# Patient Record
Sex: Female | Born: 1990
Health system: Southern US, Community
[De-identification: ages and names within clinical notes are randomized; demographics above are authoritative.]

## PROBLEM LIST (undated history)

## (undated) ENCOUNTER — Inpatient Hospital Stay (HOSPITAL_COMMUNITY): Payer: Self-pay

## (undated) ENCOUNTER — Emergency Department (HOSPITAL_COMMUNITY): Payer: Medicaid Other | Source: Home / Self Care

## (undated) DIAGNOSIS — K912 Postsurgical malabsorption, not elsewhere classified: Secondary | ICD-10-CM

## (undated) DIAGNOSIS — G8929 Other chronic pain: Secondary | ICD-10-CM

## (undated) DIAGNOSIS — Z9119 Patient's noncompliance with other medical treatment and regimen: Secondary | ICD-10-CM

## (undated) DIAGNOSIS — Z91199 Patient's noncompliance with other medical treatment and regimen due to unspecified reason: Secondary | ICD-10-CM

## (undated) DIAGNOSIS — R351 Nocturia: Secondary | ICD-10-CM

## (undated) DIAGNOSIS — G44319 Acute post-traumatic headache, not intractable: Secondary | ICD-10-CM

## (undated) DIAGNOSIS — I509 Heart failure, unspecified: Secondary | ICD-10-CM

## (undated) DIAGNOSIS — Z903 Acquired absence of stomach [part of]: Secondary | ICD-10-CM

## (undated) DIAGNOSIS — M549 Dorsalgia, unspecified: Secondary | ICD-10-CM

## (undated) HISTORY — PX: TONSILLECTOMY: SUR1361

## (undated) HISTORY — PX: CHOLECYSTECTOMY: SHX55

## (undated) HISTORY — PX: WISDOM TOOTH EXTRACTION: SHX21

## (undated) HISTORY — DX: Acute post-traumatic headache, not intractable: G44.319

## (undated) HISTORY — PX: BREAST ENHANCEMENT SURGERY: SHX7

## (undated) HISTORY — PX: TEAR DUCT PROBING: SHX793

---
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
## 2009-03-07 ENCOUNTER — Emergency Department (HOSPITAL_COMMUNITY): Admission: EM | Admit: 2009-03-07 | Discharge: 2009-03-07 | Payer: Self-pay | Admitting: Emergency Medicine

## 2009-06-28 ENCOUNTER — Ambulatory Visit: Payer: Self-pay | Admitting: Family Medicine

## 2009-07-04 ENCOUNTER — Ambulatory Visit (HOSPITAL_COMMUNITY): Admission: RE | Admit: 2009-07-04 | Discharge: 2009-07-04 | Payer: Self-pay | Admitting: Surgery

## 2009-07-04 ENCOUNTER — Encounter (INDEPENDENT_AMBULATORY_CARE_PROVIDER_SITE_OTHER): Payer: Self-pay | Admitting: Surgery

## 2009-08-01 ENCOUNTER — Encounter (INDEPENDENT_AMBULATORY_CARE_PROVIDER_SITE_OTHER): Payer: Self-pay | Admitting: Internal Medicine

## 2009-08-01 ENCOUNTER — Ambulatory Visit: Payer: Self-pay | Admitting: Family Medicine

## 2009-08-01 LAB — CONVERTED CEMR LAB: Chlamydia, DNA Probe: NEGATIVE

## 2009-11-16 ENCOUNTER — Emergency Department (HOSPITAL_COMMUNITY): Admission: EM | Admit: 2009-11-16 | Discharge: 2009-11-17 | Payer: Self-pay | Admitting: Emergency Medicine

## 2009-12-14 DIAGNOSIS — I509 Heart failure, unspecified: Secondary | ICD-10-CM | POA: Insufficient documentation

## 2009-12-14 HISTORY — DX: Heart failure, unspecified: I50.9

## 2010-01-01 ENCOUNTER — Emergency Department (HOSPITAL_COMMUNITY): Admission: EM | Admit: 2010-01-01 | Discharge: 2010-01-02 | Payer: Self-pay | Admitting: Emergency Medicine

## 2010-07-07 ENCOUNTER — Emergency Department (HOSPITAL_COMMUNITY): Admission: EM | Admit: 2010-07-07 | Discharge: 2010-07-07 | Payer: Self-pay | Admitting: Emergency Medicine

## 2010-08-12 ENCOUNTER — Emergency Department (HOSPITAL_COMMUNITY): Admission: EM | Admit: 2010-08-12 | Discharge: 2010-08-13 | Payer: Self-pay | Admitting: Emergency Medicine

## 2010-11-12 ENCOUNTER — Emergency Department (HOSPITAL_BASED_OUTPATIENT_CLINIC_OR_DEPARTMENT_OTHER): Admission: EM | Admit: 2010-11-12 | Discharge: 2010-11-12 | Payer: Self-pay | Admitting: Emergency Medicine

## 2010-11-12 ENCOUNTER — Ambulatory Visit: Payer: Self-pay | Admitting: Interventional Radiology

## 2011-01-28 ENCOUNTER — Emergency Department (HOSPITAL_COMMUNITY): Payer: Self-pay

## 2011-01-28 ENCOUNTER — Emergency Department (HOSPITAL_COMMUNITY)
Admission: EM | Admit: 2011-01-28 | Discharge: 2011-01-28 | Discharge status: Against Medical Advice | Payer: Self-pay | Attending: Emergency Medicine | Admitting: Emergency Medicine

## 2011-01-28 ENCOUNTER — Encounter (HOSPITAL_COMMUNITY): Payer: Self-pay | Admitting: Radiology

## 2011-01-28 DIAGNOSIS — M7989 Other specified soft tissue disorders: Secondary | ICD-10-CM | POA: Insufficient documentation

## 2011-01-28 DIAGNOSIS — R0602 Shortness of breath: Secondary | ICD-10-CM | POA: Insufficient documentation

## 2011-01-28 DIAGNOSIS — R0789 Other chest pain: Secondary | ICD-10-CM | POA: Insufficient documentation

## 2011-01-28 DIAGNOSIS — J45909 Unspecified asthma, uncomplicated: Secondary | ICD-10-CM | POA: Insufficient documentation

## 2011-01-28 DIAGNOSIS — I509 Heart failure, unspecified: Secondary | ICD-10-CM | POA: Insufficient documentation

## 2011-01-28 LAB — POCT I-STAT, CHEM 8
Creatinine, Ser: 0.7 mg/dL (ref 0.4–1.2)
Glucose, Bld: 87 mg/dL (ref 70–99)
HCT: 40 % (ref 36.0–46.0)
Hemoglobin: 13.6 g/dL (ref 12.0–15.0)
Potassium: 3.4 mEq/L — ABNORMAL LOW (ref 3.5–5.1)
Sodium: 142 mEq/L (ref 135–145)
TCO2: 24 mmol/L (ref 0–100)

## 2011-01-28 LAB — BRAIN NATRIURETIC PEPTIDE: Pro B Natriuretic peptide (BNP): <30 pg/mL (ref 0.0–100.0)

## 2011-01-29 ENCOUNTER — Emergency Department (HOSPITAL_BASED_OUTPATIENT_CLINIC_OR_DEPARTMENT_OTHER)
Admission: EM | Admit: 2011-01-29 | Discharge: 2011-01-29 | Disposition: A | Discharge status: Home / Self Care | Payer: Self-pay | Attending: Emergency Medicine | Admitting: Emergency Medicine

## 2011-01-29 ENCOUNTER — Emergency Department (INDEPENDENT_AMBULATORY_CARE_PROVIDER_SITE_OTHER): Payer: Self-pay

## 2011-01-29 DIAGNOSIS — R0602 Shortness of breath: Secondary | ICD-10-CM | POA: Insufficient documentation

## 2011-01-29 DIAGNOSIS — J45909 Unspecified asthma, uncomplicated: Secondary | ICD-10-CM | POA: Insufficient documentation

## 2011-01-29 DIAGNOSIS — R071 Chest pain on breathing: Secondary | ICD-10-CM

## 2011-01-29 DIAGNOSIS — R079 Chest pain, unspecified: Secondary | ICD-10-CM | POA: Insufficient documentation

## 2011-01-29 DIAGNOSIS — R51 Headache: Secondary | ICD-10-CM | POA: Insufficient documentation

## 2011-01-29 DIAGNOSIS — I509 Heart failure, unspecified: Secondary | ICD-10-CM | POA: Insufficient documentation

## 2011-01-29 LAB — PREGNANCY, URINE: Preg Test, Ur: NEGATIVE

## 2011-01-29 MED ORDER — IOHEXOL 350 MG/ML SOLN
80.0000 mL | Freq: Once | INTRAVENOUS | Status: AC | PRN
  Administered 2011-01-29: 80 mL via INTRAVENOUS

## 2011-02-06 ENCOUNTER — Emergency Department (HOSPITAL_BASED_OUTPATIENT_CLINIC_OR_DEPARTMENT_OTHER)
Admission: EM | Admit: 2011-02-06 | Discharge: 2011-02-06 | Disposition: A | Discharge status: Home / Self Care | Payer: Self-pay | Attending: Emergency Medicine | Admitting: Emergency Medicine

## 2011-02-06 DIAGNOSIS — I509 Heart failure, unspecified: Secondary | ICD-10-CM | POA: Insufficient documentation

## 2011-02-06 DIAGNOSIS — N390 Urinary tract infection, site not specified: Secondary | ICD-10-CM | POA: Insufficient documentation

## 2011-02-06 DIAGNOSIS — M545 Low back pain, unspecified: Secondary | ICD-10-CM | POA: Insufficient documentation

## 2011-02-06 DIAGNOSIS — Z79899 Other long term (current) drug therapy: Secondary | ICD-10-CM | POA: Insufficient documentation

## 2011-02-06 DIAGNOSIS — J45909 Unspecified asthma, uncomplicated: Secondary | ICD-10-CM | POA: Insufficient documentation

## 2011-02-06 LAB — URINE MICROSCOPIC-ADD ON

## 2011-02-06 LAB — URINALYSIS, ROUTINE W REFLEX MICROSCOPIC
Bilirubin Urine: NEGATIVE
Specific Gravity, Urine: 1.013 (ref 1.005–1.030)
Urine Glucose, Fasting: NEGATIVE mg/dL
Urobilinogen, UA: 1 mg/dL (ref 0.0–1.0)

## 2011-02-25 LAB — BASIC METABOLIC PANEL
Calcium: 8.5 mg/dL (ref 8.4–10.5)
Creatinine, Ser: 0.5 mg/dL (ref 0.4–1.2)
GFR calc Af Amer: >60 mL/min (ref >60)
GFR calc non Af Amer: >60 mL/min (ref >60)
Sodium: 143 mEq/L (ref 135–145)

## 2011-02-25 LAB — DIFFERENTIAL
Eosinophils Absolute: 0.1 10*3/uL (ref 0.0–0.7)
Lymphocytes Relative: 28 % (ref 12–46)
Lymphs Abs: 2.4 10*3/uL (ref 0.7–4.0)
Monocytes Relative: 7 % (ref 3–12)
Neutro Abs: 5.5 10*3/uL (ref 1.7–7.7)
Neutrophils Relative %: 64 % (ref 43–77)

## 2011-02-25 LAB — CBC
Hemoglobin: 12.5 g/dL (ref 12.0–15.0)
Platelets: 272 10*3/uL (ref 150–400)
RBC: 4.22 MIL/uL (ref 3.87–5.11)
WBC: 8.6 10*3/uL (ref 4.0–10.5)

## 2011-02-25 LAB — D-DIMER, QUANTITATIVE: D-Dimer, Quant: 0.26 ug/mL-FEU (ref 0.00–0.48)

## 2011-03-01 LAB — URINALYSIS, ROUTINE W REFLEX MICROSCOPIC
Ketones, ur: 15 mg/dL — AB
Nitrite: NEGATIVE
pH: 6 (ref 5.0–8.0)

## 2011-03-01 LAB — POCT I-STAT, CHEM 8
Calcium, Ion: 1.08 mmol/L — ABNORMAL LOW (ref 1.12–1.32)
Chloride: 108 mEq/L (ref 96–112)
HCT: 34 % — ABNORMAL LOW (ref 36.0–46.0)
Sodium: 137 mEq/L (ref 135–145)
TCO2: 20 mmol/L (ref 0–100)

## 2011-03-17 LAB — COMPREHENSIVE METABOLIC PANEL WITH GFR
ALT: 16 U/L (ref 0–35)
AST: 17 U/L (ref 0–37)
Albumin: 3.1 g/dL — ABNORMAL LOW (ref 3.5–5.2)
Alkaline Phosphatase: 58 U/L (ref 39–117)
BUN: 4 mg/dL — ABNORMAL LOW (ref 6–23)
CO2: 21 meq/L (ref 19–32)
Calcium: 8.7 mg/dL (ref 8.4–10.5)
Chloride: 109 meq/L (ref 96–112)
Creatinine, Ser: 0.42 mg/dL (ref 0.4–1.2)
GFR calc non Af Amer: >60 mL/min
Glucose, Bld: 96 mg/dL (ref 70–99)
Potassium: 3.8 meq/L (ref 3.5–5.1)
Sodium: 137 meq/L (ref 135–145)
Total Bilirubin: 0.6 mg/dL (ref 0.3–1.2)
Total Protein: 6 g/dL (ref 6.0–8.3)

## 2011-03-17 LAB — SAMPLE TO BLOOD BANK

## 2011-03-17 LAB — URINALYSIS, ROUTINE W REFLEX MICROSCOPIC
Ketones, ur: NEGATIVE mg/dL
Leukocytes, UA: NEGATIVE
Nitrite: NEGATIVE
Protein, ur: NEGATIVE mg/dL
pH: 7.5 (ref 5.0–8.0)

## 2011-03-17 LAB — DIFFERENTIAL
Basophils Absolute: 0 10*3/uL (ref 0.0–0.1)
Basophils Relative: 0 % (ref 0–1)
Eosinophils Absolute: 0.1 10*3/uL (ref 0.0–0.7)
Eosinophils Relative: 1 % (ref 0–5)
Monocytes Absolute: 0.8 10*3/uL (ref 0.1–1.0)
Monocytes Relative: 8 % (ref 3–12)
Neutro Abs: 6.6 10*3/uL (ref 1.7–7.7)

## 2011-03-17 LAB — CBC
MCHC: 34.5 g/dL (ref 30.0–36.0)
MCV: 91.8 fL (ref 78.0–100.0)
Platelets: 247 10*3/uL (ref 150–400)

## 2011-03-17 LAB — ABO/RH: ABO/RH(D): A POS

## 2011-03-22 LAB — PREGNANCY, URINE: Preg Test, Ur: NEGATIVE

## 2011-03-22 LAB — HEMOGLOBIN AND HEMATOCRIT, BLOOD: HCT: 41.3 % (ref 36.0–46.0)

## 2011-03-26 LAB — URINALYSIS, ROUTINE W REFLEX MICROSCOPIC
Bilirubin Urine: NEGATIVE
Nitrite: NEGATIVE
Specific Gravity, Urine: 1.029 (ref 1.005–1.030)
Urobilinogen, UA: 1 mg/dL (ref 0.0–1.0)
pH: 7 (ref 5.0–8.0)

## 2011-03-26 LAB — CBC
HCT: 39.9 % (ref 36.0–46.0)
Hemoglobin: 13.8 g/dL (ref 12.0–15.0)
MCV: 89.8 fL (ref 78.0–100.0)
WBC: 12 10*3/uL — ABNORMAL HIGH (ref 4.0–10.5)

## 2011-03-26 LAB — HEPATIC FUNCTION PANEL
ALT: 16 U/L (ref 0–35)
AST: 16 U/L (ref 0–37)
Alkaline Phosphatase: 76 U/L (ref 39–117)
Bilirubin, Direct: 0.2 mg/dL (ref 0.0–0.3)
Total Bilirubin: 0.5 mg/dL (ref 0.3–1.2)

## 2011-03-26 LAB — DIFFERENTIAL
Eosinophils Relative: 1 % (ref 0–5)
Lymphocytes Relative: 20 % (ref 12–46)
Lymphs Abs: 2.4 10*3/uL (ref 0.7–4.0)
Monocytes Absolute: 0.9 10*3/uL (ref 0.1–1.0)

## 2011-03-26 LAB — RAPID URINE DRUG SCREEN, HOSP PERFORMED
Amphetamines: NOT DETECTED
Barbiturates: NOT DETECTED
Tetrahydrocannabinol: NOT DETECTED

## 2011-03-26 LAB — BASIC METABOLIC PANEL
Chloride: 107 mEq/L (ref 96–112)
GFR calc non Af Amer: >60 mL/min (ref >60)
Glucose, Bld: 108 mg/dL — ABNORMAL HIGH (ref 70–99)
Potassium: 3.7 mEq/L (ref 3.5–5.1)
Sodium: 138 mEq/L (ref 135–145)

## 2011-04-28 NOTE — Op Note (Signed)
[name], Brenda Spencer                ACCOUNT NO.:  [id number]   MEDICAL RECORD NO.:  [id number]          PATIENT TYPE:  AMB   LOCATION:  DSC                          FACILITY:  [facility]   PHYSICIAN:  David L. Annalee Genta, M.D.DATE OF BIRTH:  01-12-1991   DATE OF PROCEDURE:  12/31/2008  DATE OF DISCHARGE:                               OPERATIVE REPORT   PREOPERATIVE DIAGNOSES:  1. Recurrent tonsillitis.  2. Adenotonsillar hypertrophy.   POSTOPERATIVE DIAGNOSES:  1. Recurrent tonsillitis.  2. Adenotonsillar hypertrophy.   INDICATIONS FOR SURGERY:  1. Recurrent tonsillitis.  2. Adenotonsillar hypertrophy.   SURGICAL PROCEDURES:  Tonsillectomy and adenoidectomy.   SURGEON:  Kinnie Scales. Annalee Genta, MD   ANESTHESIA:  General endotracheal.   COMPLICATIONS:  None.   ESTIMATED BLOOD LOSS:  Minimal.   DISPOSITION:  The patient was transferred from the operating room to the  recovery room in stable condition.   BRIEF HISTORY:  The patient is an almost 20 year old white female who is  referred for evaluation of recurrent tonsillitis and adenotonsillar  hypertrophy.  The patient has had multiple episodes of infection  requiring antibiotic therapy and has tonsil discharge and sore throat.  Examination in the office revealed 3+ cryptic tonsils, and given her  history and examination, I recommended the above surgical procedures.  The risks, benefits, and possible complications of the procedures were  discussed in detail with the patient and her mother.  They understood  and concurred to our plan for surgery, which is scheduled as an  outpatient under general anesthesia on December 31, 2008.   PROCEDURE:  The patient was brought to the operating room at [facility], placed in a supine position on the  operating table.  General endotracheal anesthesia was established  without difficulty.  The patient was positioned on the operating table,  prepped and draped in a  sterile fashion.  A Crowe-Davis mouthgag was  inserted out difficulty, and the oral cavity and oropharynx were  examined.  There were no loose or broken teeth, and the hard and soft  palate were intact.  Procedure was begun with adenoid ablation using  Bovie suction cautery set at 45 watts.  Adenoid tissue was completely  ablated creating a widely patent nasopharynx.  Attention was then turned  to the tonsils.  Dissecting in a subcapsular fashion, the left tonsil  was resected using Bovie electrocautery and dissecting the subcapsular  fascia from superior pole to tongue base.  Tonsil tissue was sent to  pathology.  Right tonsil was removed in a similar fashion.  There was no  bleeding.  The tonsillar fossae were gently debrided with a dry tonsil  sponge and several small areas of point hemorrhage were cauterized with  suction cautery.  Crowe-Davis mouthgag was released and re-applied.  There was no active bleeding.  An orogastric tube was passed.  The  stomach contents were aspirated.  Nasal cavity, nasopharynx, oral  cavity, and oropharynx were irrigated and suctioned.  The patient's  Crowe-Davis mouthgag was released and removed.  No loose or broken  teeth.  No bleeding.  She was extubated and transferred from the  operating room to the recovery room in stable condition.  There were no  complications.  The blood loss was minimal.           ______________________________  Kinnie Scales. Annalee Genta, M.D.     DLS/MEDQ  D:  16/09/9603  T:  12/31/2008  Job:  [id number]

## 2011-04-28 NOTE — Op Note (Signed)
Brenda Spencer, Brenda Spencer                ACCOUNT NO.:  [id number]   MEDICAL RECORD NO.:  [id number]          PATIENT TYPE:  AMB   LOCATION:  DAY                          FACILITY:  [facility]   PHYSICIAN:  Wilmon Arms. Corliss Skains, M.D. DATE OF BIRTH:  25-Aug-1991   DATE OF PROCEDURE:  07/04/2009  DATE OF DISCHARGE:                               OPERATIVE REPORT   PREOPERATIVE DIAGNOSIS:  Chronic calculus cholecystitis.   POSTOPERATIVE DIAGNOSIS:  Chronic calculus cholecystitis.   PROCEDURE PERFORMED:  Laparoscopic cholecystectomy with intraoperative  cholangiogram.   SURGEON:  Wilmon Arms. Tsuei, M.D.   ANESTHESIA:  General   INDICATIONS:  This is a 20 year old female who presents with a 45-month  history of intermittent right upper quadrant pain radiating through to  her back associated with nausea and vomiting.  A workup included liver  function tests which were normal.  Ultrasound showed gallstones, but no  sign of cholecystitis or common bile duct dilatation.  She presents now  for elective cholecystectomy.   DESCRIPTION OF PROCEDURE:  The patient was brought to the operating room  and placed supine position on the operating room table.  After an  adequate level of general anesthesia was obtained the patient's abdomen  was prepped with Betadine and draped in sterile fashion.  A time-out was  taken to ensure the proper patient and the proper procedure.  The area  below her umbilicus was infiltrated with 0.25% Marcaine with  epinephrine.  A transverse incision was made.  Dissection was carried  down through a considerable amount of adipose tissue to the fascia.  The  fascia was opened vertically.  We entered the peritoneal cavity bluntly.  A stay suture of zero Vicryl was placed around the fascial opening.  Pneumoperitoneum was obtained by insufflating CO2 and maintaining  maximal pressure of 15 mmHg.  The patient was positioned in reverse  Trendelenburg and tilted to her left.  An 11 mm port was  placed in the  subxiphoid position.  Two 5 mm ports were placed in the right upper  quadrant.  The liver appeared grossly normal.  The gallbladder was  mildly thickened and was very long and thin.  The fundus of the  gallbladder was grasped with a clamp and elevated.  We opened the  peritoneum around the hilum of the gallbladder.  We bluntly dissected  around the cystic duct.  The cystic duct appeared rather dilated.  We  were very meticulous in our dissection and were able to visualize the  cystic duct and cystic artery leaving the gallbladder.  Once we were  confident that this was indeed the cystic duct we ligated it with a clip  distally.  A small opening was created on the cystic duct.  A Cook  cholangiogram catheter was inserted through a stab incision threaded  into the cystic duct.  A cholangiogram was then obtained which showed  good flow proximally and distally to the biliary tree with no sign of  filling defect.  Contrast flowed easily into the duodenum.  The catheter  was removed.  The cystic duct was ligated with  two clips and a zero  Vicryl Endoloop.  The duct was divided and the cystic artery was also  ligated with clips and divided.  Cautery was then used to remove the  gallbladder from the liver.  A small opening was inadvertently made in  the very top of the fundus of the gallbladder, but no stones were  spilled.  The gallbladder was placed in an EndoCatch sac.  We irrigated  the gallbladder fossa.  Hemostasis was obtained with cautery.  We  suctioned out the irrigation.  The gallbladder was then removed through  the umbilical port site.  The fascia was closed with the  pursestring suture.  Pneumoperitoneum was then released as we removed  the trocars.  The 4-0 Monocryl was used to close the skin incisions.  Steri-Strips and clean dressings were applied.  The patient was then  extubated and brought to recovery in stable condition.  All sponge,  instrument and needle  counts were correct.      Wilmon Arms. Tsuei, M.D.  Electronically Signed     MKT/MEDQ  D:  07/04/2009  T:  07/04/2009  Job:  [id number]

## 2011-09-30 ENCOUNTER — Emergency Department (HOSPITAL_COMMUNITY): Payer: Self-pay

## 2011-09-30 ENCOUNTER — Emergency Department (HOSPITAL_COMMUNITY)
Admission: EM | Admit: 2011-09-30 | Discharge: 2011-09-30 | Disposition: A | Discharge status: Home / Self Care | Payer: Self-pay | Attending: Emergency Medicine | Admitting: Emergency Medicine

## 2011-09-30 DIAGNOSIS — I509 Heart failure, unspecified: Secondary | ICD-10-CM | POA: Insufficient documentation

## 2011-09-30 DIAGNOSIS — J45909 Unspecified asthma, uncomplicated: Secondary | ICD-10-CM | POA: Insufficient documentation

## 2011-09-30 DIAGNOSIS — R112 Nausea with vomiting, unspecified: Secondary | ICD-10-CM | POA: Insufficient documentation

## 2011-09-30 DIAGNOSIS — G43909 Migraine, unspecified, not intractable, without status migrainosus: Secondary | ICD-10-CM | POA: Insufficient documentation

## 2011-09-30 DIAGNOSIS — H53149 Visual discomfort, unspecified: Secondary | ICD-10-CM | POA: Insufficient documentation

## 2011-11-17 ENCOUNTER — Encounter (HOSPITAL_BASED_OUTPATIENT_CLINIC_OR_DEPARTMENT_OTHER): Payer: Self-pay | Admitting: *Deleted

## 2011-11-17 ENCOUNTER — Emergency Department (HOSPITAL_BASED_OUTPATIENT_CLINIC_OR_DEPARTMENT_OTHER)
Admission: EM | Admit: 2011-11-17 | Discharge: 2011-11-17 | Disposition: A | Discharge status: Home / Self Care | Payer: Self-pay | Attending: Emergency Medicine | Admitting: Emergency Medicine

## 2011-11-17 DIAGNOSIS — I509 Heart failure, unspecified: Secondary | ICD-10-CM | POA: Insufficient documentation

## 2011-11-17 DIAGNOSIS — J069 Acute upper respiratory infection, unspecified: Secondary | ICD-10-CM | POA: Insufficient documentation

## 2011-11-17 DIAGNOSIS — J45909 Unspecified asthma, uncomplicated: Secondary | ICD-10-CM | POA: Insufficient documentation

## 2011-11-17 DIAGNOSIS — J40 Bronchitis, not specified as acute or chronic: Secondary | ICD-10-CM | POA: Insufficient documentation

## 2011-11-17 HISTORY — DX: Heart failure, unspecified: I50.9

## 2011-11-17 MED ORDER — AZITHROMYCIN 250 MG PO TABS: 250.0000 mg | ORAL_TABLET | Freq: Every day | ORAL | Status: AC

## 2011-11-17 MED ORDER — ALBUTEROL SULFATE HFA 108 (90 BASE) MCG/ACT IN AERS
INHALATION_SPRAY | RESPIRATORY_TRACT | Status: AC
  Filled 2011-11-17: qty 6.7

## 2011-11-17 MED ORDER — OXYCODONE-ACETAMINOPHEN 5-325 MG PO TABS
1.0000 | ORAL_TABLET | Freq: Once | ORAL | Status: AC
  Administered 2011-11-17: 1 via ORAL
  Filled 2011-11-17: qty 1

## 2011-11-17 MED ORDER — ALBUTEROL SULFATE HFA 108 (90 BASE) MCG/ACT IN AERS
2.0000 | INHALATION_SPRAY | RESPIRATORY_TRACT | Status: DC
  Administered 2011-11-17: 10:00:00 via RESPIRATORY_TRACT

## 2011-11-17 NOTE — ED Provider Notes (Signed)
History     CSN: [id number] Arrival date & time: 11/17/2011  9:02 AM   First MD Initiated Contact with Patient 11/17/11 [phone]      Chief Complaint  Patient presents with  . Cough    (Consider location/radiation/quality/duration/timing/severity/associated sxs/prior treatment) Patient is a 20 y.o. female presenting with cough. The history is provided by the patient.  Cough   the patient reports 24 hours of productive cough without shortness of breath.  She reports headache and nasal congestion.  She's had recent sick contacts.  Today she had an episode of posttussive emesis.  She has no nausea at this time.  She does report 3 weeks of loose stools.  She has not had a recent travel outside the [place] or recent antibiotics.  She does not smoke cigarettes.  She's had no orthopnea or dyspnea on exertion.  Does have a history of asthma.  She reports this feels slightly different than her asthma.  She has an albuterol inhaler which has not been using it.  Nothing worsens her symptoms.  Nothing improves her symptoms the  Past Medical History  Diagnosis Date  . CHF (congestive heart failure)   . Congestive heart failure 2011    related to preeclampsia  . Asthma     Past Surgical History  Procedure Date  . Tonsillectomy   . Wisdom tooth extraction   . Tear duct probing   . Cholecystectomy     No family history on file.  History  Substance Use Topics  . Smoking status: Never Smoker   . Smokeless tobacco: Not on file  . Alcohol Use: No    OB History    Grav Para Term Preterm Abortions TAB SAB Ect Mult Living                  Review of Systems  Respiratory: Positive for cough.   All other systems reviewed and are negative.    Allergies  Review of patient's allergies indicates no known allergies.  Home Medications   Current Outpatient Rx  Name Route Sig Dispense Refill  . ALBUTEROL SULFATE HFA 108 (90 BASE) MCG/ACT IN AERS Inhalation Inhale 2 puffs into the lungs  every 6 (six) hours as needed.        BP 127/84  Pulse 124  Temp(Src) 98.6 F (37 C) (Oral)  Resp 20  Ht 5\' 1"  (1.549 m)  Wt 180 lb (81.647 kg)  BMI 34.01 kg/m2  SpO2 100%  LMP 11/11/2011  Physical Exam  Nursing note and vitals reviewed. Constitutional: She is oriented to person, place, and time. She appears well-developed and well-nourished. No distress.  HENT:  Head: Normocephalic and atraumatic.  Eyes: EOM are normal.  Neck: Normal range of motion.  Cardiovascular: Normal rate, regular rhythm and normal heart sounds.   Pulmonary/Chest: Effort normal and breath sounds normal.  Abdominal: Soft. She exhibits no distension. There is no tenderness.  Musculoskeletal: Normal range of motion.  Neurological: She is alert and oriented to person, place, and time.  Skin: Skin is warm and dry.  Psychiatric: She has a normal mood and affect. Judgment normal.    ED Course  Procedures (including critical care time)  Labs Reviewed - No data to display No results found.   1. Upper respiratory tract infection   2. Bronchitis       MDM  Symptoms sound consistent with an upper respiratory tract infection/bronchitis.  Will treat with a Z-Pak as well as with MDI albuterol for the  cough.  She has no abdominal pain at this time.  Her episode of vomiting appears to be posttussive emesis.  DC home with close followup with her primary care doctor.  She has no vaginal complaints or lower abdominal pain.  She's requested a serum pregnancy test today. there is not any indication for this in the emergency department today and she will be referred to that her store to obtain one herself.   9:24 AM On my examination her pulse rate is 104.  Her abdomen is benign.  The lungs are clear.  My suspicion for pulmonary embolism is very low.  I suspect this is mild volume depletion      Lyanne Co, MD 11/17/11 [phone]

## 2011-11-17 NOTE — ED Notes (Signed)
Patient states she developed a cough yesterday and is now coughing so hard she is vomiting , requesting a pregnancy test and is having clear diarrhea for the last 3 weeks.  Appetite is normal.

## 2012-02-29 ENCOUNTER — Encounter (HOSPITAL_BASED_OUTPATIENT_CLINIC_OR_DEPARTMENT_OTHER): Payer: Self-pay | Admitting: *Deleted

## 2012-02-29 ENCOUNTER — Encounter (HOSPITAL_COMMUNITY): Payer: Self-pay

## 2012-02-29 ENCOUNTER — Emergency Department (HOSPITAL_BASED_OUTPATIENT_CLINIC_OR_DEPARTMENT_OTHER)
Admission: EM | Admit: 2012-02-29 | Discharge: 2012-02-29 | Disposition: A | Discharge status: Home / Self Care | Payer: Medicaid Other | Attending: Emergency Medicine | Admitting: Emergency Medicine

## 2012-02-29 ENCOUNTER — Inpatient Hospital Stay (HOSPITAL_COMMUNITY): Payer: Medicaid Other

## 2012-02-29 ENCOUNTER — Inpatient Hospital Stay (HOSPITAL_COMMUNITY)
Admission: AD | Admit: 2012-02-29 | Discharge: 2012-02-29 | Disposition: A | Discharge status: Home / Self Care | Payer: Medicaid Other | Source: Ambulatory Visit | Attending: Obstetrics & Gynecology | Admitting: Obstetrics & Gynecology

## 2012-02-29 DIAGNOSIS — R1110 Vomiting, unspecified: Secondary | ICD-10-CM

## 2012-02-29 DIAGNOSIS — O219 Vomiting of pregnancy, unspecified: Secondary | ICD-10-CM

## 2012-02-29 DIAGNOSIS — R10819 Abdominal tenderness, unspecified site: Secondary | ICD-10-CM | POA: Insufficient documentation

## 2012-02-29 DIAGNOSIS — O210 Mild hyperemesis gravidarum: Secondary | ICD-10-CM | POA: Insufficient documentation

## 2012-02-29 DIAGNOSIS — O2390 Unspecified genitourinary tract infection in pregnancy, unspecified trimester: Secondary | ICD-10-CM | POA: Insufficient documentation

## 2012-02-29 DIAGNOSIS — N390 Urinary tract infection, site not specified: Secondary | ICD-10-CM | POA: Insufficient documentation

## 2012-02-29 DIAGNOSIS — Z331 Pregnant state, incidental: Secondary | ICD-10-CM

## 2012-02-29 LAB — CBC
MCH: 27.9 pg (ref 26.0–34.0)
MCV: 86.4 fL (ref 78.0–100.0)
Platelets: 281 10*3/uL (ref 150–400)
RDW: 14.2 % (ref 11.5–15.5)

## 2012-02-29 LAB — URINALYSIS, ROUTINE W REFLEX MICROSCOPIC
Bilirubin Urine: NEGATIVE
Glucose, UA: NEGATIVE mg/dL
Hgb urine dipstick: NEGATIVE
Ketones, ur: NEGATIVE mg/dL
pH: 6 (ref 5.0–8.0)

## 2012-02-29 LAB — URINE MICROSCOPIC-ADD ON

## 2012-02-29 MED ORDER — ONDANSETRON HCL 4 MG PO TABS: 4.0000 mg | ORAL_TABLET | Freq: Four times a day (QID) | ORAL | Status: AC

## 2012-02-29 MED ORDER — NITROFURANTOIN MONOHYD MACRO 100 MG PO CAPS: 100.0000 mg | ORAL_CAPSULE | Freq: Two times a day (BID) | ORAL | Status: AC

## 2012-02-29 MED ORDER — PRENATAL COMPLETE 14-0.4 MG PO TABS: 1.0000 | ORAL_TABLET | Freq: Every day | ORAL | Status: DC

## 2012-02-29 MED ORDER — SODIUM CHLORIDE 0.9 % IV BOLUS (SEPSIS)
1000.0000 mL | Freq: Once | INTRAVENOUS | Status: AC
  Administered 2012-02-29: 1000 mL via INTRAVENOUS

## 2012-02-29 MED ORDER — ONDANSETRON HCL 4 MG/2ML IJ SOLN
4.0000 mg | Freq: Once | INTRAMUSCULAR | Status: AC
  Administered 2012-02-29: 4 mg via INTRAVENOUS
  Filled 2012-02-29: qty 2

## 2012-02-29 MED ORDER — PANTOPRAZOLE SODIUM 40 MG IV SOLR
40.0000 mg | Freq: Once | INTRAVENOUS | Status: AC
  Administered 2012-02-29: 40 mg via INTRAVENOUS
  Filled 2012-02-29: qty 40

## 2012-02-29 NOTE — ED Notes (Signed)
Pt reports her daughter has had GI bug with vomiting and diarrhea and pt woke up this am with one episode of nausea and vomiting at 0300 this am no more emesis

## 2012-02-29 NOTE — Discharge Instructions (Signed)
Nausea and Vomiting Nausea is a sick feeling that often comes before throwing up (vomiting). Vomiting is a reflex where stomach contents come out of your mouth. Vomiting can cause severe loss of body fluids (dehydration). Children and elderly adults can become dehydrated quickly, especially if they also have diarrhea. Nausea and vomiting are symptoms of a condition or disease. It is important to find the cause of your symptoms. CAUSES   Direct irritation of the stomach lining. This irritation can result from increased acid production (gastroesophageal reflux disease), infection, food poisoning, taking certain medicines (such as nonsteroidal anti-inflammatory drugs), alcohol use, or tobacco use.   Signals from the brain.These signals could be caused by a headache, heat exposure, an inner ear disturbance, increased pressure in the brain from injury, infection, a tumor, or a concussion, pain, emotional stimulus, or metabolic problems.   An obstruction in the gastrointestinal tract (bowel obstruction).   Illnesses such as diabetes, hepatitis, gallbladder problems, appendicitis, kidney problems, cancer, sepsis, atypical symptoms of a heart attack, or eating disorders.   Medical treatments such as chemotherapy and radiation.   Receiving medicine that makes you sleep (general anesthetic) during surgery.  DIAGNOSIS Your caregiver may ask for tests to be done if the problems do not improve after a few days. Tests may also be done if symptoms are severe or if the reason for the nausea and vomiting is not clear. Tests may include:  Urine tests.   Blood tests.   Stool tests.   Cultures (to look for evidence of infection).   X-rays or other imaging studies.  Test results can help your caregiver make decisions about treatment or the need for additional tests. TREATMENT You need to stay well hydrated. Drink frequently but in small amounts.You may wish to drink water, sports drinks, clear broth, or  eat frozen ice pops or gelatin dessert to help stay hydrated.When you eat, eating slowly may help prevent nausea.There are also some antinausea medicines that may help prevent nausea. HOME CARE INSTRUCTIONS   Take all medicine as directed by your caregiver.   If you do not have an appetite, do not force yourself to eat. However, you must continue to drink fluids.   If you have an appetite, eat a normal diet unless your caregiver tells you differently.   Eat a variety of complex carbohydrates (rice, wheat, potatoes, bread), lean meats, yogurt, fruits, and vegetables.   Avoid high-fat foods because they are more difficult to digest.   Drink enough water and fluids to keep your urine clear or pale yellow.   If you are dehydrated, ask your caregiver for specific rehydration instructions. Signs of dehydration may include:   Severe thirst.   Dry lips and mouth.   Dizziness.   Dark urine.   Decreasing urine frequency and amount.   Confusion.   Rapid breathing or pulse.  SEEK IMMEDIATE MEDICAL CARE IF:   You have blood or brown flecks (like coffee grounds) in your vomit.   You have black or bloody stools.   You have a severe headache or stiff neck.   You are confused.   You have severe abdominal pain.   You have chest pain or trouble breathing.   You do not urinate at least once every 8 hours.   You develop cold or clammy skin.   You continue to vomit for longer than 24 to 48 hours.   You have a fever.  MAKE SURE YOU:   Understand these instructions.   Will watch your  condition.   Will get help right away if you are not doing well or get worse.  Document Released: 11/30/2005 Document Revised: 11/19/2011 Document Reviewed: 04/29/2011 [facility] Patient Information 2012 [place], [place].Pregnancy, First Trimester The first trimester is the first 3 months your baby is growing inside you. It is important to follow your doctor's instructions. HOME CARE   Do not smoke.    Do not drink alcohol.   Only take medicine as told by your doctor.   Exercise.   Eat healthy foods. Eat regular, well-balanced meals.   You can have sex (intercourse) if there are no other problems with the pregnancy.   Things that help with morning sickness:   Eat soda crackers before getting up in the morning.   Eat 4 to 5 small meals rather than 3 large meals.   Drink liquids between meals, not during meals.   Go to all appointments as told.   Take all vitamins or supplements as told by your doctor.  GET HELP RIGHT AWAY IF:   You develop a fever.   You have a bad smelling fluid that is leaking from your vagina.   There is bleeding from the vagina.   You develop severe belly (abdominal) or back pain.   You throw up (vomit) blood. It may look like coffee grounds.   You lose more than 2 pounds in a week.   You gain 5 pounds or more in a week.   You gain more than 2 pounds in a week and you see puffiness (swelling) in your feet, ankles, or legs.   You have severe dizziness or pass out (faint).   You are around people who have [place] measles, chickenpox, or fifth disease.   You have a headache, watery poop (diarrhea), pain with peeing (urinating), or cannot breath right.  Document Released: 05/18/2008 Document Revised: 11/19/2011 Document Reviewed: 05/18/2008 [facility] Patient Information 2012 [place], [place].

## 2012-02-29 NOTE — Discharge Instructions (Signed)
Morning Sickness Morning sickness is when you feel sick to your stomach (nauseous) during pregnancy. You may feel sick to your stomach and throw up (vomit). You may feel sick in the morning, but you can feel this way any time of day. Some women feel very sick to their stomach and cannot stop throwing up (hyperemesis gravidarum). HOME CARE  Take multivitamins as told by your doctor. Taking multivitamins before getting pregnant can stop or lessen the harshness of morning sickness.   Eat dry toast or unsalted crackers before getting out of bed.   Eat 5 to 6 small meals a day.   Eat dry and bland foods like rice and baked potatoes.   Do not drink liquids with meals. Drink between meals.   Do not eat greasy, fatty, or spicy foods.   Have someone cook for you if the smell of food causes you to feel sick or throw up.   Do not take vitamins with iron, or as told by your doctor.   Eat protein when you need a snack (nuts, yogurt, cheese).   Eat unsweetened gelatins for dessert.   Wear a bracelet used for sea sickness (acupressure wristband).   Go to a doctor that puts thin needles into certain body points (acupuncture) to improve how you feel.   Do not smoke.   Use a humidifier to keep the air in your house free of odors.  GET HELP RIGHT AWAY IF:   You feel very sick to your stomach and cannot stop throwing up.   You pass out (faint).   You have a fever.   You need medicine to feel better.   You feel dizzy or lightheaded.   You are losing weight.   You need help knowing what to eat and what not to eat.  MAKE SURE YOU:   Understand these instructions.   Will watch your condition.   Will get help right away if you are not doing well or get worse.  Document Released: 01/07/2005 Document Revised: 11/19/2011 Document Reviewed: 02/27/2010 [employer] Patient Information 2012 [employer]. 

## 2012-02-29 NOTE — MAU Note (Signed)
Threw up during the night. When got up to get ready for  Didn't feel good, daughter has been sick so went to the hosp and found out she was preg.  Hx of CHF with first preg.   Pointed out signs through out the hosp regarding visitors with GI symptoms- this applies to child TOO.  Do not bring in visitors or visit when you are sick.

## 2012-02-29 NOTE — ED Provider Notes (Addendum)
History     CSN: [id number]  Arrival date & time 02/29/12  0941   First MD Initiated Contact with Patient 02/29/12 1026      Chief Complaint  Patient presents with  . Emesis    (Consider location/radiation/quality/duration/timing/severity/associated sxs/prior treatment) HPI Comments: Patient complains of nausea and vomiting. This started during the night. Denies any diarrhea. She sore across her upper abdomen. Denies any other abdominal pain. Denies a fevers, chest congestion, or other recent illnesses. She states that her daughter has same symptoms with vomiting and diarrhea. She hasn't tried anything at home, but says the vomiting is continuing.  Patient is a 21 y.o. female presenting with vomiting. The history is provided by the patient.  Emesis  This is a new problem. The current episode started 6 to 12 hours ago. Associated symptoms include abdominal pain. Pertinent negatives include no arthralgias, no chills, no cough, no diarrhea, no fever and no headaches.    Past Medical History  Diagnosis Date  . CHF (congestive heart failure)   . Congestive heart failure 2011    related to preeclampsia  . Asthma     Past Surgical History  Procedure Date  . Tonsillectomy   . Wisdom tooth extraction   . Tear duct probing   . Cholecystectomy     History reviewed. No pertinent family history.  History  Substance Use Topics  . Smoking status: Never Smoker   . Smokeless tobacco: Not on file  . Alcohol Use: No    OB History    Grav Para Term Preterm Abortions TAB SAB Ect Mult Living                  Review of Systems  Constitutional: Negative for fever, chills, diaphoresis and fatigue.  HENT: Negative for congestion, rhinorrhea and sneezing.   Eyes: Negative.   Respiratory: Negative for cough, chest tightness and shortness of breath.   Cardiovascular: Negative for chest pain and leg swelling.  Gastrointestinal: Positive for nausea, vomiting and abdominal pain. Negative  for diarrhea and blood in stool.  Genitourinary: Negative for frequency, hematuria, flank pain and difficulty urinating.  Musculoskeletal: Negative for back pain and arthralgias.  Skin: Negative for rash.  Neurological: Negative for dizziness, speech difficulty, weakness, numbness and headaches.    Allergies  Review of patient's allergies indicates no known allergies.  Home Medications   Current Outpatient Rx  Name Route Sig Dispense Refill  . ALBUTEROL SULFATE HFA 108 (90 BASE) MCG/ACT IN AERS Inhalation Inhale 2 puffs into the lungs every 6 (six) hours as needed.      Marland Kitchen NITROFURANTOIN MONOHYD MACRO 100 MG PO CAPS Oral Take 1 capsule (100 mg total) by mouth 2 (two) times daily. 10 capsule 0  . ONDANSETRON HCL 4 MG PO TABS Oral Take 1 tablet (4 mg total) by mouth every 6 (six) hours. 12 tablet 0  . PRENATAL COMPLETE 14-0.4 MG PO TABS Oral Take 1 tablet by mouth daily. 60 each 0    BP 125/83  Pulse 109  Temp(Src) 98.2 F (36.8 C) (Oral)  Resp 18  Ht 5' (1.524 m)  Wt 160 lb (72.576 kg)  BMI 31.25 kg/m2  SpO2 100%  LMP 02/02/2012  Physical Exam  Constitutional: She is oriented to person, place, and time. She appears well-developed and well-nourished.  HENT:  Head: Normocephalic and atraumatic.  Eyes: Pupils are equal, round, and reactive to light.  Neck: Normal range of motion. Neck supple.  Cardiovascular: Normal rate, regular rhythm and  normal heart sounds.   Pulmonary/Chest: Effort normal and breath sounds normal. No respiratory distress. She has no wheezes. She has no rales. She exhibits no tenderness.  Abdominal: Soft. Bowel sounds are normal. There is tenderness. There is no rebound and no guarding.       Mild tenderness across the upper abdomen  Musculoskeletal: Normal range of motion. She exhibits no edema.  Lymphadenopathy:    She has no cervical adenopathy.  Neurological: She is alert and oriented to person, place, and time.  Skin: Skin is warm and dry. No rash  noted.  Psychiatric: She has a normal mood and affect.    ED Course  Procedures (including critical care time)  Results for orders placed during the hospital encounter of 02/29/12  URINALYSIS, ROUTINE W REFLEX MICROSCOPIC      Component Value Range   Color, Urine YELLOW  YELLOW    APPearance CLOUDY (*) CLEAR    Specific Gravity, Urine 1.026  1.005 - 1.030    pH 6.0  5.0 - 8.0    Glucose, UA NEGATIVE  NEGATIVE (mg/dL)   Hgb urine dipstick NEGATIVE  NEGATIVE    Bilirubin Urine NEGATIVE  NEGATIVE    Ketones, ur NEGATIVE  NEGATIVE (mg/dL)   Protein, ur NEGATIVE  NEGATIVE (mg/dL)   Urobilinogen, UA 1.0  0.0 - 1.0 (mg/dL)   Nitrite NEGATIVE  NEGATIVE    Leukocytes, UA MODERATE (*) NEGATIVE   PREGNANCY, URINE      Component Value Range   Preg Test, Ur POSITIVE (*) NEGATIVE   URINE MICROSCOPIC-ADD ON      Component Value Range   Squamous Epithelial / LPF FEW (*) RARE    WBC, UA 11-20  <3 (WBC/hpf)   RBC / HPF 0-2  <3 (RBC/hpf)   Bacteria, UA MANY (*) RARE    No results found.   No results found.   1. Vomiting   2. Pregnancy as incidental finding   3. UTI (lower urinary tract infection)       MDM  Patient with positive pregnancy test which could be the cause of her vomiting . She currently denies any abdominal pain, vaginal bleeding or discharge. Will go ahead and start her on Zofran, as well as a prenatal vitamin and encouraged her to start prenatal care. She normally sees Dr. Shawnie Pons in [facility], MD 02/29/12 1143  Rolan Bucco, MD 02/29/12 [phone]

## 2012-02-29 NOTE — MAU Provider Note (Signed)
History     CSN: [id number]  Arrival date and time: 02/29/12 1531   None     Chief Complaint  Patient presents with  . Emesis   HPI Brenda Spencer is 21 y.o. G2P0101 [redacted]w[redacted]d weeks presenting after being seen today at [facility] with dx of UTI and + pregnancy test.  She vomited X 1 during the night.  She was not aware she was pregnancy. LMP 02/02/12  Was given Rx for macrobid for UTI.Marland Kitchen  Recently had screening for STI's negative.  She states she is fine and that all she needs is a letter to tell how far along she is so she can apply for Medicaid.  She declines physical examination.  She denies vaginal bleeding and abdominal pain.  She states she is ready to go home.   Past Medical History  Diagnosis Date  . CHF (congestive heart failure)   . Congestive heart failure 2011    related to preeclampsia  . Asthma     Past Surgical History  Procedure Date  . Tonsillectomy   . Wisdom tooth extraction   . Tear duct probing   . Cholecystectomy     No family history on file.  History  Substance Use Topics  . Smoking status: Never Smoker   . Smokeless tobacco: Not on file  . Alcohol Use: No    Allergies: No Known Allergies  Prescriptions prior to admission  Medication Sig Dispense Refill  . albuterol (PROVENTIL HFA;VENTOLIN HFA) 108 (90 BASE) MCG/ACT inhaler Inhale 2 puffs into the lungs every 6 (six) hours as needed.        . nitrofurantoin, macrocrystal-monohydrate, (MACROBID) 100 MG capsule Take 1 capsule (100 mg total) by mouth 2 (two) times daily.  10 capsule  0  . ondansetron (ZOFRAN) 4 MG tablet Take 1 tablet (4 mg total) by mouth every 6 (six) hours.  12 tablet  0  . Prenatal Vit-Fe Fumarate-FA (PRENATAL COMPLETE) 14-0.4 MG TABS Take 1 tablet by mouth daily.  60 each  0    Review of Systems  Constitutional: Negative.   Gastrointestinal: Positive for nausea and vomiting.   Physical Exam   Blood pressure 121/84, pulse 108, temperature 98.8 F (37.1 C),  temperature source Oral, resp. rate 20, height 5\' 1"  (1.549 m), weight 83.371 kg (183 lb 12.8 oz), last menstrual period 02/02/2012, SpO2 100.00%.  Physical Exam  Constitutional: She is oriented to person, place, and time. She appears well-developed and well-nourished. No distress.  HENT:  Head: Normocephalic.  Neck: Normal range of motion.  Respiratory: Effort normal.  GI:       Patient declines exam  Genitourinary:       Patient declines exam  Neurological: She is alert and oriented to person, place, and time.  Skin: Skin is warm and dry.  Psychiatric: She has a normal mood and affect. Her behavior is normal. Judgment and thought content normal.      Results for orders placed during the hospital encounter of 02/29/12 (from the past 24 hour(s))  CBC     Status: Normal   Collection Time   02/29/12  5:15 PM      Component Value Range   WBC 9.2  4.0 - 10.5 (K/uL)   RBC 4.77  3.87 - 5.11 (MIL/uL)   Hemoglobin 13.3  12.0 - 15.0 (g/dL)   HCT 32.4  40.1 - 02.7 (%)   MCV 86.4  78.0 - 100.0 (fL)   MCH 27.9  26.0 -  34.0 (pg)   MCHC 32.3  30.0 - 36.0 (g/dL)   RDW 25.3  66.4 - 40.3 (%)   Platelets 281  150 - 400 (K/uL)  HCG, QUANTITATIVE, PREGNANCY     Status: Abnormal   Collection Time   02/29/12  5:15 PM      Component Value Range   hCG, Beta Chain, Quant, S 248 (*) <5 (mIU/mL)   Clinical Data: Urine pregnancy test was positive. Vomiting.  Gestational age by last menstrual period 3 weeks, 6 days.  OBSTETRIC <14 WK [place] AND TRANSVAGINAL OB US  Technique: Both transabdominal and transvaginal ultrasound  examinations were performed for complete evaluation of the  gestation as well as the maternal uterus, adnexal regions, and  pelvic cul-de-sac.  Findings: Uterus is retroverted. Endometrial stripe measures up to  1.1 cm, without definite double decidual reaction noted, and  without a visualized intrauterine gestational sac.  The right ovary measures 3.6 x 1.6 x 1.5 cm and appears  normal.  Left ovary measures 4.0 x 1.8 x 2.5 cm and appears normal.  No free pelvic fluid observed.  IMPRESSION:  1. No intrauterine pregnancy or definite double decidual reaction  is identified on today's exam. Endometrial stripe thickness is 1.1  cm. Differential diagnostic considerations include early normal  pregnancy, occult ectopic pregnancy, or miscarriage. Careful  correlation with quantitative beta HCG trend is recommended along  with a low threshold for reimaging.  Original Report Authenticated By: Dellia Cloud, M.D.         MAU Course  Procedures  MDM--patient was diagnosed with UTI/Pregnancy earlier today.  She needs verification of pregnancy to apply for medicaid.  She declines examination-neg for vaginal bleeding or abdominal/pelvic pain   Assessment and Plan  A;  Vomiting in early pregnancy  P:  Rx for Phenergan 12.5mg  1 tab po q6h prn vomiting.      Begin prenatal care with doctor of your choice.      Instructed to return to MAU if you have vaginal bleeding or abdominal pain.  Stevin Bielinski,EVE M 02/29/2012, 6:07 PM

## 2012-02-29 NOTE — MAU Note (Signed)
Patient states she went to [facility] today for vomiting x 1. Had a positive pregnancy test but they did not tell her how far pregnant she is. Patient states she had CHF with her last delivery at [facility] and she need to know how far she is. Daughter has had N/V/D.

## 2012-03-27 ENCOUNTER — Emergency Department (HOSPITAL_BASED_OUTPATIENT_CLINIC_OR_DEPARTMENT_OTHER)
Admission: EM | Admit: 2012-03-27 | Discharge: 2012-03-27 | Disposition: A | Discharge status: Home / Self Care | Payer: Self-pay | Attending: Emergency Medicine | Admitting: Emergency Medicine

## 2012-03-27 ENCOUNTER — Encounter (HOSPITAL_BASED_OUTPATIENT_CLINIC_OR_DEPARTMENT_OTHER): Payer: Self-pay | Admitting: *Deleted

## 2012-03-27 DIAGNOSIS — R220 Localized swelling, mass and lump, head: Secondary | ICD-10-CM | POA: Insufficient documentation

## 2012-03-27 DIAGNOSIS — R221 Localized swelling, mass and lump, neck: Secondary | ICD-10-CM | POA: Insufficient documentation

## 2012-03-27 DIAGNOSIS — K140 Glossitis: Secondary | ICD-10-CM | POA: Insufficient documentation

## 2012-03-27 DIAGNOSIS — O2390 Unspecified genitourinary tract infection in pregnancy, unspecified trimester: Secondary | ICD-10-CM | POA: Insufficient documentation

## 2012-03-27 DIAGNOSIS — M542 Cervicalgia: Secondary | ICD-10-CM | POA: Insufficient documentation

## 2012-03-27 DIAGNOSIS — R599 Enlarged lymph nodes, unspecified: Secondary | ICD-10-CM | POA: Insufficient documentation

## 2012-03-27 DIAGNOSIS — N898 Other specified noninflammatory disorders of vagina: Secondary | ICD-10-CM | POA: Insufficient documentation

## 2012-03-27 DIAGNOSIS — R10816 Epigastric abdominal tenderness: Secondary | ICD-10-CM | POA: Insufficient documentation

## 2012-03-27 DIAGNOSIS — N390 Urinary tract infection, site not specified: Secondary | ICD-10-CM | POA: Insufficient documentation

## 2012-03-27 DIAGNOSIS — K146 Glossodynia: Secondary | ICD-10-CM | POA: Insufficient documentation

## 2012-03-27 DIAGNOSIS — Z79899 Other long term (current) drug therapy: Secondary | ICD-10-CM | POA: Insufficient documentation

## 2012-03-27 DIAGNOSIS — J45909 Unspecified asthma, uncomplicated: Secondary | ICD-10-CM | POA: Insufficient documentation

## 2012-03-27 DIAGNOSIS — R51 Headache: Secondary | ICD-10-CM | POA: Insufficient documentation

## 2012-03-27 DIAGNOSIS — I509 Heart failure, unspecified: Secondary | ICD-10-CM | POA: Insufficient documentation

## 2012-03-27 LAB — URINE MICROSCOPIC-ADD ON

## 2012-03-27 LAB — URINALYSIS, ROUTINE W REFLEX MICROSCOPIC
Bilirubin Urine: NEGATIVE
Hgb urine dipstick: NEGATIVE
Protein, ur: NEGATIVE mg/dL
Urobilinogen, UA: 2 mg/dL — ABNORMAL HIGH (ref 0.0–1.0)

## 2012-03-27 MED ORDER — LIDOCAINE HCL 2 % IJ SOLN: 20.0000 mL | Freq: Once | INTRAMUSCULAR | Status: DC

## 2012-03-27 MED ORDER — LIDOCAINE HCL (PF) 1 % IJ SOLN: 5.0000 mL | Freq: Once | INTRAMUSCULAR | Status: DC

## 2012-03-27 MED ORDER — CEPHALEXIN 500 MG PO CAPS: 500.0000 mg | ORAL_CAPSULE | Freq: Three times a day (TID) | ORAL | Status: DC

## 2012-03-27 MED ORDER — ACETAMINOPHEN 325 MG PO TABS
650.0000 mg | ORAL_TABLET | Freq: Once | ORAL | Status: AC
  Administered 2012-03-27: 650 mg via ORAL
  Filled 2012-03-27: qty 2

## 2012-03-27 MED ORDER — CEPHALEXIN 500 MG PO CAPS: 500.0000 mg | ORAL_CAPSULE | Freq: Three times a day (TID) | ORAL | Status: AC

## 2012-03-27 MED ORDER — LIDOCAINE HCL 2 % IJ SOLN
INTRAMUSCULAR | Status: AC
  Filled 2012-03-27: qty 1

## 2012-03-27 NOTE — Discharge Instructions (Signed)
Abscess An abscess (boil or furuncle) is an infected area that contains a collection of pus.  SYMPTOMS Signs and symptoms of an abscess include pain, tenderness, redness, or hardness. You may feel a moveable soft area under your skin. An abscess can occur anywhere in the body.  TREATMENT  A surgical cut (incision) may be made over your abscess to drain the pus. Gauze may be packed into the space or a drain may be looped through the abscess cavity (pocket). This provides a drain that will allow the cavity to heal from the inside outwards. The abscess may be painful for a few days, but should feel much better if it was drained.  Your abscess, if seen early, may not have localized and may not have been drained. If not, another appointment may be required if it does not get better on its own or with medications. HOME CARE INSTRUCTIONS   Only take over-the-counter or prescription medicines for pain, discomfort, or fever as directed by your caregiver.   Take your antibiotics as directed if they were prescribed. Finish them even if you start to feel better.   Keep the skin and clothes clean around your abscess.   If the abscess was drained, you will need to use gauze dressing to collect any draining pus. Dressings will typically need to be changed 3 or more times a day.   The infection may spread by skin contact with others. Avoid skin contact as much as possible.   Practice good hygiene. This includes regular hand washing, cover any draining skin lesions, and do not share personal care items.   If you participate in sports, do not share athletic equipment, towels, whirlpools, or personal care items. Shower after every practice or tournament.   If a draining area cannot be adequately covered:   Do not participate in sports.   Children should not participate in day care until the wound has healed or drainage stops.   If your caregiver has given you a follow-up appointment, it is very important  to keep that appointment. Not keeping the appointment could result in a much worse infection, chronic or permanent injury, pain, and disability. If there is any problem keeping the appointment, you must call back to this facility for assistance.  SEEK MEDICAL CARE IF:   You develop increased pain, swelling, redness, drainage, or bleeding in the wound site.   You develop signs of generalized infection including muscle aches, chills, fever, or a general ill feeling.   You have an oral temperature above 102 F (38.9 C).  MAKE SURE YOU:   Understand these instructions.   Will watch your condition.   Will get help right away if you are not doing well or get worse.  Document Released: 09/09/2005 Document Revised: 11/19/2011 Document Reviewed: 07/03/2008 [facility] Patient Information 2012 [place], [place].  Urinary Tract Infection Infections of the urinary tract can start in several places. A bladder infection (cystitis), a kidney infection (pyelonephritis), and a prostate infection (prostatitis) are different types of urinary tract infections (UTIs). They usually get better if treated with medicines (antibiotics) that kill germs. Take all the medicine until it is gone. You or your child may feel better in a few days, but TAKE ALL MEDICINE or the infection may not respond and may become more difficult to treat. HOME CARE INSTRUCTIONS   Drink enough water and fluids to keep the urine clear or pale yellow. Cranberry juice is especially recommended, in addition to large amounts of water.  Avoid caffeine, tea, and carbonated beverages. They tend to irritate the bladder.   Alcohol may irritate the prostate.   Only take over-the-counter or prescription medicines for pain, discomfort, or fever as directed by your caregiver.  To prevent further infections:  Empty the bladder often. Avoid holding urine for long periods of time.   After a bowel movement, women should cleanse from front to back. Use  each tissue only once.   Empty the bladder before and after sexual intercourse.  FINDING OUT THE RESULTS OF YOUR TEST Not all test results are available during your visit. If your or your child's test results are not back during the visit, make an appointment with your caregiver to find out the results. Do not assume everything is normal if you have not heard from your caregiver or the medical facility. It is important for you to follow up on all test results. SEEK MEDICAL CARE IF:   There is back pain.   Your baby is older than 3 months with a rectal temperature of 100.5 F (38.1 C) or higher for more than 1 day.   Your or your child's problems (symptoms) are no better in 3 days. Return sooner if you or your child is getting worse.  SEEK IMMEDIATE MEDICAL CARE IF:   There is severe back pain or lower abdominal pain.   You or your child develops chills.   You have a fever.   Your baby is older than 3 months with a rectal temperature of 102 F (38.9 C) or higher.   Your baby is 74 months old or younger with a rectal temperature of 100.4 F (38 C) or higher.   There is nausea or vomiting.   There is continued burning or discomfort with urination.  MAKE SURE YOU:   Understand these instructions.   Will watch your condition.   Will get help right away if you are not doing well or get worse.  Document Released: 09/09/2005 Document Revised: 11/19/2011 Document Reviewed: 04/14/2007 [facility] Patient Information 2012 [place], [place].  RESOURCE GUIDE  Dental Problems  Patients with Medicaid: [facility] [phone] [address]                                           [phone] [address] Phone:  [phone]                                                   Phone:  [phone]  If unable to pay or uninsured, contact:  [facility] or [facility]. to become qualified for the adult dental clinic.  Chronic Pain  Problems Contact [facility]  [phone] Patients need to be referred by their primary care doctor.  Insufficient Money for Medicine Contact [employer] Way:  call "211" or [facility] [phone].  No Primary Care Doctor Call [facility]  [phone] Other agencies that provide inexpensive medical care    [facility]  [phone]    [facility]  [phone]    [facility]  [phone]  [facility]  [phone]    [facility]  [phone]    [facility]  [phone]  Psychological Services [facility]  [phone] [facility]  [phone] [facility]   [phone] (emergency services [phone])  Abuse/Neglect [facility] Child Abuse Hotline 509-124-6233 [facility] Child Abuse Hotline 661-518-7567 (After Hours)  Emergency Shelter [facility] [phone]  Maternity Homes [facility] 343-554-9077 [facility] [phone]  MRSA Hotline #:   603 787 5371    [facility] Resources  [facility] [place] [facility]  United Way                           [facility] Dept. [address] [place]                     [address]         [address]  Blondell Reveal Phone:  [phone]                                  Phone:  [phone]                   Phone:  [phone]  [facility] Mental Health Phone:  [phone]  [facility] Child Abuse Hotline 7165780154 6162278793 (After Hours)

## 2012-03-27 NOTE — ED Notes (Addendum)
Pt states she removed her tongue ring 2 weeks ago and has noticed a swollen area (where it was originally) x 2 days. Also c/o pain on the outside of the right side of her neck. Pt states when she was here last time, she had a UTI and lost that RX. Requesting another. Pt is 7-[redacted] weeks pregnant. No prenatal care. Found out here.

## 2012-03-27 NOTE — ED Provider Notes (Signed)
History   Scribed for Forbes Cellar, MD, the patient was seen in room MH03/MH03 . This chart was scribed by Lewanda Rife.   CSN: [id number]  Arrival date & time 03/27/12  1527   First MD Initiated Contact with Patient 03/27/12 1540      Chief Complaint  Patient presents with  . Oral Swelling    (Consider location/radiation/quality/duration/timing/severity/associated sxs/prior treatment) HPI Brenda Spencer is a 21 y.o. female G2 P1 A0 who presents to the Emergency Department complaining of mild oral swelling for the past 2 days. Pt states she had her tongue piercing removed 2 weeks ago. Pt describes the pain as mild to moderate 5/10. Pt has associated right-sided neck pain for the past 2 days. Pt also states she has a frontal headache, and clear vaginal discharge. Pt denies fever, dysuria, urgency with urination, hematuria, and abdominal pain. Pt states she lost her Rx for her UTI. Pt states she has been tested for STDs recently for vaginal discharge, and the results were negative (per pt discharge is the same). Pt states she is 7-[redacted] weeks pregnant at this time. Pt states she has not taken any medications at home to treat headache.     Past Medical History  Diagnosis Date  . CHF (congestive heart failure)   . Congestive heart failure 2011    related to preeclampsia  . Asthma   . Headache   . Pregnancy induced hypertension   . Eczema     Past Surgical History  Procedure Date  . Tonsillectomy   . Wisdom tooth extraction   . Tear duct probing   . Cholecystectomy     Family History  Problem Relation Age of Onset  . Anesthesia problems Neg Hx     History  Substance Use Topics  . Smoking status: Never Smoker   . Smokeless tobacco: Never Used  . Alcohol Use: No    OB History    Grav Para Term Preterm Abortions TAB SAB Ect Mult Living   2 1  1      1       Review of Systems  Constitutional: Negative for fever.  HENT: Positive for neck pain. Negative for neck  stiffness.   Genitourinary: Positive for vaginal discharge. Negative for dysuria, urgency, frequency, hematuria and difficulty urinating.  All other systems reviewed and are negative.    Allergies  Review of patient's allergies indicates no known allergies.  Home Medications   Current Outpatient Rx  Name Route Sig Dispense Refill  . PRENATAL COMPLETE 14-0.4 MG PO TABS Oral Take 1 tablet by mouth daily. 60 each 0  . ALBUTEROL SULFATE HFA 108 (90 BASE) MCG/ACT IN AERS Inhalation Inhale 2 puffs into the lungs every 6 (six) hours as needed.      . CEPHALEXIN 500 MG PO CAPS Oral Take 1 capsule (500 mg total) by mouth 3 (three) times daily. 21 capsule 0    BP 118/72  Pulse 102  Temp(Src) 98.4 F (36.9 C) (Oral)  Resp 18  Ht 5\' 1"  (1.549 m)  Wt 180 lb (81.647 kg)  BMI 34.01 kg/m2  SpO2 98%  LMP 02/02/2012  Physical Exam  Nursing note and vitals reviewed. Constitutional: She is oriented to person, place, and time. She appears well-developed and well-nourished.  HENT:  Head: Normocephalic and atraumatic.  Mouth/Throat: Oropharynx is clear and moist. No oropharyngeal exudate.       Flesh colored papule tender to palpation on midline of tongue  Eyes: Conjunctivae and EOM are  normal. Pupils are equal, round, and reactive to light.  Neck: Normal range of motion.       Right sided submandibular lymphadenopathy   Cardiovascular: Normal rate and regular rhythm.   Abdominal: Soft. She exhibits no distension. There is tenderness (mild epigatric tenderness on palpation ). There is no rebound and no guarding.  Musculoskeletal: Normal range of motion.  Neurological: She is alert and oriented to person, place, and time.  Skin: Skin is warm and dry.  Psychiatric: She has a normal mood and affect.    ED Course  INCISION AND DRAINAGE Date/Time: 03/27/2012 4:47 PM Performed by: Forbes Cellar Authorized by: Forbes Cellar Consent: Verbal consent obtained. Consent given by:  patient Patient understanding: patient states understanding of the procedure being performed Patient consent: the patient's understanding of the procedure matches consent given Procedure consent: procedure consent matches procedure scheduled Patient identity confirmed: arm band Time out: Immediately prior to procedure a "time out" was called to verify the correct patient, procedure, equipment, support staff and site/side marked as required. Type: abscess Body area: head/neck Anesthesia: local infiltration Local anesthetic: lidocaine 2% without epinephrine Anesthetic total: 0.5 ml Patient sedated: no Scalpel size: 11 Incision type: single straight Complexity: simple Drainage: purulent Drainage amount: scant Wound treatment: wound left open Patient tolerance: Patient tolerated the procedure well with no immediate complications.   (including critical care time)  Labs Reviewed  URINALYSIS, ROUTINE W REFLEX MICROSCOPIC - Abnormal; Notable for the following:    APPearance CLOUDY (*)    Ketones, ur 15 (*)    Urobilinogen, UA 2.0 (*)    Leukocytes, UA SMALL (*)    All other components within normal limits  URINE MICROSCOPIC-ADD ON - Abnormal; Notable for the following:    Squamous Epithelial / LPF MANY (*)    Bacteria, UA MANY (*)    All other components within normal limits  URINE CULTURE   No results found.   1. Abscess, tongue   2. UTI (lower urinary tract infection)     MDM  21yoF pregnant pw very small tongue abscess/pustule at site of tongue piercing. Denies f/c. S/P I&D and probably does not require abx. She has a contaminated urine sample but will treat with keflex given pregnancy. Precautions for return.  I personally performed the services described in this documentation, which was scribed in my presence. The recorded information has been reviewed and considered.     Forbes Cellar, MD 03/27/12 [phone]

## 2012-03-28 ENCOUNTER — Inpatient Hospital Stay (HOSPITAL_COMMUNITY)
Admission: AD | Admit: 2012-03-28 | Discharge: 2012-03-28 | Disposition: A | Discharge status: Home / Self Care | Payer: Medicaid Other | Source: Ambulatory Visit | Attending: Obstetrics & Gynecology | Admitting: Obstetrics & Gynecology

## 2012-03-28 ENCOUNTER — Ambulatory Visit (HOSPITAL_COMMUNITY): Admission: RE | Admit: 2012-03-28 | Payer: Medicaid Other | Source: Ambulatory Visit

## 2012-03-28 ENCOUNTER — Inpatient Hospital Stay (HOSPITAL_COMMUNITY): Payer: Medicaid Other

## 2012-03-28 ENCOUNTER — Encounter (HOSPITAL_COMMUNITY): Payer: Self-pay | Admitting: *Deleted

## 2012-03-28 DIAGNOSIS — B9689 Other specified bacterial agents as the cause of diseases classified elsewhere: Secondary | ICD-10-CM | POA: Insufficient documentation

## 2012-03-28 DIAGNOSIS — O2390 Unspecified genitourinary tract infection in pregnancy, unspecified trimester: Secondary | ICD-10-CM | POA: Insufficient documentation

## 2012-03-28 DIAGNOSIS — A499 Bacterial infection, unspecified: Secondary | ICD-10-CM | POA: Insufficient documentation

## 2012-03-28 DIAGNOSIS — O209 Hemorrhage in early pregnancy, unspecified: Secondary | ICD-10-CM

## 2012-03-28 DIAGNOSIS — N760 Acute vaginitis: Secondary | ICD-10-CM | POA: Insufficient documentation

## 2012-03-28 DIAGNOSIS — O26859 Spotting complicating pregnancy, unspecified trimester: Secondary | ICD-10-CM | POA: Insufficient documentation

## 2012-03-28 LAB — WET PREP, GENITAL: Yeast Wet Prep HPF POC: NONE SEEN

## 2012-03-28 LAB — CBC
HCT: 36.1 % (ref 36.0–46.0)
Platelets: 285 10*3/uL (ref 150–400)
RDW: 14 % (ref 11.5–15.5)
WBC: 11.1 10*3/uL — ABNORMAL HIGH (ref 4.0–10.5)

## 2012-03-28 MED ORDER — METRONIDAZOLE 500 MG PO TABS: 500.0000 mg | ORAL_TABLET | Freq: Two times a day (BID) | ORAL | Status: AC

## 2012-03-28 NOTE — MAU Note (Signed)
Patient states she was seen at [facility] yesterday for an infected tongue ring. Was diagnosed with a UTI and given an Rx but has not gotten filled yet. Started having bleeding at 1600. Not wearing a pad at this time.  Dark pants have a slight wet stain, unable to determine if blood. Pad given to assess.

## 2012-03-28 NOTE — MAU Provider Note (Signed)
History     CSN: [id number]  Arrival date and time: 03/28/12 1821   First Provider Initiated Contact with Patient 03/28/12 2021      Chief Complaint  Patient presents with  . Vaginal Bleeding  . Abdominal Pain   HPI 21 y.o. G2P0101 at [redacted]w[redacted]d with spotting and low abd pain today. Seen 1 month ago in MAU with N/V, quant 200 at that time with no IUP or adnexal mass on u/s.    Past Medical History  Diagnosis Date  . CHF (congestive heart failure)   . Congestive heart failure 2011    related to preeclampsia  . Asthma   . Headache   . Pregnancy induced hypertension   . Eczema     Past Surgical History  Procedure Date  . Tonsillectomy   . Wisdom tooth extraction   . Tear duct probing   . Cholecystectomy     Family History  Problem Relation Age of Onset  . Anesthesia problems Neg Hx     History  Substance Use Topics  . Smoking status: Never Smoker   . Smokeless tobacco: Never Used  . Alcohol Use: No    Allergies: No Known Allergies  Prescriptions prior to admission  Medication Sig Dispense Refill  . albuterol (PROVENTIL HFA;VENTOLIN HFA) 108 (90 BASE) MCG/ACT inhaler Inhale 2 puffs into the lungs every 6 (six) hours as needed.        . cephALEXin (KEFLEX) 500 MG capsule Take 1 capsule (500 mg total) by mouth 3 (three) times daily.  21 capsule  0  . Prenatal Vit-Fe Fumarate-FA (PRENATAL COMPLETE) 14-0.4 MG TABS Take 1 tablet by mouth daily.  60 each  0    Review of Systems  Constitutional: Negative.   Respiratory: Negative.   Cardiovascular: Negative.   Gastrointestinal: Positive for abdominal pain. Negative for nausea, vomiting, diarrhea and constipation.  Genitourinary: Negative for dysuria, urgency, frequency, hematuria and flank pain.       Positive for vaginal bleeding  Musculoskeletal: Negative.   Neurological: Negative.   Psychiatric/Behavioral: Negative.    Physical Exam   Blood pressure 117/81, pulse 105, temperature 97.2 F (36.2 C), temperature  source Oral, resp. rate 20, height 5' (1.524 m), weight 178 lb 3.2 oz (80.831 kg), last menstrual period 02/02/2012, SpO2 100.00%.  Physical Exam  Constitutional: She is oriented to person, place, and time. She appears well-developed and well-nourished. No distress.  HENT:  Head: Normocephalic and atraumatic.  Cardiovascular: Normal rate, regular rhythm and normal heart sounds.   Respiratory: Effort normal and breath sounds normal. No respiratory distress.  GI: Soft. Bowel sounds are normal. She exhibits no distension and no mass. There is no tenderness. There is no rebound and no guarding.  Genitourinary: There is no rash or lesion on the right labia. There is no rash or lesion on the left labia. Uterus is tender. Uterus is not deviated, not enlarged and not fixed. Cervix exhibits no motion tenderness, no discharge and no friability. Right adnexum displays tenderness. Right adnexum displays no mass and no fullness. Left adnexum displays tenderness. Left adnexum displays no mass and no fullness. No erythema, tenderness or bleeding around the vagina. Vaginal discharge (brown) found.  Neurological: She is alert and oriented to person, place, and time.  Skin: Skin is warm and dry.  Psychiatric: She has a normal mood and affect.    MAU Course  Procedures  Results for orders placed during the hospital encounter of 03/28/12 (from the past 24 hour(s))  WET  PREP, GENITAL     Status: Abnormal   Collection Time   03/28/12  8:35 PM      Component Value Range   Yeast Wet Prep HPF POC NONE SEEN  NONE SEEN    Trich, Wet Prep NONE SEEN  NONE SEEN    Clue Cells Wet Prep HPF POC FEW (*) NONE SEEN    WBC, Wet Prep HPF POC FEW (*) NONE SEEN   CBC     Status: Abnormal   Collection Time   03/28/12  8:50 PM      Component Value Range   WBC 11.1 (*) 4.0 - 10.5 (K/uL)   RBC 4.25  3.87 - 5.11 (MIL/uL)   Hemoglobin 12.0  12.0 - 15.0 (g/dL)   HCT 16.1  09.6 - 04.5 (%)   MCV 84.9  78.0 - 100.0 (fL)   MCH 28.2   26.0 - 34.0 (pg)   MCHC 33.2  30.0 - 36.0 (g/dL)   RDW 40.9  81.1 - 91.4 (%)   Platelets 285  150 - 400 (K/uL)   US Ob Comp Less 14 Wks  02/29/2012  *RADIOLOGY REPORT*  Clinical Data: Urine pregnancy test was positive.  Vomiting. Gestational age by last menstrual period 3 weeks, 6 days.  OBSTETRIC <14 WK [place] AND TRANSVAGINAL OB US  Technique: Both transabdominal and transvaginal ultrasound examinations were performed for complete evaluation of the gestation as well as the maternal uterus, adnexal regions, and pelvic cul-de-sac.  Findings: Uterus is retroverted.  Endometrial stripe measures up to 1.1 cm, without definite double decidual reaction noted, and without a visualized intrauterine gestational sac.  The right ovary measures 3.6 x 1.6 x 1.5 cm and appears normal.  Left ovary measures 4.0 x 1.8 x 2.5 cm and appears normal.  No free pelvic fluid observed.  IMPRESSION:  1.  No intrauterine pregnancy or definite double decidual reaction is identified on today's exam.  Endometrial stripe thickness is 1.1 cm.  Differential diagnostic considerations include early normal pregnancy, occult ectopic pregnancy, or miscarriage.  Careful correlation with quantitative beta HCG trend is recommended along with a low threshold for reimaging.  Original Report Authenticated By: Dellia Cloud, M.D.   US Ob Transvaginal  03/28/2012  OBSTETRICAL ULTRASOUND: This exam was performed within a [facility]. The OB US report was generated in the [facility] system, and faxed to the ordering physician.   This report is also available in [employer] and in the [employer]. See [facility] Obstetric US report.   US Ob Transvaginal  02/29/2012  *RADIOLOGY REPORT*  Clinical Data: Urine pregnancy test was positive.  Vomiting. Gestational age by last menstrual period 3 weeks, 6 days.  OBSTETRIC <14 WK [place] AND TRANSVAGINAL OB US  Technique: Both transabdominal and transvaginal ultrasound examinations  were performed for complete evaluation of the gestation as well as the maternal uterus, adnexal regions, and pelvic cul-de-sac.  Findings: Uterus is retroverted.  Endometrial stripe measures up to 1.1 cm, without definite double decidual reaction noted, and without a visualized intrauterine gestational sac.  The right ovary measures 3.6 x 1.6 x 1.5 cm and appears normal.  Left ovary measures 4.0 x 1.8 x 2.5 cm and appears normal.  No free pelvic fluid observed.  IMPRESSION:  1.  No intrauterine pregnancy or definite double decidual reaction is identified on today's exam.  Endometrial stripe thickness is 1.1 cm.  Differential diagnostic considerations include early normal pregnancy, occult ectopic pregnancy, or miscarriage.  Careful correlation with  quantitative beta HCG trend is recommended along with a low threshold for reimaging.  Original Report Authenticated By: Dellia Cloud, M.D.    Assessment and Plan  21 y.o. G2P0101 at [redacted]w[redacted]d Spotting - small subchorionic hemorrhage on u/s, precuations rev'd BV - rx flagyl F/U for prenatal care as soon as possible  Brenda Spencer 03/28/2012, 9:46 PM

## 2012-03-28 NOTE — MAU Note (Signed)
Pt in c/o vaginal spotting since this morning, with heavier bleeding from 1600-1630, has progressively gotten lighter since then.  Reports lower abdominal cramping since noon.  Has a known bladder infection, hasn't started prescription.

## 2012-03-29 LAB — URINE CULTURE
Colony Count: NO GROWTH
Culture: NO GROWTH

## 2012-04-18 ENCOUNTER — Inpatient Hospital Stay (HOSPITAL_COMMUNITY)
Admission: AD | Admit: 2012-04-18 | Discharge: 2012-04-18 | Disposition: A | Discharge status: Home / Self Care | Payer: Medicaid Other | Source: Ambulatory Visit | Attending: Obstetrics & Gynecology | Admitting: Obstetrics & Gynecology

## 2012-04-18 ENCOUNTER — Inpatient Hospital Stay (HOSPITAL_COMMUNITY): Payer: Medicaid Other

## 2012-04-18 ENCOUNTER — Encounter (HOSPITAL_COMMUNITY): Payer: Self-pay | Admitting: *Deleted

## 2012-04-18 DIAGNOSIS — O039 Complete or unspecified spontaneous abortion without complication: Secondary | ICD-10-CM

## 2012-04-18 LAB — CBC
MCV: 85.4 fL (ref 78.0–100.0)
Platelets: 287 10*3/uL (ref 150–400)
RBC: 3.91 MIL/uL (ref 3.87–5.11)
RDW: 14.7 % (ref 11.5–15.5)
WBC: 9 10*3/uL (ref 4.0–10.5)

## 2012-04-18 NOTE — MAU Note (Signed)
Patient states she has been bleeding and having abdominal pain on 5-2. Went to the beach on 5-3 and started getting heavier and she went to the ER at [facility]. Has continued to have bleeding and abdominal pain since that time. States the bleeding is not as bad as before.

## 2012-04-18 NOTE — Discharge Instructions (Signed)
Follow up with your doctor on Wed. As scheduled. Return here as needed.  Miscarriage An early pregnancy loss or spontaneous abortion (miscarriage) is a common problem. This usually happens when the pregnancy is not developing normally. It is very unlikely that you or your partner did anything to cause this, although cigarette smoking, a sexually transmitted disease, excessive alcohol use, or drug abuse can increase the risk. Other causes are:  Abnormalities of the uterus.   Hormone or medical problems.   Trauma or genetic (chromosome) problems.  Having a miscarriage does not change your chances of having a normal pregnancy in the future. Your caregiver will advise you when it is safe to try to get pregnant again. AFTER A MISCARRIAGE  A miscarriage is inevitable when there is continual, heavy vaginal bleeding; cramping; dilation of the cervix; or passing of any pregnancy tissue. Bleeding and cramping will usually continue until all the tissue has been removed from the womb (uterus).   Often the uterus does not clean itself out completely. A medication or a D&C procedure is needed to loosen or remove the pregnancy tissue from the uterus. A D&C scrapes or suctions the tissue out.   If you are RH negative, you may need to have Rh immune globulin to avoid Rh problems.   You may be given medication to fight an infection if the miscarriage was due to an infection.  HOME CARE INSTRUCTIONS   You should rest in bed for the next 2 to 3 days.   Do not take tub baths or put anything in your vagina, including tampons or a douche.   Do not have sex until your caregiver approves.   Avoid exercise or heavy activities until directed by your caregiver.   Save any vaginal discharge that looks like tissue. Ask your caregiver if he or she wants to inspect the discharge.   If you and your partner are having problems with guilt or grieving, talk to your caregiver or get counseling to help you understand and  cope with your pregnancy loss.   Allow enough time to grieve before trying to get pregnant again.  SEEK IMMEDIATE MEDICAL CARE IF:   You have persistent heavy bleeding or a bad smelling vaginal discharge.   You have continued abdominal or pelvic pain.   You have an oral temperature above 102 F (38.9 C), not controlled by medicine.   You have severe weakness, fainting, or keep throwing up (vomiting).   You develop chills.   You are experiencing domestic violence.  MAKE SURE YOU:   Understand these instructions.   Will watch your condition.   Will get help right away if you are not doing well or get worse.  Document Released: 01/07/2005 Document Revised: 11/19/2011 Document Reviewed: 11/22/2008 [facility] Patient Information 2012 [place], [place].

## 2012-04-18 NOTE — MAU Provider Note (Signed)
History     CSN: [id number]  Arrival date & time 04/18/12  1703   None     Chief Complaint  Patient presents with  . Abdominal Pain  . Vaginal Bleeding   HPI Brenda Spencer is a 21 y.o.  Female @ [redacted]w[redacted]d gestation who presents to MAU for vaginal bleeding in pregnancy. She was evaluated here @ [redacted] weeks gestation and on ultrasound a large [facility] was noted. She was at the beach this week and on Sat. Night began bleeding heavy and went to an ER in [facility]. They did a Bhcg but no ultrasound. Told patient ? SAB. Patient here today for follow up. The history was provided by the patient. Past Medical History  Diagnosis Date  . CHF (congestive heart failure)   . Congestive heart failure 2011    related to preeclampsia  . Asthma   . Headache   . Pregnancy induced hypertension   . Eczema     Past Surgical History  Procedure Date  . Tonsillectomy   . Wisdom tooth extraction   . Tear duct probing   . Cholecystectomy     Family History  Problem Relation Age of Onset  . Anesthesia problems Neg Hx     History  Substance Use Topics  . Smoking status: Never Smoker   . Smokeless tobacco: Never Used  . Alcohol Use: No    OB History    Grav Para Term Preterm Abortions TAB SAB Ect Mult Living   2 1  1      1       Review of Systems  Constitutional: Negative for fever, chills, diaphoresis and fatigue.  HENT: Negative for ear pain, congestion, sore throat, facial swelling, neck pain, neck stiffness, dental problem and sinus pressure.   Eyes: Negative for photophobia, pain and discharge.  Respiratory: Negative for cough, chest tightness and wheezing.   Cardiovascular: Negative for chest pain and palpitations.  Gastrointestinal: Positive for abdominal pain and constipation. Negative for nausea, vomiting, diarrhea and abdominal distention.  Genitourinary: Positive for vaginal bleeding and pelvic pain. Negative for dysuria, frequency, flank pain and difficulty urinating.    Musculoskeletal: Positive for back pain. Negative for myalgias and gait problem.  Skin: Negative for color change and rash.  Neurological: Positive for light-headedness. Negative for dizziness, speech difficulty, weakness, numbness and headaches.  Psychiatric/Behavioral: Negative for confusion and agitation. The patient is not nervous/anxious.     Allergies  Review of patient's allergies indicates no known allergies.  Home Medications  No current outpatient prescriptions on file.  BP 121/86  Pulse 95  Temp(Src) 97.7 F (36.5 C) (Oral)  Resp 16  Ht 5' (1.524 m)  Wt 174 lb 6.4 oz (79.107 kg)  BMI 34.06 kg/m2  SpO2 100%  LMP 02/02/2012  Physical Exam  Nursing note and vitals reviewed. Constitutional: She is oriented to person, place, and time. She appears well-developed and well-nourished.  HENT:  Head: Normocephalic.  Eyes: EOM are normal.  Neck: Neck supple.  Cardiovascular: Normal rate.   Pulmonary/Chest: Effort normal.  Abdominal: Soft. There is no tenderness.  Musculoskeletal: Normal range of motion.  Neurological: She is alert and oriented to person, place, and time. No cranial nerve deficit.  Skin: Skin is warm and dry.  Psychiatric: She has a normal mood and affect. Her behavior is normal. Judgment and thought content normal.   Results for orders placed during the hospital encounter of 04/18/12 (from the past 24 hour(s))  CBC  Status: Abnormal   Collection Time   04/18/12  6:01 PM      Component Value Range   WBC 9.0  4.0 - 10.5 (K/uL)   RBC 3.91  3.87 - 5.11 (MIL/uL)   Hemoglobin 11.0 (*) 12.0 - 15.0 (g/dL)   HCT 65.7 (*) 84.6 - 46.0 (%)   MCV 85.4  78.0 - 100.0 (fL)   MCH 28.1  26.0 - 34.0 (pg)   MCHC 32.9  30.0 - 36.0 (g/dL)   RDW 96.2  95.2 - 84.1 (%)   Platelets 287  150 - 400 (K/uL)    US Ob Transvaginal  04/18/2012  *RADIOLOGY REPORT*  Clinical Data: Abnormal vaginal bleeding.  Spontaneous abortion.  TRANSVAGINAL OB ULTRASOUND  Technique:   Transvaginal ultrasound was performed for evaluation of the gestation as well as the maternal uterus and adnexal regions.  Comparison: 03/28/2012.  Findings: There is no longer a viable intrauterine pregnancy identified.  Heterogeneous material is present in the uterine canal, which may represent hemorrhage.  Retained products of conception are not excluded.  There is no internal vascular flow identified.  The thickness of this heterogeneous material in the canal measures up to 18 mm. Adnexa appear within normal limits.  IMPRESSION: Intrauterine gestational sac no longer identified compatible with spontaneous abortion or missed abortion.  Heterogeneous material in the endometrial canal measuring up to 18 mm in thickness without internal vascular flow.  Retained products of conception not excluded.  Original Report Authenticated By: Andreas Newport, M.D.   Blood type A positive  Assessment:  Complete SAB  Plan:   Follow up with your OB as scheduled 04/20/12   Return here as needed. ED Course: discussed with Dr. Debroah Loop, does not feel patient needs Cytotec at this time.  Procedures   MDM

## 2012-04-28 NOTE — MAU Provider Note (Signed)
Agree with note. 

## 2012-05-26 ENCOUNTER — Encounter (HOSPITAL_COMMUNITY): Payer: Medicaid Other

## 2012-05-30 ENCOUNTER — Encounter (HOSPITAL_COMMUNITY): Payer: Self-pay | Admitting: Emergency Medicine

## 2012-05-30 ENCOUNTER — Emergency Department (HOSPITAL_BASED_OUTPATIENT_CLINIC_OR_DEPARTMENT_OTHER)
Admission: EM | Admit: 2012-05-30 | Discharge: 2012-05-30 | Disposition: A | Discharge status: Home / Self Care | Payer: Medicaid Other | Source: Home / Self Care | Attending: Emergency Medicine | Admitting: Emergency Medicine

## 2012-05-30 ENCOUNTER — Emergency Department (HOSPITAL_COMMUNITY)
Admission: EM | Admit: 2012-05-30 | Discharge: 2012-05-31 | Disposition: A | Discharge status: Home / Self Care | Payer: Medicaid Other | Source: Home / Self Care

## 2012-05-30 ENCOUNTER — Encounter (HOSPITAL_BASED_OUTPATIENT_CLINIC_OR_DEPARTMENT_OTHER): Payer: Self-pay

## 2012-05-30 DIAGNOSIS — I509 Heart failure, unspecified: Secondary | ICD-10-CM | POA: Insufficient documentation

## 2012-05-30 DIAGNOSIS — J45909 Unspecified asthma, uncomplicated: Secondary | ICD-10-CM | POA: Insufficient documentation

## 2012-05-30 DIAGNOSIS — H60399 Other infective otitis externa, unspecified ear: Secondary | ICD-10-CM | POA: Insufficient documentation

## 2012-05-30 DIAGNOSIS — Z79899 Other long term (current) drug therapy: Secondary | ICD-10-CM | POA: Insufficient documentation

## 2012-05-30 DIAGNOSIS — H6090 Unspecified otitis externa, unspecified ear: Secondary | ICD-10-CM

## 2012-05-30 MED ORDER — NEOMYCIN-POLYMYXIN-HC 3.5-10000-1 OT SUSP: 4.0000 [drp] | Freq: Three times a day (TID) | OTIC | Status: DC

## 2012-05-30 MED ORDER — ANTIPYRINE-BENZOCAINE 5.4-1.4 % OT SOLN: 3.0000 [drp] | OTIC | Status: DC | PRN

## 2012-05-30 MED ORDER — AMOXICILLIN 500 MG PO CAPS: 500.0000 mg | ORAL_CAPSULE | Freq: Three times a day (TID) | ORAL | Status: DC

## 2012-05-30 NOTE — ED Notes (Signed)
Right ear pain that started at 0100.

## 2012-05-30 NOTE — ED Notes (Signed)
PT. REPORTS LEFT EAR ACHE ONSET THIS MORNING , NO DRAINAGE OR INJURY.

## 2012-05-30 NOTE — Discharge Instructions (Signed)
Otitis Externa  Swimmer's ear (otitis externa) is an infection in the outer ear canal. It can be caused by a germ or a fungus. It may be caused by:   Swimming in dirty water.   Water that stays in the ear after swimming.  HOME CARE   Put drops in the ear canal as told by your doctor.   Only take medicine as told by your doctor.  GET HELP RIGHT AWAY IF:     You have a temperature by mouth above 102 F (38.9 C), not controlled by medicine.   There is ear pain after 3 days.  MAKE SURE YOU:     Understand these instructions.   Will watch this condition.   Will get help right away if you are not doing well or get worse.  Document Released: 05/18/2008 Document Revised: 11/19/2011 Document Reviewed: 05/18/2008  [employer] Patient Information 2012 [employer].

## 2012-05-30 NOTE — ED Notes (Signed)
Unable to locate patient x 1; called in triage, waiting room, and lobby

## 2012-05-30 NOTE — ED Notes (Signed)
Unable to located pt in triage, waiting room, and lobby x 2

## 2012-05-30 NOTE — ED Provider Notes (Signed)
History   This chart was scribed for Rolan Bucco, MD by Sofie Rower. The patient was seen in room MH04/MH04 and the patient's care was started at 4:15 PM     CSN: [id number]  Arrival date & time 05/30/12  1533   First MD Initiated Contact with Patient 05/30/12 1604      Chief Complaint  Patient presents with  . Otalgia    (Consider location/radiation/quality/duration/timing/severity/associated sxs/prior treatment) HPI  Brenda Spencer is a 21 y.o. female who presents to the Emergency Department complaining of moderate, episodic otalgia onset today with associated symptoms of hearing loss. The pt states her "ear began to hurt this morning." Modifying factors include taking ibuprofen which provides moderate relief. Pt has a hx of ear infection.   Pt denies fever, sore throat, rash, chest congestion, nausea.   LNMP 05/16/12     Past Medical History  Diagnosis Date  . CHF (congestive heart failure)   . Congestive heart failure 2011    related to preeclampsia  . Asthma   . Headache   . Pregnancy induced hypertension   . Eczema     Past Surgical History  Procedure Date  . Tonsillectomy   . Wisdom tooth extraction   . Tear duct probing   . Cholecystectomy     Family History  Problem Relation Age of Onset  . Anesthesia problems Neg Hx     History  Substance Use Topics  . Smoking status: Never Smoker   . Smokeless tobacco: Never Used  . Alcohol Use: No    OB History    Grav Para Term Preterm Abortions TAB SAB Ect Mult Living   2 1  1      1       Review of Systems  Constitutional: Negative for fever, chills, diaphoresis and fatigue.  HENT: Negative for congestion, rhinorrhea and sneezing.   Eyes: Negative.   Respiratory: Negative for cough, chest tightness and shortness of breath.   Cardiovascular: Negative for chest pain and leg swelling.  Gastrointestinal: Negative for nausea, vomiting, abdominal pain, diarrhea and blood in stool.  Genitourinary:  Negative for frequency, hematuria, flank pain and difficulty urinating.  Musculoskeletal: Negative for back pain and arthralgias.  Skin: Negative for rash.  Neurological: Negative for dizziness, speech difficulty, weakness, numbness and headaches.    Allergies  Review of patient's allergies indicates no known allergies.  Home Medications   Current Outpatient Rx  Name Route Sig Dispense Refill  . AMOXICILLIN 500 MG PO CAPS Oral Take 1 capsule (500 mg total) by mouth 3 (three) times daily. 21 capsule 0  . ANTIPYRINE-BENZOCAINE 5.4-1.4 % OT SOLN Left Ear Place 3 drops into the left ear every 2 (two) hours as needed for pain. 10 mL 0  . NEOMYCIN-POLYMYXIN-HC 3.5-10000-1 OT SUSP Otic Place 4 drops in ear(s) 3 (three) times daily. 10 mL 0  . PRENATAL COMPLETE 14-0.4 MG PO TABS Oral Take 1 tablet by mouth daily. 60 each 0    BP 114/76  Pulse 93  Temp 98.1 F (36.7 C) (Oral)  Resp 16  Ht 5' (1.524 m)  Wt 170 lb (77.111 kg)  BMI 33.20 kg/m2  LMP 05/16/2012  Physical Exam  Constitutional: She is oriented to person, place, and time. She appears well-developed and well-nourished.  HENT:  Head: Normocephalic and atraumatic.       Moderate erythema in the left ear canal with exudate, mild swelling noted, no pain over the mastoid. Tenderness to the tragus.  Eyes: Pupils are equal, round, and reactive to light.  Neck: Normal range of motion. Neck supple.  Cardiovascular: Normal rate, regular rhythm and normal heart sounds.   Pulmonary/Chest: Effort normal and breath sounds normal. No respiratory distress. She has no wheezes. She has no rales. She exhibits no tenderness.  Musculoskeletal: Normal range of motion. She exhibits no edema.  Lymphadenopathy:    She has no cervical adenopathy.  Neurological: She is alert and oriented to person, place, and time.  Skin: Skin is warm and dry. No rash noted.  Psychiatric: She has a normal mood and affect.    ED Course  Procedures (including  critical care time)  DIAGNOSTIC STUDIES:   COORDINATION OF CARE:   4:17PM- EDP at bedside discusses treatment plan concerning ear infection.   Labs Reviewed - No data to display No results found.   1. Otitis externa       MDM  Pt with otitis externa, no pain over mastiod, well appearing      I personally performed the services described in this documentation, which was scribed in my presence.  The recorded information has been reviewed and considered.    Rolan Bucco, MD 05/30/12 [phone]

## 2012-05-31 ENCOUNTER — Emergency Department (HOSPITAL_COMMUNITY): Payer: Medicaid Other

## 2012-05-31 ENCOUNTER — Encounter (HOSPITAL_COMMUNITY): Payer: Self-pay | Admitting: Emergency Medicine

## 2012-05-31 ENCOUNTER — Emergency Department (HOSPITAL_COMMUNITY)
Admission: EM | Admit: 2012-05-31 | Discharge: 2012-05-31 | Disposition: A | Discharge status: Home / Self Care | Payer: Medicaid Other | Attending: Emergency Medicine | Admitting: Emergency Medicine

## 2012-05-31 DIAGNOSIS — H60509 Unspecified acute noninfective otitis externa, unspecified ear: Secondary | ICD-10-CM

## 2012-05-31 LAB — CBC
HCT: 39.2 % (ref 36.0–46.0)
Hemoglobin: 12.8 g/dL (ref 12.0–15.0)
MCHC: 32.7 g/dL (ref 30.0–36.0)
RDW: 14.1 % (ref 11.5–15.5)
WBC: 15.6 10*3/uL — ABNORMAL HIGH (ref 4.0–10.5)

## 2012-05-31 LAB — BASIC METABOLIC PANEL
CO2: 25 mEq/L (ref 19–32)
Chloride: 100 mEq/L (ref 96–112)
GFR calc Af Amer: >90 mL/min (ref >90)
Potassium: 3.6 mEq/L (ref 3.5–5.1)

## 2012-05-31 LAB — DIFFERENTIAL
Basophils Absolute: 0 10*3/uL (ref 0.0–0.1)
Basophils Relative: 0 % (ref 0–1)
Lymphocytes Relative: 6 % — ABNORMAL LOW (ref 12–46)
Neutro Abs: 13.3 10*3/uL — ABNORMAL HIGH (ref 1.7–7.7)
Neutrophils Relative %: 86 % — ABNORMAL HIGH (ref 43–77)

## 2012-05-31 MED ORDER — OXYCODONE-ACETAMINOPHEN 5-325 MG PO TABS: 1.0000 | ORAL_TABLET | Freq: Four times a day (QID) | ORAL | Status: AC | PRN

## 2012-05-31 MED ORDER — AMOXICILLIN-POT CLAVULANATE 875-125 MG PO TABS: 1.0000 | ORAL_TABLET | Freq: Two times a day (BID) | ORAL | Status: AC

## 2012-05-31 MED ORDER — SODIUM CHLORIDE 0.9 % IV BOLUS (SEPSIS)
1000.0000 mL | Freq: Once | INTRAVENOUS | Status: AC
  Administered 2012-05-31: 1000 mL via INTRAVENOUS

## 2012-05-31 MED ORDER — HYDROMORPHONE HCL PF 1 MG/ML IJ SOLN
1.0000 mg | Freq: Once | INTRAMUSCULAR | Status: AC
  Administered 2012-05-31: 1 mg via INTRAVENOUS
  Filled 2012-05-31: qty 1

## 2012-05-31 MED ORDER — PIPERACILLIN-TAZOBACTAM 3.375 G IVPB
3.3750 g | Freq: Once | INTRAVENOUS | Status: AC
  Administered 2012-05-31: 3.375 g via INTRAVENOUS
  Filled 2012-05-31: qty 50

## 2012-05-31 MED ORDER — DIPHENHYDRAMINE HCL 50 MG/ML IJ SOLN: 25.0000 mg | Freq: Once | INTRAMUSCULAR | Status: DC

## 2012-05-31 MED ORDER — VANCOMYCIN HCL IN DEXTROSE 1-5 GM/200ML-% IV SOLN
1000.0000 mg | Freq: Once | INTRAVENOUS | Status: AC
  Administered 2012-05-31: 1000 mg via INTRAVENOUS
  Filled 2012-05-31 (×2): qty 200

## 2012-05-31 MED ORDER — OXYCODONE-ACETAMINOPHEN 5-325 MG PO TABS
2.0000 | ORAL_TABLET | Freq: Once | ORAL | Status: DC
  Filled 2012-05-31: qty 2

## 2012-05-31 MED ORDER — DIPHENHYDRAMINE HCL 50 MG/ML IJ SOLN
50.0000 mg | Freq: Once | INTRAMUSCULAR | Status: AC
  Administered 2012-05-31: 50 mg via INTRAVENOUS
  Filled 2012-05-31: qty 1

## 2012-05-31 MED ORDER — ONDANSETRON HCL 4 MG/2ML IJ SOLN
4.0000 mg | Freq: Once | INTRAMUSCULAR | Status: AC
  Administered 2012-05-31: 4 mg via INTRAVENOUS
  Filled 2012-05-31: qty 2

## 2012-05-31 NOTE — Discharge Instructions (Signed)
Otitis Externa  Otitis externa ("swimmer's ear") is a germ (bacterial) or fungal infection of the outer ear canal (from the eardrum to the outside of the ear). Swimming in dirty water may cause swimmer's ear. It also may be caused by moisture in the ear from water remaining after swimming or bathing. Often the first signs of infection may be itching in the ear canal. This may progress to ear canal swelling, redness, and pus drainage, which may be signs of infection.  HOME CARE INSTRUCTIONS    Apply the antibiotic drops to the ear canal as prescribed by your doctor.   This can be a very painful medical condition. A strong pain reliever may be prescribed.   Only take over-the-counter or prescription medicines for pain, discomfort, or fever as directed by your caregiver.   If your caregiver has given you a follow-up appointment, it is very important to keep that appointment. Not keeping the appointment could result in a chronic or permanent injury, pain, hearing loss and disability. If there is any problem keeping the appointment, you must call back to this facility for assistance.  PREVENTION    It is important to keep your ear dry. Use the corner of a towel to wick water out of the ear canal after swimming or bathing.   Avoid scratching in your ear. This can damage the ear canal or remove the protective wax lining the canal and make it easier for germs (bacteria) or a fungus to grow.   You may use ear drops made of rubbing alcohol and vinegar after swimming to prevent future "swimmer's ear" infections. Make up a small bottle of equal parts white vinegar and alcohol. Put 3 or 4 drops into each ear after swimming.   Avoid swimming in lakes, polluted water, or poorly chlorinated pools.  SEEK MEDICAL CARE IF:    An oral temperature above 102 F (38.9 C) develops.   Your ear is still painful after 3 days and shows signs of getting worse (redness, swelling, pain, or pus).  MAKE SURE YOU:    Understand these  instructions.   Will watch your condition.   Will get help right away if you are not doing well or get worse.  Document Released: 11/30/2005 Document Revised: 11/19/2011 Document Reviewed: 07/06/2008  [employer] Patient Information 2012 [employer].

## 2012-05-31 NOTE — ED Notes (Signed)
Pt. Stated, I've had a horrible earache since yesterday

## 2012-05-31 NOTE — ED Provider Notes (Signed)
History     CSN: [id number]  Arrival date & time 05/31/12  0846   First MD Initiated Contact with Patient 05/31/12 1010      Chief Complaint  Patient presents with  . Otalgia    (Consider location/radiation/quality/duration/timing/severity/associated sxs/prior treatment) HPI Comments: Patient presents with a 2 day history of ear pain and swelling and drainage. She was seen at [facility] yesterday and diagnosed with otitis externa and given Corticosporin, auralgan and Amoxil. Her pain and swelling is worse today she clearly or drainage from the ear. She is pain and tenderness behind her left ear. She feels nauseated but has not had any vomiting or fever.  The history is provided by the patient.    Past Medical History  Diagnosis Date  . CHF (congestive heart failure)   . Congestive heart failure 2011    related to preeclampsia  . Asthma   . Headache   . Pregnancy induced hypertension   . Eczema     Past Surgical History  Procedure Date  . Tonsillectomy   . Wisdom tooth extraction   . Tear duct probing   . Cholecystectomy     Family History  Problem Relation Age of Onset  . Anesthesia problems Neg Hx     History  Substance Use Topics  . Smoking status: Never Smoker   . Smokeless tobacco: Never Used  . Alcohol Use: No    OB History    Grav Para Term Preterm Abortions TAB SAB Ect Mult Living   2 1  1      1       Review of Systems  Constitutional: Positive for activity change and appetite change. Negative for fever.  HENT: Positive for hearing loss, ear pain and ear discharge. Negative for neck pain.   Eyes: Negative for visual disturbance.  Respiratory: Negative for chest tightness.   Cardiovascular: Negative for chest pain.  Gastrointestinal: Negative for nausea, vomiting and abdominal pain.  Genitourinary: Negative for dysuria, vaginal bleeding and vaginal discharge.  Musculoskeletal: Negative for back pain.  Skin: Negative for rash.    Neurological: Negative for dizziness, weakness and headaches.    Allergies  Review of patient's allergies indicates no known allergies.  Home Medications   Current Outpatient Rx  Name Route Sig Dispense Refill  . AMOXICILLIN 500 MG PO CAPS Oral Take 500 mg by mouth 3 (three) times daily. For 10 days...Marland Kitchenstarted 05/30/12    . ANTIPYRINE-BENZOCAINE 5.4-1.4 % OT SOLN Left Ear Place into the left ear every 2 (two) hours as needed. For pain    . NEOMYCIN-POLYMYXIN-HC 3.5-10000-1 OT SUSP Left Ear Place 4 drops into the left ear 4 (four) times daily.    Marland Kitchen PRENATAL COMPLETE 14-0.4 MG PO TABS Oral Take 1 tablet by mouth daily.      BP 130/89  Pulse 130  Temp 98.6 F (37 C) (Oral)  Resp 22  SpO2 100%  LMP 05/16/2012  Physical Exam  Constitutional: She is oriented to person, place, and time. She appears well-developed and well-nourished. She appears distressed.  HENT:  Head: Normocephalic and atraumatic.  Right Ear: External ear normal.  Mouth/Throat: Oropharynx is clear and moist. No oropharyngeal exudate.       Tenderness and swelling to left pinna with tragus pain. Erythema and tenderness over left mastoid.   Eyes: Conjunctivae and EOM are normal. Pupils are equal, round, and reactive to light.  Neck: Normal range of motion.  Cardiovascular: Normal rate, regular rhythm and normal heart  sounds.   Pulmonary/Chest: Effort normal and breath sounds normal. No respiratory distress.  Abdominal: Soft. There is no tenderness. There is no rebound and no guarding.  Musculoskeletal: Normal range of motion. She exhibits no edema and no tenderness.  Neurological: She is alert and oriented to person, place, and time. No cranial nerve deficit.  Skin: Skin is warm.    ED Course  Procedures (including critical care time)   Labs Reviewed  CBC  DIFFERENTIAL  BASIC METABOLIC PANEL   No results found.   No diagnosis found.    MDM  Clinical concern for mastoiditis. Patient presented  draining left ear, tachycardia, tender mastoid.  Labs, IV fluids, IV antibiotics, CT head.  Case discussed with Dr. Jenne Pane of ENT. He agrees with IV antibiotics and wishes to be called back when imaging results are available. He anticipates outpatient treatment. Patient moved to CDU. Discussed with PAC Anne Shutter.        Glynn Octave, MD 05/31/12 [phone]

## 2012-05-31 NOTE — ED Provider Notes (Signed)
Assumed care of patient in the CDU from Dr. Manus Gunning.  Patient came in today with a chief complaint of ear pain and was found on exam to have some redness and pain over the mastoid.  CT scan has been ordered to rule out Mastoiditis.  Patient awaiting CT.  Dr. Manus Gunning has ordered IV Vancomycin and IV Zosyn.     12:44 PM Dr. Manus Gunning has discussed patient's CT scan with Dr. Jenne Pane with ENT.  He reports that he will be able to see patient in the office this afternoon, but patient will need to call and make an appointment.  Dr. Manus Gunning recommended switching her antibiotic to Augmentin.  Discussed plan with patient.  She is in agreement with the plan.  Will discharge patient home.  Pascal Lux [place], PA-C 05/31/12 2241

## 2012-05-31 NOTE — ED Notes (Signed)
PA notified that new pt was in unit and that she was in pain and itching.

## 2012-05-31 NOTE — ED Notes (Signed)
In to give pt ordered percocet, she is sleeping. She awakens and talks but does not open her eyes fully. She states she hurts. Pt then back to sleep. Called pa to bedside to to reeval giving percocet and verbalize concern for sending pt home oversedated. Pa has cancelled the percocet and told family this. Pt female friend at bedside wants pt to have meds even after explaining the concern for oversedation. Pt mother verbalizes understanding

## 2012-06-02 ENCOUNTER — Encounter (HOSPITAL_BASED_OUTPATIENT_CLINIC_OR_DEPARTMENT_OTHER): Payer: Self-pay | Admitting: Family Medicine

## 2012-06-02 ENCOUNTER — Emergency Department (HOSPITAL_BASED_OUTPATIENT_CLINIC_OR_DEPARTMENT_OTHER)
Admission: EM | Admit: 2012-06-02 | Discharge: 2012-06-02 | Disposition: A | Discharge status: Home / Self Care | Payer: Medicaid Other | Attending: Emergency Medicine | Admitting: Emergency Medicine

## 2012-06-02 DIAGNOSIS — R51 Headache: Secondary | ICD-10-CM | POA: Insufficient documentation

## 2012-06-02 DIAGNOSIS — J45909 Unspecified asthma, uncomplicated: Secondary | ICD-10-CM | POA: Insufficient documentation

## 2012-06-02 DIAGNOSIS — H6090 Unspecified otitis externa, unspecified ear: Secondary | ICD-10-CM

## 2012-06-02 DIAGNOSIS — H60399 Other infective otitis externa, unspecified ear: Secondary | ICD-10-CM | POA: Insufficient documentation

## 2012-06-02 MED ORDER — OXYCODONE-ACETAMINOPHEN 5-325 MG PO TABS: 1.0000 | ORAL_TABLET | Freq: Four times a day (QID) | ORAL | Status: AC | PRN

## 2012-06-02 NOTE — ED Notes (Signed)
Patient states she did not know she needed to continue her ear drops after leaving the hospital and picking up her ear drops at the drug store.  Patient insists that she only needs more pain medications to help her sleep.  Reviewed the discharge instructions for her antibiotic ear drops , ie : 4 drops in left ear 4 times per day.   Patient's speech is slurred and has poor eye contact thru out interview.

## 2012-06-02 NOTE — Discharge Instructions (Signed)
Cortisporin drops:  4 drops in the left ear four times daily for the next week.    Continue augmentin as prescribed.  Otitis Externa Otitis externa ("swimmer's ear") is a germ (bacterial) or fungal infection of the outer ear canal (from the eardrum to the outside of the ear). Swimming in dirty water may cause swimmer's ear. It also may be caused by moisture in the ear from water remaining after swimming or bathing. Often the first signs of infection may be itching in the ear canal. This may progress to ear canal swelling, redness, and pus drainage, which may be signs of infection. HOME CARE INSTRUCTIONS   Apply the antibiotic drops to the ear canal as prescribed by your doctor.   This can be a very painful medical condition. A strong pain reliever may be prescribed.   Only take over-the-counter or prescription medicines for pain, discomfort, or fever as directed by your caregiver.   If your caregiver has given you a follow-up appointment, it is very important to keep that appointment. Not keeping the appointment could result in a chronic or permanent injury, pain, hearing loss and disability. If there is any problem keeping the appointment, you must call back to this facility for assistance.  PREVENTION   It is important to keep your ear dry. Use the corner of a towel to wick water out of the ear canal after swimming or bathing.   Avoid scratching in your ear. This can damage the ear canal or remove the protective wax lining the canal and make it easier for germs (bacteria) or a fungus to grow.   You may use ear drops made of rubbing alcohol and vinegar after swimming to prevent future "swimmer's ear" infections. Make up a small bottle of equal parts white vinegar and alcohol. Put 3 or 4 drops into each ear after swimming.   Avoid swimming in lakes, polluted water, or poorly chlorinated pools.  SEEK MEDICAL CARE IF:   An oral temperature above 102 F (38.9 C) develops.   Your ear is  still painful after 3 days and shows signs of getting worse (redness, swelling, pain, or pus).  MAKE SURE YOU:   Understand these instructions.   Will watch your condition.   Will get help right away if you are not doing well or get worse.  Document Released: 11/30/2005 Document Revised: 11/19/2011 Document Reviewed: 07/06/2008 [facility] Patient Information 2012 [place], [place].

## 2012-06-02 NOTE — ED Notes (Addendum)
Pt sts she was seen for left ear pain here on Monday and at [facility] on Tuesday and is back today for same. Pt sts pain medication is not helping. Pt sts "I think I need a stronger dose". Pt sts she was referred to ENT but cannot afford to go. Pt is not using ear drops prescribed.

## 2012-06-02 NOTE — ED Provider Notes (Signed)
History     CSN: [id number]  Arrival date & time 06/02/12  1001   First MD Initiated Contact with Patient 06/02/12 1035      Chief Complaint  Patient presents with  . Otalgia    (Consider location/radiation/quality/duration/timing/severity/associated sxs/prior treatment) HPI Comments: Was seen here for same two days ago and given augmentin and cortisporin.  She has been taking the augmentin, but for some reason not the cortisporin.  She is not improving.   Patient is a 21 y.o. female presenting with ear pain. The history is provided by the patient.  Otalgia This is a new problem. Episode onset: 4 days ago. There is pain in the left ear. The problem has not changed since onset.There has been no fever. The pain is severe. Associated symptoms include ear discharge and headaches. Her past medical history is significant for hearing loss.    Past Medical History  Diagnosis Date  . CHF (congestive heart failure)   . Congestive heart failure 2011    related to preeclampsia  . Asthma   . Headache   . Pregnancy induced hypertension   . Eczema     Past Surgical History  Procedure Date  . Tonsillectomy   . Wisdom tooth extraction   . Tear duct probing   . Cholecystectomy     Family History  Problem Relation Age of Onset  . Anesthesia problems Neg Hx     History  Substance Use Topics  . Smoking status: Never Smoker   . Smokeless tobacco: Never Used  . Alcohol Use: No    OB History    Grav Para Term Preterm Abortions TAB SAB Ect Mult Living   2 1  1      1       Review of Systems  HENT: Positive for ear pain and ear discharge.   Neurological: Positive for headaches.  All other systems reviewed and are negative.    Allergies  Review of patient's allergies indicates no known allergies.  Home Medications   Current Outpatient Rx  Name Route Sig Dispense Refill  . AMOXICILLIN 500 MG PO CAPS Oral Take 500 mg by mouth 3 (three) times daily. For 10 days...Marland Kitchenstarted  05/30/12    . AMOXICILLIN-POT CLAVULANATE 875-125 MG PO TABS Oral Take 1 tablet by mouth 2 (two) times daily. 20 tablet 0  . ANTIPYRINE-BENZOCAINE 5.4-1.4 % OT SOLN Left Ear Place into the left ear every 2 (two) hours as needed. For pain    . NEOMYCIN-POLYMYXIN-HC 3.5-10000-1 OT SUSP Left Ear Place 4 drops into the left ear 4 (four) times daily.    . OXYCODONE-ACETAMINOPHEN 5-325 MG PO TABS Oral Take 1-2 tablets by mouth every 6 (six) hours as needed for pain. 20 tablet 0  . PRENATAL COMPLETE 14-0.4 MG PO TABS Oral Take 1 tablet by mouth daily.      BP 119/84  Pulse 97  Temp 97.9 F (36.6 C) (Oral)  Resp 16  SpO2 100%  LMP 05/19/2012  Physical Exam  Nursing note and vitals reviewed. Constitutional: She is oriented to person, place, and time. She appears well-developed and well-nourished. No distress.  HENT:  Head: Normocephalic and atraumatic.  Right Ear: External ear normal.       The left ear canal is noted to have purulent discharge and is painful with speculum insertion and manipulation of the pinna.  Neck: Normal range of motion. Neck supple.  Musculoskeletal: Normal range of motion.  Lymphadenopathy:    She has no cervical adenopathy.  Neurological: She is alert and oriented to person, place, and time.  Skin: Skin is warm and dry. She is not diaphoretic.    ED Course  Procedures (including critical care time)  Labs Reviewed - No data to display Ct Head Wo Contrast  05/31/2012  *RADIOLOGY REPORT*  Clinical Data: Hearing loss.  Otalgia.  CT HEAD WITHOUT CONTRAST  Technique:  Contiguous axial images were obtained from the base of the skull through the vertex without contrast.  Comparison: 09/30/2011  Findings: No mass effect, midline shift, or acute intracranial hemorrhage.  Mastoid air cells and visualized paranasal sinuses are clear.  Cranium is intact.  There is soft tissues stranding and thickening involving the left year and external auditory canal.  IMPRESSION: Soft tissue  abnormality involving the left year.  Inflammatory process is not excluded.  No acute intracranial pathology.  Original Report Authenticated By: Donavan Burnet, M.D.     No diagnosis found.    MDM  I have instructed her to start using the cortisporin.  Will give another 12 percocet.        Geoffery Lyons, MD 06/02/12 1051

## 2012-06-02 NOTE — ED Provider Notes (Signed)
Medical screening examination/treatment/procedure(s) were conducted as a shared visit with non-physician practitioner(s) and myself.  I personally evaluated the patient during the encounter    Glynn Octave, MD 06/02/12 1339

## 2012-08-31 ENCOUNTER — Encounter (HOSPITAL_COMMUNITY): Payer: Self-pay | Admitting: *Deleted

## 2012-08-31 ENCOUNTER — Inpatient Hospital Stay (HOSPITAL_COMMUNITY)
Admission: AD | Admit: 2012-08-31 | Discharge: 2012-08-31 | Disposition: A | Discharge status: Home / Self Care | Payer: Medicaid Other | Source: Ambulatory Visit | Attending: Obstetrics & Gynecology | Admitting: Obstetrics & Gynecology

## 2012-08-31 DIAGNOSIS — Z3201 Encounter for pregnancy test, result positive: Secondary | ICD-10-CM | POA: Insufficient documentation

## 2012-08-31 LAB — POCT PREGNANCY, URINE: Preg Test, Ur: POSITIVE — AB

## 2012-08-31 NOTE — MAU Note (Signed)
Pt states she just wants to find out if she is pregnant-denies pain or discomfort

## 2012-10-20 LAB — OB RESULTS CONSOLE GC/CHLAMYDIA: Gonorrhea: NEGATIVE

## 2012-11-07 ENCOUNTER — Emergency Department (HOSPITAL_BASED_OUTPATIENT_CLINIC_OR_DEPARTMENT_OTHER)
Admission: EM | Admit: 2012-11-07 | Discharge: 2012-11-07 | Disposition: A | Discharge status: Home / Self Care | Payer: Medicaid Other | Attending: Emergency Medicine | Admitting: Emergency Medicine

## 2012-11-07 ENCOUNTER — Encounter (HOSPITAL_BASED_OUTPATIENT_CLINIC_OR_DEPARTMENT_OTHER): Payer: Self-pay | Admitting: Family Medicine

## 2012-11-07 ENCOUNTER — Emergency Department (HOSPITAL_BASED_OUTPATIENT_CLINIC_OR_DEPARTMENT_OTHER): Payer: Medicaid Other

## 2012-11-07 DIAGNOSIS — O139 Gestational [pregnancy-induced] hypertension without significant proteinuria, unspecified trimester: Secondary | ICD-10-CM | POA: Insufficient documentation

## 2012-11-07 DIAGNOSIS — O2390 Unspecified genitourinary tract infection in pregnancy, unspecified trimester: Secondary | ICD-10-CM | POA: Insufficient documentation

## 2012-11-07 DIAGNOSIS — R109 Unspecified abdominal pain: Secondary | ICD-10-CM | POA: Insufficient documentation

## 2012-11-07 DIAGNOSIS — R112 Nausea with vomiting, unspecified: Secondary | ICD-10-CM | POA: Insufficient documentation

## 2012-11-07 DIAGNOSIS — Z79899 Other long term (current) drug therapy: Secondary | ICD-10-CM | POA: Insufficient documentation

## 2012-11-07 DIAGNOSIS — J45909 Unspecified asthma, uncomplicated: Secondary | ICD-10-CM | POA: Insufficient documentation

## 2012-11-07 DIAGNOSIS — Z8619 Personal history of other infectious and parasitic diseases: Secondary | ICD-10-CM | POA: Insufficient documentation

## 2012-11-07 DIAGNOSIS — N390 Urinary tract infection, site not specified: Secondary | ICD-10-CM

## 2012-11-07 DIAGNOSIS — I509 Heart failure, unspecified: Secondary | ICD-10-CM | POA: Insufficient documentation

## 2012-11-07 LAB — URINALYSIS, ROUTINE W REFLEX MICROSCOPIC
Glucose, UA: NEGATIVE mg/dL
pH: 6 (ref 5.0–8.0)

## 2012-11-07 LAB — URINE MICROSCOPIC-ADD ON

## 2012-11-07 LAB — CBC WITH DIFFERENTIAL/PLATELET
Basophils Absolute: 0 10*3/uL (ref 0.0–0.1)
Basophils Relative: 0 % (ref 0–1)
Eosinophils Relative: 1 % (ref 0–5)
HCT: 35.4 % — ABNORMAL LOW (ref 36.0–46.0)
MCHC: 34.2 g/dL (ref 30.0–36.0)
MCV: 83.9 fL (ref 78.0–100.0)
Monocytes Absolute: 0.5 10*3/uL (ref 0.1–1.0)
Neutro Abs: 5.6 10*3/uL (ref 1.7–7.7)
RDW: 14.9 % (ref 11.5–15.5)

## 2012-11-07 LAB — COMPREHENSIVE METABOLIC PANEL
AST: 14 U/L (ref 0–37)
Albumin: 2.9 g/dL — ABNORMAL LOW (ref 3.5–5.2)
Calcium: 8.4 mg/dL (ref 8.4–10.5)
Creatinine, Ser: 0.5 mg/dL (ref 0.50–1.10)

## 2012-11-07 MED ORDER — NITROFURANTOIN MONOHYD MACRO 100 MG PO CAPS: 100.0000 mg | ORAL_CAPSULE | Freq: Two times a day (BID) | ORAL | Status: DC

## 2012-11-07 MED ORDER — HYDROCODONE-ACETAMINOPHEN 5-500 MG PO TABS: 1.0000 | ORAL_TABLET | Freq: Four times a day (QID) | ORAL | Status: DC | PRN

## 2012-11-07 MED ORDER — MORPHINE SULFATE 4 MG/ML IJ SOLN
4.0000 mg | Freq: Once | INTRAMUSCULAR | Status: AC
  Administered 2012-11-07: 4 mg via INTRAVENOUS
  Filled 2012-11-07: qty 1

## 2012-11-07 MED ORDER — ONDANSETRON HCL 4 MG/2ML IJ SOLN
4.0000 mg | Freq: Once | INTRAMUSCULAR | Status: AC
  Administered 2012-11-07: 4 mg via INTRAVENOUS
  Filled 2012-11-07: qty 2

## 2012-11-07 NOTE — ED Notes (Addendum)
Pt c/o nausea and 4 episodes of vomiting since Saturday. Pt also sts she has diffuse abdominal pain radiating to back. Pt is [redacted] wks pregnant. Pt sts this is 3rd pregnancy and had miscarriage with 2nd pregnancy.

## 2012-11-07 NOTE — ED Provider Notes (Signed)
History     CSN: [id number]  Arrival date & time 11/07/12  7829   First MD Initiated Contact with Patient 11/07/12 [phone]      Chief Complaint  Patient presents with  . Nausea    (Consider location/radiation/quality/duration/timing/severity/associated sxs/prior treatment) HPI Comments: Patient presents with right-sided abdominal pain that started yesterday. She is [redacted] weeks pregnant and is currently receiving prenatal care in [facility]. She denies any problems with the pregnancy. She did have an office ultrasound about a week ago which she says was fine. She describes an intermittent sharp pains in the right side of her abdomen radiating around to her mid back area. Denies any fevers or chills. She denies any urinary symptoms. She has had some nausea associated with the pain. She's describes the pain is sharp and intermittent. She states that she had similar pain with her prior pregnancy that resolved on it's own. She is status post cholecystectomy. She denies any vaginal bleeding or discharge.   Past Medical History  Diagnosis Date  . CHF (congestive heart failure)   . Congestive heart failure 2011    related to preeclampsia  . Asthma   . Headache   . Pregnancy induced hypertension   . Eczema     Past Surgical History  Procedure Date  . Tonsillectomy   . Wisdom tooth extraction   . Tear duct probing   . Cholecystectomy     Family History  Problem Relation Age of Onset  . Anesthesia problems Neg Hx     History  Substance Use Topics  . Smoking status: Never Smoker   . Smokeless tobacco: Never Used  . Alcohol Use: No    OB History    Grav Para Term Preterm Abortions TAB SAB Ect Mult Living   3 1  1      1       Review of Systems  Constitutional: Negative for fever, chills, diaphoresis and fatigue.  HENT: Negative for congestion, rhinorrhea and sneezing.   Eyes: Negative.   Respiratory: Negative for cough, chest tightness and shortness of breath.     Cardiovascular: Negative for chest pain and leg swelling.  Gastrointestinal: Positive for nausea, vomiting and abdominal pain. Negative for diarrhea and blood in stool.  Genitourinary: Negative for frequency, hematuria, flank pain and difficulty urinating.  Musculoskeletal: Negative for back pain and arthralgias.  Skin: Negative for rash.  Neurological: Negative for dizziness, speech difficulty, weakness, numbness and headaches.    Allergies  Review of patient's allergies indicates no known allergies.  Home Medications   Current Outpatient Rx  Name  Route  Sig  Dispense  Refill  . AMOXICILLIN 500 MG PO CAPS   Oral   Take 500 mg by mouth 3 (three) times daily. For 10 days...Marland Kitchenstarted 05/30/12         . ANTIPYRINE-BENZOCAINE 5.4-1.4 % OT SOLN   Left Ear   Place into the left ear every 2 (two) hours as needed. For pain         . HYDROCODONE-ACETAMINOPHEN 5-500 MG PO TABS   Oral   Take 1-2 tablets by mouth every 6 (six) hours as needed for pain.   6 tablet   0   . NEOMYCIN-POLYMYXIN-HC 3.5-10000-1 OT SUSP   Left Ear   Place 4 drops into the left ear 4 (four) times daily.         Marland Kitchen NITROFURANTOIN MONOHYD MACRO 100 MG PO CAPS   Oral   Take 1 capsule (100 mg total) by  mouth 2 (two) times daily.   14 capsule   0   . PRENATAL COMPLETE 14-0.4 MG PO TABS   Oral   Take 1 tablet by mouth daily.           BP 130/82  Pulse 103  Temp 98 F (36.7 C) (Oral)  Resp 16  SpO2 100%  LMP 07/31/2012  Physical Exam  Constitutional: She is oriented to person, place, and time. She appears well-developed and well-nourished.  HENT:  Head: Normocephalic and atraumatic.  Eyes: Pupils are equal, round, and reactive to light.  Neck: Normal range of motion. Neck supple.  Cardiovascular: Normal rate, regular rhythm and normal heart sounds.   Pulmonary/Chest: Effort normal and breath sounds normal. No respiratory distress. She has no wheezes. She has no rales. She exhibits no  tenderness.  Abdominal: Soft. Bowel sounds are normal. There is tenderness (Moderate tenderness to the right mid and upper abdomen. Positive right CVA tenderness). There is no rebound and no guarding.  Musculoskeletal: Normal range of motion. She exhibits no edema.  Lymphadenopathy:    She has no cervical adenopathy.  Neurological: She is alert and oriented to person, place, and time.  Skin: Skin is warm and dry. No rash noted.  Psychiatric: She has a normal mood and affect.    ED Course  Procedures (including critical care time)  Results for orders placed during the hospital encounter of 11/07/12  CBC WITH DIFFERENTIAL      Component Value Range   WBC 6.8  4.0 - 10.5 K/uL   RBC 4.22  3.87 - 5.11 MIL/uL   Hemoglobin 12.1  12.0 - 15.0 g/dL   HCT 19.1 (*) 47.8 - 29.5 %   MCV 83.9  78.0 - 100.0 fL   MCH 28.7  26.0 - 34.0 pg   MCHC 34.2  30.0 - 36.0 g/dL   RDW 62.1  30.8 - 65.7 %   Platelets 214  150 - 400 K/uL   Neutrophils Relative 82 (*) 43 - 77 %   Neutro Abs 5.6  1.7 - 7.7 K/uL   Lymphocytes Relative 10 (*) 12 - 46 %   Lymphs Abs 0.7  0.7 - 4.0 K/uL   Monocytes Relative 7  3 - 12 %   Monocytes Absolute 0.5  0.1 - 1.0 K/uL   Eosinophils Relative 1  0 - 5 %   Eosinophils Absolute 0.0  0.0 - 0.7 K/uL   Basophils Relative 0  0 - 1 %   Basophils Absolute 0.0  0.0 - 0.1 K/uL  COMPREHENSIVE METABOLIC PANEL      Component Value Range   Sodium 135  135 - 145 mEq/L   Potassium 3.6  3.5 - 5.1 mEq/L   Chloride 102  96 - 112 mEq/L   CO2 21  19 - 32 mEq/L   Glucose, Bld 82  70 - 99 mg/dL   BUN 5 (*) 6 - 23 mg/dL   Creatinine, Ser 8.46  0.50 - 1.10 mg/dL   Calcium 8.4  8.4 - 96.2 mg/dL   Total Protein 6.7  6.0 - 8.3 g/dL   Albumin 2.9 (*) 3.5 - 5.2 g/dL   AST 14  0 - 37 U/L   ALT 11  0 - 35 U/L   Alkaline Phosphatase 88  39 - 117 U/L   Total Bilirubin 0.4  0.3 - 1.2 mg/dL   GFR calc non Af Amer >90  >90 mL/min   GFR calc Af Amer >90  >90  mL/min  LIPASE, BLOOD      Component  Value Range   Lipase 20  11 - 59 U/L  URINALYSIS, ROUTINE W REFLEX MICROSCOPIC      Component Value Range   Color, Urine Meggan (*) YELLOW   APPearance CLOUDY (*) CLEAR   Specific Gravity, Urine 1.028  1.005 - 1.030   pH 6.0  5.0 - 8.0   Glucose, UA NEGATIVE  NEGATIVE mg/dL   Hgb urine dipstick NEGATIVE  NEGATIVE   Bilirubin Urine SMALL (*) NEGATIVE   Ketones, ur >80 (*) NEGATIVE mg/dL   Protein, ur NEGATIVE  NEGATIVE mg/dL   Urobilinogen, UA 1.0  0.0 - 1.0 mg/dL   Nitrite NEGATIVE  NEGATIVE   Leukocytes, UA LARGE (*) NEGATIVE  URINE MICROSCOPIC-ADD ON      Component Value Range   Squamous Epithelial / LPF MANY (*) RARE   WBC, UA 21-50  <3 WBC/hpf   RBC / HPF 0-2  <3 RBC/hpf   Bacteria, UA MANY (*) RARE   Urine-Other MUCOUS PRESENT     No results found.    1. UTI (lower urinary tract infection)   2. Abdominal  pain, other specified site       MDM  Patient's pain control now. On abdominal exam she had minimal tenderness in her abdomen. She is afebrile with a normal white count. Her urine does look infected and I will go ahead and start her on Macrobid for this. Her urine was sent for culture as well. Her pain is on the right side however she is status post cholecystectomy. Her liver enzymes are normal. She has no specific pain in her right lower quadrant to suggest appendicitis. In without fever or elevated white count I feel like her pain is likely related to the urinary tract infection. I did advise her she needs close followup with her OB/GYN in [facility]. Advised to call make an appointment for recheck within 24 hours. I will start her on Macrobid and gave her a very small prescription of Vicodin. Otherwise return here if her symptoms worsen.        Rolan Bucco, MD 11/07/12 [phone]

## 2012-11-07 NOTE — ED Notes (Signed)
Child crying at bedside. Entered the room to give child a teddy bear.  Child seating in the bedside chair with bedside table across childs lap so child was unable to get down.
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
## 2012-11-08 LAB — URINE CULTURE

## 2012-11-18 ENCOUNTER — Other Ambulatory Visit: Payer: Self-pay | Admitting: Advanced Practice Midwife

## 2012-11-18 ENCOUNTER — Inpatient Hospital Stay (HOSPITAL_COMMUNITY)
Admission: AD | Admit: 2012-11-18 | Discharge: 2012-11-18 | Disposition: A | Discharge status: Home / Self Care | Payer: Medicaid Other | Source: Ambulatory Visit | Attending: Obstetrics & Gynecology | Admitting: Obstetrics & Gynecology

## 2012-11-18 ENCOUNTER — Encounter (HOSPITAL_COMMUNITY): Payer: Self-pay | Admitting: *Deleted

## 2012-11-18 ENCOUNTER — Other Ambulatory Visit: Payer: Self-pay | Admitting: Family

## 2012-11-18 DIAGNOSIS — J4530 Mild persistent asthma, uncomplicated: Secondary | ICD-10-CM | POA: Diagnosis present

## 2012-11-18 DIAGNOSIS — N949 Unspecified condition associated with female genital organs and menstrual cycle: Secondary | ICD-10-CM | POA: Insufficient documentation

## 2012-11-18 DIAGNOSIS — O2340 Unspecified infection of urinary tract in pregnancy, unspecified trimester: Secondary | ICD-10-CM

## 2012-11-18 DIAGNOSIS — J45909 Unspecified asthma, uncomplicated: Secondary | ICD-10-CM

## 2012-11-18 DIAGNOSIS — R05 Cough: Secondary | ICD-10-CM | POA: Insufficient documentation

## 2012-11-18 DIAGNOSIS — O99891 Other specified diseases and conditions complicating pregnancy: Secondary | ICD-10-CM | POA: Insufficient documentation

## 2012-11-18 DIAGNOSIS — A5901 Trichomonal vulvovaginitis: Secondary | ICD-10-CM | POA: Insufficient documentation

## 2012-11-18 DIAGNOSIS — O98819 Other maternal infectious and parasitic diseases complicating pregnancy, unspecified trimester: Secondary | ICD-10-CM | POA: Insufficient documentation

## 2012-11-18 DIAGNOSIS — J069 Acute upper respiratory infection, unspecified: Secondary | ICD-10-CM | POA: Insufficient documentation

## 2012-11-18 DIAGNOSIS — R059 Cough, unspecified: Secondary | ICD-10-CM | POA: Insufficient documentation

## 2012-11-18 DIAGNOSIS — O2390 Unspecified genitourinary tract infection in pregnancy, unspecified trimester: Secondary | ICD-10-CM | POA: Insufficient documentation

## 2012-11-18 DIAGNOSIS — N390 Urinary tract infection, site not specified: Secondary | ICD-10-CM | POA: Diagnosis present

## 2012-11-18 LAB — WET PREP, GENITAL

## 2012-11-18 LAB — URINALYSIS, ROUTINE W REFLEX MICROSCOPIC
Ketones, ur: NEGATIVE mg/dL
Nitrite: NEGATIVE
pH: 6 (ref 5.0–8.0)

## 2012-11-18 LAB — URINE MICROSCOPIC-ADD ON

## 2012-11-18 MED ORDER — ALBUTEROL SULFATE (5 MG/ML) 0.5% IN NEBU
2.5000 mg | INHALATION_SOLUTION | RESPIRATORY_TRACT | Status: AC
  Administered 2012-11-18: 2.5 mg via RESPIRATORY_TRACT

## 2012-11-18 MED ORDER — ALBUTEROL SULFATE HFA 108 (90 BASE) MCG/ACT IN AERS: 2.0000 | INHALATION_SPRAY | Freq: Four times a day (QID) | RESPIRATORY_TRACT | Status: DC | PRN

## 2012-11-18 MED ORDER — METRONIDAZOLE 500 MG PO TABS
2000.0000 mg | ORAL_TABLET | Freq: Once | ORAL | Status: AC
  Administered 2012-11-18: 2000 mg via ORAL
  Filled 2012-11-18: qty 4

## 2012-11-18 MED ORDER — IPRATROPIUM BROMIDE 0.02 % IN SOLN
RESPIRATORY_TRACT | Status: AC
  Filled 2012-11-18: qty 2.5

## 2012-11-18 MED ORDER — ALBUTEROL SULFATE (5 MG/ML) 0.5% IN NEBU
INHALATION_SOLUTION | RESPIRATORY_TRACT | Status: AC
  Filled 2012-11-18: qty 0.5

## 2012-11-18 MED ORDER — NITROFURANTOIN MONOHYD MACRO 100 MG PO CAPS: 100.0000 mg | ORAL_CAPSULE | Freq: Two times a day (BID) | ORAL | Status: DC

## 2012-11-18 MED ORDER — BUTALBITAL-APAP-CAFFEINE 50-325-40 MG PO TABS
2.0000 | ORAL_TABLET | ORAL | Status: AC
  Administered 2012-11-18: 2 via ORAL
  Filled 2012-11-18: qty 2

## 2012-11-18 MED ORDER — IPRATROPIUM BROMIDE 0.02 % IN SOLN
0.5000 mg | RESPIRATORY_TRACT | Status: AC
  Administered 2012-11-18: 0.5 mg via RESPIRATORY_TRACT

## 2012-11-18 MED ORDER — ONDANSETRON 8 MG PO TBDP
8.0000 mg | ORAL_TABLET | Freq: Once | ORAL | Status: AC
  Administered 2012-11-18: 8 mg via ORAL
  Filled 2012-11-18: qty 1

## 2012-11-18 NOTE — MAU Note (Signed)
Pt states Coughing started 1 week ago and is experiencing vomiting when coughing.  Has Hx of CHF with last pregnancy.  Also C/O watery vaginal discharge all the time but increasing with coughing.  Pt has had a HA x 3 days without relief with Tylenol.  Denies Vaginal Bleeding or ROM.

## 2012-11-18 NOTE — MAU Provider Note (Signed)
History     CSN: [id number]  Arrival date and time: 11/18/12 7829   First Provider Initiated Contact with Patient 11/18/12 [phone]      Chief Complaint  Patient presents with  . Emesis During Pregnancy  . Vaginal Discharge   HPI Brenda Spencer is a 21 y.o. G3P0101 at [redacted]w[redacted]d presenting with cough and vaginal discharge. The cough started 1 week ago and is associated with sore throat and runny nose. She is also vomiting with coughing spells. She uses an albuterol nebulizer for asthma, has been using it once daily for the past 4 days with no improvement of symptoms. She is out of albuterol and is currently using her daughter's. Denies fever, change in appetite, change in BM, vaginal bleeding. She has been leaking a small amount clear fluid for the past week and is unsure if it urine or from the vagina. Recently treated for a UTI, currently denying urinary symptoms. Multiple family members at home are ill with similar URI symptoms.      OB History    Grav Para Term Preterm Abortions TAB SAB Ect Mult Living   3 1  1      1       Past Medical History  Diagnosis Date  . CHF (congestive heart failure)   . Congestive heart failure 2011    related to preeclampsia  . Asthma   . Headache   . Pregnancy induced hypertension   . Eczema     Past Surgical History  Procedure Date  . Tonsillectomy   . Wisdom tooth extraction   . Tear duct probing   . Cholecystectomy     Family History  Problem Relation Age of Onset  . Anesthesia problems Neg Hx   . Other Neg Hx     History  Substance Use Topics  . Smoking status: Never Smoker   . Smokeless tobacco: Never Used  . Alcohol Use: No    Allergies: No Known Allergies  Prescriptions prior to admission  Medication Sig Dispense Refill  . acetaminophen (TYLENOL) 500 MG tablet Take 1,000 mg by mouth every 6 (six) hours as needed. For pain \      . nitrofurantoin, macrocrystal-monohydrate, (MACROBID) 100 MG capsule Take 1 capsule (100 mg  total) by mouth 2 (two) times daily.  14 capsule  0  . Prenatal Vit-Fe Fumarate-FA (PRENATAL COMPLETE) 14-0.4 MG TABS Take 1 tablet by mouth daily.        Review of Systems  Constitutional: Negative for fever, chills and malaise/fatigue.  Gastrointestinal: Positive for nausea, vomiting and abdominal pain. Negative for diarrhea and constipation.  Genitourinary: Negative for dysuria, urgency, frequency, hematuria and flank pain.  Neurological: Positive for headaches.   Physical Exam   Blood pressure 117/69, pulse 108, temperature 98.2 F (36.8 C), temperature source Oral, resp. rate 16, height 5' 0.25" (1.53 m), weight 78.019 kg (172 lb), last menstrual period 07/31/2012, SpO2 99.00%, unknown if currently breastfeeding.  Physical Exam  Constitutional: She appears well-developed and well-nourished.  Cardiovascular: Normal rate, regular rhythm and normal heart sounds.   Respiratory: Effort normal. She has wheezes (expiratory).  GI: Soft. Bowel sounds are normal. There is tenderness in the right lower quadrant and left lower quadrant. There is no rebound and no guarding.  Genitourinary: Cervix exhibits no motion tenderness. No bleeding around the vagina. Vaginal discharge (clear, thick discharge) found.  Skin: Skin is warm and dry. Rash (eczemaous patches, bilateral antecubital fossas) noted. She is not diaphoretic.  Psychiatric: She has  a normal mood and affect. Her behavior is normal. Judgment and thought content normal.    MAU Course  Procedures  Results for orders placed during the hospital encounter of 11/18/12 (from the past 24 hour(s))  URINALYSIS, ROUTINE W REFLEX MICROSCOPIC     Status: Abnormal   Collection Time   11/18/12  9:54 AM      Component Value Range   Color, Urine YELLOW  YELLOW   APPearance CLEAR  CLEAR   Specific Gravity, Urine 1.025  1.005 - 1.030   pH 6.0  5.0 - 8.0   Glucose, UA NEGATIVE  NEGATIVE mg/dL   Hgb urine dipstick TRACE (*) NEGATIVE   Bilirubin Urine  NEGATIVE  NEGATIVE   Ketones, ur NEGATIVE  NEGATIVE mg/dL   Protein, ur NEGATIVE  NEGATIVE mg/dL   Urobilinogen, UA 1.0  0.0 - 1.0 mg/dL   Nitrite NEGATIVE  NEGATIVE   Leukocytes, UA LARGE (*) NEGATIVE  URINE MICROSCOPIC-ADD ON     Status: Abnormal   Collection Time   11/18/12  9:54 AM      Component Value Range   Squamous Epithelial / LPF MANY (*) RARE   WBC, UA 21-50  <3 WBC/hpf   RBC / HPF 0-2  <3 RBC/hpf   Bacteria, UA MANY (*) RARE   Urine-Other TRICHOMONAS PRESENT    AMNISURE RUPTURE OF MEMBRANE (ROM)     Status: Normal   Collection Time   11/18/12 10:20 AM      Component Value Range   Amnisure ROM NEGATIVE    WET PREP, GENITAL     Status: Abnormal   Collection Time   11/18/12 10:20 AM      Component Value Range   Yeast Wet Prep HPF POC NONE SEEN  NONE SEEN   Trich, Wet Prep FEW (*) NONE SEEN   Clue Cells Wet Prep HPF POC NONE SEEN  NONE SEEN   WBC, Wet Prep HPF POC FEW (*) NONE SEEN   *GC/Chlamydia pending*  FHR 162 by doppler  Assessment and Plan  21 y.o. G3P0101 at [redacted]w[redacted]d with vaginal discharge, abdominal pain, and cough   1. URI, history of asthma -Wheezing improved with breathing treatment in office (albuterol and Atrovent) -Prescription for albuterol inhaler 2 puffs q6h prn (x1 with no refills, pt has appt Dec 17) -Fioricet today for HA relief -F/U with prenatal provider or PCP about asthma management  2. Trichomonas  -Metronidazole 2000 mg today -Zofran 8 mg today for nausea prevention with flagyl -Instructed pt that partner needs to be treated, wait 2 weeks after partner is treated to have intercourse  3. UTI -Nitrofurantoin 100mg   -Culture pending  Corky Downs 11/18/2012, 10:28 AM   I have seen this patient and agree with the above PA student's note.  LEFTWICH-KIRBY, LISA Certified Nurse-Midwife

## 2012-11-19 LAB — URINE CULTURE

## 2012-11-20 LAB — GC/CHLAMYDIA PROBE AMP: GC Probe RNA: NEGATIVE

## 2012-11-21 ENCOUNTER — Telehealth: Payer: Self-pay | Admitting: *Deleted

## 2012-11-21 ENCOUNTER — Other Ambulatory Visit: Payer: Self-pay | Admitting: Obstetrics & Gynecology

## 2012-11-21 ENCOUNTER — Encounter: Payer: Self-pay | Admitting: *Deleted

## 2012-11-21 DIAGNOSIS — A599 Trichomoniasis, unspecified: Secondary | ICD-10-CM

## 2012-11-21 MED ORDER — METRONIDAZOLE 500 MG PO TABS: 500.0000 mg | ORAL_TABLET | Freq: Two times a day (BID) | ORAL | Status: DC

## 2012-11-21 MED ORDER — METRONIDAZOLE 500 MG PO TABS: ORAL_TABLET | ORAL | Status: DC

## 2012-11-21 NOTE — Telephone Encounter (Signed)
Pt seen in MAU by Dr Jearld Lesch and positive for Trich in urine.  Per Dr Penne Lash she is to be treated with Flagyl 500mg  BID x7 days.  This was sent to [facility].  LM on pt's voicemail.  Partner also needs treatment.

## 2012-11-21 NOTE — MAU Provider Note (Signed)
Attestation of Attending Supervision of Advanced Practitioner (CNM/NP): Evaluation and management procedures were performed by the Advanced Practitioner under my supervision and collaboration.  I have reviewed the Advanced Practitioner's note and chart, and I agree with the management and plan.  Hezzie Karim, MD, FACOG Attending Obstetrician & Gynecologist Faculty Practice, [facility]  

## 2012-11-21 NOTE — Addendum Note (Signed)
Addended by: Jill Side on: 11/21/2012 02:06 PM   Modules accepted: Orders

## 2012-11-26 ENCOUNTER — Inpatient Hospital Stay (HOSPITAL_COMMUNITY)
Admission: AD | Admit: 2012-11-26 | Discharge: 2012-11-26 | Disposition: A | Discharge status: Home / Self Care | Payer: Medicaid Other | Source: Ambulatory Visit | Attending: Obstetrics and Gynecology | Admitting: Obstetrics and Gynecology

## 2012-11-26 ENCOUNTER — Encounter (HOSPITAL_COMMUNITY): Payer: Self-pay

## 2012-11-26 DIAGNOSIS — N949 Unspecified condition associated with female genital organs and menstrual cycle: Secondary | ICD-10-CM

## 2012-11-26 DIAGNOSIS — R109 Unspecified abdominal pain: Secondary | ICD-10-CM | POA: Insufficient documentation

## 2012-11-26 DIAGNOSIS — O99891 Other specified diseases and conditions complicating pregnancy: Secondary | ICD-10-CM | POA: Insufficient documentation

## 2012-11-26 LAB — URINALYSIS, ROUTINE W REFLEX MICROSCOPIC
Glucose, UA: NEGATIVE mg/dL
Hgb urine dipstick: NEGATIVE
Leukocytes, UA: NEGATIVE
Protein, ur: NEGATIVE mg/dL
Specific Gravity, Urine: 1.025 (ref 1.005–1.030)
Urobilinogen, UA: 1 mg/dL (ref 0.0–1.0)

## 2012-11-26 MED ORDER — OXYCODONE-ACETAMINOPHEN 5-325 MG PO TABS: 1.0000 | ORAL_TABLET | Freq: Once | ORAL | Status: DC

## 2012-11-26 MED ORDER — CYCLOBENZAPRINE HCL 10 MG PO TABS: 10.0000 mg | ORAL_TABLET | Freq: Two times a day (BID) | ORAL | Status: DC | PRN

## 2012-11-26 MED ORDER — OXYCODONE-ACETAMINOPHEN 5-325 MG PO TABS
1.0000 | ORAL_TABLET | Freq: Once | ORAL | Status: AC
  Administered 2012-11-26: 1 via ORAL
  Filled 2012-11-26: qty 1

## 2012-11-26 MED ORDER — COMFORT FIT MATERNITY SUPP LG MISC: 1.0000 [IU] | Status: DC | PRN

## 2012-11-26 MED ORDER — CYCLOBENZAPRINE HCL 10 MG PO TABS
10.0000 mg | ORAL_TABLET | Freq: Once | ORAL | Status: AC
  Administered 2012-11-26: 10 mg via ORAL
  Filled 2012-11-26: qty 1

## 2012-11-26 NOTE — MAU Note (Signed)
Cramping since Friday am. Some pelvic pressure -may be cause I can't have BM. Last BM THursday.

## 2012-11-26 NOTE — MAU Provider Note (Signed)
History     CSN: [id number]  Arrival date and time: 11/26/12 2058   First Provider Initiated Contact with Patient 11/26/12 2152      Chief Complaint  Patient presents with  . Abdominal Cramping  . Pelvic Pain   HPI 21 y.o. [id number] at [redacted]w[redacted]d with cramping since yesterday, no bleeding or discharge. Right sided low abd pain, worse with movement/walking. Recently had pelvic exam in MAU and treated for Trich. Receives prenatal care in [place], requests referral to high risk clinic here d/t history of preeclampsia requiring delivery at 35 weeks in last pregnancy and congestive heart failure postpartum.    Past Medical History  Diagnosis Date  . CHF (congestive heart failure)   . Congestive heart failure 2011    related to preeclampsia  . Asthma   . Headache   . Pregnancy induced hypertension   . Eczema     Past Surgical History  Procedure Date  . Tonsillectomy   . Wisdom tooth extraction   . Tear duct probing   . Cholecystectomy     Family History  Problem Relation Age of Onset  . Anesthesia problems Neg Hx   . Other Neg Hx     History  Substance Use Topics  . Smoking status: Never Smoker   . Smokeless tobacco: Never Used  . Alcohol Use: No    Allergies: No Known Allergies  Prescriptions prior to admission  Medication Sig Dispense Refill  . acetaminophen (TYLENOL) 500 MG tablet Take 1,000 mg by mouth every 6 (six) hours as needed. For pain \      . albuterol (PROVENTIL HFA;VENTOLIN HFA) 108 (90 BASE) MCG/ACT inhaler Inhale 2 puffs into the lungs every 6 (six) hours as needed for wheezing.  1 Inhaler  0  . nitrofurantoin, macrocrystal-monohydrate, (MACROBID) 100 MG capsule Take 1 capsule (100 mg total) by mouth 2 (two) times daily.  14 capsule  0  . Prenatal Vit-Fe Fumarate-FA (PRENATAL COMPLETE) 14-0.4 MG TABS Take 1 tablet by mouth daily.      . metroNIDAZOLE (FLAGYL) 500 MG tablet Take 1 tablet (500 mg total) by mouth 2 (two) times daily.  14 tablet  0     Review of Systems  Constitutional: Negative.   Respiratory: Negative.   Cardiovascular: Negative.   Gastrointestinal: Positive for abdominal pain and constipation. Negative for nausea, vomiting and diarrhea.  Genitourinary: Negative for dysuria, urgency, frequency, hematuria and flank pain.       Negative for vaginal bleeding, vaginal discharge, dyspareunia  Musculoskeletal: Negative.   Neurological: Negative.   Psychiatric/Behavioral: Negative.    Physical Exam   Blood pressure 126/81, pulse 111, temperature 97.7 F (36.5 C), temperature source Oral, resp. rate 20, height 5\' 1"  (1.549 m), weight 172 lb 9.6 oz (78.291 kg), last menstrual period 07/31/2012, unknown if currently breastfeeding.  Physical Exam  Nursing note and vitals reviewed. Constitutional: She is oriented to person, place, and time. She appears well-developed and well-nourished. No distress.  Cardiovascular: Normal rate.   Respiratory: Effort normal.  GI: Soft. She exhibits no mass. There is tenderness (diffuse, lower right side). There is no rebound and no guarding.  Genitourinary:       Dilation: Closed Effacement (%): Thick Exam by:: Georges Mouse CNM   Musculoskeletal: Normal range of motion.  Neurological: She is alert and oriented to person, place, and time.  Skin: Skin is warm and dry.  Psychiatric: She has a normal mood and affect.    MAU Course  Procedures  Results  for orders placed during the hospital encounter of 11/26/12 (from the past 24 hour(s))  URINALYSIS, ROUTINE W REFLEX MICROSCOPIC     Status: Normal   Collection Time   11/26/12  9:15 PM      Component Value Range   Color, Urine YELLOW  YELLOW   APPearance CLEAR  CLEAR   Specific Gravity, Urine 1.025  1.005 - 1.030   pH 6.5  5.0 - 8.0   Glucose, UA NEGATIVE  NEGATIVE mg/dL   Hgb urine dipstick NEGATIVE  NEGATIVE   Bilirubin Urine NEGATIVE  NEGATIVE   Ketones, ur NEGATIVE  NEGATIVE mg/dL   Protein, ur NEGATIVE  NEGATIVE mg/dL    Urobilinogen, UA 1.0  0.0 - 1.0 mg/dL   Nitrite NEGATIVE  NEGATIVE   Leukocytes, UA NEGATIVE  NEGATIVE     Assessment and Plan   1. Round ligament pain       Medication List     As of 11/26/2012 11:18 PM    START taking these medications         COMFORT FIT MATERNITY SUPP LG Misc   1 Units by Does not apply route as needed (for pregnancy related pain).      cyclobenzaprine 10 MG tablet   Commonly known as: FLEXERIL   Take 1 tablet (10 mg total) by mouth 2 (two) times daily as needed for muscle spasms.      oxyCODONE-acetaminophen 5-325 MG per tablet   Commonly known as: PERCOCET/ROXICET   Take 1 tablet by mouth once.      CONTINUE taking these medications         acetaminophen 500 MG tablet   Commonly known as: TYLENOL      albuterol 108 (90 BASE) MCG/ACT inhaler   Commonly known as: PROVENTIL HFA;VENTOLIN HFA   Inhale 2 puffs into the lungs every 6 (six) hours as needed for wheezing.      nitrofurantoin (macrocrystal-monohydrate) 100 MG capsule   Commonly known as: MACROBID   Take 1 capsule (100 mg total) by mouth 2 (two) times daily.      Prenatal Complete 14-0.4 MG Tabs      STOP taking these medications         metroNIDAZOLE 500 MG tablet   Commonly known as: FLAGYL          Where to get your medications    These are the prescriptions that you need to pick up.   You may get these medications from any pharmacy.         COMFORT FIT MATERNITY SUPP LG Misc   cyclobenzaprine 10 MG tablet   oxyCODONE-acetaminophen 5-325 MG per tablet            Follow-up Information    Follow up with [facility]. (someone will call to schedule)    Contact information:   [address] [place] [address] [phone]           Lorry Furber 11/26/2012, 9:53 PM

## 2012-11-29 NOTE — MAU Provider Note (Signed)
Attestation of Attending Supervision of Advanced Practitioner (CNM/NP): Evaluation and management procedures were performed by the Advanced Practitioner under my supervision and collaboration.  I have reviewed the Advanced Practitioner's note and chart, and I agree with the management and plan.  Yanilen Adamik 11/29/2012 5:37 PM

## 2012-12-05 ENCOUNTER — Ambulatory Visit: Payer: Self-pay

## 2012-12-09 ENCOUNTER — Ambulatory Visit (INDEPENDENT_AMBULATORY_CARE_PROVIDER_SITE_OTHER): Payer: Medicaid Other | Admitting: Obstetrics and Gynecology

## 2012-12-09 ENCOUNTER — Encounter: Payer: Self-pay | Admitting: Obstetrics and Gynecology

## 2012-12-09 DIAGNOSIS — Z331 Pregnant state, incidental: Secondary | ICD-10-CM

## 2012-12-09 NOTE — Progress Notes (Signed)
NOB interview. Pt transferring to our office from [facility] with records due to proximity. States last visit 11/29/12 and had U/S and screening for Down's syndrome 12/05/12. Pt to sign ROI for additional records. Pt has also been seen at [facility] for round ligament pain. Pt needs varicella titer at next lab. Pt had MFM consult prior to this pregnancy, report included.

## 2012-12-09 NOTE — Progress Notes (Unsigned)
Additional note.  Available labs abstracted. Records include orders but not results. Results requested.

## 2012-12-14 NOTE — L&D Delivery Note (Signed)
Delivery Note    At 4:53 AM a viable female was delivered via  NSVD(Presentation: ROA;  ).  Delivered through loose nuchal cord, APGAR: ,8, 9  ; weight: pending  Placenta status: spontaneous, intact , .  Cord: 3VC,  with the following complications: none  Cord pH: not collected   Anesthesia:  Epidural  Episiotomy: none  Lacerations: periurethral  Suture Repair: 4-0 monocryl  Est. Blood Loss (mL):   Mom to postpartum.  Baby to nursery-stable. Infant remains skin-skin w mom Pt plans to bottle feed    Lyn Joens M 04/29/2013, 6:37 AM

## 2012-12-15 ENCOUNTER — Encounter: Payer: Medicaid Other | Admitting: Family

## 2012-12-31 ENCOUNTER — Inpatient Hospital Stay (HOSPITAL_COMMUNITY)
Admission: AD | Admit: 2012-12-31 | Discharge: 2012-12-31 | Disposition: A | Discharge status: Home / Self Care | Payer: Medicaid Other | Source: Ambulatory Visit | Attending: Obstetrics and Gynecology | Admitting: Obstetrics and Gynecology

## 2012-12-31 ENCOUNTER — Encounter (HOSPITAL_COMMUNITY): Payer: Self-pay | Admitting: Obstetrics and Gynecology

## 2012-12-31 ENCOUNTER — Other Ambulatory Visit: Payer: Self-pay | Admitting: Obstetrics and Gynecology

## 2012-12-31 ENCOUNTER — Inpatient Hospital Stay (HOSPITAL_COMMUNITY): Payer: Medicaid Other

## 2012-12-31 DIAGNOSIS — O4690 Antepartum hemorrhage, unspecified, unspecified trimester: Secondary | ICD-10-CM | POA: Insufficient documentation

## 2012-12-31 DIAGNOSIS — O98819 Other maternal infectious and parasitic diseases complicating pregnancy, unspecified trimester: Secondary | ICD-10-CM | POA: Insufficient documentation

## 2012-12-31 DIAGNOSIS — A5901 Trichomonal vulvovaginitis: Secondary | ICD-10-CM

## 2012-12-31 LAB — URINE MICROSCOPIC-ADD ON

## 2012-12-31 LAB — WET PREP, GENITAL
Clue Cells Wet Prep HPF POC: NONE SEEN
Yeast Wet Prep HPF POC: NONE SEEN

## 2012-12-31 LAB — GROUP B STREP BY PCR: Group B strep by PCR: NEGATIVE

## 2012-12-31 LAB — OB RESULTS CONSOLE GBS: GBS: NEGATIVE

## 2012-12-31 LAB — URINALYSIS, ROUTINE W REFLEX MICROSCOPIC
Bilirubin Urine: NEGATIVE
Protein, ur: 30 mg/dL — AB
Urobilinogen, UA: 1 mg/dL (ref 0.0–1.0)

## 2012-12-31 MED ORDER — METRONIDAZOLE 500 MG PO TABS: 500.0000 mg | ORAL_TABLET | Freq: Once | ORAL | Status: DC

## 2012-12-31 MED ORDER — TERCONAZOLE 0.4 % VA CREA: 1.0000 | TOPICAL_CREAM | Freq: Every day | VAGINAL | Status: DC

## 2012-12-31 NOTE — Progress Notes (Signed)
Written and verbal d/c instructions given and understanding voiced. 

## 2012-12-31 NOTE — Progress Notes (Signed)
History  Brenda Spencer is a 22 y.o. [phone] at [redacted]w[redacted]d   Chief Complaint  Patient presents with  . Vaginal Bleeding  . Vaginal Discharge  Pt reports she had started having some vaginal bleeding since yesterday. Reports she has started leaking fluid today and having increased pelvic pressure, with +FM, tx for trich before and partner treated.    [redacted]w[redacted]d   Chief Complaint  Patient presents with  . Vaginal Bleeding  . Vaginal Discharge   @SFHPI @  Prior to Admission medications   Medication Sig Start Date End Date Taking? Authorizing Provider  Prenatal Vit-Fe Fumarate-FA (PRENATAL COMPLETE) 14-0.4 MG TABS Take 1 tablet by mouth daily.   Yes Historical Provider, MD  albuterol (PROVENTIL HFA;VENTOLIN HFA) 108 (90 BASE) MCG/ACT inhaler Inhale 2 puffs into the lungs every 6 (six) hours as needed for wheezing. 11/18/12   Lisa A Leftwich-Kirby, CNM  metroNIDAZOLE (FLAGYL) 500 MG tablet Take 1 tablet (500 mg total) by mouth once. 12/31/12   Lavera Guise, CNM  terconazole (TERAZOL 7) 0.4 % vaginal cream Place 1 applicator vaginally at bedtime. 12/31/12   Lavera Guise, CNM    Patient Active Problem List  Diagnosis  . Asthma  . UTI (urinary tract infection) during pregnancy  . Trichomonas vaginalis (TV) infection  . Acute URI   Vitals:  Blood pressure 118/69, pulse 93, temperature 97.9 F (36.6 C), temperature source Oral, resp. rate 18, height 5' 0.25" (1.53 m), weight 174 lb 6.4 oz (79.107 kg), last menstrual period 07/31/2012. OB History    Grav Para Term Preterm Abortions TAB SAB Ect Mult Living   3 1  1 1  1   1      Obstetric Comments   2011 PIH;IOL; PP CHF-HOSPITALIZED X 1 WEEK      Past Medical History  Diagnosis Date  . Eczema   . CHF (congestive heart failure)     RESOLVED; HAD CARDIAC EVAL   . Congestive heart failure 2011    related to preeclampsia  . Hyperthyroidism AGE 14    NO TX  . Hypothyroidism AGE 14     NO TX  . Pregnancy induced hypertension 2011  .  Preterm labor 2011  . Infection     UTI OCC  . Infection 2013    TRICH  . Headache 2011    MIGRAQINES; OTC  . Asthma 2009; CORNERSTONE    INHALER PRN    Past Surgical History  Procedure Date  . Tonsillectomy   . Wisdom tooth extraction   . Tear duct probing   . Cholecystectomy     Family History  Problem Relation Age of Onset  . Anesthesia problems Neg Hx   . Other Neg Hx   . Asthma Brother   . Learning disabilities Brother   . Asthma Daughter   . Learning disabilities Daughter   . Seizures Daughter   . Learning disabilities Other     History  Substance Use Topics  . Smoking status: Passive Smoke Exposure - Never Smoker  . Smokeless tobacco: Never Used  . Alcohol Use: No    Allergies: No Known Allergies  Prescriptions prior to admission  Medication Sig Dispense Refill  . Prenatal Vit-Fe Fumarate-FA (PRENATAL COMPLETE) 14-0.4 MG TABS Take 1 tablet by mouth daily.      Marland Kitchen albuterol (PROVENTIL HFA;VENTOLIN HFA) 108 (90 BASE) MCG/ACT inhaler Inhale 2 puffs into the lungs every 6 (six) hours as needed for wheezing.  1 Inhaler  0    @ROS @ Physical Exam  Blood pressure 118/69, pulse 93, temperature 97.9 F (36.6 C), temperature source Oral, resp. rate 18, height 5' 0.25" (1.53 m), weight 174 lb 6.4 oz (79.107 kg), last menstrual period 07/31/2012.  @PHYSEXAMBYAGE2 @ Labs:  Recent Results (from the past 24 hour(s))  URINALYSIS, ROUTINE W REFLEX MICROSCOPIC   Collection Time   12/31/12  5:26 PM      Component Value Range   Color, Urine YELLOW  YELLOW   APPearance CLOUDY (*) CLEAR   Specific Gravity, Urine >1.030 (*) 1.005 - 1.030   pH 6.0  5.0 - 8.0   Glucose, UA NEGATIVE  NEGATIVE mg/dL   Hgb urine dipstick SMALL (*) NEGATIVE   Bilirubin Urine NEGATIVE  NEGATIVE   Ketones, ur NEGATIVE  NEGATIVE mg/dL   Protein, ur 30 (*) NEGATIVE mg/dL   Urobilinogen, UA 1.0  0.0 - 1.0 mg/dL   Nitrite NEGATIVE  NEGATIVE   Leukocytes, UA MODERATE (*) NEGATIVE  URINE  MICROSCOPIC-ADD ON   Collection Time   12/31/12  5:26 PM      Component Value Range   Squamous Epithelial / LPF FEW (*) RARE   WBC, UA TOO NUMEROUS TO COUNT  <3 WBC/hpf   RBC / HPF 7-10  <3 RBC/hpf   Bacteria, UA MANY (*) RARE   Urine-Other TRICHOMONAS PRESENT    AMNISURE RUPTURE OF MEMBRANE (ROM)   Collection Time   12/31/12  6:43 PM      Component Value Range   Amnisure ROM NEGATIVE    WET PREP, GENITAL   Collection Time   12/31/12  6:44 PM      Component Value Range   Yeast Wet Prep HPF POC NONE SEEN  NONE SEEN   Trich, Wet Prep FEW (*) NONE SEEN   Clue Cells Wet Prep HPF POC NONE SEEN  NONE SEEN   WBC, Wet Prep HPF POC MANY (*) NONE SEEN   ASSESSMENT: Patient Active Problem List  Diagnosis  . Asthma  . UTI (urinary tract infection) during pregnancy  . Trichomonas vaginalis (TV) infection  . Acute URI   Physical Examination:  {Physical exam: Calm, no distress,  abd soft, gravid, nt, bowel sounds active, abdomen nontender Fundal height 22 Normal hair distrubition mons pubis,  EGBUS WNL, sterile speculum exam,  vagina pink, moist normal rugae, mod amount white discharge Vag LTC No edema to lower extremities FHTS + UC without [place] S equals D, AFI wnl ED Course  Assessment/Plan [redacted]w[redacted]d Trich, no intercourse until test of cure discussed. Lavera Guise, CNM

## 2012-12-31 NOTE — MAU Provider Note (Signed)
History   Patient is 22 weeks, presenting with c/o bleeding today and questionable leaking.  FHR auscultated by triage staff.  No active bleeding at present.  Patient will be seen by Lavera Guise, CNM, due to this CNM involved in acute patient care situation.   Patient is clinically stable to proceed to [place]. Will plan amnisure, UA, and cultures during her MAU evaluation  Nigel Bridgeman, CNM

## 2012-12-31 NOTE — MAU Note (Signed)
Pt reports she had started having some vaginal bleeding since yesterday. Reports she has started leaking fluid today and having increased pelvic pressure.

## 2013-01-01 LAB — GC/CHLAMYDIA PROBE AMP: GC Probe RNA: NEGATIVE

## 2013-01-02 LAB — URINE CULTURE: Colony Count: 10000

## 2013-01-06 ENCOUNTER — Telehealth: Payer: Self-pay | Admitting: Obstetrics and Gynecology

## 2013-01-06 NOTE — Telephone Encounter (Signed)
Pt was called back regarding her call for what to take for cold sx Pt made aware of everything (OTC)  that is listed on in the NOB packet. Robitussin for cough, saline nasal spray, etc  Pt agreeable   LC CMA

## 2013-01-09 ENCOUNTER — Ambulatory Visit: Payer: Medicaid Other | Admitting: Obstetrics and Gynecology

## 2013-01-09 ENCOUNTER — Encounter: Payer: Self-pay | Admitting: Obstetrics and Gynecology

## 2013-01-09 VITALS — BP 110/62 | Wt 174.0 lb

## 2013-01-09 DIAGNOSIS — O09299 Supervision of pregnancy with other poor reproductive or obstetric history, unspecified trimester: Secondary | ICD-10-CM | POA: Insufficient documentation

## 2013-01-09 DIAGNOSIS — Z8679 Personal history of other diseases of the circulatory system: Secondary | ICD-10-CM | POA: Insufficient documentation

## 2013-01-09 DIAGNOSIS — Z124 Encounter for screening for malignant neoplasm of cervix: Secondary | ICD-10-CM

## 2013-01-09 DIAGNOSIS — Z331 Pregnant state, incidental: Secondary | ICD-10-CM

## 2013-01-09 DIAGNOSIS — M549 Dorsalgia, unspecified: Secondary | ICD-10-CM

## 2013-01-09 LAB — POCT WET PREP (WET MOUNT): Whiff Test: NEGATIVE

## 2013-01-09 LAB — POCT URINALYSIS DIPSTICK
Bilirubin, UA: NEGATIVE
Ketones, UA: NEGATIVE
Spec Grav, UA: 1.02
pH, UA: 6

## 2013-01-09 NOTE — Progress Notes (Signed)
[facility] NEW OB EXAMINATION   Brenda Spencer is a 22 y.o. female, [phone], who presents at [redacted]w[redacted]d gestation for a new obstetrical examination. The patient has had previous care in [place], [place]. Motrin are not available at this time.  The patient has a past history of preeclampsia which required induction and delivery at [redacted] weeks gestation.  The patient experienced postpartum congestive failure that was said to be secondary to her preeclampsia.  She has not had problems recently.  She has a past history of asthma.  She was seen at the emergency department on January 18 because of pelvic pressure.  An ultrasound showed a 21 week and 6 day gestation.  Trichomonas was found on a wet prep.  The patient was treated with metronidazole.  The following portions of the patient's history were reviewed and updated as appropriate: allergies, current medications, past family history, past medical history, past social history, past surgical history and problem list.  OB History    Grav Para Term Preterm Abortions TAB SAB Ect Mult Living   3 1  1 1  1   1      Obstetric Comments   2011 PIH;IOL; PP CHF-HOSPITALIZED X 1 WEEK      Past Medical History  Diagnosis Date  . Eczema   . CHF (congestive heart failure)     RESOLVED; HAD CARDIAC EVAL   . Congestive heart failure 2011    related to preeclampsia  . Hyperthyroidism AGE 17    NO TX  . Hypothyroidism AGE 17     NO TX  . Pregnancy induced hypertension 2011  . Preterm labor 2011  . Infection     UTI OCC  . Infection 2013    TRICH  . Headache 2011    MIGRAQINES; OTC  . Asthma 2009; CORNERSTONE    INHALER PRN    Past Surgical History  Procedure Date  . Tonsillectomy   . Wisdom tooth extraction   . Tear duct probing   . Cholecystectomy     Family History  Problem Relation Age of Onset  . Anesthesia problems Neg Hx   . Other Neg Hx   . Asthma Brother   . Learning disabilities Brother   . Asthma Daughter   .  Learning disabilities Daughter   . Seizures Daughter   . Learning disabilities Other     Social History:  reports that she has been passively smoking.  She has never used smokeless tobacco. She reports that she does not drink alcohol or use illicit drugs.  Allergies: No Known Allergies  Medications: see above   Objective:    BP 110/62  Wt 174 lb (78.926 kg)  LMP 07/31/2012    Weight:  Wt Readings from Last 1 Encounters:  01/09/13 174 lb (78.926 kg)          BMI: There is no height on file to calculate BMI.  General Appearance: Alert, appropriate appearance for age. No acute distress HEENT: Grossly normal Neck / Thyroid: Supple, no masses, nodes or enlargement Lungs: clear to auscultation bilaterally Back: No CVA tenderness Breast Exam: No masses or nodes.No dimpling, nipple retraction or discharge. Cardiovascular: Regular rate and rhythm. S1, S2, no murmur Gastrointestinal: Soft, non-tender, no masses or organomegaly.                               Fundal height: 24 weeks  Fetal heart tones audible: yes, 150 bpm  ++++++++++++++++++++++++++++++++++++++++++++++++++++++++  Pelvic Exam: External genitalia: normal general appearance Vaginal: normal without tenderness, induration or masses and relaxation: Yes Cervix: normal appearance Adnexa: normal bimanual exam Uterus: gravid, nontender, 24 weeks size  ++++++++++++++++++++++++++++++++++++++++++++++++++++++++  Lymphatic Exam: Non-palpable nodes in neck, clavicular, axillary, or inguinal regions Neurologic: Normal speech, no tremor  Psychiatric: Alert and oriented, appropriate affect.  Prenatal labs: ABO, Rh:   Antibody:   Rubella:   RPR:    HBsAg:    HIV:    GBS:    Urine culture: Negative Hemoglobin 12.1 Platelet count 214,000 Gonorrhea negative Chlamydia negative  Wet Prep:   Previously done:            yes                     If no: Whiff:                     Negative                               Clue cells:             no                              PH:                        4.5                              Yeast:                    no                              Trichomoniasis:    no  Urine analysis:     Negative except for leukocytes    Assessment:   22 y.o. female G3P0111 at [redacted]w[redacted]d gestation ( EDC is May 07, 2013) by: Normal Last menstrual period: yes Ultrasound:                               yes                                 Resolved trichomoniasis  Prior history of preeclampsia  Prior history of congestive heart failure secondary to preeclampsia  Low back pain   Plan:    We discussed routine pregnancy issues:  Toxoplasmosis was reviewed.  The patient was told to avoid cat liter boxes and feces.  The patient was told to avoid predator fish including tuna because of our concerns for mercury consumption.  The patient was told to avoid soft cheeses.  The patient was told to be sure that all lunch meats are well cooked.  Our model for pregnancy management was reviewed.  Proper diet and exercise reviewed.  Return to office in 4 weeks.  Medications include:  Prenatal vitamins Flexeril 10 mg every 8 hours as needed for low back pain. We'll make arrangements for patient to see a physical therapist.  Maryclare Labrador try once again to the records  from [facility]  [address] [place].D.

## 2013-01-09 NOTE — Progress Notes (Signed)
[redacted]w[redacted]d  C/o: back pain pt states she wants to see a chiropractor. She had to see a chiropractor w/ previous pregnancy.   Pt states she went to the hospital and was tested positive for Trich.  Pt was told to test again x 3 wks. Pt has already done genetic testing.   UA Chem 10 Resulted today.

## 2013-01-11 LAB — PAP IG W/ RFLX HPV ASCU

## 2013-01-12 ENCOUNTER — Telehealth: Payer: Self-pay | Admitting: Obstetrics and Gynecology

## 2013-01-12 DIAGNOSIS — M549 Dorsalgia, unspecified: Secondary | ICD-10-CM

## 2013-01-12 NOTE — Telephone Encounter (Signed)
TC to pt.  INformed will refer to [facility] outpatient PT per DR AVS.  If pt does not receive notification of appt in next few days to call. Pt verbalizes comprehension.

## 2013-01-12 NOTE — Telephone Encounter (Signed)
Message copied by Mason Jim on Thu Jan 12, 2013  8:57 AM ------      Message from: Tim Lair      Created: Mon Jan 09, 2013  4:25 PM      Regarding: pt needs Referral for back pain.       Pt needs appt to see a specialist for her back pain in pregnancy. Per AVS .       LC CMA

## 2013-01-17 ENCOUNTER — Ambulatory Visit: Payer: Medicaid Other | Attending: Obstetrics and Gynecology

## 2013-01-17 DIAGNOSIS — IMO0001 Reserved for inherently not codable concepts without codable children: Secondary | ICD-10-CM | POA: Insufficient documentation

## 2013-01-17 DIAGNOSIS — R5381 Other malaise: Secondary | ICD-10-CM | POA: Insufficient documentation

## 2013-01-17 DIAGNOSIS — M545 Low back pain, unspecified: Secondary | ICD-10-CM | POA: Insufficient documentation

## 2013-01-22 ENCOUNTER — Telehealth: Payer: Self-pay | Admitting: Obstetrics and Gynecology

## 2013-01-22 ENCOUNTER — Inpatient Hospital Stay (HOSPITAL_COMMUNITY): Payer: Medicaid Other

## 2013-01-22 ENCOUNTER — Other Ambulatory Visit: Payer: Self-pay | Admitting: Obstetrics and Gynecology

## 2013-01-22 ENCOUNTER — Encounter (HOSPITAL_COMMUNITY): Payer: Self-pay | Admitting: Family

## 2013-01-22 ENCOUNTER — Inpatient Hospital Stay (HOSPITAL_COMMUNITY)
Admission: AD | Admit: 2013-01-22 | Discharge: 2013-01-22 | Disposition: A | Discharge status: Home / Self Care | Payer: Medicaid Other | Source: Ambulatory Visit | Attending: Obstetrics and Gynecology | Admitting: Obstetrics and Gynecology

## 2013-01-22 DIAGNOSIS — O99891 Other specified diseases and conditions complicating pregnancy: Secondary | ICD-10-CM | POA: Insufficient documentation

## 2013-01-22 DIAGNOSIS — M549 Dorsalgia, unspecified: Secondary | ICD-10-CM | POA: Insufficient documentation

## 2013-01-22 DIAGNOSIS — R109 Unspecified abdominal pain: Secondary | ICD-10-CM | POA: Insufficient documentation

## 2013-01-22 DIAGNOSIS — O2390 Unspecified genitourinary tract infection in pregnancy, unspecified trimester: Secondary | ICD-10-CM | POA: Insufficient documentation

## 2013-01-22 DIAGNOSIS — N390 Urinary tract infection, site not specified: Secondary | ICD-10-CM | POA: Insufficient documentation

## 2013-01-22 LAB — URINALYSIS, ROUTINE W REFLEX MICROSCOPIC
Bilirubin Urine: NEGATIVE
Ketones, ur: 15 mg/dL — AB
Leukocytes, UA: NEGATIVE
Nitrite: NEGATIVE
Specific Gravity, Urine: >1.03 — ABNORMAL HIGH (ref 1.005–1.030)
Urobilinogen, UA: 0.2 mg/dL (ref 0.0–1.0)
pH: 6 (ref 5.0–8.0)

## 2013-01-22 LAB — COMPREHENSIVE METABOLIC PANEL
ALT: 8 U/L (ref 0–35)
BUN: 5 mg/dL — ABNORMAL LOW (ref 6–23)
CO2: 21 mEq/L (ref 19–32)
Calcium: 8 mg/dL — ABNORMAL LOW (ref 8.4–10.5)
GFR calc Af Amer: >90 mL/min (ref >90)
GFR calc non Af Amer: >90 mL/min (ref >90)
Glucose, Bld: 83 mg/dL (ref 70–99)
Total Protein: 5.5 g/dL — ABNORMAL LOW (ref 6.0–8.3)

## 2013-01-22 LAB — CBC WITH DIFFERENTIAL/PLATELET
Basophils Absolute: 0 10*3/uL (ref 0.0–0.1)
Basophils Relative: 0 % (ref 0–1)
HCT: 29.4 % — ABNORMAL LOW (ref 36.0–46.0)
Lymphocytes Relative: 15 % (ref 12–46)
MCHC: 33 g/dL (ref 30.0–36.0)
Monocytes Absolute: 1 10*3/uL (ref 0.1–1.0)
Neutro Abs: 8.6 10*3/uL — ABNORMAL HIGH (ref 1.7–7.7)
Neutrophils Relative %: 76 % (ref 43–77)
Platelets: 222 10*3/uL (ref 150–400)
RDW: 13.8 % (ref 11.5–15.5)
WBC: 11.2 10*3/uL — ABNORMAL HIGH (ref 4.0–10.5)

## 2013-01-22 LAB — URINE MICROSCOPIC-ADD ON

## 2013-01-22 MED ORDER — MENTHOL 3 MG MT LOZG
1.0000 | LOZENGE | OROMUCOSAL | Status: DC | PRN
  Filled 2013-01-22: qty 9

## 2013-01-22 MED ORDER — LACTATED RINGERS IV SOLN
INTRAVENOUS | Status: DC
  Administered 2013-01-22: 19:00:00 via INTRAVENOUS

## 2013-01-22 MED ORDER — LACTATED RINGERS IV SOLN: INTRAVENOUS | Status: DC

## 2013-01-22 MED ORDER — BUTORPHANOL TARTRATE 1 MG/ML IJ SOLN
1.0000 mg | INTRAMUSCULAR | Status: DC | PRN
  Administered 2013-01-22: 1 mg via INTRAVENOUS
  Filled 2013-01-22: qty 1

## 2013-01-22 MED ORDER — CEPHALEXIN 500 MG PO CAPS: 500.0000 mg | ORAL_CAPSULE | Freq: Two times a day (BID) | ORAL | Status: DC

## 2013-01-22 MED ORDER — BENZONATATE 100 MG PO CAPS
200.0000 mg | ORAL_CAPSULE | Freq: Three times a day (TID) | ORAL | Status: DC | PRN
  Administered 2013-01-22: 200 mg via ORAL
  Administered 2013-01-22: 100 mg via ORAL
  Filled 2013-01-22: qty 2

## 2013-01-22 MED ORDER — ONDANSETRON HCL 4 MG/2ML IJ SOLN
4.0000 mg | Freq: Three times a day (TID) | INTRAMUSCULAR | Status: DC | PRN
  Administered 2013-01-22: 4 mg via INTRAVENOUS
  Filled 2013-01-22: qty 2

## 2013-01-22 MED ORDER — LACTATED RINGERS IV BOLUS (SEPSIS)
500.0000 mL | Freq: Once | INTRAVENOUS | Status: AC
  Administered 2013-01-22: 18:00:00 via INTRAVENOUS

## 2013-01-22 MED ORDER — BENZONATATE 200 MG PO CAPS: 200.0000 mg | ORAL_CAPSULE | Freq: Three times a day (TID) | ORAL | Status: DC | PRN

## 2013-01-22 MED ORDER — HYDROCODONE-ACETAMINOPHEN 5-500 MG PO TABS: 1.0000 | ORAL_TABLET | Freq: Four times a day (QID) | ORAL | Status: DC | PRN

## 2013-01-22 NOTE — Telephone Encounter (Signed)
TC from patient--25 weeks, with severe back pain x 3 days. Was sent to PT, but hasn't gone yet. Rx'd Flexeril from Dr. Stefano Gaul. ? Increased d/c.  Come to MAU.

## 2013-01-22 NOTE — MAU Provider Note (Signed)
History   22 yo G3P0111 at 25 weeks presented via EMS with right back and flank pain x 3 days, worsening today.  Denies leaking or bleeding, reports +FM.  Denies dysuria, frequency, or hematuria.  Had [place] at [facility] at 21 weeks for evaluation of vaginal bleeding.  No recent IC.  Denies fever, does have nausea, no vomiting.  Reported pain started on left side, but now has localized to right side and flank in last 24 hours.  Pain in constant, with colicky exacerbations.  Had recent URI with cough, but no current meds.  Had c/o back pain at initial P[facility] with Dr. Stefano Gaul on 01/09/13, and was Rx'd Flexeril.  Was treated for trich 12/6 in MAU, with negative wet prep on 01/09/13.  Negative pelvic cultures on 12/31/12, and negative urine culture on 12/31/12.  Patient Active Problem List  Diagnosis  . Asthma  . UTI (urinary tract infection) during pregnancy  . Trichomonas vaginalis (TV) infection  . Acute URI  . Hx of preeclampsia, prior pregnancy, currently pregnant  . History of congestive heart failure  Was induced at 35 weeks with 1st baby for pre-eclampsia, with CHF postpartum.  Records have been requested from [facility].   Chief Complaint  Patient presents with  . Back Pain  . Flank Pain    OB History   Grav Para Term Preterm Abortions TAB SAB Ect Mult Living   3 1  1 1  1   1      Obstetric Comments   2011 PIH;IOL; PP CHF-HOSPITALIZED X 1 WEEK      Past Medical History  Diagnosis Date  . Eczema   . CHF (congestive heart failure)     RESOLVED; HAD CARDIAC EVAL   . Congestive heart failure 2011    related to preeclampsia  . Hyperthyroidism AGE 43    NO TX  . Hypothyroidism AGE 43     NO TX  . Pregnancy induced hypertension 2011  . Preterm labor 2011  . Infection     UTI OCC  . Infection 2013    TRICH  . Headache 2011    MIGRAQINES; OTC  . Asthma 2009; CORNERSTONE    INHALER PRN    Past Surgical History  Procedure Laterality Date  . Tonsillectomy    . Wisdom tooth extraction     . Tear duct probing    . Cholecystectomy      Family History  Problem Relation Age of Onset  . Anesthesia problems Neg Hx   . Other Neg Hx   . Asthma Brother   . Learning disabilities Brother   . Asthma Daughter   . Learning disabilities Daughter   . Seizures Daughter   . Learning disabilities Other     History  Substance Use Topics  . Smoking status: Passive Smoke Exposure - Never Smoker  . Smokeless tobacco: Never Used  . Alcohol Use: No    Allergies: No Known Allergies  Prescriptions prior to admission  Medication Sig Dispense Refill  . albuterol (PROVENTIL HFA;VENTOLIN HFA) 108 (90 BASE) MCG/ACT inhaler Inhale 2 puffs into the lungs every 6 (six) hours as needed for wheezing.  1 Inhaler  0  . metroNIDAZOLE (FLAGYL) 500 MG tablet Take 1 tablet (500 mg total) by mouth once.  4 tablet  0  . Prenatal Vit-Fe Fumarate-FA (PRENATAL COMPLETE) 14-0.4 MG TABS Take 1 tablet by mouth daily.      Marland Kitchen terconazole (TERAZOL 7) 0.4 % vaginal cream Place 1 applicator vaginally at bedtime.  45 g  0     Physical Exam   Temperature 97.9 F (36.6 C), temperature source Oral, last menstrual period 07/31/2012.  Chest clear Heart RRR without murmur Abd gravid, moderate tenderness over far right side--mild guarding, no rebound. +CVAT/flank pain on right--none on left side. Pelvic--deferred at present Ext WNL  FHR 150s, no decels No UCs on toco.  ED Course  IUP at 25 weeks Right side/flank pain  Plan: IV hydration Stadol and Zofran CBC, diff, CMP UA, urine to culture Right renal US. Will consult with Dr. Su Hilt after results obtained.  Nigel Bridgeman CNM, [place] 01/22/2013 5:31 PM  Addendum: Feeling much better after Stadol and Zofran--tolerating po fluids. No labs back yet, and renal US not done yet. Report to Sanda Klein, CNM--she will f/u on patient status. Report to Dr. Su Hilt regarding current patient status.  Nigel Bridgeman, CNM 01/22/13 6:50pm  Addendum: Patient  reports she has had cough for several weeks--making her back pain worse. Intolerant to liquid cough syrup. Will give Tessalon pearle and cough lozenges for her use.  Nigel Bridgeman, CNM 01/22/13 7:15p  Addendum: @ 2130  Pt denies any pain now    Results for orders placed during the hospital encounter of 01/22/13 (from the past 24 hour(s))  URINALYSIS, ROUTINE W REFLEX MICROSCOPIC     Status: Abnormal   Collection Time    01/22/13  5:10 PM      Result Value Range   Color, Urine YELLOW  YELLOW   APPearance HAZY (*) CLEAR   Specific Gravity, Urine >1.030 (*) 1.005 - 1.030   pH 6.0  5.0 - 8.0   Glucose, UA NEGATIVE  NEGATIVE mg/dL   Hgb urine dipstick MODERATE (*) NEGATIVE   Bilirubin Urine NEGATIVE  NEGATIVE   Ketones, ur 15 (*) NEGATIVE mg/dL   Protein, ur NEGATIVE  NEGATIVE mg/dL   Urobilinogen, UA 0.2  0.0 - 1.0 mg/dL   Nitrite NEGATIVE  NEGATIVE   Leukocytes, UA NEGATIVE  NEGATIVE  URINE MICROSCOPIC-ADD ON     Status: Abnormal   Collection Time    01/22/13  5:10 PM      Result Value Range   Squamous Epithelial / LPF FEW (*) RARE   WBC, UA 0-2  <3 WBC/hpf   RBC / HPF 3-6  <3 RBC/hpf   Bacteria, UA FEW (*) RARE  CBC WITH DIFFERENTIAL     Status: Abnormal   Collection Time    01/22/13  6:49 PM      Result Value Range   WBC 11.2 (*) 4.0 - 10.5 K/uL   RBC 3.34 (*) 3.87 - 5.11 MIL/uL   Hemoglobin 9.7 (*) 12.0 - 15.0 g/dL   HCT 16.1 (*) 09.6 - 04.5 %   MCV 88.0  78.0 - 100.0 fL   MCH 29.0  26.0 - 34.0 pg   MCHC 33.0  30.0 - 36.0 g/dL   RDW 40.9  81.1 - 91.4 %   Platelets 222  150 - 400 K/uL   Neutrophils Relative 76  43 - 77 %   Neutro Abs 8.6 (*) 1.7 - 7.7 K/uL   Lymphocytes Relative 15  12 - 46 %   Lymphs Abs 1.6  0.7 - 4.0 K/uL   Monocytes Relative 9  3 - 12 %   Monocytes Absolute 1.0  0.1 - 1.0 K/uL   Eosinophils Relative 1  0 - 5 %   Eosinophils Absolute 0.1  0.0 - 0.7 K/uL   Basophils Relative 0  0 -  1 %   Basophils Absolute 0.0  0.0 - 0.1 K/uL  COMPREHENSIVE  METABOLIC PANEL     Status: Abnormal   Collection Time    01/22/13  6:49 PM      Result Value Range   Sodium 133 (*) 135 - 145 mEq/L   Potassium 3.4 (*) 3.5 - 5.1 mEq/L   Chloride 101  96 - 112 mEq/L   CO2 21  19 - 32 mEq/L   Glucose, Bld 83  70 - 99 mg/dL   BUN 5 (*) 6 - 23 mg/dL   Creatinine, Ser 1.61 (*) 0.50 - 1.10 mg/dL   Calcium 8.0 (*) 8.4 - 10.5 mg/dL   Total Protein 5.5 (*) 6.0 - 8.3 g/dL   Albumin 2.3 (*) 3.5 - 5.2 g/dL   AST 10  0 - 37 U/L   ALT 8  0 - 35 U/L   Alkaline Phosphatase 87  39 - 117 U/L   Total Bilirubin 0.2 (*) 0.3 - 1.2 mg/dL   GFR calc non Af Amer >90  >90 mL/min   GFR calc Af Amer >90  >90 mL/min     .   US Renal  01/22/2013  *RADIOLOGY REPORT*  Clinical Data: Back and right flank pain.  [redacted] weeks pregnant. Prior cholecystectomy.  RENAL/URINARY TRACT ULTRASOUND COMPLETE  Comparison:  Abdominal ultrasound 11/17/2009.  Findings:  Right Kidney:  There is mild right-sided hydronephrosis.  This persists after voiding.  No focal renal abnormality is identified. Renal length 14.1 cm.  Left Kidney:  The left renal pelvis is mildly prominent without caliectasis.  This persists after voiding.  No focal renal abnormality is identified.  Renal length 14.1 cm.  Bladder:  Unremarkable for degree of distension.  Bilateral ureteral jets are visualized.  Post voiding, there is incomplete bladder emptying.  IMPRESSION:  1.  Right pelvocaliectasis and mild left pelviectasis, likely physiologic and secondary to pregnancy.  Bilateral ureteral jets are visualized. 2.  No calculus or focal renal abnormality identified.   Original Report Authenticated By: Carey Bullocks, M.D.     Impression: likely UTI, no evidence of kidney stone at present FHR cat 1 toco - quiet  P: d/c home stable condition rx for keflex 500mg  BID x5 days rx for vicodin 5/325mg  1-2 tabs q6h prn disp #30 F/u office as scheduled, or if symptoms don't improve rv'd [facility]KC, PTL, call if pain is severe, fever       Increase PO fluids

## 2013-01-22 NOTE — MAU Note (Signed)
Patient presents to MAU via [facility] with c/o of R lower back and R flank pain, radiating to R lower abdomen x 3 days; reports vomiting 4 times in last 24 hours; denies vaginal bleeding, LOF. Reports good fetal movement.

## 2013-01-24 ENCOUNTER — Ambulatory Visit: Payer: Medicaid Other | Admitting: Physical Therapy

## 2013-01-24 LAB — URINE CULTURE: Colony Count: NO GROWTH

## 2013-01-31 ENCOUNTER — Ambulatory Visit: Payer: Medicaid Other | Admitting: Physical Therapy

## 2013-02-06 ENCOUNTER — Ambulatory Visit: Payer: Medicaid Other | Admitting: Obstetrics and Gynecology

## 2013-02-06 ENCOUNTER — Encounter: Payer: Self-pay | Admitting: Obstetrics and Gynecology

## 2013-02-06 VITALS — BP 120/70 | Wt 177.0 lb

## 2013-02-06 DIAGNOSIS — Z331 Pregnant state, incidental: Secondary | ICD-10-CM

## 2013-02-06 LAB — HEMOGLOBIN: Hemoglobin: 10.6 g/dL — ABNORMAL LOW (ref 12.0–15.0)
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
## 2013-02-07 ENCOUNTER — Ambulatory Visit: Payer: Medicaid Other | Admitting: Physical Therapy

## 2013-02-07 LAB — GLUCOSE TOLERANCE, 1 HOUR (50G) W/O FASTING: Glucose, 1 Hour GTT: 97 mg/dL (ref 70–140)

## 2013-02-07 LAB — RPR

## 2013-02-14 ENCOUNTER — Ambulatory Visit: Payer: Medicaid Other | Admitting: Physical Therapy

## 2013-02-27 ENCOUNTER — Ambulatory Visit: Payer: Medicaid Other | Admitting: Obstetrics and Gynecology

## 2013-02-27 VITALS — BP 102/54 | Wt 178.0 lb

## 2013-02-27 NOTE — Progress Notes (Signed)
[redacted]w[redacted]d C/o low back pain, vaginal pressure and swelling in legs and is scared because they turned blue in last pregnancy Will check cvx and give pamphlet for back pain in preg and precautions and recs discussed for LEs Questions answered Cvx - int os closed, ext os feels multip FKCs GCT neg, RPR NR, and Hgb 10.6 RTO 2wks

## 2013-03-06 ENCOUNTER — Encounter (HOSPITAL_COMMUNITY): Payer: Self-pay

## 2013-03-06 ENCOUNTER — Inpatient Hospital Stay (HOSPITAL_COMMUNITY)
Admission: AD | Admit: 2013-03-06 | Discharge: 2013-03-06 | Disposition: A | Discharge status: Home / Self Care | Payer: Medicaid Other | Source: Ambulatory Visit | Attending: Obstetrics and Gynecology | Admitting: Obstetrics and Gynecology

## 2013-03-06 ENCOUNTER — Inpatient Hospital Stay (HOSPITAL_COMMUNITY): Payer: Medicaid Other

## 2013-03-06 ENCOUNTER — Other Ambulatory Visit: Payer: Self-pay | Admitting: Obstetrics and Gynecology

## 2013-03-06 DIAGNOSIS — R0602 Shortness of breath: Secondary | ICD-10-CM | POA: Insufficient documentation

## 2013-03-06 DIAGNOSIS — O99891 Other specified diseases and conditions complicating pregnancy: Secondary | ICD-10-CM | POA: Insufficient documentation

## 2013-03-06 DIAGNOSIS — O368190 Decreased fetal movements, unspecified trimester, not applicable or unspecified: Secondary | ICD-10-CM | POA: Insufficient documentation

## 2013-03-06 LAB — URINALYSIS, ROUTINE W REFLEX MICROSCOPIC
Bilirubin Urine: NEGATIVE
Glucose, UA: NEGATIVE mg/dL
Hgb urine dipstick: NEGATIVE
Protein, ur: NEGATIVE mg/dL
Specific Gravity, Urine: 1.025 (ref 1.005–1.030)
Urobilinogen, UA: 0.2 mg/dL (ref 0.0–1.0)

## 2013-03-06 LAB — COMPREHENSIVE METABOLIC PANEL
ALT: 8 U/L (ref 0–35)
AST: 9 U/L (ref 0–37)
CO2: 23 mEq/L (ref 19–32)
Chloride: 103 mEq/L (ref 96–112)
GFR calc non Af Amer: >90 mL/min (ref >90)
Sodium: 137 mEq/L (ref 135–145)
Total Bilirubin: 0.2 mg/dL — ABNORMAL LOW (ref 0.3–1.2)

## 2013-03-06 LAB — CBC WITH DIFFERENTIAL/PLATELET
Basophils Absolute: 0 10*3/uL (ref 0.0–0.1)
HCT: 31 % — ABNORMAL LOW (ref 36.0–46.0)
Lymphocytes Relative: 25 % (ref 12–46)
Neutro Abs: 6.6 10*3/uL (ref 1.7–7.7)
Neutrophils Relative %: 64 % (ref 43–77)
Platelets: 229 10*3/uL (ref 150–400)
RDW: 13.8 % (ref 11.5–15.5)
WBC: 10.4 10*3/uL (ref 4.0–10.5)

## 2013-03-06 MED ORDER — ALBUTEROL SULFATE HFA 108 (90 BASE) MCG/ACT IN AERS: 2.0000 | INHALATION_SPRAY | Freq: Four times a day (QID) | RESPIRATORY_TRACT | Status: DC | PRN

## 2013-03-06 MED ORDER — ALBUTEROL SULFATE (5 MG/ML) 0.5% IN NEBU
2.5000 mg | INHALATION_SOLUTION | Freq: Once | RESPIRATORY_TRACT | Status: AC
  Administered 2013-03-06: 2.5 mg via RESPIRATORY_TRACT
  Filled 2013-03-06: qty 0.5

## 2013-03-06 NOTE — MAU Note (Signed)
Pt reports having a hard time breathing for the last 3 days, worse at night time. Feels light-headed at times. Also reports decreased fetal movement since last pm. History of asthma.

## 2013-03-06 NOTE — MAU Note (Signed)
Patient states she feels much better after nebulizer treatment.

## 2013-03-06 NOTE — Progress Notes (Signed)
Error

## 2013-03-07 ENCOUNTER — Institutional Professional Consult (permissible substitution): Payer: Medicaid Other | Admitting: Pulmonary Disease
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
## 2013-03-08 ENCOUNTER — Ambulatory Visit (INDEPENDENT_AMBULATORY_CARE_PROVIDER_SITE_OTHER): Payer: Medicaid Other | Admitting: Pulmonary Disease

## 2013-03-08 ENCOUNTER — Encounter: Payer: Self-pay | Admitting: Pulmonary Disease

## 2013-03-08 VITALS — BP 122/78 | HR 113 | Temp 97.9°F | Ht 60.0 in | Wt 180.8 lb

## 2013-03-08 DIAGNOSIS — Z9189 Other specified personal risk factors, not elsewhere classified: Secondary | ICD-10-CM

## 2013-03-08 DIAGNOSIS — J45909 Unspecified asthma, uncomplicated: Secondary | ICD-10-CM

## 2013-03-08 DIAGNOSIS — Z7722 Contact with and (suspected) exposure to environmental tobacco smoke (acute) (chronic): Secondary | ICD-10-CM

## 2013-03-08 MED ORDER — BUDESONIDE 180 MCG/ACT IN AEPB: 2.0000 | INHALATION_SPRAY | Freq: Two times a day (BID) | RESPIRATORY_TRACT | Status: DC

## 2013-03-08 NOTE — Progress Notes (Signed)
Chief Complaint  Patient presents with  . Advice Only    c/o SOB w/ activity. Pt has asthma. no wheezing, no chest tx, no cough    History of Present Illness: Brenda Spencer is a 22 y.o. female for evaluation of asthma.  She is [redacted] weeks pregnant.  This is her 3rd pregnancy.  She developed asthma during her 2nd pregnancy. She has a strong family history of asthma (Brother, Daughter).  She did not have much symptoms until recently.  She now has cough with wheezing in her chest.  This is associated with dyspnea with exertion.  She also has more trouble with hot weather, and windy weather.  She does not usually produce sputum.  She denies allergies, but does get eczema.  She has never needed to use prednisone.  She has an albuterol inhaler, and has been using this more frequently >> this helps her breathing.  She notices more trouble at night, especially when she lays flat.  She is not having reflux.  Her pregnancy otherwise has been uncomplicated.  She is from [place].  She is not working.  She denies animal or sick exposures.  She does not smoke, but every one in her home smokes.  She does not recall having breathing tests before.  Tests: CT chest 02/08/11 >> normal  Brenda Spencer  has a past medical history of Eczema; CHF (congestive heart failure); Congestive heart failure (2011); Hyperthyroidism (AGE 78); Hypothyroidism (AGE 79 ); Pregnancy induced hypertension (2011); Preterm labor (2011); Infection; Infection (2013); Headache (2011); and Asthma (2009; [facility]STONE).  Brenda Spencer  has past surgical history that includes Tonsillectomy; Wisdom tooth extraction; Tear duct probing; and Cholecystectomy.  Prior to Admission medications   Medication Sig Start Date End Date Taking? Authorizing Provider  albuterol (PROVENTIL HFA;VENTOLIN HFA) 108 (90 BASE) MCG/ACT inhaler Inhale 2 puffs into the lungs every 6 (six) hours as needed for wheezing. 03/06/13  Yes Malissa Hippo,  CNM  Prenatal Vit-Fe Fumarate-FA (PRENATAL MULTIVITAMIN) TABS Take 1 tablet by mouth daily.   Yes Historical Provider, MD    No Known Allergies  family history includes Asthma in her brother and daughter; Colon cancer in her maternal grandmother; Learning disabilities in her brother, daughter, and other; and Seizures in her daughter.  There is no history of Anesthesia problems and Other.   reports that she has never smoked. She has never used smokeless tobacco. She reports that she does not drink alcohol or use illicit drugs.  Review of Systems  Constitutional: Negative for appetite change and unexpected weight change.  HENT: Negative for ear pain, congestion, sore throat, sneezing, trouble swallowing and dental problem.   Respiratory: Positive for shortness of breath. Negative for cough.   Cardiovascular: Positive for chest pain. Negative for palpitations and leg swelling.  Gastrointestinal: Negative for abdominal pain.  Musculoskeletal: Negative for joint swelling.  Skin: Negative for rash.  Neurological: Negative for headaches.  Psychiatric/Behavioral: Negative for dysphoric mood. The patient is not nervous/anxious.    Physical Exam:  General - No distress ENT - No sinus tenderness, no oral exudate, no LAN, no thyromegaly, TM clear, pupils equal/reactive Cardiac - s1s2 regular, no murmur, pulses symmetric Chest - No wheeze/rales/dullness, good air entry, normal respiratory excursion Back - No focal tenderness Abd - Soft, non-tender, gravid uterus Ext - No edema Neuro - Normal strength, cranial nerves intact Skin - No rashes Psych - Normal mood, and behavior   03/06/2013  *RADIOLOGY REPORT*  Clinical Data: Shortness  of breath.  CHEST - 2 VIEW   Comparison: 01/28/2011   Findings: Heart size and pulmonary vascularity are normal and the lungs are clear.  No effusions.  Minimal thoracic scoliosis, stable.   IMPRESSION: No acute abnormalities.    Original Report Authenticated By:  Francene Boyers, M.D.     Lab Results  Component Value Date   WBC 10.4 03/06/2013   HGB 10.1* 03/06/2013   HCT 31.0* 03/06/2013   MCV 84.9 03/06/2013   PLT 229 03/06/2013    Lab Results  Component Value Date   CREATININE 0.39* 03/06/2013   BUN 3* 03/06/2013   NA 137 03/06/2013   K 3.6 03/06/2013   CL 103 03/06/2013   CO2 23 03/06/2013    Lab Results  Component Value Date   ALT 8 03/06/2013   AST 9 03/06/2013   ALKPHOS 114 03/06/2013   BILITOT 0.2* 03/06/2013    Assessment/Plan:  Coralyn Helling, MD [place] [facility]/Sleep Pager:  [phone] 03/08/2013, 2:17 PM

## 2013-03-08 NOTE — Progress Notes (Deleted)
  Subjective:    Patient ID: Brenda Spencer, female    DOB: 12-05-1991, 22 y.o.   MRN: [id number]  HPI    Review of Systems  Constitutional: Negative for appetite change and unexpected weight change.  HENT: Negative for ear pain, congestion, sore throat, sneezing, trouble swallowing and dental problem.   Respiratory: Positive for shortness of breath. Negative for cough.   Cardiovascular: Positive for chest pain. Negative for palpitations and leg swelling.  Gastrointestinal: Negative for abdominal pain.  Musculoskeletal: Negative for joint swelling.  Skin: Negative for rash.  Neurological: Negative for headaches.  Psychiatric/Behavioral: Negative for dysphoric mood. The patient is not nervous/anxious.        Objective:   Physical Exam        Assessment & Plan:

## 2013-03-08 NOTE — Patient Instructions (Signed)
Pulmicort two puffs twice per day, and rinse mouth after each use Albuterol two puffs as needed for cough, wheeze, or chest congestion Follow up in 3 to 4 weeks

## 2013-03-09 DIAGNOSIS — Z7722 Contact with and (suspected) exposure to environmental tobacco smoke (acute) (chronic): Secondary | ICD-10-CM | POA: Insufficient documentation

## 2013-03-09 NOTE — Assessment & Plan Note (Signed)
She has history of asthma.  She has progressive symptoms consistent with asthma, more frequent albuterol inhaler use.  Will add budesonide to her inhaler regimen, and continue as needed albuterol.  Will follow up in 3 weeks to closely monitor her asthma control during remainder of pregnancy.  She is not planning on breast feeding after delivery.  Will re-assess her asthma further after her delivery.

## 2013-03-09 NOTE — Assessment & Plan Note (Signed)
This clearly is an issue with her asthma control as well as having impact on her babies health.

## 2013-03-19 ENCOUNTER — Inpatient Hospital Stay (HOSPITAL_COMMUNITY)
Admission: AD | Admit: 2013-03-19 | Discharge: 2013-03-20 | Disposition: A | Discharge status: Home / Self Care | Payer: Medicaid Other | Source: Ambulatory Visit | Attending: Obstetrics and Gynecology | Admitting: Obstetrics and Gynecology

## 2013-03-19 DIAGNOSIS — M545 Low back pain, unspecified: Secondary | ICD-10-CM | POA: Insufficient documentation

## 2013-03-19 DIAGNOSIS — R109 Unspecified abdominal pain: Secondary | ICD-10-CM | POA: Insufficient documentation

## 2013-03-19 DIAGNOSIS — O212 Late vomiting of pregnancy: Secondary | ICD-10-CM | POA: Insufficient documentation

## 2013-03-19 DIAGNOSIS — R197 Diarrhea, unspecified: Secondary | ICD-10-CM | POA: Insufficient documentation

## 2013-03-19 NOTE — MAU Note (Signed)
Pt G3 P1 at 33wks reports vomiting x 3 days, diarrhea tonight.  Lower back pain and stomach cramping x 4 days.  Unable to keep anything down.

## 2013-03-20 ENCOUNTER — Encounter (HOSPITAL_COMMUNITY): Payer: Self-pay | Admitting: *Deleted

## 2013-03-20 LAB — CBC WITH DIFFERENTIAL/PLATELET
Basophils Absolute: 0 10*3/uL (ref 0.0–0.1)
Basophils Relative: 0 % (ref 0–1)
HCT: 32.9 % — ABNORMAL LOW (ref 36.0–46.0)
Lymphocytes Relative: 11 % — ABNORMAL LOW (ref 12–46)
MCHC: 33.1 g/dL (ref 30.0–36.0)
Monocytes Absolute: 0.9 10*3/uL (ref 0.1–1.0)
Neutro Abs: 7.4 10*3/uL (ref 1.7–7.7)
Neutrophils Relative %: 80 % — ABNORMAL HIGH (ref 43–77)
Platelets: 252 10*3/uL (ref 150–400)
RDW: 14.1 % (ref 11.5–15.5)
WBC: 9.3 10*3/uL (ref 4.0–10.5)

## 2013-03-20 LAB — URINE MICROSCOPIC-ADD ON

## 2013-03-20 LAB — URINALYSIS, ROUTINE W REFLEX MICROSCOPIC
Glucose, UA: NEGATIVE mg/dL
Hgb urine dipstick: NEGATIVE
Protein, ur: 100 mg/dL — AB
pH: 6 (ref 5.0–8.0)

## 2013-03-20 MED ORDER — ONDANSETRON HCL 4 MG/2ML IJ SOLN
4.0000 mg | Freq: Once | INTRAMUSCULAR | Status: AC
  Administered 2013-03-20: 4 mg via INTRAVENOUS
  Filled 2013-03-20: qty 2

## 2013-03-20 MED ORDER — DIPHENOXYLATE-ATROPINE 2.5-0.025 MG PO TABS
2.0000 | ORAL_TABLET | Freq: Once | ORAL | Status: AC
  Administered 2013-03-20: 2 via ORAL
  Filled 2013-03-20: qty 2

## 2013-03-20 MED ORDER — ONDANSETRON 8 MG PO TBDP: 8.0000 mg | ORAL_TABLET | Freq: Three times a day (TID) | ORAL | Status: DC | PRN

## 2013-03-20 MED ORDER — BUTORPHANOL TARTRATE 1 MG/ML IJ SOLN
1.0000 mg | Freq: Once | INTRAMUSCULAR | Status: AC
  Administered 2013-03-20: 1 mg via INTRAVENOUS
  Filled 2013-03-20: qty 1

## 2013-03-20 MED ORDER — DEXTROSE 5 % IN LACTATED RINGERS IV BOLUS
500.0000 mL | Freq: Once | INTRAVENOUS | Status: AC
  Administered 2013-03-20: 500 mL via INTRAVENOUS

## 2013-03-20 NOTE — MAU Provider Note (Signed)
History     CSN: [id number]  Arrival date and time: 03/19/13 2355   First Provider Initiated Contact with Patient 03/20/13 0141      Chief Complaint  Patient presents with  . Emesis  . Diarrhea   HPI Comments: Pt is a G3P1 at 33wks w cc N/V/D, denies any VB or LOF, reports GFM. Denies dysuria, denies any ctx, reports family members had same sx's a few days ago.      Past Medical History  Diagnosis Date  . Eczema   . CHF (congestive heart failure)     RESOLVED; HAD CARDIAC EVAL   . Congestive heart failure 2011    related to preeclampsia  . Hyperthyroidism AGE 55    NO TX  . Hypothyroidism AGE 55     NO TX  . Pregnancy induced hypertension 2011  . Preterm labor 2011  . Infection     UTI OCC  . Infection 2013    TRICH  . Headache 2011    MIGRAQINES; OTC  . Asthma 2009; [facility]STONE    INHALER PRN    Past Surgical History  Procedure Laterality Date  . Tonsillectomy    . Wisdom tooth extraction    . Tear duct probing    . Cholecystectomy      Family History  Problem Relation Age of Onset  . Anesthesia problems Neg Hx   . Other Neg Hx   . Asthma Brother   . Learning disabilities Brother   . Asthma Daughter   . Learning disabilities Daughter   . Seizures Daughter   . Learning disabilities Other   . Colon cancer Maternal Grandmother     History  Substance Use Topics  . Smoking status: Never Smoker   . Smokeless tobacco: Never Used  . Alcohol Use: No    Allergies: No Known Allergies  Prescriptions prior to admission  Medication Sig Dispense Refill  . albuterol (PROVENTIL HFA;VENTOLIN HFA) 108 (90 BASE) MCG/ACT inhaler Inhale 2 puffs into the lungs every 6 (six) hours as needed for wheezing.  1 Inhaler  0  . HYDROcodone-acetaminophen (NORCO/VICODIN) 5-325 MG per tablet Take 1 tablet by mouth every 6 (six) hours as needed for pain.      . Prenatal Vit-Fe Fumarate-FA (PRENATAL MULTIVITAMIN) TABS Take 1 tablet by mouth daily.      . budesonide  (PULMICORT FLEXHALER) 180 MCG/ACT inhaler Inhale 2 puffs into the lungs 2 (two) times daily.  1 Inhaler  4    Review of Systems  Constitutional: Negative for fever.  HENT: Negative for congestion and sore throat.   Respiratory: Negative for cough and shortness of breath.   Cardiovascular: Negative for chest pain.  Gastrointestinal: Positive for nausea, vomiting, abdominal pain and diarrhea. Negative for heartburn.  Genitourinary: Negative for dysuria and urgency.  Musculoskeletal: Negative for myalgias and back pain.  Neurological: Negative for headaches.   Physical Exam   Blood pressure 120/86, pulse 94, temperature 97.7 F (36.5 C), resp. rate 18, height 5\' 1"  (1.549 m), weight 175 lb 6.4 oz (79.561 kg), last menstrual period 07/31/2012.  Physical Exam  Nursing note and vitals reviewed. Constitutional: She is oriented to person, place, and time. She appears well-developed and well-nourished. She appears distressed.  HENT:  Head: Normocephalic.  Eyes: Pupils are equal, round, and reactive to light.  Neck: Normal range of motion.  Cardiovascular: Normal rate, regular rhythm and normal heart sounds.   Respiratory: Effort normal and breath sounds normal.  GI: Soft. Bowel sounds are  normal.  Genitourinary: Vagina normal.  Vag exam cl/th/high  Musculoskeletal: Normal range of motion.  Neurological: She is alert and oriented to person, place, and time. She has normal reflexes.  Skin: Skin is warm and dry.  Psychiatric: She has a normal mood and affect. Her behavior is normal.   FHR cat 1 toco quiet    MAU Course  Procedures IVF's Stadol zofran Lomotil PO   Assessment and Plan  IUP at 33wks Likely viral illness FHR reassuring Pt feels markedly improved after IVF's and meds,  tol light PO, ready for discharge dc'd home stable w rx zofran and lomotil, recommend BRAT diet for 24hrs f/u routine   Brenda Spencer M 03/20/2013, 1:50 AM

## 2013-03-21 LAB — URINE CULTURE: Colony Count: 3000

## 2013-04-04 ENCOUNTER — Encounter: Payer: Self-pay | Admitting: Pulmonary Disease

## 2013-04-04 ENCOUNTER — Ambulatory Visit (INDEPENDENT_AMBULATORY_CARE_PROVIDER_SITE_OTHER): Payer: Medicaid Other | Admitting: Pulmonary Disease

## 2013-04-04 VITALS — BP 118/70 | HR 118 | Temp 98.1°F | Ht 61.0 in | Wt 178.0 lb

## 2013-04-04 DIAGNOSIS — J4530 Mild persistent asthma, uncomplicated: Secondary | ICD-10-CM

## 2013-04-04 DIAGNOSIS — J45909 Unspecified asthma, uncomplicated: Secondary | ICD-10-CM

## 2013-04-04 NOTE — Patient Instructions (Signed)
Follow up in 8 weeks 

## 2013-04-04 NOTE — Progress Notes (Signed)
Chief Complaint  Patient presents with  . Follow-up    Pt reports breathing is going well-- denies any new concerns at this time    History of Present Illness: Brenda Spencer is a 22 y.o. female [redacted] weeks pregnant with hx of asthma.  She has been doing better since starting pulmicort.  She no longer needs to use albuterol.  She is doing well with her pregnancy.  She was seen by Ob earlier today, and was told she is 2 cm dilated.  She is having contraction every 2 to 3 hours.  She is not having cough, wheeze, or sputum.  She has noticed some soreness in her right ear.  She denies sinus congestion, post nasal drip, or sore throat.  TESTS: CT chest 02/08/11 >> normal  Tyson D Vanvoorhis  has a past medical history of Eczema; CHF (congestive heart failure); Congestive heart failure (2011); Hyperthyroidism (AGE 27); Hypothyroidism (AGE 28 ); Pregnancy induced hypertension (2011); Preterm labor (2011); Infection; Infection (2013); Headache (2011); and Asthma (2009; [facility]).  Lodie D Creely  has past surgical history that includes Tonsillectomy; Wisdom tooth extraction; Tear duct probing; and Cholecystectomy.  Prior to Admission medications   Medication Sig Start Date End Date Taking? Authorizing Provider  albuterol (PROVENTIL HFA;VENTOLIN HFA) 108 (90 BASE) MCG/ACT inhaler Inhale 2 puffs into the lungs every 6 (six) hours as needed for wheezing. 03/06/13  Yes Malissa Hippo, CNM  budesonide (PULMICORT FLEXHALER) 180 MCG/ACT inhaler Inhale 2 puffs into the lungs 2 (two) times daily. 03/08/13  Yes Coralyn Helling, MD  HYDROcodone-acetaminophen (NORCO/VICODIN) 5-325 MG per tablet Take 1 tablet by mouth every 6 (six) hours as needed for pain.   Yes Historical Provider, MD  ondansetron (ZOFRAN ODT) 8 MG disintegrating tablet Take 1 tablet (8 mg total) by mouth every 8 (eight) hours as needed for nausea. 03/20/13  Yes Malissa Hippo, CNM  Prenatal Vit-Fe Fumarate-FA (PRENATAL MULTIVITAMIN)  TABS Take 1 tablet by mouth daily.   Yes Historical Provider, MD    No Known Allergies   Physical Exam:  General - No distress ENT - No sinus tenderness, no oral exudate, TM clear, no LAN Cardiac - s1s2 regular, no murmur Chest - No wheeze/rales/dullness Back - No focal tenderness Abd - Gravid uterus Ext - No edema Neuro - Normal strength Skin - No rashes Psych - normal mood, and behavior   Assessment/Plan:  Coralyn Helling, MD [place] [facility]/Critical [facility]/Sleep Pager:  [phone]

## 2013-04-04 NOTE — Assessment & Plan Note (Signed)
She has improved since being on pulmicort and not needing albuterol as much.  Will plan to f/u after her delivery and re-assess her asthma control.

## 2013-04-06 ENCOUNTER — Encounter (HOSPITAL_COMMUNITY): Payer: Self-pay | Admitting: *Deleted

## 2013-04-06 ENCOUNTER — Inpatient Hospital Stay (HOSPITAL_COMMUNITY)
Admission: AD | Admit: 2013-04-06 | Discharge: 2013-04-06 | Disposition: A | Discharge status: Home / Self Care | Payer: Medicaid Other | Source: Ambulatory Visit | Attending: Obstetrics and Gynecology | Admitting: Obstetrics and Gynecology

## 2013-04-06 DIAGNOSIS — O4700 False labor before 37 completed weeks of gestation, unspecified trimester: Secondary | ICD-10-CM | POA: Insufficient documentation

## 2013-04-06 DIAGNOSIS — O99891 Other specified diseases and conditions complicating pregnancy: Secondary | ICD-10-CM | POA: Insufficient documentation

## 2013-04-06 DIAGNOSIS — R109 Unspecified abdominal pain: Secondary | ICD-10-CM | POA: Insufficient documentation

## 2013-04-06 LAB — URINALYSIS, ROUTINE W REFLEX MICROSCOPIC
Bilirubin Urine: NEGATIVE
Nitrite: NEGATIVE
Specific Gravity, Urine: 1.025 (ref 1.005–1.030)
Urobilinogen, UA: 1 mg/dL (ref 0.0–1.0)
pH: 6 (ref 5.0–8.0)

## 2013-04-06 LAB — URINE MICROSCOPIC-ADD ON

## 2013-04-06 NOTE — MAU Note (Addendum)
PT SAYS  HER BACK HURTS- STARTED LAST NIGHT-  WORSE DURING DAY- TRIED TO HAVE BM- NO RESULTS.   ON Tuesday WENT TO DR- Queen Blossom MUCUS PLUG CAME OUT- SAW DR Stefano Gaul- TOLD HER - HER WATER BROKE A LITTLE BIT-    HE  DID VE - 1-2 CM.     DENIES HSV AND MRSA.  FELT SOME UC AT HOME.   SAYS 2 WEEKS AGO   THOUGHT SHE WAS LEAKING- NEVER A GUSH.

## 2013-04-06 NOTE — MAU Provider Note (Signed)
History   22 yo G3P0111 at 35 4/7 weeks presented after calling c/o ? leaking and cramping.  Saw Dr. Stefano Gaul this week and was told she was "leaking from the bag" and was 1-2 cm dilated, and that "she would likely deliver this week".  Upon review of the notes, no documentation of leaking of amniotic fluid was noted, but patient did have discharge in the vault. Denies any gush of fluid, reports cramping at intervals, reports +FM.  Patient Active Problem List  Diagnosis  . Mild persistent asthma  . UTI (urinary tract infection) during pregnancy  . Trichomonas vaginalis (TV) infection  . Hx of preeclampsia, prior pregnancy, currently pregnant  . History of congestive heart failure  . S/P laparoscopic cholecystectomy  . Second hand tobacco smoke exposure  Delivered 1st baby at 35 weeks due to induction for pre-eclampsia. Had CHF pp. Asthma has been managed by [facility], with visit this week with Dr. Marinus Maw.    OB History   Grav Para Term Preterm Abortions TAB SAB Ect Mult Living   3 1  1 1  1   1      Obstetric Comments   2011 PIH;IOL; PP CHF-HOSPITALIZED X 1 WEEK      Past Medical History  Diagnosis Date  . Eczema   . CHF (congestive heart failure)     RESOLVED; HAD CARDIAC EVAL   . Congestive heart failure 2011    related to preeclampsia  . Hyperthyroidism AGE 66    NO TX  . Hypothyroidism AGE 66     NO TX  . Pregnancy induced hypertension 2011  . Preterm labor 2011  . Infection     UTI OCC  . Infection 2013    TRICH  . Headache 2011    MIGRAQINES; OTC  . Asthma 2009; [facility]ONE    INHALER PRN    Past Surgical History  Procedure Laterality Date  . Tonsillectomy    . Wisdom tooth extraction    . Tear duct probing    . Cholecystectomy      Family History  Problem Relation Age of Onset  . Anesthesia problems Neg Hx   . Other Neg Hx   . Asthma Brother   . Learning disabilities Brother   . Asthma Daughter   . Learning disabilities Daughter    . Seizures Daughter   . Learning disabilities Other   . Colon cancer Maternal Grandmother     History  Substance Use Topics  . Smoking status: Never Smoker   . Smokeless tobacco: Never Used  . Alcohol Use: No    Allergies: No Known Allergies  Prescriptions prior to admission  Medication Sig Dispense Refill  . albuterol (PROVENTIL HFA;VENTOLIN HFA) 108 (90 BASE) MCG/ACT inhaler Inhale 2 puffs into the lungs every 6 (six) hours as needed for wheezing.  1 Inhaler  0  . budesonide (PULMICORT FLEXHALER) 180 MCG/ACT inhaler Inhale 2 puffs into the lungs 2 (two) times daily.  1 Inhaler  4  . HYDROcodone-acetaminophen (NORCO/VICODIN) 5-325 MG per tablet Take 1 tablet by mouth every 6 (six) hours as needed for pain.      . Prenatal Vit-Fe Fumarate-FA (PRENATAL MULTIVITAMIN) TABS Take 1 tablet by mouth daily.         Physical Exam   Blood pressure 116/83, pulse 115, temperature 97.5 F (36.4 C), temperature source Oral, resp. rate 20, height 5\' 1"  (1.549 m), weight 178 lb (80.74 kg), last menstrual period 07/31/2012.  Chest clear Heart RRR without murmur  Abd gravid, NT Pelvic--small amount white d/c in vault, no pooling noted.  Cervix 2 cm, 70%, vtx, -1. Ext WNL  FHR reactive, no decels UCs irritability pattern, no organized contractions.  Patient grimaces with FM.    ED Course  IUP at 35 4/7 weeks ? Leaking Uterine activity  Plan: Amnisure GBS, GC, chlamydia, wet prep UA Reviewed discussion patient had with Dr. Stefano Gaul, including likelihood if he felt she was actually leaking fluid from the vagina, she would have been admitted at that time, as well as the inability to definitively predict when patient would go into labor.  Patient seems to understand we are testing definitively for leaking fluid tonight and if negative, will likely send patient home unless labor is dx.  Nigel Bridgeman, CNM 04/06/13 10:30p   Nigel Bridgeman CNM, [place] 04/06/2013 10:24 PM  Addendum: FFN  negative. FHR remained reactive. No significant contractions.  D/C home with labor precautions Keep scheduled appt on Monday, 04/10/13, or call prn. Discussed s/s of SROM and labor--reassured patient that no evidence of SROM exists.  Nigel Bridgeman, CNM 04/06/13 11:10p

## 2013-04-07 LAB — GC/CHLAMYDIA PROBE AMP: GC Probe RNA: NEGATIVE

## 2013-04-09 LAB — CULTURE, BETA STREP (GROUP B ONLY)

## 2013-04-11 ENCOUNTER — Encounter (HOSPITAL_COMMUNITY): Payer: Self-pay | Admitting: *Deleted

## 2013-04-11 ENCOUNTER — Inpatient Hospital Stay (HOSPITAL_COMMUNITY)
Admission: AD | Admit: 2013-04-11 | Discharge: 2013-04-11 | Disposition: A | Discharge status: Hospice | Payer: Medicaid Other | Source: Ambulatory Visit | Attending: Obstetrics and Gynecology | Admitting: Obstetrics and Gynecology

## 2013-04-11 DIAGNOSIS — O479 False labor, unspecified: Secondary | ICD-10-CM | POA: Insufficient documentation

## 2013-04-11 DIAGNOSIS — O4703 False labor before 37 completed weeks of gestation, third trimester: Secondary | ICD-10-CM

## 2013-04-11 MED ORDER — CYCLOBENZAPRINE HCL 10 MG PO TABS
10.0000 mg | ORAL_TABLET | Freq: Once | ORAL | Status: AC
  Administered 2013-04-11: 10 mg via ORAL
  Filled 2013-04-11: qty 1

## 2013-04-11 MED ORDER — ACETAMINOPHEN-CODEINE #3 300-30 MG PO TABS
1.0000 | ORAL_TABLET | Freq: Once | ORAL | Status: AC
  Administered 2013-04-11: 1 via ORAL
  Filled 2013-04-11: qty 1

## 2013-04-27 ENCOUNTER — Encounter (HOSPITAL_COMMUNITY): Payer: Self-pay | Admitting: *Deleted

## 2013-04-27 ENCOUNTER — Inpatient Hospital Stay (HOSPITAL_COMMUNITY): Payer: Medicaid Other

## 2013-04-27 ENCOUNTER — Inpatient Hospital Stay (HOSPITAL_COMMUNITY)
Admission: AD | Admit: 2013-04-27 | Discharge: 2013-04-27 | Disposition: A | Discharge status: Home / Self Care | Payer: Medicaid Other | Source: Ambulatory Visit | Attending: Obstetrics and Gynecology | Admitting: Obstetrics and Gynecology

## 2013-04-27 DIAGNOSIS — O479 False labor, unspecified: Secondary | ICD-10-CM | POA: Insufficient documentation

## 2013-04-27 DIAGNOSIS — R0602 Shortness of breath: Secondary | ICD-10-CM | POA: Insufficient documentation

## 2013-04-27 DIAGNOSIS — E876 Hypokalemia: Secondary | ICD-10-CM | POA: Insufficient documentation

## 2013-04-27 DIAGNOSIS — R079 Chest pain, unspecified: Secondary | ICD-10-CM | POA: Insufficient documentation

## 2013-04-27 DIAGNOSIS — O99891 Other specified diseases and conditions complicating pregnancy: Secondary | ICD-10-CM | POA: Insufficient documentation

## 2013-04-27 LAB — URINALYSIS, ROUTINE W REFLEX MICROSCOPIC
Glucose, UA: NEGATIVE mg/dL
Hgb urine dipstick: NEGATIVE
Specific Gravity, Urine: 1.015 (ref 1.005–1.030)
pH: 7 (ref 5.0–8.0)

## 2013-04-27 LAB — CBC WITH DIFFERENTIAL/PLATELET
Basophils Absolute: 0 10*3/uL (ref 0.0–0.1)
Basophils Relative: 0 % (ref 0–1)
Eosinophils Relative: 1 % (ref 0–5)
HCT: 30 % — ABNORMAL LOW (ref 36.0–46.0)
Lymphocytes Relative: 20 % (ref 12–46)
MCHC: 32 g/dL (ref 30.0–36.0)
MCV: 78.9 fL (ref 78.0–100.0)
Monocytes Absolute: 0.8 10*3/uL (ref 0.1–1.0)
Neutro Abs: 7.9 10*3/uL — ABNORMAL HIGH (ref 1.7–7.7)
Platelets: 229 10*3/uL (ref 150–400)
RDW: 15.1 % (ref 11.5–15.5)
WBC: 10.8 10*3/uL — ABNORMAL HIGH (ref 4.0–10.5)

## 2013-04-27 LAB — COMPREHENSIVE METABOLIC PANEL
ALT: 6 U/L (ref 0–35)
AST: 11 U/L (ref 0–37)
Albumin: 2.4 g/dL — ABNORMAL LOW (ref 3.5–5.2)
CO2: 21 mEq/L (ref 19–32)
Calcium: 8.5 mg/dL (ref 8.4–10.5)
Creatinine, Ser: 0.36 mg/dL — ABNORMAL LOW (ref 0.50–1.10)
Sodium: 135 mEq/L (ref 135–145)

## 2013-04-27 LAB — URINE MICROSCOPIC-ADD ON

## 2013-04-27 LAB — AMNISURE RUPTURE OF MEMBRANE (ROM) NOT AT ARMC: Amnisure ROM: NEGATIVE

## 2013-04-27 LAB — URIC ACID: Uric Acid, Serum: 3.5 mg/dL (ref 2.4–7.0)

## 2013-04-27 MED ORDER — LACTATED RINGERS IV SOLN
INTRAVENOUS | Status: DC
  Administered 2013-04-27: 12:00:00 via INTRAVENOUS

## 2013-04-27 MED ORDER — BUTORPHANOL TARTRATE 1 MG/ML IJ SOLN
1.0000 mg | Freq: Once | INTRAMUSCULAR | Status: AC
  Administered 2013-04-27: 1 mg via INTRAVENOUS
  Filled 2013-04-27: qty 1

## 2013-04-27 MED ORDER — POTASSIUM CHLORIDE ER 10 MEQ PO TBCR: 20.0000 meq | EXTENDED_RELEASE_TABLET | Freq: Two times a day (BID) | ORAL | Status: DC

## 2013-04-27 MED ORDER — HYDROCODONE-ACETAMINOPHEN 5-325 MG PO TABS: 1.0000 | ORAL_TABLET | Freq: Four times a day (QID) | ORAL | Status: DC | PRN

## 2013-04-27 NOTE — MAU Provider Note (Signed)
History   22 yo [id number] at 39 4/7 weeks presented after calling office c/o chest pain and SOB beginning at 2:45am.  Denies cough, hemoptysis, N/V, fever, anxiety, reflux, or any other sx.  Having some mild contractions, reports +FM.   Cervix has been 3 cm on last office visit on 5/12--wants membranes swept ASAP.  Patient was more concerned regarding sx due to hx of CHF after last delivery--was induced at 35 weeks for [facility], then had CHF dx pp.  "Thought it was an asthma attack", but was dx with CHF.  Does have hx of mild asthma with routine maintenance meds, but does not feel like these current sx are related to asthma.  Reports chest pain is constant, worse with movement or lying flat.  SOB is worse when lying flat, but no worse with ambulation.  Patient Active Problem List   Diagnosis Date Noted  . Second hand tobacco smoke exposure 03/09/2013  . S/P laparoscopic cholecystectomy 02/06/2013  . Hx of preeclampsia, prior pregnancy, currently pregnant 01/09/2013  . History of congestive heart failure 01/09/2013  . Mild persistent asthma 11/18/2012  . UTI (urinary tract infection) during pregnancy 11/18/2012  . Trichomonas vaginalis (TV) infection 11/18/2012     Chief Complaint  Patient presents with  . Chest Pain   @SFHPI @  OB History   Grav Para Term Preterm Abortions TAB SAB Ect Mult Living   3 1  1 1  1   1      Obstetric Comments   2011 PIH;IOL; PP CHF-HOSPITALIZED X 1 WEEK      Past Medical History  Diagnosis Date  . Eczema   . CHF (congestive heart failure)     RESOLVED; HAD CARDIAC EVAL   . Congestive heart failure 2011    related to preeclampsia  . Hyperthyroidism AGE 35    NO TX  . Hypothyroidism AGE 35     NO TX  . Pregnancy induced hypertension 2011  . Preterm labor 2011  . Infection     UTI OCC  . Infection 2013    TRICH  . Headache 2011    MIGRAQINES; OTC  . Asthma 2009; CORNERSTONE    INHALER PRN    Past Surgical History  Procedure Laterality Date   . Tonsillectomy    . Wisdom tooth extraction    . Tear duct probing    . Cholecystectomy      Family History  Problem Relation Age of Onset  . Anesthesia problems Neg Hx   . Other Neg Hx   . Asthma Brother   . Learning disabilities Brother   . Asthma Daughter   . Learning disabilities Daughter   . Seizures Daughter   . Learning disabilities Other   . Colon cancer Maternal Grandmother     History  Substance Use Topics  . Smoking status: Never Smoker   . Smokeless tobacco: Never Used  . Alcohol Use: No    Allergies: No Known Allergies  Prescriptions prior to admission  Medication Sig Dispense Refill  . budesonide (PULMICORT FLEXHALER) 180 MCG/ACT inhaler Inhale 2 puffs into the lungs 2 (two) times daily.  1 Inhaler  4  . Prenatal Vit-Fe Fumarate-FA (PRENATAL MULTIVITAMIN) TABS Take 1 tablet by mouth daily.      Marland Kitchen albuterol (PROVENTIL HFA;VENTOLIN HFA) 108 (90 BASE) MCG/ACT inhaler Inhale 2 puffs into the lungs every 6 (six) hours as needed for wheezing.  1 Inhaler  0     Physical Exam   Blood pressure 137/85, pulse  114, temperature 98.3 F (36.8 C), temperature source Oral, resp. rate 18, height 5\' 1"  (1.549 m), weight 178 lb (80.74 kg), last menstrual period 07/31/2012.  Chest clear--patient reports pain on palpation of chest, primarily on left side.   Heart RRR without murmur--slightly tachycardic. Abd gravid, NT Pelvic--cervix posterior, loose 3 cm, 70%, vtx, -1. Ext DTR 1-2+, no clonus, no edema.  FHR Category 1 UCs irregular, mild.    ED Course  IUP at 38 4/7 weeks Chest pain and SOB--past hx CHF after induction for pre-eclampsia  Plan: Consulted with Dr. Su Hilt EKG CXR [facility] labs UA IV Stadol x 1 for pain.   Nigel Bridgeman CNM, [place] 04/27/2013 11:43 AM  Addendum: Feeling much better after IV hydration and Stadol--slept soundly. FHR Category 1 UCs irregular, mild  Results for orders placed during the hospital encounter of 04/27/13 (from the  past 24 hour(s))  URINALYSIS, ROUTINE W REFLEX MICROSCOPIC     Status: Abnormal   Collection Time    04/27/13 10:00 AM      Result Value Range   Color, Urine YELLOW  YELLOW   APPearance CLEAR  CLEAR   Specific Gravity, Urine 1.015  1.005 - 1.030   pH 7.0  5.0 - 8.0   Glucose, UA NEGATIVE  NEGATIVE mg/dL   Hgb urine dipstick NEGATIVE  NEGATIVE   Bilirubin Urine NEGATIVE  NEGATIVE   Ketones, ur NEGATIVE  NEGATIVE mg/dL   Protein, ur NEGATIVE  NEGATIVE mg/dL   Urobilinogen, UA 0.2  0.0 - 1.0 mg/dL   Nitrite NEGATIVE  NEGATIVE   Leukocytes, UA SMALL (*) NEGATIVE  URINE MICROSCOPIC-ADD ON     Status: None   Collection Time    04/27/13 10:00 AM      Result Value Range   Squamous Epithelial / LPF RARE  RARE   WBC, UA 7-10  <3 WBC/hpf   Bacteria, UA RARE  RARE  AMNISURE RUPTURE OF MEMBRANE (ROM)     Status: None   Collection Time    04/27/13 11:40 AM      Result Value Range   Amnisure ROM NEGATIVE    CBC WITH DIFFERENTIAL     Status: Abnormal   Collection Time    04/27/13 11:51 AM      Result Value Range   WBC 10.8 (*) 4.0 - 10.5 K/uL   RBC 3.80 (*) 3.87 - 5.11 MIL/uL   Hemoglobin 9.6 (*) 12.0 - 15.0 g/dL   HCT 96.0 (*) 45.4 - 09.8 %   MCV 78.9  78.0 - 100.0 fL   MCH 25.3 (*) 26.0 - 34.0 pg   MCHC 32.0  30.0 - 36.0 g/dL   RDW 11.9  14.7 - 82.9 %   Platelets 229  150 - 400 K/uL   Neutrophils Relative % 73  43 - 77 %   Neutro Abs 7.9 (*) 1.7 - 7.7 K/uL   Lymphocytes Relative 20  12 - 46 %   Lymphs Abs 2.1  0.7 - 4.0 K/uL   Monocytes Relative 7  3 - 12 %   Monocytes Absolute 0.8  0.1 - 1.0 K/uL   Eosinophils Relative 1  0 - 5 %   Eosinophils Absolute 0.1  0.0 - 0.7 K/uL   Basophils Relative 0  0 - 1 %   Basophils Absolute 0.0  0.0 - 0.1 K/uL  COMPREHENSIVE METABOLIC PANEL     Status: Abnormal   Collection Time    04/27/13 11:51 AM      Result  Value Range   Sodium 135  135 - 145 mEq/L   Potassium 3.3 (*) 3.5 - 5.1 mEq/L   Chloride 103  96 - 112 mEq/L   CO2 21  19 - 32  mEq/L   Glucose, Bld 91  70 - 99 mg/dL   BUN 3 (*) 6 - 23 mg/dL   Creatinine, Ser 9.81 (*) 0.50 - 1.10 mg/dL   Calcium 8.5  8.4 - 19.1 mg/dL   Total Protein 6.7  6.0 - 8.3 g/dL   Albumin 2.4 (*) 3.5 - 5.2 g/dL   AST 11  0 - 37 U/L   ALT 6  0 - 35 U/L   Alkaline Phosphatase 168 (*) 39 - 117 U/L   Total Bilirubin 0.3  0.3 - 1.2 mg/dL   GFR calc non Af Amer >90  >90 mL/min   GFR calc Af Amer >90  >90 mL/min  LACTATE DEHYDROGENASE     Status: None   Collection Time    04/27/13 11:51 AM      Result Value Range   LDH 239  94 - 250 U/L  URIC ACID     Status: None   Collection Time    04/27/13 11:51 AM      Result Value Range   Uric Acid, Serum 3.5  2.4 - 7.0 mg/dL   EKG:  "NSR, unable to R/O anterior infarct, abnormal ECG" CXR:  Negative  Consulted with Dr. Su Hilt.  She recommended cardiology consult regarding EKG results. I called [facility] as on-call cardiologist--spoke with Dr. Patty Sermons. Patient had previous EKG in 2012 at [facility]. He reviewed the EKG and patient presentation/status--feels EKG is WNL and no cardiac f/u is needed for this patient, unless further issues present.  Dx: IUP at 38 4/7 weeks Likely musculoskeletal chest pain Mild hypokalemia  Plan: D/C home Rx Vicodin--written Rx given to patient Rx Kdur 20 meq BID x 3 days--sent to pharmacy Membranes swept per patient request (cervix still loose 3 cm, 75%, vtx, -1) Keep scheduled appt at [facility] on Monday or call prn. Refer to cardiologist if issues persist.  Nigel Bridgeman, CNM, MN 04/27/13 3:10p

## 2013-04-27 NOTE — MAU Note (Signed)
Pt reports a sharp pain in her chest when she lays down. Reports constant pelvic pressure as well. Fetal movement a little less than usual. Pt reports some clear fluid leaking out as well.

## 2013-04-28 ENCOUNTER — Inpatient Hospital Stay (HOSPITAL_COMMUNITY)
Admission: AD | Admit: 2013-04-28 | Discharge: 2013-05-01 | DRG: 775 | Disposition: A | Discharge status: Home / Self Care | Payer: Medicaid Other | Source: Ambulatory Visit | Attending: Obstetrics and Gynecology | Admitting: Obstetrics and Gynecology

## 2013-04-28 ENCOUNTER — Encounter (HOSPITAL_COMMUNITY): Payer: Self-pay | Admitting: Obstetrics

## 2013-04-28 LAB — CBC
Hemoglobin: 9.4 g/dL — ABNORMAL LOW (ref 12.0–15.0)
Platelets: 231 10*3/uL (ref 150–400)
RBC: 3.76 MIL/uL — ABNORMAL LOW (ref 3.87–5.11)
WBC: 14.1 10*3/uL — ABNORMAL HIGH (ref 4.0–10.5)

## 2013-04-28 MED ORDER — CITRIC ACID-SODIUM CITRATE 334-500 MG/5ML PO SOLN: 30.0000 mL | ORAL | Status: DC | PRN

## 2013-04-28 MED ORDER — LACTATED RINGERS IV SOLN
500.0000 mL | Freq: Once | INTRAVENOUS | Status: AC
  Administered 2013-04-28: 500 mL via INTRAVENOUS

## 2013-04-28 MED ORDER — OXYCODONE-ACETAMINOPHEN 5-325 MG PO TABS
1.0000 | ORAL_TABLET | ORAL | Status: DC | PRN
  Administered 2013-04-29: 1 via ORAL
  Filled 2013-04-28: qty 1

## 2013-04-28 MED ORDER — ACETAMINOPHEN 325 MG PO TABS
650.0000 mg | ORAL_TABLET | ORAL | Status: DC | PRN
Start: 2013-04-28 — End: 2013-04-29
  Administered 2013-04-29: 650 mg via ORAL
  Filled 2013-04-28: qty 1

## 2013-04-28 MED ORDER — OXYTOCIN BOLUS FROM INFUSION
500.0000 mL | INTRAVENOUS | Status: DC
  Administered 2013-04-29: 500 mL via INTRAVENOUS

## 2013-04-28 MED ORDER — PHENYLEPHRINE 40 MCG/ML (10ML) SYRINGE FOR IV PUSH (FOR BLOOD PRESSURE SUPPORT)
80.0000 ug | PREFILLED_SYRINGE | INTRAVENOUS | Status: DC | PRN
  Filled 2013-04-28: qty 2
  Filled 2013-04-28: qty 5

## 2013-04-28 MED ORDER — DIPHENHYDRAMINE HCL 50 MG/ML IJ SOLN: 12.5000 mg | INTRAMUSCULAR | Status: DC | PRN

## 2013-04-28 MED ORDER — OXYTOCIN 40 UNITS IN LACTATED RINGERS INFUSION - SIMPLE MED
62.5000 mL/h | INTRAVENOUS | Status: DC
  Filled 2013-04-28: qty 1000

## 2013-04-28 MED ORDER — LACTATED RINGERS IV SOLN: 500.0000 mL | INTRAVENOUS | Status: DC | PRN

## 2013-04-28 MED ORDER — LACTATED RINGERS IV SOLN
INTRAVENOUS | Status: DC
  Administered 2013-04-28 – 2013-04-29 (×2): via INTRAVENOUS

## 2013-04-28 MED ORDER — FENTANYL 2.5 MCG/ML BUPIVACAINE 1/10 % EPIDURAL INFUSION (WH - ANES)
14.0000 mL/h | INTRAMUSCULAR | Status: DC | PRN
  Administered 2013-04-29: 14 mL/h via EPIDURAL
  Filled 2013-04-28: qty 125

## 2013-04-28 MED ORDER — EPHEDRINE 5 MG/ML INJ
10.0000 mg | INTRAVENOUS | Status: DC | PRN
  Filled 2013-04-28: qty 2
  Filled 2013-04-28: qty 4

## 2013-04-28 MED ORDER — IBUPROFEN 600 MG PO TABS: 600.0000 mg | ORAL_TABLET | Freq: Four times a day (QID) | ORAL | Status: DC | PRN

## 2013-04-28 MED ORDER — PHENYLEPHRINE 40 MCG/ML (10ML) SYRINGE FOR IV PUSH (FOR BLOOD PRESSURE SUPPORT)
80.0000 ug | PREFILLED_SYRINGE | INTRAVENOUS | Status: DC | PRN
  Filled 2013-04-28: qty 2

## 2013-04-28 MED ORDER — EPHEDRINE 5 MG/ML INJ
10.0000 mg | INTRAVENOUS | Status: DC | PRN
  Filled 2013-04-28: qty 2

## 2013-04-28 MED ORDER — FLEET ENEMA 7-19 GM/118ML RE ENEM: 1.0000 | ENEMA | RECTAL | Status: DC | PRN

## 2013-04-28 MED ORDER — LIDOCAINE HCL (PF) 1 % IJ SOLN
30.0000 mL | INTRAMUSCULAR | Status: DC | PRN
  Administered 2013-04-29: 30 mL via SUBCUTANEOUS
  Filled 2013-04-28 (×2): qty 30

## 2013-04-28 MED ORDER — FENTANYL CITRATE 0.05 MG/ML IJ SOLN
100.0000 ug | INTRAMUSCULAR | Status: DC | PRN
  Administered 2013-04-28: 100 ug via INTRAVENOUS
  Filled 2013-04-28: qty 2

## 2013-04-28 MED ORDER — ONDANSETRON HCL 4 MG/2ML IJ SOLN: 4.0000 mg | Freq: Four times a day (QID) | INTRAMUSCULAR | Status: DC | PRN

## 2013-04-29 ENCOUNTER — Encounter (HOSPITAL_COMMUNITY): Payer: Self-pay | Admitting: Anesthesiology

## 2013-04-29 ENCOUNTER — Inpatient Hospital Stay (HOSPITAL_COMMUNITY): Payer: Medicaid Other | Admitting: Anesthesiology

## 2013-04-29 LAB — TYPE AND SCREEN

## 2013-04-29 LAB — ABO/RH: ABO/RH(D): A POS

## 2013-04-29 LAB — RPR: RPR Ser Ql: NONREACTIVE

## 2013-04-29 MED ORDER — DIBUCAINE 1 % RE OINT: 1.0000 "application " | TOPICAL_OINTMENT | RECTAL | Status: DC | PRN

## 2013-04-29 MED ORDER — ONDANSETRON HCL 4 MG PO TABS: 4.0000 mg | ORAL_TABLET | ORAL | Status: DC | PRN

## 2013-04-29 MED ORDER — LANOLIN HYDROUS EX OINT: TOPICAL_OINTMENT | CUTANEOUS | Status: DC | PRN

## 2013-04-29 MED ORDER — BISACODYL 10 MG RE SUPP: 10.0000 mg | Freq: Every day | RECTAL | Status: DC | PRN

## 2013-04-29 MED ORDER — PHENOL 1.4 % MT LIQD
1.0000 | OROMUCOSAL | Status: DC | PRN
  Filled 2013-04-29: qty 177

## 2013-04-29 MED ORDER — IBUPROFEN 600 MG PO TABS
600.0000 mg | ORAL_TABLET | Freq: Four times a day (QID) | ORAL | Status: DC
  Administered 2013-04-29 – 2013-05-01 (×8): 600 mg via ORAL
  Filled 2013-04-29 (×10): qty 1

## 2013-04-29 MED ORDER — BENZOCAINE-MENTHOL 20-0.5 % EX AERO
1.0000 "application " | INHALATION_SPRAY | CUTANEOUS | Status: DC | PRN
  Administered 2013-04-29 – 2013-05-01 (×2): 1 via TOPICAL
  Filled 2013-04-29 (×2): qty 56

## 2013-04-29 MED ORDER — DIPHENHYDRAMINE HCL 25 MG PO CAPS: 25.0000 mg | ORAL_CAPSULE | Freq: Four times a day (QID) | ORAL | Status: DC | PRN

## 2013-04-29 MED ORDER — OXYTOCIN 40 UNITS IN LACTATED RINGERS INFUSION - SIMPLE MED
1.0000 m[IU]/min | INTRAVENOUS | Status: DC
  Administered 2013-04-29: 1 m[IU]/min via INTRAVENOUS

## 2013-04-29 MED ORDER — PRENATAL MULTIVITAMIN CH
1.0000 | ORAL_TABLET | Freq: Every day | ORAL | Status: DC
  Administered 2013-04-29 – 2013-05-01 (×3): 1 via ORAL
  Filled 2013-04-29 (×3): qty 1

## 2013-04-29 MED ORDER — FLEET ENEMA 7-19 GM/118ML RE ENEM: 1.0000 | ENEMA | Freq: Every day | RECTAL | Status: DC | PRN

## 2013-04-29 MED ORDER — PSEUDOEPHEDRINE HCL 30 MG PO TABS
60.0000 mg | ORAL_TABLET | Freq: Four times a day (QID) | ORAL | Status: DC | PRN
  Administered 2013-04-29 – 2013-04-30 (×2): 60 mg via ORAL
  Filled 2013-04-29: qty 2
  Filled 2013-04-29 (×2): qty 1

## 2013-04-29 MED ORDER — LORATADINE 10 MG PO TABS
10.0000 mg | ORAL_TABLET | Freq: Every day | ORAL | Status: DC
  Administered 2013-04-29 – 2013-05-01 (×3): 10 mg via ORAL
  Filled 2013-04-29 (×3): qty 1

## 2013-04-29 MED ORDER — SENNOSIDES-DOCUSATE SODIUM 8.6-50 MG PO TABS
2.0000 | ORAL_TABLET | Freq: Every day | ORAL | Status: DC
  Administered 2013-04-29 – 2013-04-30 (×2): 2 via ORAL

## 2013-04-29 MED ORDER — TETANUS-DIPHTH-ACELL PERTUSSIS 5-2.5-18.5 LF-MCG/0.5 IM SUSP
0.5000 mL | Freq: Once | INTRAMUSCULAR | Status: AC
  Administered 2013-04-30: 0.5 mL via INTRAMUSCULAR

## 2013-04-29 MED ORDER — ZOLPIDEM TARTRATE 5 MG PO TABS: 5.0000 mg | ORAL_TABLET | Freq: Every evening | ORAL | Status: DC | PRN

## 2013-04-29 MED ORDER — FENTANYL 2.5 MCG/ML BUPIVACAINE 1/10 % EPIDURAL INFUSION (WH - ANES)
INTRAMUSCULAR | Status: DC | PRN
  Administered 2013-04-29: 14 mL/h via EPIDURAL

## 2013-04-29 MED ORDER — OXYCODONE-ACETAMINOPHEN 5-325 MG PO TABS
1.0000 | ORAL_TABLET | ORAL | Status: DC | PRN
  Administered 2013-04-29 – 2013-04-30 (×5): 1 via ORAL
  Administered 2013-04-30: 2 via ORAL
  Administered 2013-05-01 (×2): 1 via ORAL
  Filled 2013-04-29 (×5): qty 1
  Filled 2013-04-29: qty 2
  Filled 2013-04-29 (×2): qty 1

## 2013-04-29 MED ORDER — PNEUMOCOCCAL VAC POLYVALENT 25 MCG/0.5ML IJ INJ
0.5000 mL | INJECTION | INTRAMUSCULAR | Status: AC
  Administered 2013-04-30: 0.5 mL via INTRAMUSCULAR
  Filled 2013-04-29: qty 0.5

## 2013-04-29 MED ORDER — LIDOCAINE HCL (PF) 1 % IJ SOLN
INTRAMUSCULAR | Status: DC | PRN
  Administered 2013-04-29 (×2): 8 mL

## 2013-04-29 MED ORDER — SODIUM BICARBONATE 8.4 % IV SOLN
INTRAVENOUS | Status: DC | PRN
  Administered 2013-04-29: 5 mL via EPIDURAL

## 2013-04-29 MED ORDER — MENTHOL 3 MG MT LOZG: 1.0000 | LOZENGE | OROMUCOSAL | Status: DC | PRN

## 2013-04-29 MED ORDER — WITCH HAZEL-GLYCERIN EX PADS: 1.0000 "application " | MEDICATED_PAD | CUTANEOUS | Status: DC | PRN

## 2013-04-29 MED ORDER — SIMETHICONE 80 MG PO CHEW: 80.0000 mg | CHEWABLE_TABLET | ORAL | Status: DC | PRN

## 2013-04-29 MED ORDER — MEDROXYPROGESTERONE ACETATE 150 MG/ML IM SUSP: 150.0000 mg | INTRAMUSCULAR | Status: DC | PRN

## 2013-04-29 MED ORDER — ONDANSETRON HCL 4 MG/2ML IJ SOLN: 4.0000 mg | INTRAMUSCULAR | Status: DC | PRN

## 2013-04-29 MED ORDER — MEASLES, MUMPS & RUBELLA VAC ~~LOC~~ INJ
0.5000 mL | INJECTION | Freq: Once | SUBCUTANEOUS | Status: AC
  Administered 2013-05-01: 0.5 mL via SUBCUTANEOUS
  Filled 2013-04-29 (×3): qty 0.5

## 2013-04-29 NOTE — Anesthesia Procedure Notes (Addendum)
Epidural Patient location during procedure: OB Start time: 04/29/2013 12:31 AM End time: 04/29/2013 12:35 AM  Staffing Anesthesiologist: Sandrea Hughs Performed by: anesthesiologist   Preanesthetic Checklist Completed: patient identified, site marked, surgical consent, pre-op evaluation, timeout performed, IV checked, risks and benefits discussed and monitors and equipment checked  Epidural Patient position: sitting Prep: site prepped and draped and DuraPrep Patient monitoring: continuous pulse ox and blood pressure Approach: midline Injection technique: LOR air  Needle:  Needle type: Tuohy  Needle gauge: 17 G Needle length: 9 cm and 9 Needle insertion depth: 5 cm cm Catheter type: closed end flexible Catheter size: 19 Gauge Catheter at skin depth: 10 cm Test dose: negative and Other  Assessment Sensory level: T9 Events: blood not aspirated, injection not painful, no injection resistance, negative IV test and no paresthesia  Additional Notes Reason for block:procedure for pain

## 2013-04-29 NOTE — Anesthesia Preprocedure Evaluation (Signed)
Anesthesia Evaluation  Patient identified by MRN, date of birth, ID band Patient awake    Reviewed: Allergy & Precautions, H&P , NPO status , Patient's Chart, lab work & pertinent test results  Airway Mallampati: II TM Distance: >3 FB Neck ROM: full    Dental no notable dental hx.    Pulmonary    Pulmonary exam normal       Cardiovascular     Neuro/Psych negative psych ROS   GI/Hepatic negative GI ROS, Neg liver ROS,   Endo/Other    Renal/GU negative Renal ROS  negative genitourinary   Musculoskeletal   Abdominal Normal abdominal exam  (+)   Peds negative pediatric ROS (+)  Hematology negative hematology ROS (+)   Anesthesia Other Findings   Reproductive/Obstetrics (+) Pregnancy                           Anesthesia Physical Anesthesia Plan  ASA: II  Anesthesia Plan: Epidural   Post-op Pain Management:    Induction:   Airway Management Planned:   Additional Equipment:   Intra-op Plan:   Post-operative Plan:   Informed Consent: I have reviewed the patients History and Physical, chart, labs and discussed the procedure including the risks, benefits and alternatives for the proposed anesthesia with the patient or authorized representative who has indicated his/her understanding and acceptance.     Plan Discussed with:   Anesthesia Plan Comments:         Anesthesia Quick Evaluation

## 2013-04-29 NOTE — Progress Notes (Signed)
Patient ID: Brenda Spencer, female   DOB: [birth year], 22 y.o.   MRN: [id number] Brenda Spencer is a 22 y.o. [phone] at [redacted]w[redacted]d admitted for labor  Subjective: Comfortable w epidural now  Objective: BP 121/83  Pulse 101  Temp(Src) 97.8 F (36.6 C) (Axillary)  Resp 16  Ht 5\' 1"  (1.549 m)  Wt 179 lb (81.194 kg)  BMI 33.84 kg/m2  SpO2 93%  LMP 07/31/2012     FHT:  FHR: 130 bpm, variability: moderate,  accelerations:  Present,  decelerations:  Present 1 variable, while pt on her back for exam  UC:   irregular, every 3-5 minutes SVE:   Dilation: 7 Effacement (%): 90 Station: -2 Exam by:: S Tanika Bracco  AROM - scant amt clear fluid, membranes tight against vertex IUPC placed without difficulty    Assessment / Plan: Spontaneous labor, progressing normally Ctx do not palpate, not tracing on toco   Labor: inadequate MVU's after AROM, will augment w pitocin Preeclampsia:  no signs or symptoms of toxicity Fetal Wellbeing:  Category I Pain Control:  Epidural Anticipated MOD:  NSVD  Recheck 2-3hrs or prn   Update physician PRN   Mariaeduarda Defranco M 04/29/2013, 2:29 AM

## 2013-04-29 NOTE — H&P (Signed)
Brenda Spencer is a 22 y.o. female presenting for labor eval. Pt denies any VB, ? LOF, GFM. Requesting epidural   HPI: Pt began [facility] in [place], (those records are unavailable at the time of this admission), she tx [facility] at 23wks.  She has had back pain, and seen by PT She has had asthma exacerbation, seen by Pulmonology Routine care, otherwise Seen in MAU on 5/15 for evaluation of chest pain, EKG rv'd by cardiology and no further f/u recommended, PIH labs were normal    Maternal Medical History:  Reason for admission: Contractions.  Nausea.  Contractions: Onset was 3-5 hours ago.   Frequency: regular.   Duration is approximately 60 seconds.   Perceived severity is moderate.    Fetal activity: Perceived fetal activity is normal.   Last perceived fetal movement was within the past hour.    Prenatal complications: Cholelithiasis.     OB History   Grav Para Term Preterm Abortions TAB SAB Ect Mult Living   3 1  1 1  1   1      Obstetric Comments   2011 PIH;IOL; PP CHF-HOSPITALIZED X 1 WEEK     Past Medical History  Diagnosis Date  . Eczema   . CHF (congestive heart failure)     RESOLVED; HAD CARDIAC EVAL   . Congestive heart failure 2011    related to preeclampsia  . Hyperthyroidism AGE 65    NO TX  . Hypothyroidism AGE 65     NO TX  . Pregnancy induced hypertension 2011  . Preterm labor 2011  . Infection     UTI OCC  . Infection 2013    TRICH  . Headache 2011    MIGRAQINES; OTC  . Asthma 2009; CORNERSTONE    INHALER PRN   Past Surgical History  Procedure Laterality Date  . Tonsillectomy    . Wisdom tooth extraction    . Tear duct probing    . Cholecystectomy     Family History: family history includes Asthma in her brother and daughter; Colon cancer in her maternal grandmother; Learning disabilities in her brother, daughter, and other; and Seizures in her daughter.  There is no history of Anesthesia problems and Other. Social History:  reports  that she has never smoked. She has never used smokeless tobacco. She reports that she does not drink alcohol or use illicit drugs.   Prenatal Transfer Tool  Maternal Diabetes: No Genetic Screening: unknown at time of admission  Maternal Ultrasounds/Referrals: Normal Fetal Ultrasounds or other Referrals:  None Maternal Substance Abuse:  No Significant Maternal Medications:  None Significant Maternal Lab Results:  Lab values include: Group B Strep negative Other Comments:  None  Review of Systems  Constitutional: Negative for fever.  HENT: Positive for congestion and sore throat.   Eyes: Negative for blurred vision.  Respiratory: Positive for cough and sputum production. Negative for shortness of breath and wheezing.   Cardiovascular: Negative for chest pain, palpitations and leg swelling.  Gastrointestinal: Negative for heartburn, nausea and vomiting.  Neurological: Negative for headaches.  Psychiatric/Behavioral: The patient is nervous/anxious.   All other systems reviewed and are negative.    Dilation: 7 Effacement (%): 90 Station: -2 Exam by:: S Romulo Okray Blood pressure 121/83, pulse 101, temperature 97.8 F (36.6 C), temperature source Axillary, resp. rate 16, height 5\' 1"  (1.549 m), weight 179 lb (81.194 kg), last menstrual period 07/31/2012, SpO2 93.00%. Maternal Exam:  Uterine Assessment: Contraction strength is mild.  Contraction duration  is 40 seconds. Contraction frequency is regular.   Abdomen: Patient reports no abdominal tenderness. Fundal height is aga.   Estimated fetal weight is 7#.   Fetal presentation: vertex  Introitus: Normal vulva. Normal vagina.  Ferning test: not done.   Pelvis: adequate for delivery.   Cervix: Cervix evaluated by digital exam.     Fetal Exam Fetal Monitor Review: Mode: ultrasound.   Baseline rate: 130.  Variability: moderate (6-25 bpm).   Pattern: accelerations present and no decelerations.    Fetal State Assessment: Category I  - tracings are normal.     Physical Exam  Nursing note and vitals reviewed. Constitutional: She is oriented to person, place, and time. She appears well-developed and well-nourished.  HENT:  Head: Normocephalic.  Eyes: Pupils are equal, round, and reactive to light.  Neck: Normal range of motion.  Cardiovascular: Normal rate, regular rhythm and normal heart sounds.   Respiratory: Effort normal and breath sounds normal.  GI: Soft. Bowel sounds are normal.  Genitourinary: Vagina normal.  Musculoskeletal: Normal range of motion.  Neurological: She is alert and oriented to person, place, and time. She has normal reflexes.  Skin: Skin is warm and dry.  Psychiatric: She has a normal mood and affect. Her behavior is normal.    Prenatal labs: ABO, Rh:  Apos  Antibody:   Rubella:   RPR: NON REAC (02/24 1213)  HBsAg:    HIV:    GBS: Negative (01/18 0000) neg on 4/24 GC/CT neg 4/24 Pap WNL 1/27 hgb 10.6, RPR NR, 1hr gtt 97 on 2/24  Unable to access HIV, hepB, Rubella and AB screen at time of admission, will add on to routine labs   Assessment/Plan: IUP at [redacted]w[redacted]d Early/active labor GBS neg FHR reassuring  Admit to b.s per c/w Dr Estanislado Pandy Routine L&D orders  Epidural ASAP Expectant mgmnt at present   Harrington Jobe M 04/29/2013, 2:32 AM

## 2013-04-30 LAB — CBC
HCT: 27.9 % — ABNORMAL LOW (ref 36.0–46.0)
Hemoglobin: 8.8 g/dL — ABNORMAL LOW (ref 12.0–15.0)
MCH: 25 pg — ABNORMAL LOW (ref 26.0–34.0)
MCHC: 31.5 g/dL (ref 30.0–36.0)

## 2013-04-30 MED ORDER — GUAIFENESIN ER 600 MG PO TB12
600.0000 mg | ORAL_TABLET | Freq: Two times a day (BID) | ORAL | Status: DC | PRN
  Administered 2013-04-30 – 2013-05-01 (×2): 600 mg via ORAL
  Filled 2013-04-30 (×2): qty 1

## 2013-04-30 NOTE — Progress Notes (Signed)
Post Partum Day 1: S/P SVD with periurethral lac  Subjective: Patient up ad lib, denies syncope or dizziness. C/o cough not releived by current medications orderd   Feeding:  breast Contraceptive plan:   unknown  Objective: Blood pressure 117/81, pulse 90, temperature 97.9 F (36.6 C), temperature source Oral, resp. rate 17, height 5\' 1"  (1.549 m), weight 179 lb (81.194 kg), last menstrual period 07/31/2012, SpO2 98.00%, unknown if currently breastfeeding.  Physical Exam:  General: alert, cooperative and no distress Lochia: appropriate Uterine Fundus: firm Incision: healing well DVT Evaluation: No evidence of DVT seen on physical exam. Negative Homan's sign.   Recent Labs  04/28/13 2300 04/30/13 0604  HGB 9.4* 8.8*  HCT 29.5* 27.9*    Assessment/Plan: S/P Vaginal delivery day 1 Continue current care Rx Musinex 600mg  BID PRN Plan for discharge tomorrow   LOS: 2 days   Brenda Spencer 04/30/2013, 2:04 PM

## 2013-04-30 NOTE — Anesthesia Postprocedure Evaluation (Signed)
Anesthesia Post Note  Patient: Brenda Spencer  Procedure(s) Performed: * No procedures listed *  Anesthesia type: Epidural  Patient location: Mother/Baby  Post pain: Pain level controlled  Post assessment: Post-op Vital signs reviewed  Last Vitals:  Filed Vitals:   04/30/13 0625  BP: 117/81  Pulse: 90  Temp: 36.6 C  Resp: 17    Post vital signs: Reviewed  Level of consciousness:alert  Complications: No apparent anesthesia complications

## 2013-05-01 MED ORDER — PSEUDOEPHEDRINE-GUAIFENESIN ER 120-1200 MG PO TB12: 1.0000 | ORAL_TABLET | Freq: Two times a day (BID) | ORAL | Status: DC | PRN

## 2013-05-01 MED ORDER — IBUPROFEN 600 MG PO TABS: 600.0000 mg | ORAL_TABLET | Freq: Four times a day (QID) | ORAL | Status: DC | PRN

## 2013-05-01 MED ORDER — FERRALET 90 90-1 MG PO TABS: 1.0000 | ORAL_TABLET | Freq: Every day | ORAL | Status: DC

## 2013-05-01 NOTE — Progress Notes (Signed)
Ur chart review completed.  

## 2013-05-06 NOTE — Addendum Note (Signed)
Addendum created 05/06/13 1805 by Sandrea Hughs., MD   Modules edited: Anesthesia Events

## 2013-06-01 NOTE — Discharge Summary (Signed)
Obstetric Discharge Summary Reason for Admission: onset of labor Prenatal Procedures: ultrasound Intrapartum Procedures: spontaneous vaginal delivery and epidural Postpartum Procedures: none Complications-Operative and Postpartum: periurethral laceration-repaired    Hemoglobin  Date Value Range Status  04/30/2013 8.8* 12.0 - 15.0 g/dL Final  16/09/9603 54.0   Final     HCT  Date Value Range Status  04/30/2013 27.9* 36.0 - 46.0 % Final  09/28/2012 37   Final    Hospital Course:  Hospital Course: Admitted in labor at [redacted]w[redacted]d, on 04/28/13; cx5/90/-2. Negative GBS. Received epidural shortly after admission. AROM at 0145, and IUPC inserted. Pitocin followed secondary to inadequate MVU's. Progressed to fully dilated at 0443. Delivery was performed by Sanda Klein, CNM without difficulty, at [phone], 04/29/13. Patient and baby tolerated the procedure without difficulty, with a peri-urethral laceration noted. Infant to FTN. Mother and infant then had an uncomplicated postpartum course, with breast/bottle feeding going well. Mom's physical exam was WNL, and she was discharged home in stable condition. Contraception plan was undecided.  She received adequate benefit from po pain medications.  Discharge Diagnoses: Term Pregnancy-delivered and persisting anemia; asthma; congestion/UR s/s  Discharge Information: Date: 05/01/13 Activity: pelvic rest Diet: iron rich Medications:    Medication List    STOP taking these medications       potassium chloride 10 MEQ tablet  Commonly known as:  K-DUR      TAKE these medications       albuterol 108 (90 BASE) MCG/ACT inhaler  Commonly known as:  PROVENTIL HFA;VENTOLIN HFA  Inhale 2 puffs into the lungs every 6 (six) hours as needed for wheezing.     budesonide 180 MCG/ACT inhaler  Commonly known as:  PULMICORT FLEXHALER  Inhale 2 puffs into the lungs 2 (two) times daily.     FERRALET 90 90-1 MG Tabs  Take 1 tablet by mouth daily.     HYDROcodone-acetaminophen 5-325 MG per tablet  Commonly known as:  NORCO/VICODIN  Take 1 tablet by mouth every 6 (six) hours as needed for pain.     ibuprofen 600 MG tablet  Commonly known as:  ADVIL,MOTRIN  Take 1 tablet (600 mg total) by mouth every 6 (six) hours as needed for pain.     prenatal multivitamin Tabs  Take 1 tablet by mouth daily.     Pseudoephedrine-Guaifenesin [phone] MG Tb12  Take 1 capsule by mouth every 12 (twelve) hours as needed.       Condition: stable Instructions: refer to practice specific booklet and PP blues, caring for newborn/infant; anemia FAQs w/ iron-rich diet; contraceptive options Discharge to: home     Follow-up Information   Follow up with [facility]. Schedule an appointment as soon as possible for a visit in 5 weeks. (or call as needed with any questions or concerns)    Contact information:   [address] [place] [address] [phone]      Newborn Data: Live born  Information for the patient's newborn:  Roshanna, Cimino [[id number]]  female ; APGAR 8,9 ; weight  6lb 8.4oz;  Home with mother.  Brenda Spencer H 06/01/2013, 10:42 AM

## 2013-06-06 ENCOUNTER — Encounter (HOSPITAL_BASED_OUTPATIENT_CLINIC_OR_DEPARTMENT_OTHER): Payer: Self-pay | Admitting: *Deleted

## 2013-06-06 ENCOUNTER — Emergency Department (HOSPITAL_BASED_OUTPATIENT_CLINIC_OR_DEPARTMENT_OTHER)
Admission: EM | Admit: 2013-06-06 | Discharge: 2013-06-06 | Disposition: A | Discharge status: Home / Self Care | Payer: Medicaid Other | Attending: Emergency Medicine | Admitting: Emergency Medicine

## 2013-06-06 ENCOUNTER — Ambulatory Visit: Payer: Medicaid Other | Admitting: Pulmonary Disease

## 2013-06-06 DIAGNOSIS — M545 Low back pain, unspecified: Secondary | ICD-10-CM | POA: Insufficient documentation

## 2013-06-06 DIAGNOSIS — H00026 Hordeolum internum left eye, unspecified eyelid: Secondary | ICD-10-CM

## 2013-06-06 DIAGNOSIS — Z8751 Personal history of pre-term labor: Secondary | ICD-10-CM | POA: Insufficient documentation

## 2013-06-06 DIAGNOSIS — Z79899 Other long term (current) drug therapy: Secondary | ICD-10-CM | POA: Insufficient documentation

## 2013-06-06 DIAGNOSIS — M5380 Other specified dorsopathies, site unspecified: Secondary | ICD-10-CM | POA: Insufficient documentation

## 2013-06-06 DIAGNOSIS — J45909 Unspecified asthma, uncomplicated: Secondary | ICD-10-CM | POA: Insufficient documentation

## 2013-06-06 DIAGNOSIS — I509 Heart failure, unspecified: Secondary | ICD-10-CM | POA: Insufficient documentation

## 2013-06-06 DIAGNOSIS — Z872 Personal history of diseases of the skin and subcutaneous tissue: Secondary | ICD-10-CM | POA: Insufficient documentation

## 2013-06-06 DIAGNOSIS — Z87440 Personal history of urinary (tract) infections: Secondary | ICD-10-CM | POA: Insufficient documentation

## 2013-06-06 DIAGNOSIS — M62830 Muscle spasm of back: Secondary | ICD-10-CM

## 2013-06-06 DIAGNOSIS — H00029 Hordeolum internum unspecified eye, unspecified eyelid: Secondary | ICD-10-CM | POA: Insufficient documentation

## 2013-06-06 DIAGNOSIS — Z8669 Personal history of other diseases of the nervous system and sense organs: Secondary | ICD-10-CM | POA: Insufficient documentation

## 2013-06-06 DIAGNOSIS — Z8639 Personal history of other endocrine, nutritional and metabolic disease: Secondary | ICD-10-CM | POA: Insufficient documentation

## 2013-06-06 DIAGNOSIS — Z8619 Personal history of other infectious and parasitic diseases: Secondary | ICD-10-CM | POA: Insufficient documentation

## 2013-06-06 DIAGNOSIS — Z9889 Other specified postprocedural states: Secondary | ICD-10-CM | POA: Insufficient documentation

## 2013-06-06 DIAGNOSIS — Z862 Personal history of diseases of the blood and blood-forming organs and certain disorders involving the immune mechanism: Secondary | ICD-10-CM | POA: Insufficient documentation

## 2013-06-06 LAB — URINALYSIS, ROUTINE W REFLEX MICROSCOPIC
Bilirubin Urine: NEGATIVE
Glucose, UA: NEGATIVE mg/dL
Ketones, ur: NEGATIVE mg/dL
Protein, ur: NEGATIVE mg/dL

## 2013-06-06 LAB — URINE MICROSCOPIC-ADD ON

## 2013-06-06 MED ORDER — TRAMADOL HCL 50 MG PO TABS: 50.0000 mg | ORAL_TABLET | Freq: Four times a day (QID) | ORAL | Status: DC | PRN

## 2013-06-06 MED ORDER — KETOROLAC TROMETHAMINE 60 MG/2ML IM SOLN
60.0000 mg | Freq: Once | INTRAMUSCULAR | Status: AC
  Administered 2013-06-06: 60 mg via INTRAMUSCULAR
  Filled 2013-06-06: qty 2

## 2013-06-06 MED ORDER — BACITRA-NEOMYCIN-POLYMYXIN-HC 1 % OP OINT: TOPICAL_OINTMENT | Freq: Two times a day (BID) | OPHTHALMIC | Status: DC

## 2013-06-06 MED ORDER — IBUPROFEN 600 MG PO TABS: 600.0000 mg | ORAL_TABLET | Freq: Four times a day (QID) | ORAL | Status: DC | PRN

## 2013-06-06 MED ORDER — METHOCARBAMOL 500 MG PO TABS: 500.0000 mg | ORAL_TABLET | Freq: Two times a day (BID) | ORAL | Status: DC

## 2013-06-06 MED ORDER — METHOCARBAMOL 500 MG PO TABS
500.0000 mg | ORAL_TABLET | Freq: Once | ORAL | Status: AC
  Administered 2013-06-06: 500 mg via ORAL
  Filled 2013-06-06: qty 1

## 2013-06-06 NOTE — ED Notes (Signed)
Left eye pain x 3-4 days- also c/o back pain since vaginal delivery 04/29/2013

## 2013-06-06 NOTE — ED Provider Notes (Signed)
History    CSN: [id number] Arrival date & time 06/06/13  1843  First MD Initiated Contact with Patient 06/06/13 1908     Chief Complaint  Patient presents with  . Eye Pain  . Back Pain   (Consider location/radiation/quality/duration/timing/severity/associated sxs/prior Treatment) HPI Pt present with 3-4 days of painful swelling to internal left lower and left upper eye lid. No d/c. No vision changes. No fever or chills.   Pt also c/o several months of episodic low back pain worse with movement. No weakness or numbness. No urinary incontinence, dysuria or flank pain.  Past Medical History  Diagnosis Date  . Eczema   . CHF (congestive heart failure)     RESOLVED; HAD CARDIAC EVAL   . Congestive heart failure 2011    related to preeclampsia  . Hyperthyroidism AGE 7    NO TX  . Hypothyroidism AGE 7     NO TX  . Pregnancy induced hypertension 2011  . Preterm labor 2011  . Infection     UTI OCC  . Infection 2013    TRICH  . Headache(784.0) 2011    MIGRAQINES; OTC  . Asthma 2009; [facility]STONE    INHALER PRN   Past Surgical History  Procedure Laterality Date  . Tonsillectomy    . Wisdom tooth extraction    . Tear duct probing    . Cholecystectomy     Family History  Problem Relation Age of Onset  . Anesthesia problems Neg Hx   . Other Neg Hx   . Asthma Brother   . Learning disabilities Brother   . Asthma Daughter   . Learning disabilities Daughter   . Seizures Daughter   . Learning disabilities Other   . Colon cancer Maternal Grandmother    History  Substance Use Topics  . Smoking status: Never Smoker   . Smokeless tobacco: Never Used  . Alcohol Use: No   OB History   Grav Para Term Preterm Abortions TAB SAB Ect Mult Living   3 2 1 1 1  1   2      Obstetric Comments   2011 PIH;IOL; PP CHF-HOSPITALIZED X 1 WEEK     Review of Systems  Constitutional: Negative for fever and chills.  HENT: Negative for congestion, sore throat and rhinorrhea.   Eyes:  Negative for pain, discharge and visual disturbance.  Gastrointestinal: Negative for nausea, vomiting and abdominal pain.  Genitourinary: Negative for dysuria, frequency, hematuria, flank pain and difficulty urinating.  Musculoskeletal: Positive for myalgias and back pain.  Skin: Negative for rash and wound.  Neurological: Negative for dizziness, weakness, light-headedness, numbness and headaches.  All other systems reviewed and are negative.    Allergies  Review of patient's allergies indicates no known allergies.  Home Medications   Current Outpatient Rx  Name  Route  Sig  Dispense  Refill  . albuterol (PROVENTIL HFA;VENTOLIN HFA) 108 (90 BASE) MCG/ACT inhaler   Inhalation   Inhale 2 puffs into the lungs every 6 (six) hours as needed for wheezing.   1 Inhaler   0   . bacitracin-neomycin-polymyxin-hydrocortisone (CORTISPORIN) 1 % ophthalmic ointment   Left Eye   Place into the left eye 2 (two) times daily. Until improvement of symptoms   3.5 g   0   . budesonide (PULMICORT FLEXHALER) 180 MCG/ACT inhaler   Inhalation   Inhale 2 puffs into the lungs 2 (two) times daily.   1 Inhaler   4   . Fe Cbn-Fe Gluc-FA-B12-C-DSS (FERRALET 90)  90-1 MG TABS   Oral   Take 1 tablet by mouth daily.   90 each   3   . HYDROcodone-acetaminophen (NORCO/VICODIN) 5-325 MG per tablet   Oral   Take 1 tablet by mouth every 6 (six) hours as needed for pain.   30 tablet   0   . ibuprofen (ADVIL,MOTRIN) 600 MG tablet   Oral   Take 1 tablet (600 mg total) by mouth every 6 (six) hours as needed for pain.   30 tablet   2   . ibuprofen (ADVIL,MOTRIN) 600 MG tablet   Oral   Take 1 tablet (600 mg total) by mouth every 6 (six) hours as needed for pain.   30 tablet   0   . methocarbamol (ROBAXIN) 500 MG tablet   Oral   Take 1 tablet (500 mg total) by mouth 2 (two) times daily.   20 tablet   0   . Prenatal Vit-Fe Fumarate-FA (PRENATAL MULTIVITAMIN) TABS   Oral   Take 1 tablet by mouth  daily.         . Pseudoephedrine-Guaifenesin [phone] MG TB12   Oral   Take 1 capsule by mouth every 12 (twelve) hours as needed.   20 each   1   . traMADol (ULTRAM) 50 MG tablet   Oral   Take 1 tablet (50 mg total) by mouth every 6 (six) hours as needed for pain.   15 tablet   0    BP 120/47  Pulse 90  Temp(Src) 98.4 F (36.9 C) (Oral)  Resp 16  Ht 5\' 1"  (1.549 m)  Wt 161 lb (73.029 kg)  BMI 30.44 kg/m2  SpO2 99%  Breastfeeding? No Physical Exam  Nursing note and vitals reviewed. Constitutional: She is oriented to person, place, and time. She appears well-developed and well-nourished. No distress.  HENT:  Head: Normocephalic and atraumatic.  Mouth/Throat: Oropharynx is clear and moist. No oropharyngeal exudate.  Eyes: Conjunctivae and EOM are normal. Pupils are equal, round, and reactive to light. Right eye exhibits no discharge. Left eye exhibits no discharge.  L lower internal eyelid with small, tender mass and another L lateral upper eyelid. Normal conjunctiva and lid margin.   Neck: Normal range of motion. Neck supple.  Cardiovascular: Normal rate and regular rhythm.   Pulmonary/Chest: Effort normal and breath sounds normal. No respiratory distress. She has no wheezes. She has no rales.  Abdominal: Soft. Bowel sounds are normal. She exhibits no distension and no mass. There is no tenderness. There is no rebound and no guarding.  Musculoskeletal: Normal range of motion. She exhibits tenderness (TTP over R lumbar paraspinal muscle with palpation. neg straight leg raise. No deformity. ). She exhibits no edema.  Neurological: She is alert and oriented to person, place, and time.  5/5 motor in all ext, sensation intact.   Skin: Skin is warm and dry. No rash noted. No erythema.  Psychiatric: She has a normal mood and affect. Her behavior is normal.    ED Course  Procedures (including critical care time) Labs Reviewed  URINALYSIS, ROUTINE W REFLEX MICROSCOPIC -  Abnormal; Notable for the following:    Hgb urine dipstick LARGE (*)    Leukocytes, UA MODERATE (*)    All other components within normal limits  URINE MICROSCOPIC-ADD ON - Abnormal; Notable for the following:    Bacteria, UA FEW (*)    All other components within normal limits  URINE CULTURE   No results found. 1. Internal hordeolum  of left eye   2. Lumbar paraspinal muscle spasm     MDM  Eyelid exam consistent with internal hordeolum and not chalazion. Encouraged warm compresses and topical abx. F/u with ophthalmology. No red flag concerns for back pain. Will treat symptomatically.   Loren Racer, MD 06/06/13 [phone]

## 2013-06-07 LAB — URINE CULTURE: Colony Count: NO GROWTH

## 2013-08-21 ENCOUNTER — Emergency Department (HOSPITAL_BASED_OUTPATIENT_CLINIC_OR_DEPARTMENT_OTHER)
Admission: EM | Admit: 2013-08-21 | Discharge: 2013-08-21 | Disposition: A | Discharge status: Home / Self Care | Payer: Medicaid Other | Attending: Emergency Medicine | Admitting: Emergency Medicine

## 2013-08-21 ENCOUNTER — Encounter (HOSPITAL_BASED_OUTPATIENT_CLINIC_OR_DEPARTMENT_OTHER): Payer: Self-pay | Admitting: Family Medicine

## 2013-08-21 DIAGNOSIS — Y929 Unspecified place or not applicable: Secondary | ICD-10-CM | POA: Insufficient documentation

## 2013-08-21 DIAGNOSIS — Z79899 Other long term (current) drug therapy: Secondary | ICD-10-CM | POA: Insufficient documentation

## 2013-08-21 DIAGNOSIS — I509 Heart failure, unspecified: Secondary | ICD-10-CM | POA: Insufficient documentation

## 2013-08-21 DIAGNOSIS — J45909 Unspecified asthma, uncomplicated: Secondary | ICD-10-CM | POA: Insufficient documentation

## 2013-08-21 DIAGNOSIS — X58XXXA Exposure to other specified factors, initial encounter: Secondary | ICD-10-CM | POA: Insufficient documentation

## 2013-08-21 DIAGNOSIS — S335XXA Sprain of ligaments of lumbar spine, initial encounter: Secondary | ICD-10-CM | POA: Insufficient documentation

## 2013-08-21 DIAGNOSIS — Z862 Personal history of diseases of the blood and blood-forming organs and certain disorders involving the immune mechanism: Secondary | ICD-10-CM | POA: Insufficient documentation

## 2013-08-21 DIAGNOSIS — Z872 Personal history of diseases of the skin and subcutaneous tissue: Secondary | ICD-10-CM | POA: Insufficient documentation

## 2013-08-21 DIAGNOSIS — S39012A Strain of muscle, fascia and tendon of lower back, initial encounter: Secondary | ICD-10-CM

## 2013-08-21 DIAGNOSIS — Z8639 Personal history of other endocrine, nutritional and metabolic disease: Secondary | ICD-10-CM | POA: Insufficient documentation

## 2013-08-21 DIAGNOSIS — Y939 Activity, unspecified: Secondary | ICD-10-CM | POA: Insufficient documentation

## 2013-08-21 DIAGNOSIS — Z87440 Personal history of urinary (tract) infections: Secondary | ICD-10-CM | POA: Insufficient documentation

## 2013-08-21 MED ORDER — METHOCARBAMOL 500 MG PO TABS: 500.0000 mg | ORAL_TABLET | Freq: Two times a day (BID) | ORAL | Status: DC

## 2013-08-21 MED ORDER — IBUPROFEN 800 MG PO TABS: 800.0000 mg | ORAL_TABLET | Freq: Three times a day (TID) | ORAL | Status: DC

## 2013-08-21 NOTE — ED Notes (Signed)
Pt c/o low back spasms since Thursday. Pt reports taking muscle relaxer and ibuprofen without relief. Pt sts she delivered baby on May 17 and is not breastfeeding.

## 2013-08-21 NOTE — ED Provider Notes (Signed)
CSN: [id number]     Arrival date & time 08/21/13  1235 History   First MD Initiated Contact with Patient 08/21/13 1310     Chief Complaint  Patient presents with  . Back Pain   (Consider location/radiation/quality/duration/timing/severity/associated sxs/prior Treatment) HPI Comments: Patient is otherwise healthy 22 year old female with a previous history of back pain who presents with pain to lumbar spine without radiation.  She denies fever, chills, radiation of the pain, incontinence of bowel or bladder, nausea, vomiting, numbness or tingling.  Patient is a 22 y.o. female presenting with back pain. The history is provided by the patient. No language interpreter was used.  Back Pain Location:  Lumbar spine Quality:  Aching and stabbing Radiates to:  Does not radiate Pain severity:  Severe Pain is:  Worse during the day Onset quality:  Gradual Duration:  3 days Timing:  Constant Progression:  Worsening Chronicity:  Recurrent Context: not emotional stress, not falling, not jumping from heights, not lifting heavy objects, not MCA, not MVA, not occupational injury, not pedestrian accident, not physical stress, not recent illness, not recent injury and not twisting   Relieved by:  Nothing Worsened by:  Nothing tried Ineffective treatments:  Muscle relaxants Associated symptoms: no abdominal pain, no abdominal swelling, no bladder incontinence, no bowel incontinence, no chest pain, no dysuria, no fever, no headaches, no leg pain, no numbness, no paresthesias, no pelvic pain, no perianal numbness, no tingling, no weakness and no weight loss     Past Medical History  Diagnosis Date  . Eczema   . CHF (congestive heart failure)     RESOLVED; HAD CARDIAC EVAL   . Congestive heart failure 2011    related to preeclampsia  . Hyperthyroidism AGE 33    NO TX  . Hypothyroidism AGE 33     NO TX  . Pregnancy induced hypertension 2011  . Preterm labor 2011  . Infection     UTI OCC  .  Infection 2013    TRICH  . Headache(784.0) 2011    MIGRAQINES; OTC  . Asthma 2009; [facility]    INHALER PRN   Past Surgical History  Procedure Laterality Date  . Tonsillectomy    . Wisdom tooth extraction    . Tear duct probing    . Cholecystectomy     Family History  Problem Relation Age of Onset  . Anesthesia problems Neg Hx   . Other Neg Hx   . Asthma Brother   . Learning disabilities Brother   . Asthma Daughter   . Learning disabilities Daughter   . Seizures Daughter   . Learning disabilities Other   . Colon cancer Maternal Grandmother    History  Substance Use Topics  . Smoking status: Never Smoker   . Smokeless tobacco: Never Used  . Alcohol Use: No   OB History   Grav Para Term Preterm Abortions TAB SAB Ect Mult Living   3 2 1 1 1  1   2      Obstetric Comments   2011 PIH;IOL; PP CHF-HOSPITALIZED X 1 WEEK     Review of Systems  Constitutional: Negative for fever and weight loss.  Cardiovascular: Negative for chest pain.  Gastrointestinal: Negative for abdominal pain and bowel incontinence.  Genitourinary: Negative for bladder incontinence, dysuria and pelvic pain.  Musculoskeletal: Positive for back pain.  Neurological: Negative for tingling, weakness, numbness, headaches and paresthesias.  All other systems reviewed and are negative.    Allergies  Review of patient's  allergies indicates no known allergies.  Home Medications   Current Outpatient Rx  Name  Route  Sig  Dispense  Refill  . albuterol (PROVENTIL HFA;VENTOLIN HFA) 108 (90 BASE) MCG/ACT inhaler   Inhalation   Inhale 2 puffs into the lungs every 6 (six) hours as needed for wheezing.   1 Inhaler   0   . bacitracin-neomycin-polymyxin-hydrocortisone (CORTISPORIN) 1 % ophthalmic ointment   Left Eye   Place into the left eye 2 (two) times daily. Until improvement of symptoms   3.5 g   0   . budesonide (PULMICORT FLEXHALER) 180 MCG/ACT inhaler   Inhalation   Inhale 2 puffs into the  lungs 2 (two) times daily.   1 Inhaler   4   . Fe Cbn-Fe Gluc-FA-B12-C-DSS (FERRALET 90) 90-1 MG TABS   Oral   Take 1 tablet by mouth daily.   90 each   3   . HYDROcodone-acetaminophen (NORCO/VICODIN) 5-325 MG per tablet   Oral   Take 1 tablet by mouth every 6 (six) hours as needed for pain.   30 tablet   0   . ibuprofen (ADVIL,MOTRIN) 600 MG tablet   Oral   Take 1 tablet (600 mg total) by mouth every 6 (six) hours as needed for pain.   30 tablet   2   . ibuprofen (ADVIL,MOTRIN) 600 MG tablet   Oral   Take 1 tablet (600 mg total) by mouth every 6 (six) hours as needed for pain.   30 tablet   0   . ibuprofen (ADVIL,MOTRIN) 800 MG tablet   Oral   Take 1 tablet (800 mg total) by mouth 3 (three) times daily.   21 tablet   0   . methocarbamol (ROBAXIN) 500 MG tablet   Oral   Take 1 tablet (500 mg total) by mouth 2 (two) times daily.   20 tablet   0   . methocarbamol (ROBAXIN) 500 MG tablet   Oral   Take 1 tablet (500 mg total) by mouth 2 (two) times daily.   20 tablet   0   . Prenatal Vit-Fe Fumarate-FA (PRENATAL MULTIVITAMIN) TABS   Oral   Take 1 tablet by mouth daily.         . Pseudoephedrine-Guaifenesin [phone] MG TB12   Oral   Take 1 capsule by mouth every 12 (twelve) hours as needed.   20 each   1   . traMADol (ULTRAM) 50 MG tablet   Oral   Take 1 tablet (50 mg total) by mouth every 6 (six) hours as needed for pain.   15 tablet   0    BP 111/68  Pulse 84  Temp(Src) 98 F (36.7 C) (Oral)  Resp 18  Ht 5' (1.524 m)  Wt 161 lb (73.029 kg)  BMI 31.44 kg/m2  SpO2 100%  Breastfeeding? No Physical Exam  Nursing note and vitals reviewed. Constitutional: She is oriented to person, place, and time. She appears well-developed and well-nourished. No distress.  HENT:  Head: Normocephalic and atraumatic.  Right Ear: External ear normal.  Left Ear: External ear normal.  Nose: Nose normal.  Mouth/Throat: Oropharynx is clear and moist. No  oropharyngeal exudate.  Eyes: Conjunctivae are normal. Pupils are equal, round, and reactive to light. No scleral icterus.  Neck: Normal range of motion. Neck supple.  Cardiovascular: Normal rate, regular rhythm and normal heart sounds.  Exam reveals no gallop and no friction rub.   No murmur heard. Pulmonary/Chest: Effort normal and breath  sounds normal. No respiratory distress. She has no wheezes. She has no rales. She exhibits no tenderness.  Abdominal: Soft. Bowel sounds are normal. She exhibits no distension. There is no tenderness.  Musculoskeletal: Normal range of motion. She exhibits tenderness. She exhibits no edema.       Back:  Lymphadenopathy:    She has no cervical adenopathy.  Neurological: She is alert and oriented to person, place, and time. She has normal reflexes. She displays normal reflexes. She exhibits normal muscle tone. Coordination normal.  Skin: Skin is warm and dry. No rash noted. No erythema. No pallor.  Psychiatric: She has a normal mood and affect. Her behavior is normal. Judgment and thought content normal.    ED Course  Procedures (including critical care time) Labs Review Labs Reviewed - No data to display Imaging Review No results found.  MDM   1. Lumbar strain, initial encounter    Patient with likely MSK lower back pain - there are no alarming signs to suggest cauda equina, epidural abscess, etc.  Plan to discharge on anti-inflammatories and muscle relaxation - to follow up with PCP.    Izola Price Marisue Humble, PA-C 08/21/13 1352

## 2013-08-21 NOTE — ED Provider Notes (Signed)
Medical screening examination/treatment/procedure(s) were performed by non-physician practitioner and as supervising physician I was immediately available for consultation/collaboration.   Stanton Kissoon B. Bernette Mayers, MD 08/21/13 2136

## 2013-09-23 ENCOUNTER — Inpatient Hospital Stay (HOSPITAL_COMMUNITY)
Admission: AD | Admit: 2013-09-23 | Discharge: 2013-09-23 | Disposition: A | Discharge status: Home / Self Care | Payer: Medicaid Other | Source: Ambulatory Visit | Attending: Obstetrics & Gynecology | Admitting: Obstetrics & Gynecology

## 2013-09-23 ENCOUNTER — Encounter (HOSPITAL_COMMUNITY): Payer: Self-pay | Admitting: *Deleted

## 2013-09-23 DIAGNOSIS — Z3201 Encounter for pregnancy test, result positive: Secondary | ICD-10-CM | POA: Insufficient documentation

## 2013-09-23 LAB — CBC
HCT: 37.2 % (ref 36.0–46.0)
Hemoglobin: 12.1 g/dL (ref 12.0–15.0)
MCH: 26.3 pg (ref 26.0–34.0)
MCHC: 32.5 g/dL (ref 30.0–36.0)

## 2013-09-23 LAB — HCG, QUANTITATIVE, PREGNANCY: hCG, Beta Chain, Quant, S: 110 m[IU]/mL — ABNORMAL HIGH (ref <5)

## 2013-09-23 NOTE — MAU Provider Note (Signed)
S: 22 y.o. [id number] @ [redacted]w[redacted]d presents to MAU with 2 faintly positive pregnancy tests at home, 2 negative tests.  She denies abdominal or back pain or vaginal bleeding.  O: BP 125/82  Pulse 113  Temp(Src) 97.2 F (36.2 C) (Oral)  Resp 18  LMP 08/22/2013 Results for orders placed during the hospital encounter of 09/23/13 (from the past 24 hour(s))  POCT PREGNANCY, URINE     Status: None   Collection Time    09/23/13  5:35 PM      Result Value Range   Preg Test, Ur NEGATIVE  NEGATIVE  CBC     Status: None   Collection Time    09/23/13  5:44 PM      Result Value Range   WBC 9.5  4.0 - 10.5 K/uL   RBC 4.60  3.87 - 5.11 MIL/uL   Hemoglobin 12.1  12.0 - 15.0 g/dL   HCT 45.4  09.8 - 11.9 %   MCV 80.9  78.0 - 100.0 fL   MCH 26.3  26.0 - 34.0 pg   MCHC 32.5  30.0 - 36.0 g/dL   RDW 14.7  82.9 - 56.2 %   Platelets 325  150 - 400 K/uL  HCG, QUANTITATIVE, PREGNANCY     Status: Abnormal   Collection Time    09/23/13  5:50 PM      Result Value Range   hCG, Beta Chain, Quant, S 110 (*) <5 mIU/mL   A: Positive blood pregnancy test  P: D/C home Pregnancy verification letter given Pt to begin prenatal care with previous provider Return to MAU as needed  Sharen Counter Certified Nurse-Midwife

## 2013-09-23 NOTE — MAU Provider Note (Signed)
Attestation of Attending Supervision of Advanced Practitioner (PA/CNM/NP): Evaluation and management procedures were performed by the Advanced Practitioner under my supervision and collaboration.  I have reviewed the Advanced Practitioner's note and chart, and I agree with the management and plan.  Jazlin Tapscott, MD, FACOG Attending Obstetrician & Gynecologist Faculty Practice, [facility]  

## 2013-09-23 NOTE — MAU Note (Signed)
Pt presents with complaints of not having her period last month and needs a pregnancy test

## 2013-10-10 ENCOUNTER — Inpatient Hospital Stay (HOSPITAL_COMMUNITY)
Admission: AD | Admit: 2013-10-10 | Discharge: 2013-10-10 | Disposition: A | Discharge status: Home / Self Care | Payer: Medicaid Other | Source: Ambulatory Visit | Attending: Obstetrics & Gynecology | Admitting: Obstetrics & Gynecology

## 2013-10-10 ENCOUNTER — Encounter (HOSPITAL_COMMUNITY): Payer: Self-pay | Admitting: *Deleted

## 2013-10-10 DIAGNOSIS — O99891 Other specified diseases and conditions complicating pregnancy: Secondary | ICD-10-CM | POA: Insufficient documentation

## 2013-10-10 DIAGNOSIS — J069 Acute upper respiratory infection, unspecified: Secondary | ICD-10-CM | POA: Insufficient documentation

## 2013-10-10 DIAGNOSIS — R05 Cough: Secondary | ICD-10-CM | POA: Insufficient documentation

## 2013-10-10 DIAGNOSIS — R51 Headache: Secondary | ICD-10-CM | POA: Insufficient documentation

## 2013-10-10 DIAGNOSIS — R059 Cough, unspecified: Secondary | ICD-10-CM | POA: Insufficient documentation

## 2013-10-10 MED ORDER — AZITHROMYCIN 250 MG PO TABS: ORAL_TABLET | ORAL | Status: DC

## 2013-10-10 MED ORDER — DM-GUAIFENESIN ER 30-600 MG PO TB12: 1.0000 | ORAL_TABLET | Freq: Two times a day (BID) | ORAL | Status: DC

## 2013-10-10 NOTE — MAU Note (Signed)
Coughing since Friday, voice hoarse on Sat, coughing so much is hurting in to chest. Productive cough, yellow mucous.  Denies fever.

## 2013-10-10 NOTE — MAU Provider Note (Signed)
History     CSN: [id number]  Arrival date and time: 10/10/13 0944   First Provider Initiated Contact with Patient 10/10/13 1038      Chief Complaint  Patient presents with  . Cough   Cough Associated symptoms include headaches and myalgias. Pertinent negatives include no chills, ear pain, fever, heartburn or rash.    Ms. Burkle is a 22 y/o [id number] who presents today at [redacted]w[redacted]d by LMP c/o cough and HA since 10/24. She had pregnancy confirmed via quant at MAU on 10/11, and has not established prenatal care yet.  She plans to go to [place] once her Medicaid is approved.  She is not taking any PNV.  She is a non- smoker, and does not use alcohol or any other drugs. She has NKDA and is not taking any medications.  Ms. Lazo reports that she developed a cough and HA on 10/24.  She states that the cough occurs intermittently during the day and night, and it has been productive with yellow-green mucous. She also reports intermittent HAs with scotoma since 10/24 as well.  Her HAs are like her normal migraines, but she has not taken any medications due to her pregnancy; they are normally alleviated by ibuprofen. She reports nasal congestion that is clear, increased sinus pressure, increased fatigue, decreased appetite, and LE muscle aches.  She denies sore throat, CP, SOB, N/V, diarrhea, constipation, abdominal or pelvic pain, vaginal bleeding or d/c, dysuria, fever or chills.  She also denies any sick contacts, and reports drinking plenty of fluids despite decreased appetite. She had a tonsillectomy when she was a child. No flu vaccine this year.  OB History   Grav Para Term Preterm Abortions TAB SAB Ect Mult Living   4 2 1 1 1  1   2      Obstetric Comments   2011 PIH;IOL; PP CHF-HOSPITALIZED X 1 WEEK      Past Medical History  Diagnosis Date  . Eczema   . CHF (congestive heart failure)     RESOLVED; HAD CARDIAC EVAL   . Congestive heart failure 2011    related to  preeclampsia  . Hyperthyroidism AGE 46    NO TX  . Hypothyroidism AGE 46     NO TX  . Pregnancy induced hypertension 2011  . Preterm labor 2011  . Infection     UTI OCC  . Infection 2013    TRICH  . Headache(784.0) 2011    MIGRAQINES; OTC  . Asthma 2009; [facility]STONE    INHALER PRN    Past Surgical History  Procedure Laterality Date  . Tonsillectomy    . Wisdom tooth extraction    . Tear duct probing    . Cholecystectomy      Family History  Problem Relation Age of Onset  . Anesthesia problems Neg Hx   . Other Neg Hx   . Asthma Brother   . Learning disabilities Brother   . Asthma Daughter   . Learning disabilities Daughter   . Seizures Daughter   . Learning disabilities Other   . Colon cancer Maternal Grandmother   . Cancer Maternal Grandmother     colon    History  Substance Use Topics  . Smoking status: Never Smoker   . Smokeless tobacco: Never Used  . Alcohol Use: No    Allergies: No Known Allergies  Prescriptions prior to admission  Medication Sig Dispense Refill  . albuterol (PROVENTIL HFA;VENTOLIN HFA) 108 (90 BASE) MCG/ACT inhaler Inhale 2 puffs  into the lungs every 6 (six) hours as needed for wheezing.  1 Inhaler  0    Review of Systems  Constitutional: Positive for malaise/fatigue and diaphoresis. Negative for fever and chills.       Mild diaphoresis of forehead with HA, per her usual HAs.  HENT: Positive for congestion. Negative for ear pain.   Eyes: Negative for blurred vision and photophobia.       Occasional scotoma with HAs, per her usual HAs.   Respiratory: Positive for cough. Negative for stridor.   Gastrointestinal: Negative for heartburn, nausea, vomiting, abdominal pain, diarrhea, constipation and blood in stool.  Genitourinary: Negative for dysuria and urgency.  Musculoskeletal: Positive for myalgias.       Bilateral LE soreness, no swelling  Skin: Negative for rash.  Neurological: Positive for headaches. Negative for dizziness and  weakness.   Physical Exam   Blood pressure 112/79, pulse 99, temperature 98.4 F (36.9 C), temperature source Oral, resp. rate 18, height 5\' 1"  (1.549 m), weight 82.101 kg (181 lb), last menstrual period 08/22/2013, SpO2 99.00%.  Physical Exam  Constitutional: She is oriented to person, place, and time. She appears well-developed and well-nourished. No distress.  Pt.  Appears fatigued, and is laying down with blankets.  HENT:  Head: Normocephalic.  Right Ear: External ear normal.  Left Ear: External ear normal.  Mouth/Throat: Oropharynx is clear and moist. No oropharyngeal exudate.  Clear nasal discharge with erythematous turbinates.   Eyes: Conjunctivae and EOM are normal. No scleral icterus.  Neck: Neck supple.  L occipital and L posterior chain adenopathy  Cardiovascular: Normal rate, regular rhythm, normal heart sounds and intact distal pulses.  Exam reveals no friction rub.   No murmur heard. Respiratory: Effort normal and breath sounds normal. No stridor. She has no wheezes. She has no rales.  GI: Soft. Bowel sounds are normal. She exhibits no distension. There is tenderness. There is no rebound.  Mild LUQ tenderness to palpation  Musculoskeletal: Normal range of motion. She exhibits no edema.  Lymphadenopathy:    She has cervical adenopathy.  Neurological: She is alert and oriented to person, place, and time.  Skin: Skin is warm. No rash noted. She is not diaphoretic.  Psychiatric: She has a normal mood and affect.    MAU Course  Procedures  MDM Pt. Evaluated, no strep-test performed due to normal PE Pt. D/C home with instructions  Assessment and Plan  A:   -Upper Respiratory Infection -Pregnancy, EGA [redacted]w[redacted]d via LMP on 9/9  P: -Rx: azithromycin 250mg  x 5days; 2 tablets day 1, 1 tablet day 2-5 -Tylenol prn for pain -Mucinex-DM for cough and congestion, 1 tablet Q12H -Continue to drink plenty of fluids for adequate hydration -Return to MAU with worsening  sx -Establish prenatal care with [facility]; return to [facility] if unable to be seen  Christiana Fuchs 10/10/2013, 11:01 AM  I saw this pt with PA student Christiana Fuchs and agree with assessment and management

## 2013-10-17 ENCOUNTER — Inpatient Hospital Stay (HOSPITAL_COMMUNITY)
Admission: AD | Admit: 2013-10-17 | Discharge: 2013-10-17 | Disposition: A | Discharge status: Home / Self Care | Payer: Medicaid Other | Source: Ambulatory Visit | Attending: Obstetrics & Gynecology | Admitting: Obstetrics & Gynecology

## 2013-10-17 ENCOUNTER — Encounter (HOSPITAL_COMMUNITY): Payer: Self-pay

## 2013-10-17 DIAGNOSIS — J45901 Unspecified asthma with (acute) exacerbation: Secondary | ICD-10-CM | POA: Insufficient documentation

## 2013-10-17 DIAGNOSIS — O99891 Other specified diseases and conditions complicating pregnancy: Secondary | ICD-10-CM | POA: Insufficient documentation

## 2013-10-17 DIAGNOSIS — R05 Cough: Secondary | ICD-10-CM | POA: Insufficient documentation

## 2013-10-17 DIAGNOSIS — A6000 Herpesviral infection of urogenital system, unspecified: Secondary | ICD-10-CM | POA: Insufficient documentation

## 2013-10-17 DIAGNOSIS — R059 Cough, unspecified: Secondary | ICD-10-CM | POA: Insufficient documentation

## 2013-10-17 DIAGNOSIS — O98519 Other viral diseases complicating pregnancy, unspecified trimester: Secondary | ICD-10-CM | POA: Insufficient documentation

## 2013-10-17 DIAGNOSIS — B009 Herpesviral infection, unspecified: Secondary | ICD-10-CM

## 2013-10-17 DIAGNOSIS — J45909 Unspecified asthma, uncomplicated: Secondary | ICD-10-CM

## 2013-10-17 DIAGNOSIS — G43909 Migraine, unspecified, not intractable, without status migrainosus: Secondary | ICD-10-CM | POA: Insufficient documentation

## 2013-10-17 LAB — URINE MICROSCOPIC-ADD ON

## 2013-10-17 LAB — URINALYSIS, ROUTINE W REFLEX MICROSCOPIC
Glucose, UA: NEGATIVE mg/dL
Hgb urine dipstick: NEGATIVE
Protein, ur: NEGATIVE mg/dL

## 2013-10-17 MED ORDER — SUMATRIPTAN SUCCINATE 100 MG PO TABS: ORAL_TABLET | ORAL | Status: DC

## 2013-10-17 MED ORDER — ALBUTEROL SULFATE HFA 108 (90 BASE) MCG/ACT IN AERS: 2.0000 | INHALATION_SPRAY | Freq: Four times a day (QID) | RESPIRATORY_TRACT | Status: DC | PRN

## 2013-10-17 MED ORDER — SUMATRIPTAN SUCCINATE 50 MG PO TABS
50.0000 mg | ORAL_TABLET | Freq: Once | ORAL | Status: AC
  Administered 2013-10-17: 50 mg via ORAL
  Filled 2013-10-17: qty 1

## 2013-10-17 MED ORDER — BUDESONIDE 180 MCG/ACT IN AEPB: 2.0000 | INHALATION_SPRAY | Freq: Two times a day (BID) | RESPIRATORY_TRACT | Status: DC

## 2013-10-17 MED ORDER — FAMCICLOVIR 500 MG PO TABS: 1500.0000 mg | ORAL_TABLET | Freq: Once | ORAL | Status: DC

## 2013-10-17 NOTE — MAU Provider Note (Signed)
Attestation of Attending Supervision of Advanced Practitioner (CNM/NP): Evaluation and management procedures were performed by the Advanced Practitioner under my supervision and collaboration.  I have reviewed the Advanced Practitioner's note and chart, and I agree with the management and plan.  HARRAWAY-SMITH, Caylin Nass 1:14 PM

## 2013-10-17 NOTE — MAU Note (Addendum)
Pt reports coughing for two weeks and has had bad headaches. Patient states she has had two cold sores for over a week.

## 2013-10-17 NOTE — MAU Note (Addendum)
Patient is in with c/o persisent non-productive cough and increasing headache (especially when she coughs) without relief from tylenol for two weeks. Patient states that she have the 2 cold sores in her upper lip for a week. Patient states that she usually get one cold sore at a times. She denies abdominal pain, vaginal bleeding or abnormal discharge. Patient states that she have chronic headache prior to pregnancy.

## 2013-10-17 NOTE — MAU Provider Note (Signed)
History     CSN: [id number]  Arrival date and time: 10/17/13 7829   None     Chief Complaint  Patient presents with  . Cough  . Headache   HPI Comments: Brenda Spencer 22 y.o. [redacted]w[redacted]d presents with complaints of coughing, headaches, and cold sores.  The patient was seen on 10/06/13 in the MAU for similar complaints and was given a course of azithromycin for an URI.  The patient completed the course of antiobiotics with no improvement in her symptoms.  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  She has a previous diagnosis of asthma, which she reports worsened with her last pregnancy.  Prior to that pregnancy she was using a rescue inhaler for her symptoms.  As her asthma progressed, a long acting daily inhaler was added.  After delivery of her last child, she was told that she no longer needed to use her inhalers.  She has not tried using them with these symptoms.  She also reports increasing frequency of migraines over the last 2 weeks.  She has a history of migraines prior to pregnancy, which she could tolerate without medication.  Since her "coughing spells" have started, the frequency has increased.  She describes them as "stabbing" in her frontal and temporal regions, and are aggravated by sounds and lights.  She has aura of scotoma prior to headache onset. Her headaches last 30 minutes if she does not have a "coughing spell" to all day.  At the onset of her headache she lays down in a dark room and applies pressure to her temples to ease the pain.  Her pain is rated at a 10.  Following her headache she has a period of diaphoresis.  She has taken tylenol for her headaches without relief.  One week ago she developed 2 cold sores on her upper lip.  She states that she previously  would only get one cold sore at a time. She has used [place] and [place] for symptom relief without success.  She denies fever, chills, nausea, or vomiting.       Cough Associated symptoms include headaches and shortness of breath (following coughing). Pertinent negatives include no hemoptysis or wheezing.  Headache  Associated symptoms include blurred vision (with migraine) and coughing (non-productive).    Past Medical History  Diagnosis Date  . Eczema   . CHF (congestive heart failure)     RESOLVED; HAD CARDIAC EVAL   . Congestive heart failure 2011    related to preeclampsia  . Hyperthyroidism AGE 56    NO TX  . Hypothyroidism AGE 56     NO TX  . Pregnancy induced hypertension 2011  . Preterm labor 2011  . Infection     UTI OCC  . Infection 2013    TRICH  . Headache(784.0) 2011    MIGRAQINES; OTC  . Asthma 2009; [facility]    INHALER PRN    Past Surgical History  Procedure Laterality Date  . Tonsillectomy    . Wisdom tooth extraction    . Tear duct probing    . Cholecystectomy      Family History  Problem Relation Age of Onset  . Anesthesia problems Neg Hx   . Other Neg Hx   .  Asthma Brother   . Learning disabilities Brother   . Asthma Daughter   . Learning disabilities Daughter   . Seizures Daughter   . Learning disabilities Other   . Colon cancer Maternal Grandmother   . Cancer Maternal Grandmother     colon    History  Substance Use Topics  . Smoking status: Never Smoker   . Smokeless tobacco: Never Used  . Alcohol Use: No    Allergies: No Known Allergies  Prescriptions prior to admission  Medication Sig Dispense Refill  . acetaminophen (TYLENOL) 325 MG tablet Take 650 mg by mouth every 6 (six) hours as needed (pain).      Marland Kitchen dextromethorphan-guaiFENesin (MUCINEX DM) 30-600 MG per 12 hr tablet Take 1 tablet by mouth every 12 (twelve) hours.  20 tablet  0  . albuterol (PROVENTIL HFA;VENTOLIN HFA) 108 (90 BASE) MCG/ACT inhaler Inhale 2 puffs  into the lungs every 6 (six) hours as needed for wheezing.  1 Inhaler  0    Review of Systems  Constitutional: Positive for diaphoresis (following migraine).  Eyes: Positive for blurred vision (with migraine).  Respiratory: Positive for cough (non-productive) and shortness of breath (following coughing). Negative for hemoptysis, sputum production and wheezing.   Cardiovascular: Negative.   Gastrointestinal: Negative.   Genitourinary: Negative.   Musculoskeletal: Negative.   Skin:       Cold sores to upper lip  Neurological: Negative.   Endo/Heme/Allergies: Negative.   Psychiatric/Behavioral: Negative.    Physical Exam   Blood pressure 119/92, pulse 81, temperature 98 F (36.7 C), temperature source Oral, resp. rate 16, height 5\' 1"  (1.549 m), weight 83.008 kg (183 lb), last menstrual period 08/22/2013, SpO2 100.00%.  Physical Exam  Constitutional: She is oriented to person, place, and time. She appears well-developed and well-nourished. No distress.  HENT:  Head: Normocephalic and atraumatic.  Eyes: Pupils are equal, round, and reactive to light.  Neck: Normal range of motion.  Cardiovascular: Normal rate, regular rhythm and normal heart sounds.   Respiratory: Breath sounds normal. No respiratory distress. She has no wheezes. She has no rales. She exhibits no tenderness.  Cough present  GI: Soft. Bowel sounds are normal.  Neurological: She is oriented to person, place, and time.  Skin: Skin is warm and dry. She is not diaphoretic.  Swollen top lip with visible vesicles, mild erythema present  Psychiatric: She has a normal mood and affect.   Results for orders placed during the hospital encounter of 10/17/13 (from the past 24 hour(s))  URINALYSIS, ROUTINE W REFLEX MICROSCOPIC     Status: Abnormal   Collection Time    10/17/13  9:00 AM      Result Value Range   Color, Urine YELLOW  YELLOW   APPearance HAZY (*) CLEAR   Specific Gravity, Urine 1.020  1.005 - 1.030   pH 6.0   5.0 - 8.0   Glucose, UA NEGATIVE  NEGATIVE mg/dL   Hgb urine dipstick NEGATIVE  NEGATIVE   Bilirubin Urine NEGATIVE  NEGATIVE   Ketones, ur NEGATIVE  NEGATIVE mg/dL   Protein, ur NEGATIVE  NEGATIVE mg/dL   Urobilinogen, UA 0.2  0.0 - 1.0 mg/dL   Nitrite NEGATIVE  NEGATIVE   Leukocytes, UA SMALL (*) NEGATIVE  URINE MICROSCOPIC-ADD ON     Status: Abnormal   Collection Time    10/17/13  9:00 AM      Result Value Range   Squamous Epithelial / LPF RARE  RARE   WBC, UA 7-10  <  3 WBC/hpf   Bacteria, UA FEW (*) RARE   MAU Course  Procedures  MDM   Assessment and Plan  A: asthma exacerbation     Migraine with aura     HSV-1 outbreak  P:  Restart albuterol 108 mcg/act PRN q 6 hours for coughing, shortness of breath, or wheezing and pulmicort 180 mcg/act BID daily for asthma control Start Imitrex 50 mg po at onset of migraines.  May repeat after 2 hours if no relief.  Not to exceed 2 doses in 24 hours. Famciclovir 1500 mg PO once for HSV-1 infection Provided education on migraine management and trigger control  [place], Meilissa E 10/17/2013, 10:49 AM   I examined pt and agree with documentation above and FNP-S plan of care. [facility]

## 2013-10-18 LAB — URINE CULTURE

## 2013-11-13 LAB — OB RESULTS CONSOLE HIV ANTIBODY (ROUTINE TESTING): HIV: NONREACTIVE

## 2013-11-13 LAB — OB RESULTS CONSOLE RPR: RPR: NONREACTIVE

## 2013-12-14 NOTE — L&D Delivery Note (Signed)
0224: Called to room for increased rectal pressure, SVE 5-6/90/+2.  Patient educated on fetal station and cervical dilation and encouraged to breath through contractions to avoid cervical swelling.    0245: Patient reporting increased rectal pressure and urge to push, SVE 8/90/+2. Patient coached through breathing and given counter pressure on lower back.  Patient able to breath and reported some relief.  Patient reporting need to push and stating baby coming.  Checked and complete dilation.  Patient delivered as below.   Delivery Note At 2:59 AM a viable female "Brenda Spencer" was delivered via Vaginal, Spontaneous Delivery (Presentation: Right Occiput Anterior ). Nuchal x1 noted and infant delivered through without issues. APGAR: 8,9 ; weight-unknown at current. Placenta delivered spontaneously, intact, and inspected with normal findings and 3V cord noted.  Anesthesia: Epidural  Episiotomy: None Lacerations: Clitoral Tear-Not repaired Suture Repair: None Est. Blood Loss (mL): 250  Mom to postpartum.  Baby to Couplet care / Skin to Skin. Mom desires PP BTL at 6 weeks and outpatient circumcision.  Gavin Pound, CNM, MSN 05/22/2014, 3:31 AM

## 2013-12-16 ENCOUNTER — Encounter (HOSPITAL_COMMUNITY): Payer: Self-pay | Admitting: Emergency Medicine

## 2013-12-16 ENCOUNTER — Emergency Department (HOSPITAL_COMMUNITY)
Admission: EM | Admit: 2013-12-16 | Discharge: 2013-12-16 | Disposition: A | Discharge status: Home / Self Care | Payer: Medicaid Other | Attending: Emergency Medicine | Admitting: Emergency Medicine

## 2013-12-16 DIAGNOSIS — R209 Unspecified disturbances of skin sensation: Secondary | ICD-10-CM | POA: Insufficient documentation

## 2013-12-16 DIAGNOSIS — N390 Urinary tract infection, site not specified: Secondary | ICD-10-CM

## 2013-12-16 DIAGNOSIS — O2390 Unspecified genitourinary tract infection in pregnancy, unspecified trimester: Secondary | ICD-10-CM | POA: Insufficient documentation

## 2013-12-16 DIAGNOSIS — Z79899 Other long term (current) drug therapy: Secondary | ICD-10-CM | POA: Insufficient documentation

## 2013-12-16 DIAGNOSIS — Z862 Personal history of diseases of the blood and blood-forming organs and certain disorders involving the immune mechanism: Secondary | ICD-10-CM | POA: Insufficient documentation

## 2013-12-16 DIAGNOSIS — Z8619 Personal history of other infectious and parasitic diseases: Secondary | ICD-10-CM | POA: Insufficient documentation

## 2013-12-16 DIAGNOSIS — J45909 Unspecified asthma, uncomplicated: Secondary | ICD-10-CM | POA: Insufficient documentation

## 2013-12-16 DIAGNOSIS — O9989 Other specified diseases and conditions complicating pregnancy, childbirth and the puerperium: Secondary | ICD-10-CM | POA: Insufficient documentation

## 2013-12-16 DIAGNOSIS — I509 Heart failure, unspecified: Secondary | ICD-10-CM | POA: Insufficient documentation

## 2013-12-16 DIAGNOSIS — Z872 Personal history of diseases of the skin and subcutaneous tissue: Secondary | ICD-10-CM | POA: Insufficient documentation

## 2013-12-16 DIAGNOSIS — Z8639 Personal history of other endocrine, nutritional and metabolic disease: Secondary | ICD-10-CM | POA: Insufficient documentation

## 2013-12-16 DIAGNOSIS — IMO0002 Reserved for concepts with insufficient information to code with codable children: Secondary | ICD-10-CM | POA: Insufficient documentation

## 2013-12-16 DIAGNOSIS — G43109 Migraine with aura, not intractable, without status migrainosus: Secondary | ICD-10-CM

## 2013-12-16 LAB — URINALYSIS, ROUTINE W REFLEX MICROSCOPIC
Bilirubin Urine: NEGATIVE
Glucose, UA: NEGATIVE mg/dL
HGB URINE DIPSTICK: NEGATIVE
KETONES UR: 15 mg/dL — AB
NITRITE: NEGATIVE
Protein, ur: NEGATIVE mg/dL
Specific Gravity, Urine: 1.025 (ref 1.005–1.030)
Urobilinogen, UA: 1 mg/dL (ref 0.0–1.0)
pH: 6.5 (ref 5.0–8.0)

## 2013-12-16 LAB — CBC
HCT: 35.6 % — ABNORMAL LOW (ref 36.0–46.0)
HEMOGLOBIN: 12 g/dL (ref 12.0–15.0)
MCH: 28.8 pg (ref 26.0–34.0)
MCHC: 33.7 g/dL (ref 30.0–36.0)
MCV: 85.4 fL (ref 78.0–100.0)
Platelets: 249 10*3/uL (ref 150–400)
RBC: 4.17 MIL/uL (ref 3.87–5.11)
RDW: 14.8 % (ref 11.5–15.5)
WBC: 9.3 10*3/uL (ref 4.0–10.5)

## 2013-12-16 LAB — URINE MICROSCOPIC-ADD ON

## 2013-12-16 MED ORDER — CEPHALEXIN 500 MG PO CAPS: 500.0000 mg | ORAL_CAPSULE | Freq: Four times a day (QID) | ORAL | Status: DC

## 2013-12-16 MED ORDER — SODIUM CHLORIDE 0.9 % IV BOLUS (SEPSIS)
1000.0000 mL | Freq: Once | INTRAVENOUS | Status: AC
  Administered 2013-12-16: 1000 mL via INTRAVENOUS

## 2013-12-16 MED ORDER — METOCLOPRAMIDE HCL 5 MG/ML IJ SOLN
10.0000 mg | Freq: Once | INTRAMUSCULAR | Status: AC
Start: 2013-12-16 — End: 2013-12-16
  Administered 2013-12-16: 10 mg via INTRAVENOUS
  Filled 2013-12-16: qty 2

## 2013-12-16 MED ORDER — DIPHENHYDRAMINE HCL 50 MG/ML IJ SOLN
25.0000 mg | Freq: Once | INTRAMUSCULAR | Status: AC
  Administered 2013-12-16: 25 mg via INTRAVENOUS
  Filled 2013-12-16: qty 1

## 2013-12-16 MED ORDER — DEXAMETHASONE SODIUM PHOSPHATE 10 MG/ML IJ SOLN
10.0000 mg | Freq: Once | INTRAMUSCULAR | Status: AC
  Administered 2013-12-16: 10 mg via INTRAVENOUS
  Filled 2013-12-16: qty 1

## 2013-12-16 NOTE — ED Notes (Signed)
Pt reports she has been having on and off for the past 3 months, reports it seems like the headaches are getting worse. Reports today when the HA started she was at work, hadn't been feeling well, then her arm started feeling numbness in left arm (had same feeling yesterday) then got a headache later on. Numbness is still there but has resolved some. Some blurred vision. This is pt's 4th baby, has 2 living kids. CP last night. Denies vaginal bleeding. C/o bilateral lower abd pain and has been seen, had abnormal pap smear and is supposed to follow up with OB/GYN. Nad, skin warm and dry, resp e/u.

## 2013-12-16 NOTE — ED Provider Notes (Signed)
CSN: [id number]     Arrival date & time 12/16/13  1123 History   First MD Initiated Contact with Patient 12/16/13 1127     Chief Complaint  Patient presents with  . Headache   (Consider location/radiation/quality/duration/timing/severity/associated sxs/prior Treatment) Patient is a 23 y.o. female presenting with headaches. The history is provided by the patient.  Headache Pain location:  Frontal Quality:  Sharp Radiates to:  Does not radiate Severity currently:  10/10 Severity at highest:  10/10 Onset quality:  Sudden Timing:  Intermittent Progression:  Waxing and waning Chronicity:  Chronic Similar to prior headaches: yes   Context: bright light   Context: not caffeine, not loud noise and not straining   Context comment:  At work Relieved by:  Nothing Worsened by:  Nothing tried Ineffective treatments:  None tried Associated symptoms: numbness (mild, L arm/hand)   Associated symptoms: no abdominal pain, no cough, no fever, no nausea and no URI     Past Medical History  Diagnosis Date  . Eczema   . CHF (congestive heart failure)     RESOLVED; HAD CARDIAC EVAL   . Congestive heart failure 2011    related to preeclampsia  . Hyperthyroidism AGE 55    NO TX  . Hypothyroidism AGE 89     NO TX  . Pregnancy induced hypertension 2011  . Preterm labor 2011  . Infection     UTI OCC  . Infection 2013    [employer]  . Headache(784.0) 2011    MIGRAQINES; OTC  . Asthma 2009; [facility]ONE    INHALER PRN   Past Surgical History  Procedure Laterality Date  . Tonsillectomy    . Wisdom tooth extraction    . Tear duct probing    . Cholecystectomy     Family History  Problem Relation Age of Onset  . Anesthesia problems Neg Hx   . Other Neg Hx   . Asthma Brother   . Learning disabilities Brother   . Asthma Daughter   . Learning disabilities Daughter   . Seizures Daughter   . Learning disabilities Other   . Colon cancer Maternal Grandmother   . Cancer Maternal Grandmother      colon   History  Substance Use Topics  . Smoking status: Never Smoker   . Smokeless tobacco: Never Used  . Alcohol Use: No   OB History   Grav Para Term Preterm Abortions TAB SAB Ect Mult Living   4 2 1 1 1  1   2      Obstetric Comments   2011 PIH;IOL; PP CHF-HOSPITALIZED X 1 WEEK     Review of Systems  Constitutional: Negative for fever.  Respiratory: Negative for cough and shortness of breath.   Gastrointestinal: Negative for nausea and abdominal pain.  Neurological: Positive for numbness (mild, L arm/hand) and headaches.  All other systems reviewed and are negative.    Allergies  Review of patient's allergies indicates no known allergies.  Home Medications   Current Outpatient Rx  Name  Route  Sig  Dispense  Refill  . acetaminophen (TYLENOL) 325 MG tablet   Oral   Take 650 mg by mouth every 6 (six) hours as needed (pain).         Marland Kitchen albuterol (PROVENTIL HFA;VENTOLIN HFA) 108 (90 BASE) MCG/ACT inhaler   Inhalation   Inhale 2 puffs into the lungs every 6 (six) hours as needed for wheezing.   1 Inhaler   4   . budesonide (PULMICORT) 180 MCG/ACT  inhaler   Inhalation   Inhale 2 puffs into the lungs 2 (two) times daily.   1 Inhaler   6   . dextromethorphan-guaiFENesin (MUCINEX DM) 30-600 MG per 12 hr tablet   Oral   Take 1 tablet by mouth every 12 (twelve) hours.   20 tablet   0   . famciclovir (FAMVIR) 500 MG tablet   Oral   Take 3 tablets (1,500 mg total) by mouth once.   3 tablet   3   . SUMAtriptan (IMITREX) 100 MG tablet      Take 1/2 pill at onset of headache.  May repeat in 2 hours if headache persists or recurs x 1.   9 tablet   11    BP 128/86  Pulse 104  Temp(Src) 98.1 F (36.7 C) (Oral)  Resp 18  SpO2 98%  LMP 08/22/2013 Physical Exam  Nursing note and vitals reviewed. Constitutional: She is oriented to person, place, and time. She appears well-developed and well-nourished. No distress.  HENT:  Head: Normocephalic and atraumatic.   Eyes: EOM are normal. Pupils are equal, round, and reactive to light.  Neck: Normal range of motion. Neck supple.  Cardiovascular: Normal rate and regular rhythm.  Exam reveals no friction rub.   No murmur heard. Pulmonary/Chest: Effort normal and breath sounds normal. No respiratory distress. She has no wheezes. She has no rales.  Abdominal: Soft. She exhibits no distension. There is no tenderness. There is no rebound.  Musculoskeletal: Normal range of motion. She exhibits no edema.  Neurological: She is alert and oriented to person, place, and time. A sensory deficit (pins and needles sensation in glove like pattern on L arm) is present. No cranial nerve deficit. She exhibits normal muscle tone. Coordination normal. GCS eye subscore is 4. GCS verbal subscore is 5. GCS motor subscore is 6.  Skin: She is not diaphoretic.    ED Course  Procedures (including critical care time) Labs Review Labs Reviewed  URINALYSIS, ROUTINE W REFLEX MICROSCOPIC  CBC   Imaging Review No results found.  EKG Interpretation   None       MDM   1. Complicated migraine   2. UTI (lower urinary tract infection)    23 year old female with history of migraines presents with headache, blurred vision, left arm numbness and tingling. Patient is currently [redacted] weeks pregnant has a history of preeclampsia. Headaches have been coming and going, this was started this morning. She denies any history of complicated migraines. Patient states some blurry vision and some left arm tingling. The tingling in her left arm is coming and going. Patient has stable vitals. No hypertension, no right upper quadrant pain. Patient is less than [redacted] weeks pregnant, so she cannot be preeclamptic. The patient has normal cranial nerves, normal strength in upper extremities. She has altered light touch sensation in her left upper extremity. I think patient has a complicated migraine. We'll treat with migraine cocktail fluids. Feeling much  improved after migraine cocktail and ready to go home. Treated bacteruria with keflex. Stable for discharge.     Osvaldo Shipper, MD 12/16/13 [phone]

## 2013-12-16 NOTE — ED Notes (Signed)
Pt ambulated to restroom. 

## 2013-12-16 NOTE — Discharge Instructions (Signed)
Recurrent Migraine Headache A migraine headache is an intense, throbbing pain on one or both sides of your head. Recurrent migraines keep coming back. A migraine can last for 30 minutes to several hours. CAUSES  The exact cause of a migraine headache is not always known. However, a migraine may be caused when nerves in the brain become irritated and release chemicals that cause inflammation. This causes pain.  SYMPTOMS   Pain on one or both sides of your head.  Pulsating or throbbing pain.  Severe pain that prevents daily activities.  Pain that is aggravated by any physical activity.  Nausea, vomiting, or both.  Dizziness.  Pain with exposure to bright lights, loud noises, or activity.  General sensitivity to bright lights, loud noises, or smells. Before you get a migraine, you may get warning signs that a migraine is coming (aura). An aura may include:  Seeing flashing lights.  Seeing bright spots, halos, or zig-zag lines.  Having tunnel vision or blurred vision.  Having feelings of numbness or tingling.  Having trouble talking.  Having muscle weakness. MIGRAINE TRIGGERS Examples of triggers of migraine headaches include:   Alcohol.  Smoking.  Stress.  Menstruation.  Aged cheeses.  Foods or drinks that contain nitrates, glutamate, aspartame, or tyramine.  Lack of sleep.  Chocolate.  Caffeine.  Hunger.  Physical exertion.  Fatigue.  Medicines used to treat chest pain (nitroglycerine), birth control pills, estrogen, and some blood pressure medicines. DIAGNOSIS  A recurrent migraine headache is often diagnosed based on:  Symptoms.  Physical examination.  A CT scan or MRI of your head. TREATMENT  Medicines may be given for pain and nausea. Medicines can also be given to help prevent recurrent migraines. HOME CARE INSTRUCTIONS  Only take over-the-counter or prescription medicines for pain or discomfort as directed by your caregiver. The use of  long-term narcotics is not recommended.  Lie down in a dark, quiet room when you have a migraine.  Keep a journal to find out what may trigger your migraine headaches. For example, write down:  What you eat and drink.  How much sleep you get.  Any change to your diet or medicines.  Limit alcohol consumption.  Quit smoking if you smoke.  Get 7 to 9 hours of sleep, or as recommended by your caregiver.  Limit stress.  Keep lights dim if bright lights bother you and make your migraines worse. SEEK MEDICAL CARE IF:   You do not get relief from the medicines given to you.  You have a recurrence of pain. SEEK IMMEDIATE MEDICAL CARE IF:  Your migraine becomes severe.  You have a fever.  You have a stiff neck.  You have loss of vision.  You have muscular weakness or loss of muscle control.  You start losing your balance or have trouble walking.  You feel faint or pass out.  You have severe symptoms that are different from your first symptoms. MAKE SURE YOU:   Understand these instructions.  Will watch your condition.  Will get help right away if you are not doing well or get worse. Document Released: 08/25/2001 Document Revised: 02/22/2012 Document Reviewed: 11/20/2011 [facility] Patient Information 2014 [place], [place].  Urinary Tract Infection A urinary tract infection (UTI) can occur any place along the urinary tract. The tract includes the kidneys, ureters, bladder, and urethra. A type of germ called bacteria often causes a UTI. UTIs are often helped with antibiotic medicine.  HOME CARE   If given, take antibiotics as told  by your doctor. Finish them even if you start to feel better.  Drink enough fluids to keep your pee (urine) clear or pale yellow.  Avoid tea, drinks with caffeine, and bubbly (carbonated) drinks.  Pee often. Avoid holding your pee in for a long time.  Pee before and after having sex (intercourse).  Wipe from front to back after you poop  (bowel movement) if you are a woman. Use each tissue only once. GET HELP RIGHT AWAY IF:   You have back pain.  You have lower belly (abdominal) pain.  You have chills.  You feel sick to your stomach (nauseous).  You throw up (vomit).  Your burning or discomfort with peeing does not go away.  You have a fever.  Your symptoms are not better in 3 days. MAKE SURE YOU:   Understand these instructions.  Will watch your condition.  Will get help right away if you are not doing well or get worse. Document Released: 05/18/2008 Document Revised: 08/24/2012 Document Reviewed: 06/30/2012 [facility] Patient Information 2014 [place], [place].

## 2013-12-16 NOTE — ED Notes (Signed)
Pt was at work when she started to get a HA, blurred vision and left arm numbness. Pt is [redacted] weeks pregnant. Has hx of preeclampsia. BP130/82 HR 100 RR 16. 18G in right arm.

## 2014-01-09 ENCOUNTER — Ambulatory Visit (INDEPENDENT_AMBULATORY_CARE_PROVIDER_SITE_OTHER): Payer: Medicaid Other | Admitting: Neurology

## 2014-01-09 ENCOUNTER — Encounter: Payer: Self-pay | Admitting: Neurology

## 2014-01-09 VITALS — BP 110/74 | HR 84 | Temp 98.1°F | Ht 60.0 in | Wt 189.0 lb

## 2014-01-09 DIAGNOSIS — G43719 Chronic migraine without aura, intractable, without status migrainosus: Secondary | ICD-10-CM

## 2014-01-09 DIAGNOSIS — Z3492 Encounter for supervision of normal pregnancy, unspecified, second trimester: Secondary | ICD-10-CM

## 2014-01-09 NOTE — Progress Notes (Signed)
Subjective:    Patient ID: Brenda Spencer is a 23 y.o. female.  HPI    Star Age, MD, PhD [facility] [address], [address] [place], [place] [address]  Dear Dr. Mancel Bale,   I saw your patient, Brenda Spencer, upon your kind request in my neurologic clinic today for initial consultation of her migraine headaches. The patient is unaccompanied today. As you know, Brenda Spencer is a 23 year old right-handed woman with an underlying medical history of asthma, allergies, preeclampsia, currently [redacted] weeks pregnant, who has had recurrent migraines since teenage years, age 15. She presented to the emergency room on 12/16/2013 with a migraine associated with arm tingling and arm numbness on the left as well as some blurry vision. I reviewed those records from the emergency room visit. She was diagnosed with the UTI as well as complicated migraine. She was treated with IV Benadryl, Reglan, and dexamethasone. She was also treated for her UTI. Outpatient treatment included magnesium, 200-400 mg daily, Phenergan as needed and ibuprofen 600 mg up to 3 times a day as needed. She feels that during this pregnancy her migraines have become worse. She has nearly daily headaches. She has been using ibuprofen. She has not been using magnesium. She describes a throbbing headache, primarily in the bifrontal and bitemporal area, associated with photophobia and blurry vision. She has not had any more arm tingling. She's never had any weakness or slurring of speech or droopy face. She has a family history of migraines in her mother. During her other pregnancies she did not have exacerbation of her migraines. She has 2 young daughters. She is expecting a boy. Currently she feels well. She has no blurry vision. She has some associated vomiting at times but typically no nausea.   Her Past Medical History Is Significant For: Past Medical History  Diagnosis Date  . Eczema   . CHF  (congestive heart failure)     RESOLVED; HAD CARDIAC EVAL   . Congestive heart failure 2011    related to preeclampsia  . Hyperthyroidism AGE 6    NO TX  . Hypothyroidism AGE 76     NO TX  . Pregnancy induced hypertension 2011  . Preterm labor 2011  . Infection     UTI OCC  . Infection 2013    [place]  . Headache(784.0) 2011    MIGRAQINES; OTC  . Asthma 2009; [facility]    INHALER PRN    Her Past Surgical History Is Significant For: Past Surgical History  Procedure Laterality Date  . Tonsillectomy    . Wisdom tooth extraction    . Tear duct probing    . Cholecystectomy      Her Family History Is Significant For: Family History  Problem Relation Age of Onset  . Anesthesia problems Neg Hx   . Other Neg Hx   . Asthma Brother   . Learning disabilities Brother   . Asthma Daughter   . Learning disabilities Daughter   . Seizures Daughter   . Learning disabilities Other   . Colon cancer Maternal Grandmother   . Cancer Maternal Grandmother     colon    Her Social History Is Significant For: History   Social History  . Marital Status: Married    Spouse Name: CHRISTOPHER    Number of Children: 1  . Years of Education: 12   Occupational History  . HOMEMAKER    Social History Main Topics  . Smoking status: Never Smoker   .  Smokeless tobacco: Never Used  . Alcohol Use: No  . Drug Use: No  . Sexual Activity: Yes    Partners: Male    Birth Control/ Protection: None   Other Topics Concern  . None   Social History Narrative  . None    Her Allergies Are:  No Known Allergies:   Her Current Medications Are:  Outpatient Encounter Prescriptions as of 01/09/2014  Medication Sig  . albuterol (PROVENTIL HFA;VENTOLIN HFA) 108 (90 BASE) MCG/ACT inhaler Inhale 2 puffs into the lungs every 6 (six) hours as needed for wheezing.  . budesonide (PULMICORT) 180 MCG/ACT inhaler Inhale 2 puffs into the lungs 2 (two) times daily.  . cephALEXin (KEFLEX) 500 MG capsule Take 1  capsule (500 mg total) by mouth 4 (four) times daily.  . Prenatal Multivit-Min-Fe-FA (PRE-NATAL PO) Take 1 tablet by mouth daily.  . SUMAtriptan (IMITREX) 100 MG tablet Take 1/2 pill at onset of headache.  May repeat in 2 hours if headache persists or recurs x 1.  :  Review of Systems:  Out of a complete 14 point review of systems, all are reviewed and negative with the exception of these symptoms as listed below:  Review of Systems  Constitutional: Negative.   Eyes: Positive for visual disturbance (blurred vision, diplopia).  Respiratory: Negative.   Cardiovascular: Negative.   Gastrointestinal: Negative.   Endocrine: Negative.   Genitourinary: Negative.   Musculoskeletal:       Cramps  Skin: Negative.   Allergic/Immunologic: Negative.   Neurological: Positive for dizziness and headaches.  Hematological: Negative.   Psychiatric/Behavioral: Negative.     Objective:  Neurologic Exam  Physical Exam Physical Examination:   Filed Vitals:   01/09/14 1342  BP: 110/74  Pulse: 84  Temp: 98.1 F (36.7 C)    General Examination: The patient is a very pleasant 23 y.o. female in no acute distress. She appears well-developed and well-nourished and adequately groomed.   HEENT: Normocephalic, atraumatic, pupils are equal, round and reactive to light and accommodation. Funduscopic exam is normal with sharp disc margins noted. Extraocular tracking is good without limitation to gaze excursion or nystagmus noted. Normal smooth pursuit is noted. Hearing is grossly intact. Tympanic membranes are clear bilaterally. Visual fields are full by finger perimetry.  Face is symmetric with normal facial animation and normal facial sensation. Speech is clear with no dysarthria noted. There is no hypophonia. There is no lip, neck/head, jaw or voice tremor. Neck is supple with full range of passive and active motion. There are no carotid bruits on auscultation. Oropharynx exam reveals: mild mouth dryness,  adequate dental hygiene and mild airway crowding, due to narrow airway entry, she has a tongue piercing. Mallampati is class II. Tongue protrudes centrally and palate elevates symmetrically. Tonsils are absent.   Chest: Clear to auscultation without wheezing, rhonchi or crackles noted.  Heart: S1+S2+0, regular and normal without murmurs, rubs or gallops noted.   Abdomen: Soft, non-tender and non-distended with normal bowel sounds appreciated on auscultation.  Extremities: There is no pitting edema in the distal lower extremities bilaterally. Pedal pulses are intact.  Skin: Warm and dry without trophic changes noted. There are no varicose veins.  Musculoskeletal: exam reveals no obvious joint deformities, tenderness or joint swelling or erythema.   Neurologically:  Mental status: The patient is awake, alert and oriented in all 4 spheres. Her memory, attention, language and knowledge are appropriate. There is no aphasia, agnosia, apraxia or anomia. Speech is clear with normal prosody and enunciation.  Thought process is linear. Mood is congruent and affect is normal.  Cranial nerves are as described above under HEENT exam. In addition, shoulder shrug is normal with equal shoulder height noted. Motor exam: Normal bulk, strength and tone is noted. There is no drift, tremor or rebound. Romberg is negative. Reflexes are 2+ throughout. Toes are downgoing bilaterally. Fine motor skills are intact with normal finger taps, normal hand movements, normal rapid alternating patting, normal foot taps and normal foot agility.  Cerebellar testing shows no dysmetria or intention tremor on finger to nose testing. Heel to shin is unremarkable bilaterally. There is no truncal or gait ataxia.  Sensory exam is intact to light touch, pinprick, vibration, temperature sense in the upper and lower extremities.  Gait, station and balance are unremarkable. No veering to one side is noted. No leaning to one side is noted.  Posture is age-appropriate and stance is narrow based. No problems turning are noted. She turns en bloc. Tandem walk is unremarkable. Intact toe and heel stance is noted.               Assessment and Plan:   In summary, Brenda Spencer is a very pleasant 23 y.o.-year old female with an underlying medical history of asthma, allergies, preeclampsia, currently [redacted] weeks pregnant, who presents with a history of migraines since age 26 and worsening migraines since this pregnancy. She is currently in the second trimester. She is advised that at this point she will have to take medications as needed as prescribed and deemed safe by you. Her exam is nonfocal. She is encouraged to followup with me after she delivers. She is not planning on breast-feeding. She may also find that she has an improved migraine frequency after her pregnancy. Nevertheless,  a at this time, she is reassured that she has a nonfocal neurological exam and she is encouraged to make an appointment with me in the future after she has delivered. We will talk about preventative medications and abortive medications at the time. She was in agreement. Thank you very much for allowing me to participate in the care of this nice patient. If I can be of any further assistance to you please do not hesitate to call me at [phone].  Sincerely,   Star Age, MD, PhD

## 2014-01-09 NOTE — Patient Instructions (Signed)
Please remember, common headache triggers are: sleep deprivation, dehydration, overheating, stress, hypoglycemia or skipping meals and blood sugar fluctuations, excessive pain medications or excessive alcohol use or caffeine withdrawal. Some people have food triggers such as aged cheese, orange juice or chocolate, especially dark chocolate, or MSG (monosodium glutamate). Try to avoid these headache triggers as much possible. It may be helpful to keep a headache diary to figure out what makes your headaches worse or brings them on and what alleviates them. Some people report headache onset after exercise but studies have shown that regular exercise may actually prevent headaches from coming. If you have exercise-induced headaches, please make sure that you drink plenty of fluid before and after exercising and that you do not over do it and do not overheat.   

## 2014-02-07 LAB — OB RESULTS CONSOLE GC/CHLAMYDIA
CHLAMYDIA, DNA PROBE: NEGATIVE
GC PROBE AMP, GENITAL: NEGATIVE

## 2014-02-09 ENCOUNTER — Inpatient Hospital Stay (HOSPITAL_COMMUNITY)
Admission: AD | Admit: 2014-02-09 | Discharge: 2014-02-09 | Disposition: A | Discharge status: Home / Self Care | Payer: Medicaid Other | Source: Ambulatory Visit | Attending: Obstetrics and Gynecology | Admitting: Obstetrics and Gynecology

## 2014-02-09 ENCOUNTER — Encounter (HOSPITAL_COMMUNITY): Payer: Self-pay | Admitting: *Deleted

## 2014-02-09 DIAGNOSIS — R0789 Other chest pain: Secondary | ICD-10-CM | POA: Insufficient documentation

## 2014-02-09 DIAGNOSIS — O99891 Other specified diseases and conditions complicating pregnancy: Secondary | ICD-10-CM | POA: Insufficient documentation

## 2014-02-09 DIAGNOSIS — O9989 Other specified diseases and conditions complicating pregnancy, childbirth and the puerperium: Principal | ICD-10-CM

## 2014-02-09 LAB — URINALYSIS, ROUTINE W REFLEX MICROSCOPIC
Bilirubin Urine: NEGATIVE
Glucose, UA: NEGATIVE mg/dL
Hgb urine dipstick: NEGATIVE
KETONES UR: NEGATIVE mg/dL
LEUKOCYTES UA: NEGATIVE
Nitrite: NEGATIVE
PH: 6 (ref 5.0–8.0)
Protein, ur: NEGATIVE mg/dL
SPECIFIC GRAVITY, URINE: 1.025 (ref 1.005–1.030)
Urobilinogen, UA: 1 mg/dL (ref 0.0–1.0)

## 2014-02-09 LAB — CBC WITH DIFFERENTIAL/PLATELET
BASOS PCT: 0 % (ref 0–1)
Basophils Absolute: 0 10*3/uL (ref 0.0–0.1)
Eosinophils Absolute: 0.1 10*3/uL (ref 0.0–0.7)
Eosinophils Relative: 1 % (ref 0–5)
HCT: 30.6 % — ABNORMAL LOW (ref 36.0–46.0)
HEMOGLOBIN: 10.2 g/dL — AB (ref 12.0–15.0)
LYMPHS PCT: 18 % (ref 12–46)
Lymphs Abs: 2.1 10*3/uL (ref 0.7–4.0)
MCH: 28.7 pg (ref 26.0–34.0)
MCHC: 33.3 g/dL (ref 30.0–36.0)
MCV: 86 fL (ref 78.0–100.0)
MONO ABS: 0.9 10*3/uL (ref 0.1–1.0)
MONOS PCT: 8 % (ref 3–12)
NEUTROS ABS: 8.4 10*3/uL — AB (ref 1.7–7.7)
NEUTROS PCT: 73 % (ref 43–77)
Platelets: 252 10*3/uL (ref 150–400)
RBC: 3.56 MIL/uL — ABNORMAL LOW (ref 3.87–5.11)
RDW: 13.8 % (ref 11.5–15.5)
WBC: 11.5 10*3/uL — ABNORMAL HIGH (ref 4.0–10.5)

## 2014-02-09 MED ORDER — PANTOPRAZOLE SODIUM 40 MG PO TBEC: 40.0000 mg | DELAYED_RELEASE_TABLET | Freq: Every day | ORAL | Status: DC

## 2014-02-09 MED ORDER — GI COCKTAIL ~~LOC~~
30.0000 mL | Freq: Once | ORAL | Status: AC
  Administered 2014-02-09: 30 mL via ORAL
  Filled 2014-02-09: qty 30

## 2014-02-09 NOTE — MAU Provider Note (Signed)
History   23yo, [id number] at [redacted]w[redacted]d presents with c/o upper mid chest tightness, since 2/21, worse at night.  No change in pain in relation to eating.  Pt denies recent illness, or injury, but feels like she is coming down with something. States her left arm got numb last night. Denies VB, UCs, LOF, recent fever, resp or GI c/o's, UTI or PIH s/s. +FM. Also pt c/o left hip pain when she walking.  Pregnancy problem list Disorder of the thyroid gland H/o pre-e with PP CHF - G1   Chief Complaint  Patient presents with  . Chest Pain   The history is provided by the patient.    OB History   Grav Para Term Preterm Abortions TAB SAB Ect Mult Living   4 2 1 1 1  1   2      Obstetric Comments   2011 PIH;IOL; PP CHF-HOSPITALIZED X 1 WEEK      Past Medical History  Diagnosis Date  . Eczema   . CHF (congestive heart failure)     RESOLVED; HAD CARDIAC EVAL   . Congestive heart failure 2011    related to preeclampsia  . Hyperthyroidism AGE 23    NO TX  . Hypothyroidism AGE 23     NO TX  . Pregnancy induced hypertension 2011  . Preterm labor 2011  . Infection     UTI OCC  . Infection 2013    [place]  . Headache(784.0) 2011    MIGRAQINES; OTC  . Asthma 2009; [facility]    INHALER PRN    Past Surgical History  Procedure Laterality Date  . Tonsillectomy    . Wisdom tooth extraction    . Tear duct probing    . Cholecystectomy      Family History  Problem Relation Age of Onset  . Anesthesia problems Neg Hx   . Other Neg Hx   . Asthma Brother   . Learning disabilities Brother   . Asthma Daughter   . Learning disabilities Daughter   . Seizures Daughter   . Learning disabilities Other   . Colon cancer Maternal Grandmother   . Cancer Maternal Grandmother     colon    History  Substance Use Topics  . Smoking status: Never Smoker   . Smokeless tobacco: Never Used  . Alcohol Use: No    Allergies: No Known Allergies  Prescriptions prior to admission  Medication Sig  Dispense Refill  . albuterol (PROVENTIL HFA;VENTOLIN HFA) 108 (90 BASE) MCG/ACT inhaler Inhale 2 puffs into the lungs every 6 (six) hours as needed for wheezing.  1 Inhaler  4  . budesonide (PULMICORT) 180 MCG/ACT inhaler Inhale 2 puffs into the lungs 2 (two) times daily.  1 Inhaler  6  . Prenatal Vit-Fe Fumarate-FA (PRENATAL MULTIVITAMIN) TABS tablet Take 1 tablet by mouth daily at 12 noon.        ROS ROS: see HPI above, all other systems are negative  Physical Exam   Blood pressure 121/80, pulse 97, temperature 98.2 F (36.8 C), temperature source Oral, resp. rate 16, height 5' 0.5" (1.537 m), weight 192 lb 12.8 oz (87.454 kg), last menstrual period 08/22/2013, SpO2 99.00%.  Physical Exam  Constitutional: She is oriented to person, place, and time. She appears well-developed and well-nourished.  Cardiovascular: Normal rate, regular rhythm and normal heart sounds.   Respiratory: Effort normal and breath sounds normal.  Pt report pain is worse with palpation directly over lower sternum.    GI: Soft.  Pt seems to wince in discomfort as FHT and toco monitors are adjusted.  Neurological: She is alert and oriented to person, place, and time.  Skin: Skin is warm and dry.  Psychiatric: She has a normal mood and affect. Her behavior is normal.   FHT: Appropriate for GA UCs: None  ECG - Normal sinus rhythm  ED Course  IUP at [redacted]w[redacted]d Sternal pain  ECG GI cocktail CBC with diff  C/w Dr. Cletis Media Discharge home with precautions F/u in 1 week at [place], [facility], MSN 02/09/2014 12:26 PM

## 2014-02-09 NOTE — Discharge Instructions (Signed)
Heartburn During Pregnancy   Heartburn is a burning sensation in the chest caused by stomach acid backing up into the esophagus. Heartburn is common in pregnancy because a certain hormone (progesterone) is released when a woman is pregnant. The progesterone hormone may relax the valve that separates the esophagus from the stomach. This allows acid to go up into the esophagus, causing heartburn. Heartburn may also happen in pregnancy because the enlarging uterus pushes up on the stomach, which pushes more acid into the esophagus. This is especially true in the later stages of pregnancy. Heartburn problems usually go away after giving birth.  CAUSES   Heartburn is caused by stomach acid backing up into the esophagus. During pregnancy, this may result from various things, including:   · The progesterone hormone.  · Changing hormone levels.  · The growing uterus pushing stomach acid upward.  · Large meals.  · Certain foods and drinks.  · Exercise.  · Increased acid production.  SIGNS AND SYMPTOMS   · Burning pain in the chest or lower throat.  · Bitter taste in the mouth.  · Coughing.  DIAGNOSIS   Your health care provider will typically diagnose heartburn by taking a careful history of your concern. Blood tests may be done to check for a certain type of bacteria that is associated with heartburn. Sometimes, heartburn is diagnosed by prescribing a heartburn medicine to see if the symptoms improve. In some cases, a procedure called an endoscopy may be done. In this procedure, a tube with a light and a camera on the end (endoscope) is used to examine the esophagus and the stomach.  TREATMENT   Treatment will vary depending on the severity of your symptoms. Your health care provider may recommend:  · Over-the-counter medicines (antacids, acid reducers) for mild heartburn.  · Prescription medicines to decrease stomach acid or to protect your stomach lining.  · Certain changes in your diet.  · Elevating the head of your bed  by putting blocks under the legs. This helps prevent stomach acid from backing up into the esophagus when you are lying down.  HOME CARE INSTRUCTIONS   · Only take over-the-counter or prescription medicines as directed by your health care provider.  · Raise the head of your bed by putting blocks under the legs if instructed to do so by your health care provider. Sleeping with more pillows is not effective because it only changes the position of your head.  · Do not exercise right after eating.  · Avoid eating 2 3 hours before bed. Do not lie down right after eating.  · Eat small meals throughout the day instead of three large meals.  · Identify foods and beverages that make your symptoms worse and avoid them. Foods you may want to avoid include:  · Peppers.  · Chocolate.  · High-fat foods, including fried foods.  · Spicy foods.  · Garlic and onions.  · Citrus fruits, including oranges, grapefruit, lemons, and limes.  · Food containing tomatoes or tomato products.  · Mint.  · Carbonated and caffeinated drinks.  · Vinegar.  SEEK MEDICAL CARE IF:  · You have abdominal pain of any kind.  · You feel burning in your upper abdomen or chest, especially after eating or lying down.  · You have nausea and vomiting.  · Your stomach feels upset after you eat.  SEEK IMMEDIATE MEDICAL CARE IF:   · You have severe chest pain that goes down your arm or into your   jaw or neck.  · You feel sweaty, dizzy, or lightheaded.  · You become short of breath.  · You vomit blood.  · You have difficulty or pain with swallowing.  · You have bloody or black, tarry stools.  · You have episodes of heartburn more than 3 times a week, for more than 2 weeks.  MAKE SURE YOU:  · Understand these instructions.  · Will watch your condition.  · Will get help right away if you are not doing well or get worse.  Document Released: 11/27/2000 Document Revised: 09/20/2013 Document Reviewed: 07/19/2013  [employer]® Patient Information ©2014 [employer].

## 2014-02-09 NOTE — MAU Note (Signed)
Patient states that she has been having upper mid chest tightness, worse at night, since 2-21. States her left arm got numb last night, not today. Denies contractions, bleeding or leaking. Reports feeling fetal movement. Left hip hurts when walking.

## 2014-03-08 ENCOUNTER — Inpatient Hospital Stay (HOSPITAL_COMMUNITY)
Admission: AD | Admit: 2014-03-08 | Discharge: 2014-03-09 | Disposition: A | Discharge status: Home / Self Care | Payer: Medicaid Other | Source: Ambulatory Visit | Attending: Obstetrics and Gynecology | Admitting: Obstetrics and Gynecology

## 2014-03-08 ENCOUNTER — Encounter (HOSPITAL_COMMUNITY): Payer: Self-pay | Admitting: *Deleted

## 2014-03-08 DIAGNOSIS — O9989 Other specified diseases and conditions complicating pregnancy, childbirth and the puerperium: Principal | ICD-10-CM

## 2014-03-08 DIAGNOSIS — R030 Elevated blood-pressure reading, without diagnosis of hypertension: Secondary | ICD-10-CM | POA: Insufficient documentation

## 2014-03-08 DIAGNOSIS — R1011 Right upper quadrant pain: Secondary | ICD-10-CM | POA: Insufficient documentation

## 2014-03-08 DIAGNOSIS — M549 Dorsalgia, unspecified: Secondary | ICD-10-CM | POA: Insufficient documentation

## 2014-03-08 DIAGNOSIS — O99891 Other specified diseases and conditions complicating pregnancy: Secondary | ICD-10-CM | POA: Insufficient documentation

## 2014-03-08 LAB — URINALYSIS, ROUTINE W REFLEX MICROSCOPIC
BILIRUBIN URINE: NEGATIVE
Glucose, UA: NEGATIVE mg/dL
Hgb urine dipstick: NEGATIVE
Ketones, ur: NEGATIVE mg/dL
Leukocytes, UA: NEGATIVE
Nitrite: NEGATIVE
Protein, ur: NEGATIVE mg/dL
UROBILINOGEN UA: 1 mg/dL (ref 0.0–1.0)
pH: 6 (ref 5.0–8.0)

## 2014-03-08 LAB — COMPREHENSIVE METABOLIC PANEL
ALK PHOS: 121 U/L — AB (ref 39–117)
ALT: 8 U/L (ref 0–35)
AST: 10 U/L (ref 0–37)
Albumin: 2.5 g/dL — ABNORMAL LOW (ref 3.5–5.2)
BILIRUBIN TOTAL: 0.2 mg/dL — AB (ref 0.3–1.2)
BUN: 6 mg/dL (ref 6–23)
CO2: 22 meq/L (ref 19–32)
Calcium: 8.2 mg/dL — ABNORMAL LOW (ref 8.4–10.5)
Chloride: 102 mEq/L (ref 96–112)
Creatinine, Ser: 0.39 mg/dL — ABNORMAL LOW (ref 0.50–1.10)
GLUCOSE: 77 mg/dL (ref 70–99)
POTASSIUM: 3.8 meq/L (ref 3.7–5.3)
Sodium: 138 mEq/L (ref 137–147)
Total Protein: 6.2 g/dL (ref 6.0–8.3)

## 2014-03-08 LAB — CBC WITH DIFFERENTIAL/PLATELET
Basophils Absolute: 0 10*3/uL (ref 0.0–0.1)
Basophils Relative: 0 % (ref 0–1)
Eosinophils Absolute: 0.1 10*3/uL (ref 0.0–0.7)
Eosinophils Relative: 1 % (ref 0–5)
HCT: 30.8 % — ABNORMAL LOW (ref 36.0–46.0)
HEMOGLOBIN: 10.1 g/dL — AB (ref 12.0–15.0)
LYMPHS ABS: 2.2 10*3/uL (ref 0.7–4.0)
LYMPHS PCT: 18 % (ref 12–46)
MCH: 27.7 pg (ref 26.0–34.0)
MCHC: 32.8 g/dL (ref 30.0–36.0)
MCV: 84.4 fL (ref 78.0–100.0)
MONOS PCT: 10 % (ref 3–12)
Monocytes Absolute: 1.2 10*3/uL — ABNORMAL HIGH (ref 0.1–1.0)
Neutro Abs: 8.9 10*3/uL — ABNORMAL HIGH (ref 1.7–7.7)
Neutrophils Relative %: 72 % (ref 43–77)
PLATELETS: 264 10*3/uL (ref 150–400)
RBC: 3.65 MIL/uL — AB (ref 3.87–5.11)
RDW: 13.8 % (ref 11.5–15.5)
WBC: 12.4 10*3/uL — AB (ref 4.0–10.5)

## 2014-03-08 LAB — URIC ACID: Uric Acid, Serum: 3.1 mg/dL (ref 2.4–7.0)

## 2014-03-08 LAB — LACTATE DEHYDROGENASE: LDH: 154 U/L (ref 94–250)

## 2014-03-08 LAB — PROTEIN / CREATININE RATIO, URINE
CREATININE, URINE: 198.76 mg/dL
PROTEIN CREATININE RATIO: 0.09 (ref 0.00–0.15)
TOTAL PROTEIN, URINE: 18.7 mg/dL

## 2014-03-08 MED ORDER — HYDROCODONE-ACETAMINOPHEN 5-325 MG PO TABS
1.0000 | ORAL_TABLET | Freq: Four times a day (QID) | ORAL | Status: DC | PRN
Start: 2014-03-08 — End: 2014-04-03

## 2014-03-08 MED ORDER — CYCLOBENZAPRINE HCL 10 MG PO TABS: 10.0000 mg | ORAL_TABLET | Freq: Three times a day (TID) | ORAL | Status: DC | PRN

## 2014-03-08 MED ORDER — LACTATED RINGERS IV BOLUS (SEPSIS)
500.0000 mL | Freq: Once | INTRAVENOUS | Status: AC
Start: 2014-03-08 — End: 2014-03-08
  Administered 2014-03-08: 1000 mL via INTRAVENOUS

## 2014-03-08 MED ORDER — BUTORPHANOL TARTRATE 1 MG/ML IJ SOLN
1.0000 mg | Freq: Once | INTRAMUSCULAR | Status: AC
  Administered 2014-03-08: 1 mg via INTRAVENOUS
  Filled 2014-03-08: qty 1

## 2014-03-08 MED ORDER — LACTATED RINGERS IV SOLN: INTRAVENOUS | Status: DC

## 2014-03-08 NOTE — MAU Provider Note (Signed)
History   23 yo [id number] at 28 2/7 weeks presented unannounced c/o RUQ and right flank/back pain x 1 week, with worsening today.  Reports occasional mild nausea, but no vomiting.  Normal appetite, except when pain worsens.  Denies dysuria, fever, discharge, bleeding, cramping, leaking--reports +FM.  Denies reflux sx.  Seen at [facility] on 3/24.  Has had low back pain throughout pregnancy, but this pain tonight is upper back. She was measuring S>D at that visit and is scheduled for [place] at [place].  Also c/o right ear and sinus pain, thinks has ear/sinus infection.  Had cholecystectomy 2014.  Patient Active Problem List   Diagnosis Date Noted  . Second hand tobacco smoke exposure 03/09/2013  . S/P laparoscopic cholecystectomy 02/06/2013  . Hx of preeclampsia, prior pregnancy, currently pregnant 01/09/2013  . History of congestive heart failure 01/09/2013  . Mild persistent asthma 11/18/2012  CHF related to pre-eclampsia after 2011 delivery   Chief Complaint  Patient presents with  . Back Pain   HPI:  See above  OB History   Grav Para Term Preterm Abortions TAB SAB Ect Mult Living   4 2 1 1 1  1   2      Obstetric Comments   2011 PIH;IOL; PP CHF-HOSPITALIZED X 1 WEEK      Past Medical History  Diagnosis Date  . Eczema   . CHF (congestive heart failure)     RESOLVED; HAD CARDIAC EVAL   . Congestive heart failure 2011    related to preeclampsia  . Hyperthyroidism AGE 11    NO TX  . Hypothyroidism AGE 59     NO TX  . Pregnancy induced hypertension 2011  . Preterm labor 2011  . Infection     UTI OCC  . Infection 2013    [place]  . Headache(784.0) 2011    MIGRAQINES; OTC  . Asthma 2009; CORNERSTONE    INHALER PRN    Past Surgical History  Procedure Laterality Date  . Tonsillectomy    . Wisdom tooth extraction    . Tear duct probing    . Cholecystectomy      Family History  Problem Relation Age of Onset  . Anesthesia problems Neg Hx   . Other Neg Hx   . Asthma Brother   .  Learning disabilities Brother   . Asthma Daughter   . Learning disabilities Daughter   . Seizures Daughter   . Learning disabilities Other   . Colon cancer Maternal Grandmother   . Cancer Maternal Grandmother     colon    History  Substance Use Topics  . Smoking status: Never Smoker   . Smokeless tobacco: Never Used  . Alcohol Use: No    Allergies: No Known Allergies  Prescriptions prior to admission  Medication Sig Dispense Refill  . acetaminophen (TYLENOL) 325 MG tablet Take 650 mg by mouth every 6 (six) hours as needed for headache.      . Prenatal Vit-Fe Fumarate-FA (PRENATAL MULTIVITAMIN) TABS tablet Take 1 tablet by mouth daily at 12 noon.      Marland Kitchen albuterol (PROVENTIL HFA;VENTOLIN HFA) 108 (90 BASE) MCG/ACT inhaler Inhale 2 puffs into the lungs every 6 (six) hours as needed for wheezing.  1 Inhaler  4  . budesonide (PULMICORT) 180 MCG/ACT inhaler Inhale 2 puffs into the lungs 2 (two) times daily.  1 Inhaler  6  . pantoprazole (PROTONIX) 40 MG tablet Take 1 tablet (40 mg total) by mouth daily.  30 tablet  11  ROS:  RUQ and right flank/back pain, +FM Physical Exam   Blood pressure 134/97, pulse 106, temperature 99.1 F (37.3 C), temperature source Oral, resp. rate 18, height 5\' 1"  (1.549 m), weight 193 lb 3.2 oz (87.635 kg), last menstrual period 08/22/2013, SpO2 100.00%, not currently breastfeeding.  Physical Exam Ears Throat Chest clear Heart RRR without murmur Abd--gravid, FH 31 cm, tenderness in RUQ to palpation, guarding/rebound noted.  +bowel sounds. Back--+CVAT on right, negative on left. Pelvic--deferred, no contractions. Ext DTR 2+, no clonus, tr edema  FHR Category1 UCs none   ED Course  IUP at 28 2/7 weeks RUQ, right flank/back pain Elevated BP--hx PIH with 2011 pregnancy, with CHF pp.  Plan: IV hydration CBC, diff, CMP, LDH, uric acid, PCR Stadol for pain Will consult when labs returned   Donnel Saxon CNM, [place] 03/08/2014 9:34  PM  Addendum: Pain now resolved.  No N/V, dysuria, or any other sx.  Filed Vitals:   03/08/14 1943 03/08/14 2019 03/08/14 2221  BP: 134/97  108/72  Pulse: 126 106 92  Temp: 99.1 F (37.3 C)    TempSrc: Oral    Resp: 18    Height: 5\' 1"  (1.549 m)    Weight: 193 lb 3.2 oz (87.635 kg)    SpO2: 100%       Results for orders placed during the hospital encounter of 03/08/14 (from the past 24 hour(s))  URINALYSIS, ROUTINE W REFLEX MICROSCOPIC     Status: Abnormal   Collection Time    03/08/14  7:48 PM      Result Value Ref Range   Color, Urine YELLOW  YELLOW   APPearance CLEAR  CLEAR   Specific Gravity, Urine >1.030 (*) 1.005 - 1.030   pH 6.0  5.0 - 8.0   Glucose, UA NEGATIVE  NEGATIVE mg/dL   Hgb urine dipstick NEGATIVE  NEGATIVE   Bilirubin Urine NEGATIVE  NEGATIVE   Ketones, ur NEGATIVE  NEGATIVE mg/dL   Protein, ur NEGATIVE  NEGATIVE mg/dL   Urobilinogen, UA 1.0  0.0 - 1.0 mg/dL   Nitrite NEGATIVE  NEGATIVE   Leukocytes, UA NEGATIVE  NEGATIVE  PROTEIN / CREATININE RATIO, URINE     Status: None   Collection Time    03/08/14  7:48 PM      Result Value Ref Range   Creatinine, Urine 198.76     Total Protein, Urine 18.7     PROTEIN CREATININE RATIO 0.09  0.00 - 0.15  CBC WITH DIFFERENTIAL     Status: Abnormal   Collection Time    03/08/14  9:50 PM      Result Value Ref Range   WBC 12.4 (*) 4.0 - 10.5 K/uL   RBC 3.65 (*) 3.87 - 5.11 MIL/uL   Hemoglobin 10.1 (*) 12.0 - 15.0 g/dL   HCT 30.8 (*) 36.0 - 46.0 %   MCV 84.4  78.0 - 100.0 fL   MCH 27.7  26.0 - 34.0 pg   MCHC 32.8  30.0 - 36.0 g/dL   RDW 13.8  11.5 - 15.5 %   Platelets 264  150 - 400 K/uL   Neutrophils Relative % 72  43 - 77 %   Neutro Abs 8.9 (*) 1.7 - 7.7 K/uL   Lymphocytes Relative 18  12 - 46 %   Lymphs Abs 2.2  0.7 - 4.0 K/uL   Monocytes Relative 10  3 - 12 %   Monocytes Absolute 1.2 (*) 0.1 - 1.0 K/uL   Eosinophils Relative 1  0 - 5 %   Eosinophils Absolute 0.1  0.0 - 0.7 K/uL   Basophils Relative  0  0 - 1 %   Basophils Absolute 0.0  0.0 - 0.1 K/uL  COMPREHENSIVE METABOLIC PANEL     Status: Abnormal   Collection Time    03/08/14  9:50 PM      Result Value Ref Range   Sodium 138  137 - 147 mEq/L   Potassium 3.8  3.7 - 5.3 mEq/L   Chloride 102  96 - 112 mEq/L   CO2 22  19 - 32 mEq/L   Glucose, Bld 77  70 - 99 mg/dL   BUN 6  6 - 23 mg/dL   Creatinine, Ser 0.39 (*) 0.50 - 1.10 mg/dL   Calcium 8.2 (*) 8.4 - 10.5 mg/dL   Total Protein 6.2  6.0 - 8.3 g/dL   Albumin 2.5 (*) 3.5 - 5.2 g/dL   AST 10  0 - 37 U/L   ALT 8  0 - 35 U/L   Alkaline Phosphatase 121 (*) 39 - 117 U/L   Total Bilirubin 0.2 (*) 0.3 - 1.2 mg/dL   GFR calc non Af Amer >90  >90 mL/min   GFR calc Af Amer >90  >90 mL/min  LACTATE DEHYDROGENASE     Status: None   Collection Time    03/08/14  9:50 PM      Result Value Ref Range   LDH 154  94 - 250 U/L  URIC ACID     Status: None   Collection Time    03/08/14  9:50 PM      Result Value Ref Range   Uric Acid, Serum 3.1  2.4 - 7.0 mg/dL   Consulted with Dr. Cletis Media prior to lab results. Will d/c home with Flexeril, Vicodin (patient has been taking Ibuprophen without benefit). Keep scheduled appt at [place] in 2 weeks, for ROB and [place]. Call prn.  Donnel Saxon, CNM 03/09/14 0005

## 2014-03-08 NOTE — MAU Note (Signed)
Constant low back pain x 1 week, worsening. RUQ pain, burning and constant. Right ear pain since Sunday night, thinks has ear infection. Denies fever. Sinus pain since Sunday, thinks has sinus infection. Positive fetal movement. Denies vaginal bleeding/LOF/contractions/vaginal discharge. Denies n/v/d.

## 2014-03-09 NOTE — Discharge Instructions (Signed)
Take Flexeril three times a day as needed for pain. Take Vicodin every 4-6 hours as needed for pain--take Tylenol at least 4-6 hours after Vicodin (Vicodin also has Tylenol in it). May use heat to back and upper abdomen (heating pad on low for 30 min at at time).

## 2014-03-19 ENCOUNTER — Encounter (HOSPITAL_COMMUNITY): Payer: Self-pay | Admitting: *Deleted

## 2014-03-19 ENCOUNTER — Inpatient Hospital Stay (HOSPITAL_COMMUNITY)
Admission: AD | Admit: 2014-03-19 | Discharge: 2014-03-19 | Disposition: A | Discharge status: Home / Self Care | Payer: Medicaid Other | Source: Ambulatory Visit | Attending: Obstetrics and Gynecology | Admitting: Obstetrics and Gynecology

## 2014-03-19 DIAGNOSIS — J00 Acute nasopharyngitis [common cold]: Secondary | ICD-10-CM | POA: Insufficient documentation

## 2014-03-19 DIAGNOSIS — J45909 Unspecified asthma, uncomplicated: Secondary | ICD-10-CM | POA: Insufficient documentation

## 2014-03-19 DIAGNOSIS — R51 Headache: Secondary | ICD-10-CM | POA: Insufficient documentation

## 2014-03-19 DIAGNOSIS — O99891 Other specified diseases and conditions complicating pregnancy: Secondary | ICD-10-CM | POA: Insufficient documentation

## 2014-03-19 DIAGNOSIS — R059 Cough, unspecified: Secondary | ICD-10-CM | POA: Insufficient documentation

## 2014-03-19 DIAGNOSIS — O9989 Other specified diseases and conditions complicating pregnancy, childbirth and the puerperium: Principal | ICD-10-CM

## 2014-03-19 DIAGNOSIS — R05 Cough: Secondary | ICD-10-CM | POA: Insufficient documentation

## 2014-03-19 LAB — CBC WITH DIFFERENTIAL/PLATELET
BASOS ABS: 0 10*3/uL (ref 0.0–0.1)
BASOS PCT: 0 % (ref 0–1)
Eosinophils Absolute: 0.1 10*3/uL (ref 0.0–0.7)
Eosinophils Relative: 1 % (ref 0–5)
HCT: 32.4 % — ABNORMAL LOW (ref 36.0–46.0)
Hemoglobin: 10.5 g/dL — ABNORMAL LOW (ref 12.0–15.0)
Lymphocytes Relative: 16 % (ref 12–46)
Lymphs Abs: 1.8 10*3/uL (ref 0.7–4.0)
MCH: 27 pg (ref 26.0–34.0)
MCHC: 32.4 g/dL (ref 30.0–36.0)
MCV: 83.3 fL (ref 78.0–100.0)
Monocytes Absolute: 0.8 10*3/uL (ref 0.1–1.0)
Monocytes Relative: 7 % (ref 3–12)
NEUTROS ABS: 8.6 10*3/uL — AB (ref 1.7–7.7)
NEUTROS PCT: 76 % (ref 43–77)
PLATELETS: 232 10*3/uL (ref 150–400)
RBC: 3.89 MIL/uL (ref 3.87–5.11)
RDW: 14 % (ref 11.5–15.5)
WBC: 11.4 10*3/uL — ABNORMAL HIGH (ref 4.0–10.5)

## 2014-03-19 MED ORDER — ALBUTEROL SULFATE (2.5 MG/3ML) 0.083% IN NEBU
2.5000 mg | INHALATION_SOLUTION | Freq: Once | RESPIRATORY_TRACT | Status: AC
  Administered 2014-03-19: 2.5 mg via RESPIRATORY_TRACT
  Filled 2014-03-19: qty 3

## 2014-03-19 MED ORDER — HYDROCOD POLST-CHLORPHEN POLST 10-8 MG/5ML PO LQCR
5.0000 mL | Freq: Once | ORAL | Status: AC
  Administered 2014-03-19: 5 mL via ORAL
  Filled 2014-03-19: qty 5

## 2014-03-19 MED ORDER — ACETAMINOPHEN 325 MG PO TABS
650.0000 mg | ORAL_TABLET | Freq: Once | ORAL | Status: AC
  Administered 2014-03-19: 650 mg via ORAL
  Filled 2014-03-19: qty 2

## 2014-03-19 NOTE — MAU Provider Note (Signed)
History   23y.o. [id number] at 29.6wks presents with complaint of non productive cough x 7 days and HA since yesterday.  Patient reports attempting to take OTC robitussin, sudafed, and her albuterol inhaler with no relief.  Patient denies trying to treat her HA.  Patient reports active fetus and denies LOF & VB.  Patient denies fever, chills, and GI distress.  Patient reports, to Dr. Octavio Manns, that family member was recently diagnosed with pneumonia.  However, no one else has been sick.   Patient Active Problem List   Diagnosis Date Noted  . Second hand tobacco smoke exposure 03/09/2013  . S/P laparoscopic cholecystectomy 02/06/2013  . Hx of preeclampsia, prior pregnancy, currently pregnant 01/09/2013  . History of congestive heart failure 01/09/2013  . Mild persistent asthma 11/18/2012    Chief Complaint  Patient presents with  . Cough   HPI  OB History   Grav Para Term Preterm Abortions TAB SAB Ect Mult Living   4 2 1 1 1  1   2      Obstetric Comments   2011 PIH;IOL; PP CHF-HOSPITALIZED X 1 WEEK      Past Medical History  Diagnosis Date  . Eczema   . CHF (congestive heart failure)     RESOLVED; HAD CARDIAC EVAL   . Congestive heart failure 2011    related to preeclampsia  . Hyperthyroidism AGE 63    NO TX  . Hypothyroidism AGE 48     NO TX  . Pregnancy induced hypertension 2011  . Preterm labor 2011  . Infection     UTI OCC  . Infection 2013    [place]  . Headache(784.0) 2011    MIGRAQINES; OTC  . Asthma 2009; CORNERSTONE    INHALER PRN  . Vaginal Pap smear, abnormal     colpo    Past Surgical History  Procedure Laterality Date  . Tonsillectomy    . Wisdom tooth extraction    . Tear duct probing    . Cholecystectomy      Family History  Problem Relation Age of Onset  . Anesthesia problems Neg Hx   . Other Neg Hx   . Asthma Brother   . Learning disabilities Brother   . Asthma Daughter   . Learning disabilities Daughter   . Seizures Daughter   .  Learning disabilities Other   . Colon cancer Maternal Grandmother   . Cancer Maternal Grandmother     colon    History  Substance Use Topics  . Smoking status: Never Smoker   . Smokeless tobacco: Never Used  . Alcohol Use: No    Allergies: No Known Allergies  Prescriptions prior to admission  Medication Sig Dispense Refill  . acetaminophen (TYLENOL) 325 MG tablet Take 650 mg by mouth every 6 (six) hours as needed for headache.      . albuterol (PROVENTIL HFA;VENTOLIN HFA) 108 (90 BASE) MCG/ACT inhaler Inhale 2 puffs into the lungs every 6 (six) hours as needed for wheezing.  1 Inhaler  4  . budesonide (PULMICORT) 180 MCG/ACT inhaler Inhale 2 puffs into the lungs 2 (two) times daily.  1 Inhaler  6  . cyclobenzaprine (FLEXERIL) 10 MG tablet Take 1 tablet (10 mg total) by mouth 3 (three) times daily as needed for muscle spasms.  30 tablet  0  . HYDROcodone-acetaminophen (NORCO/VICODIN) 5-325 MG per tablet Take 1 tablet by mouth every 6 (six) hours as needed for moderate pain.  30 tablet  0  . pantoprazole (  PROTONIX) 40 MG tablet Take 1 tablet (40 mg total) by mouth daily.  30 tablet  11  . Prenatal Vit-Fe Fumarate-FA (PRENATAL MULTIVITAMIN) TABS tablet Take 1 tablet by mouth daily at 12 noon.        Review of Systems  Constitutional: Negative.   HENT: Positive for congestion. Negative for sore throat.   Eyes: Negative.   Respiratory: Positive for cough. Negative for hemoptysis, sputum production, shortness of breath and wheezing.   Cardiovascular: Negative.   Gastrointestinal: Negative.   Musculoskeletal: Positive for myalgias.  Skin: Negative.   Neurological: Positive for headaches.   Physical Exam   Blood pressure 126/89, pulse 110, temperature 98.3 F (36.8 C), temperature source Oral, resp. rate 20, height 5' (1.524 m), weight 192 lb 3.2 oz (87.181 kg), last menstrual period 08/22/2013, SpO2 99.00%, not currently breastfeeding.  Physical Exam  Cardiovascular: Normal rate,  regular rhythm and normal heart sounds.   Respiratory: Effort normal. She has wheezes in the right lower field and the left lower field.  Wheezing also heard over bronchi.  GI: Soft. Bowel sounds are normal.  Musculoskeletal: Normal range of motion.  Neurological: She is alert.  Skin: Skin is warm and dry.  Psychiatric: She has a normal mood and affect.    ED Course  Assessment: IUP at 29.6wks Asthma Common Cold HA NST-Reactive  Plan: Labs: CBC with Differential-No significant findings Nebulizer treatment with albuterol One dose Tussionex   Tylenol 650mg    1200 Treatments complete-reports feeling better Dr. Octavio Manns in to assess patient-RX for z-pack Keep office appt as scheduled-03/20/14 Work excuse given Discharge to home with increased rest and hydration. Call if you have any questions or concerns prior to your next visit.   Brodee Mauritz LYNN CNM, MSN 03/19/2014 10:50 AM

## 2014-03-19 NOTE — Discharge Instructions (Signed)

## 2014-03-19 NOTE — MAU Note (Signed)
Nn-productive cough for past week.  Congested in head, denies fever.

## 2014-03-19 NOTE — MAU Note (Signed)
Patient states she has had a cough for about one week. Has tried OTC medications without relief. Causes her head to hurt with coughing. Denies contractions, bleeding or leaking and reports good fetal movement.

## 2014-04-03 ENCOUNTER — Encounter (HOSPITAL_COMMUNITY): Payer: Self-pay | Admitting: *Deleted

## 2014-04-03 ENCOUNTER — Inpatient Hospital Stay (HOSPITAL_COMMUNITY)
Admission: AD | Admit: 2014-04-03 | Discharge: 2014-04-03 | Disposition: A | Discharge status: Home / Self Care | Payer: Medicaid Other | Source: Ambulatory Visit | Attending: Obstetrics and Gynecology | Admitting: Obstetrics and Gynecology

## 2014-04-03 DIAGNOSIS — N949 Unspecified condition associated with female genital organs and menstrual cycle: Secondary | ICD-10-CM | POA: Insufficient documentation

## 2014-04-03 DIAGNOSIS — O2390 Unspecified genitourinary tract infection in pregnancy, unspecified trimester: Secondary | ICD-10-CM | POA: Insufficient documentation

## 2014-04-03 DIAGNOSIS — N390 Urinary tract infection, site not specified: Secondary | ICD-10-CM | POA: Insufficient documentation

## 2014-04-03 DIAGNOSIS — R109 Unspecified abdominal pain: Secondary | ICD-10-CM | POA: Insufficient documentation

## 2014-04-03 LAB — URINALYSIS, ROUTINE W REFLEX MICROSCOPIC
BILIRUBIN URINE: NEGATIVE
Glucose, UA: 100 mg/dL — AB
Ketones, ur: 15 mg/dL — AB
Nitrite: NEGATIVE
PROTEIN: NEGATIVE mg/dL
SPECIFIC GRAVITY, URINE: 1.025 (ref 1.005–1.030)
UROBILINOGEN UA: 0.2 mg/dL (ref 0.0–1.0)
pH: 6.5 (ref 5.0–8.0)

## 2014-04-03 LAB — URINE MICROSCOPIC-ADD ON

## 2014-04-03 MED ORDER — ACETAMINOPHEN 325 MG PO TABS
650.0000 mg | ORAL_TABLET | Freq: Four times a day (QID) | ORAL | Status: DC | PRN
  Administered 2014-04-03: 650 mg via ORAL
  Filled 2014-04-03: qty 2

## 2014-04-03 NOTE — MAU Note (Signed)
Increased pelvic pressure since 0300, unable to sleep.  uc's started @ 0600, denies bleeding or LOF.

## 2014-04-03 NOTE — MAU Provider Note (Signed)
History   Patient is a 23y.o. [id number] at 32wks who presents with complaint of pelvic pressure since 0300.  Patient reports increased pressure started after sexual intercourse last night and has gotten progressively worse.  Patient states that prior to arriving to hospital, she started to have abdominal pain and cramping.  Patient reports active fetus and denies LoF and VB.  Patient states that she had an appt today at 1300, but felt pain was too significant to wait.  Patient denies pain or hesitancy with urination, but does report increase in frequency.  Patient also states that lately her appetite has been decreased and she is not consuming adequate nutrition and hydration.    Patient Active Problem List   Diagnosis Date Noted  . Second hand tobacco smoke exposure 03/09/2013  . S/P laparoscopic cholecystectomy 02/06/2013  . Hx of preeclampsia, prior pregnancy, currently pregnant 01/09/2013  . History of congestive heart failure 01/09/2013  . Mild persistent asthma 11/18/2012    Chief Complaint  Patient presents with  . pelvic pressure   . Abdominal Pain   HPI  OB History   Grav Para Term Preterm Abortions TAB SAB Ect Mult Living   4 2 1 1 1  1   2      Obstetric Comments   2011 PIH;IOL; PP CHF-HOSPITALIZED X 1 WEEK      Past Medical History  Diagnosis Date  . Eczema   . CHF (congestive heart failure)     RESOLVED; HAD CARDIAC EVAL   . Congestive heart failure 2011    related to preeclampsia  . Hyperthyroidism AGE 5    NO TX  . Hypothyroidism AGE 85     NO TX  . Pregnancy induced hypertension 2011  . Preterm labor 2011  . Infection     UTI OCC  . Infection 2013    [place]  . Headache(784.0) 2011    MIGRAQINES; OTC  . Asthma 2009; CORNERSTONE    INHALER PRN  . Vaginal Pap smear, abnormal     colpo    Past Surgical History  Procedure Laterality Date  . Tonsillectomy    . Wisdom tooth extraction    . Tear duct probing    . Cholecystectomy      Family History   Problem Relation Age of Onset  . Anesthesia problems Neg Hx   . Other Neg Hx   . Asthma Brother   . Learning disabilities Brother   . Asthma Daughter   . Learning disabilities Daughter   . Seizures Daughter   . Learning disabilities Other   . Colon cancer Maternal Grandmother   . Cancer Maternal Grandmother     colon    History  Substance Use Topics  . Smoking status: Never Smoker   . Smokeless tobacco: Never Used  . Alcohol Use: No    Allergies: No Known Allergies  Prescriptions prior to admission  Medication Sig Dispense Refill  . acetaminophen (TYLENOL) 325 MG tablet Take 650 mg by mouth every 6 (six) hours as needed for headache.      . albuterol (PROVENTIL HFA;VENTOLIN HFA) 108 (90 BASE) MCG/ACT inhaler Inhale 2 puffs into the lungs every 6 (six) hours as needed for wheezing.  1 Inhaler  4  . guaiFENesin (ROBITUSSIN) 100 MG/5ML liquid Take 100 mg by mouth every 6 (six) hours as needed for cough or congestion.      . pantoprazole (PROTONIX) 40 MG tablet Take 1 tablet (40 mg total) by mouth daily.  30 tablet  11  . Prenatal Vit-Fe Fumarate-FA (PRENATAL MULTIVITAMIN) TABS tablet Take 1 tablet by mouth daily at 12 noon.        ROS See HPI Above Physical Exam   Blood pressure 132/75, pulse 115, temperature 97.9 F (36.6 C), temperature source Oral, resp. rate 18, last menstrual period 08/22/2013, not currently breastfeeding.  Physical Exam FHR: 135 bpm, Mod Var, -Decels, +Accels UC: None noted Abdomen: Gravid, soft NT at fundus Vaginal exam: Cl/50/-2 Results for orders placed during the hospital encounter of 04/03/14 (from the past 24 hour(s))  URINALYSIS, ROUTINE W REFLEX MICROSCOPIC     Status: Abnormal   Collection Time    04/03/14  8:35 AM      Result Value Ref Range   Color, Urine YELLOW  YELLOW   APPearance CLEAR  CLEAR   Specific Gravity, Urine 1.025  1.005 - 1.030   pH 6.5  5.0 - 8.0   Glucose, UA 100 (*) NEGATIVE mg/dL   Hgb urine dipstick LARGE (*)  NEGATIVE   Bilirubin Urine NEGATIVE  NEGATIVE   Ketones, ur 15 (*) NEGATIVE mg/dL   Protein, ur NEGATIVE  NEGATIVE mg/dL   Urobilinogen, UA 0.2  0.0 - 1.0 mg/dL   Nitrite NEGATIVE  NEGATIVE   Leukocytes, UA SMALL (*) NEGATIVE  URINE MICROSCOPIC-ADD ON     Status: None   Collection Time    04/03/14  8:35 AM      Result Value Ref Range   Squamous Epithelial / LPF RARE  RARE   WBC, UA 3-6  <3 WBC/hpf   RBC / HPF 21-50  <3 RBC/hpf   Bacteria, UA RARE  RARE    ED Course  Assessment: IUP at 32wks Cat I FT Pelvic Pressure UTI  Plan: Macrobid one PO BID x 7days Tylenol now and prn at home Reschedule appt for 2 weeks, but keep lab appt for 3hr GTT Call if you have any questions or concerns prior to your next visit.   Gavin Pound CNM, MSN 04/03/2014 11:20 AM

## 2014-04-03 NOTE — Discharge Instructions (Signed)
Urinary Tract Infection °A urinary tract infection (UTI) can occur any place along the urinary tract. The tract includes the kidneys, ureters, bladder, and urethra. A type of germ called bacteria often causes a UTI. UTIs are often helped with antibiotic medicine.  °HOME CARE  °· If given, take antibiotics as told by your doctor. Finish them even if you start to feel better. °· Drink enough fluids to keep your pee (urine) clear or pale yellow. °· Avoid tea, drinks with caffeine, and bubbly (carbonated) drinks. °· Pee often. Avoid holding your pee in for a long time. °· Pee before and after having sex (intercourse). °· Wipe from front to back after you poop (bowel movement) if you are a woman. Use each tissue only once. °GET HELP RIGHT AWAY IF:  °· You have back pain. °· You have lower belly (abdominal) pain. °· You have chills. °· You feel sick to your stomach (nauseous). °· You throw up (vomit). °· Your burning or discomfort with peeing does not go away. °· You have a fever. °· Your symptoms are not better in 3 days. °MAKE SURE YOU:  °· Understand these instructions. °· Will watch your condition. °· Will get help right away if you are not doing well or get worse. °Document Released: 05/18/2008 Document Revised: 08/24/2012 Document Reviewed: 06/30/2012 °[employer]® Patient Information ©2014 [employer]. ° °

## 2014-04-03 NOTE — MAU Note (Signed)
Urine in lab 

## 2014-04-23 ENCOUNTER — Inpatient Hospital Stay (HOSPITAL_COMMUNITY): Payer: Medicaid Other

## 2014-04-23 ENCOUNTER — Encounter (HOSPITAL_COMMUNITY): Payer: Self-pay | Admitting: *Deleted

## 2014-04-23 ENCOUNTER — Inpatient Hospital Stay (HOSPITAL_COMMUNITY)
Admission: AD | Admit: 2014-04-23 | Discharge: 2014-04-23 | Disposition: A | Discharge status: Home / Self Care | Payer: Medicaid Other | Source: Ambulatory Visit | Attending: Obstetrics and Gynecology | Admitting: Obstetrics and Gynecology

## 2014-04-23 DIAGNOSIS — O99891 Other specified diseases and conditions complicating pregnancy: Secondary | ICD-10-CM | POA: Insufficient documentation

## 2014-04-23 DIAGNOSIS — O9989 Other specified diseases and conditions complicating pregnancy, childbirth and the puerperium: Principal | ICD-10-CM

## 2014-04-23 DIAGNOSIS — M545 Low back pain, unspecified: Secondary | ICD-10-CM | POA: Insufficient documentation

## 2014-04-23 DIAGNOSIS — O209 Hemorrhage in early pregnancy, unspecified: Secondary | ICD-10-CM | POA: Insufficient documentation

## 2014-04-23 DIAGNOSIS — M549 Dorsalgia, unspecified: Secondary | ICD-10-CM

## 2014-04-23 LAB — URINE MICROSCOPIC-ADD ON

## 2014-04-23 LAB — URINALYSIS, ROUTINE W REFLEX MICROSCOPIC
Bilirubin Urine: NEGATIVE
GLUCOSE, UA: NEGATIVE mg/dL
HGB URINE DIPSTICK: NEGATIVE
Ketones, ur: 15 mg/dL — AB
Nitrite: NEGATIVE
PH: 6 (ref 5.0–8.0)
Protein, ur: NEGATIVE mg/dL
Specific Gravity, Urine: >1.03 — ABNORMAL HIGH (ref 1.005–1.030)
Urobilinogen, UA: 1 mg/dL (ref 0.0–1.0)

## 2014-04-23 LAB — WET PREP, GENITAL
Clue Cells Wet Prep HPF POC: NONE SEEN
Trich, Wet Prep: NONE SEEN
Yeast Wet Prep HPF POC: NONE SEEN

## 2014-04-23 MED ORDER — CYCLOBENZAPRINE HCL 10 MG PO TABS
10.0000 mg | ORAL_TABLET | Freq: Three times a day (TID) | ORAL | Status: DC | PRN
  Administered 2014-04-23: 10 mg via ORAL
  Filled 2014-04-23: qty 1

## 2014-04-23 MED ORDER — CYCLOBENZAPRINE HCL 10 MG PO TABS: 10.0000 mg | ORAL_TABLET | Freq: Three times a day (TID) | ORAL | Status: DC | PRN

## 2014-04-23 NOTE — MAU Note (Signed)
Patient states she had a lot of bleeding on 5-7 from a "tear" from wiping. Has had off and on spotting since with back pain. Has a clear slimy discharge and back pain. Reports good fetal movement.

## 2014-04-23 NOTE — MAU Provider Note (Signed)
History  23 yo @ 34 6/7 wks presents to MAU w/ c/o vaginal bleeding x 1 wk and low back pain that started today at work. Was treated for a UTI 1 wk ago. Was seen in office on 04/16/14 for same complaint of VB. Pt. Reports she might have had "a tear", no documentation that this diagnosis was noted in [place]. Reports active fetus. Denies LOF, vaginal odor, fever, abdominal pain or N/V. Reports having intercourse on a regular basis.    Pt currently working in [employer].  Patient Active Problem List   Diagnosis Date Noted  . Second hand tobacco smoke exposure 03/09/2013  . S/P laparoscopic cholecystectomy 02/06/2013  . Hx of preeclampsia, prior pregnancy, currently pregnant 01/09/2013  . History of congestive heart failure 01/09/2013  . Mild persistent asthma 11/18/2012    Chief Complaint  Patient presents with  . Back Pain  . Vaginal Discharge   HPI  As above  OB History   Grav Para Term Preterm Abortions TAB SAB Ect Mult Living   4 2 1 1 1  1   2      Obstetric Comments   2011 PIH;IOL; PP CHF-HOSPITALIZED X 1 WEEK      Past Medical History  Diagnosis Date  . Eczema   . CHF (congestive heart failure)     RESOLVED; HAD CARDIAC EVAL   . Congestive heart failure 2011    related to preeclampsia  . Hyperthyroidism AGE 61    NO TX  . Hypothyroidism AGE 57     NO TX  . Pregnancy induced hypertension 2011  . Preterm labor 2011  . Infection     UTI OCC  . Infection 2013    [place]  . Headache(784.0) 2011    MIGRAQINES; OTC  . Asthma 2009; CORNERSTONE    INHALER PRN  . Vaginal Pap smear, abnormal     colpo    Past Surgical History  Procedure Laterality Date  . Tonsillectomy    . Wisdom tooth extraction    . Tear duct probing    . Cholecystectomy      Family History  Problem Relation Age of Onset  . Anesthesia problems Neg Hx   . Other Neg Hx   . Asthma Brother   . Learning disabilities Brother   . Asthma Daughter   . Learning disabilities Daughter   .  Seizures Daughter   . Learning disabilities Other   . Colon cancer Maternal Grandmother   . Cancer Maternal Grandmother     colon    History  Substance Use Topics  . Smoking status: Never Smoker   . Smokeless tobacco: Never Used  . Alcohol Use: No    Allergies: No Known Allergies  Prescriptions prior to admission  Medication Sig Dispense Refill  . acetaminophen (TYLENOL) 325 MG tablet Take 650 mg by mouth every 6 (six) hours as needed for headache.      . liver oil-zinc oxide (DESITIN) 40 % ointment Apply 1 application topically as needed for irritation (used on stomach area for irritated skin.).      Marland Kitchen Prenatal Vit-Fe Fumarate-FA (PRENATAL MULTIVITAMIN) TABS tablet Take 1 tablet by mouth daily at 12 noon.      Marland Kitchen albuterol (PROVENTIL HFA;VENTOLIN HFA) 108 (90 BASE) MCG/ACT inhaler Inhale 2 puffs into the lungs every 6 (six) hours as needed for wheezing.  1 Inhaler  4  . guaiFENesin (ROBITUSSIN) 100 MG/5ML liquid Take 100 mg by mouth every 6 (six) hours as needed for  cough or congestion.      . pantoprazole (PROTONIX) 40 MG tablet Take 1 tablet (40 mg total) by mouth daily.  30 tablet  11   Results for orders placed during the hospital encounter of 04/23/14 (from the past 24 hour(s))  URINALYSIS, ROUTINE W REFLEX MICROSCOPIC     Status: Abnormal   Collection Time    04/23/14  1:55 PM      Result Value Ref Range   Color, Urine YELLOW  YELLOW   APPearance HAZY (*) CLEAR   Specific Gravity, Urine >1.030 (*) 1.005 - 1.030   pH 6.0  5.0 - 8.0   Glucose, UA NEGATIVE  NEGATIVE mg/dL   Hgb urine dipstick NEGATIVE  NEGATIVE   Bilirubin Urine NEGATIVE  NEGATIVE   Ketones, ur 15 (*) NEGATIVE mg/dL   Protein, ur NEGATIVE  NEGATIVE mg/dL   Urobilinogen, UA 1.0  0.0 - 1.0 mg/dL   Nitrite NEGATIVE  NEGATIVE   Leukocytes, UA TRACE (*) NEGATIVE  URINE MICROSCOPIC-ADD ON     Status: Abnormal   Collection Time    04/23/14  1:55 PM      Result Value Ref Range   Squamous Epithelial / LPF FEW  (*) RARE   WBC, UA 3-6  <3 WBC/hpf   Crystals CA OXALATE CRYSTALS (*) NEGATIVE   Urine-Other TALC CRYSTAL PRESENT    WET PREP, GENITAL     Status: Abnormal   Collection Time    04/23/14  3:30 PM      Result Value Ref Range   Yeast Wet Prep HPF POC NONE SEEN  NONE SEEN   Trich, Wet Prep NONE SEEN  NONE SEEN   Clue Cells Wet Prep HPF POC NONE SEEN  NONE SEEN   WBC, Wet Prep HPF POC MODERATE (*) NONE SEEN    ROS Vaginal bleeding +FM Low back pain  Physical Exam   Blood pressure 122/82, pulse 109, temperature 98.3 F (36.8 C), temperature source Oral, resp. rate 16, last menstrual period 08/22/2013, SpO2 99.00%, not currently breastfeeding.  Physical Exam  Gen: NAD Neg CVAT bilaterally.  Pelvic: No bleeding in vault, in or around cervix, +ectropion. Cervix soft, thick, internal os closed. Wet prep, GC/CT collected Vtx by Leopold's Cat I NST; no contractions  ED Course  Assessment: IUP at 34 6/7 wks Low back pain Report of VB; no evidence of bleeding seen  Plan: - Limited u/s - Urine culture - GC/CT - [place], [place] 04/23/2014 3:42 PM   Addendum:  Returned from u/s. Results = no evidence of placental abruption, anterior placenta, AFI 41.93 cm, cephalic presentation.  Flexeril given D/C'd home Advised heat Pelvic rest PTL precautions Continue FKCs Growth u/s at next visit; BPD [redacted]w[redacted]d Return to MAU for continued VB, LOF, ctxs or decreased FM  OOW tomorrow; will return to work on Thursday. Will address issues regarding work and back pain at next visit.  Farrel Gordon, CNM, MS 04/23/14@ 5:29 p.m.

## 2014-04-23 NOTE — Discharge Instructions (Signed)
Back Pain in Pregnancy °Back pain during pregnancy is common. It happens in about half of all pregnancies. It is important for you and your baby that you remain active during your pregnancy. If you feel that back pain is not allowing you to remain active or sleep well, it is time to see your caregiver. Back pain may be caused by several factors related to changes during your pregnancy. Fortunately, unless you had trouble with your back before your pregnancy, the pain is likely to get better after you deliver. °Low back pain usually occurs between the fifth and seventh months of pregnancy. It can, however, happen in the first couple months. Factors that increase the risk of back problems include:  °· Previous back problems. °· Injury to your back. °· Having twins or multiple births. °· A chronic cough. °· Stress. °· Job-related repetitive motions. °· Muscle or spinal disease in the back. °· Family history of back problems, ruptured (herniated) discs, or osteoporosis. °· Depression, anxiety, and panic attacks. °CAUSES  °· When you are pregnant, your body produces a hormone called relaxin. This hormone makes the ligaments connecting the low back and pubic bones more flexible. This flexibility allows the baby to be delivered more easily. When your ligaments are loose, your muscles need to work harder to support your back. Soreness in your back can come from tired muscles. Soreness can also come from back tissues that are irritated since they are receiving less support. °· As the baby grows, it puts pressure on the nerves and blood vessels in your pelvis. This can cause back pain. °· As the baby grows and gets heavier during pregnancy, the uterus pushes the stomach muscles forward and changes your center of gravity. This makes your back muscles work harder to maintain good posture. °SYMPTOMS  °Lumbar pain during pregnancy °Lumbar pain during pregnancy usually occurs at or above the waist in the center of the back. There  may be pain and numbness that radiates into your leg or foot. This is similar to low back pain experienced by non-pregnant women. It usually increases with sitting for long periods of time, standing, or repetitive lifting. Tenderness may also be present in the muscles along your upper back. °Posterior pelvic pain during pregnancy °Pain in the back of the pelvis is more common than lumbar pain in pregnancy. It is a deep pain felt in your side at the waistline, or across the tailbone (sacrum), or in both places. You may have pain on one or both sides. This pain can also go into the buttocks and backs of the upper thighs. Pubic and groin pain may also be present. The pain does not quickly resolve with rest, and morning stiffness may also be present. °Pelvic pain during pregnancy can be brought on by most activities. A high level of fitness before and during pregnancy may or may not prevent this problem. Labor pain is usually 1 to 2 minutes apart, lasts for about 1 minute, and involves a bearing down feeling or pressure in your pelvis. However, if you are at term with the pregnancy, constant low back pain can be the beginning of early labor, and you should be aware of this. °DIAGNOSIS  °X-rays of the back should not be done during the first 12 to 14 weeks of the pregnancy and only when absolutely necessary during the rest of the pregnancy. MRIs do not give off radiation and are safe during pregnancy. MRIs also should only be done when absolutely necessary. °HOME CARE INSTRUCTIONS °· Exercise   as directed by your caregiver. Exercise is the most effective way to prevent or manage back pain. If you have a back problem, it is especially important to avoid sports that require sudden body movements. Swimming and walking are great activities.  Do not stand in one place for long periods of time.  Do not wear high heels.  Sit in chairs with good posture. Use a pillow on your lower back if necessary. Make sure your head  rests over your shoulders and is not hanging forward.  Try sleeping on your side, preferably the left side, with a pillow or two between your legs. If you are sore after a night's rest, your bedmay betoo soft.Try placing a board between your mattress and box spring.  Listen to your body when lifting.If you are experiencing pain, ask for help or try bending yourknees more so you can use your leg muscles rather than your back muscles. Squat down when picking up something from the floor. Do not bend over.  Eat a healthy diet. Try to gain weight within your caregiver's recommendations.  Use heat or cold packs 3 to 4 times a day for 15 minutes to help with the pain.  Only take over-the-counter or prescription medicines for pain, discomfort, or fever as directed by your caregiver. Sudden (acute) back pain  Use bed rest for only the most extreme, acute episodes of back pain. Prolonged bed rest over 48 hours will aggravate your condition.  Ice is very effective for acute conditions.  Put ice in a plastic bag.  Place a towel between your skin and the bag.  Leave the ice on for 10 to 20 minutes every 2 hours, or as needed.  Using heat packs for 30 minutes prior to activities is also helpful. Continued back pain See your caregiver if you have continued problems. Your caregiver can help or refer you for appropriate physical therapy. With conditioning, most back problems can be avoided. Sometimes, a more serious issue may be the cause of back pain. You should be seen right away if new problems seem to be developing. Your caregiver may recommend:  A maternity girdle.  An elastic sling.  A back brace.  A massage therapist or acupuncture. SEEK MEDICAL CARE IF:   You are not able to do most of your daily activities, even when taking the pain medicine you were given.  You need a referral to a physical therapist or chiropractor.  You want to try acupuncture. SEEK IMMEDIATE MEDICAL CARE  IF:  You develop numbness, tingling, weakness, or problems with the use of your arms or legs.  You develop severe back pain that is no longer relieved with medicines.  You have a sudden change in bowel or bladder control.  You have increasing pain in other areas of the body.  You develop shortness of breath, dizziness, or fainting.  You develop nausea, vomiting, or sweating.  You have back pain which is similar to labor pains.  You have back pain along with your water breaking or vaginal bleeding.  You have back pain or numbness that travels down your leg.  Your back pain developed after you fell.  You develop pain on one side of your back. You may have a kidney stone.  You see blood in your urine. You may have a bladder infection or kidney stone.  You have back pain with blisters. You may have shingles. Back pain is fairly common during pregnancy but should not be accepted as just part of  the process. Back pain should always be treated as soon as possible. This will make your pregnancy as pleasant as possible. °Document Released: 03/10/2006 Document Revised: 02/22/2012 Document Reviewed: 04/21/2011 °[employer]® Patient Information ©2014 [employer]. ° °

## 2014-04-24 LAB — GC/CHLAMYDIA PROBE AMP
CT PROBE, AMP APTIMA: NEGATIVE
GC Probe RNA: NEGATIVE

## 2014-04-25 LAB — CULTURE, OB URINE

## 2014-05-01 LAB — OB RESULTS CONSOLE GBS: GBS: NEGATIVE

## 2014-05-05 ENCOUNTER — Encounter (HOSPITAL_COMMUNITY): Payer: Self-pay | Admitting: *Deleted

## 2014-05-05 ENCOUNTER — Inpatient Hospital Stay (HOSPITAL_COMMUNITY)
Admission: AD | Admit: 2014-05-05 | Discharge: 2014-05-05 | Disposition: A | Discharge status: Home / Self Care | Payer: Medicaid Other | Source: Ambulatory Visit | Attending: Obstetrics and Gynecology | Admitting: Obstetrics and Gynecology

## 2014-05-05 DIAGNOSIS — O99891 Other specified diseases and conditions complicating pregnancy: Secondary | ICD-10-CM | POA: Insufficient documentation

## 2014-05-05 DIAGNOSIS — O9989 Other specified diseases and conditions complicating pregnancy, childbirth and the puerperium: Secondary | ICD-10-CM

## 2014-05-05 DIAGNOSIS — N949 Unspecified condition associated with female genital organs and menstrual cycle: Secondary | ICD-10-CM | POA: Insufficient documentation

## 2014-05-05 LAB — URINE MICROSCOPIC-ADD ON

## 2014-05-05 LAB — URINALYSIS, ROUTINE W REFLEX MICROSCOPIC
BILIRUBIN URINE: NEGATIVE
Glucose, UA: NEGATIVE mg/dL
Hgb urine dipstick: NEGATIVE
Ketones, ur: NEGATIVE mg/dL
Nitrite: NEGATIVE
PROTEIN: NEGATIVE mg/dL
Specific Gravity, Urine: 1.025 (ref 1.005–1.030)
UROBILINOGEN UA: 1 mg/dL (ref 0.0–1.0)
pH: 6.5 (ref 5.0–8.0)

## 2014-05-05 LAB — WET PREP, GENITAL
CLUE CELLS WET PREP: NONE SEEN
TRICH WET PREP: NONE SEEN

## 2014-05-05 NOTE — MAU Provider Note (Signed)
History   23yo, [id number] at [redacted]w[redacted]d presents with continuing vaginal pressure.  Denies VB, UCs, LOF, recent fever, resp or GI c/o's, UTI or PIH s/s. GFM.   Patient Active Problem List   Diagnosis Date Noted  . Second hand tobacco smoke exposure 03/09/2013  . S/P laparoscopic cholecystectomy 02/06/2013  . Hx of preeclampsia, prior pregnancy, currently pregnant 01/09/2013  . History of congestive heart failure 01/09/2013  . Mild persistent asthma 11/18/2012    Chief Complaint  Patient presents with  . Abdominal Pain   HPI  OB History   Grav Para Term Preterm Abortions TAB SAB Ect Mult Living   4 2 1 1 1  1   2      Obstetric Comments   2011 PIH;IOL; PP CHF-HOSPITALIZED X 1 WEEK      Past Medical History  Diagnosis Date  . Eczema   . CHF (congestive heart failure)     RESOLVED; HAD CARDIAC EVAL   . Congestive heart failure 2011    related to preeclampsia  . Hyperthyroidism AGE 52    NO TX  . Hypothyroidism AGE 63     NO TX  . Pregnancy induced hypertension 2011  . Preterm labor 2011  . Infection     UTI OCC  . Infection 2013    [place]  . Headache(784.0) 2011    MIGRAQINES; OTC  . Asthma 2009; CORNERSTONE    INHALER PRN  . Vaginal Pap smear, abnormal     colpo    Past Surgical History  Procedure Laterality Date  . Tonsillectomy    . Wisdom tooth extraction    . Tear duct probing    . Cholecystectomy      Family History  Problem Relation Age of Onset  . Anesthesia problems Neg Hx   . Other Neg Hx   . Asthma Brother   . Learning disabilities Brother   . Asthma Daughter   . Learning disabilities Daughter   . Seizures Daughter   . Learning disabilities Other   . Colon cancer Maternal Grandmother   . Cancer Maternal Grandmother     colon    History  Substance Use Topics  . Smoking status: Never Smoker   . Smokeless tobacco: Never Used  . Alcohol Use: No    Allergies: No Known Allergies  Prescriptions prior to admission  Medication Sig Dispense  Refill  . cyclobenzaprine (FLEXERIL) 10 MG tablet Take 1 tablet (10 mg total) by mouth 3 (three) times daily as needed for muscle spasms.  30 tablet  0  . pantoprazole (PROTONIX) 40 MG tablet Take 1 tablet (40 mg total) by mouth daily.  30 tablet  11  . Prenatal Vit-Fe Fumarate-FA (PRENATAL MULTIVITAMIN) TABS tablet Take 1 tablet by mouth daily at 12 noon.      Marland Kitchen albuterol (PROVENTIL HFA;VENTOLIN HFA) 108 (90 BASE) MCG/ACT inhaler Inhale 2 puffs into the lungs every 6 (six) hours as needed for wheezing.  1 Inhaler  4    ROS ROS: see HPI above, all other systems are negative  Physical Exam   Blood pressure 115/79, pulse 125, temperature 98 F (36.7 C), height 5' (1.524 m), weight 196 lb (88.905 kg), last menstrual period 08/22/2013, SpO2 98.00%, not currently breastfeeding.  Physical Exam Chest: Clear Heart: RRR Abdomen: gravid, NT Extremities: WNL  Dilation: 1 Effacement (%): 20 Cervical Position: Middle Station: -3 Presentation: Vertex Exam by:: J Mystie Ormand CNM  FHT: Reactive NST UCs: None  Results for orders placed during the hospital  encounter of 05/05/14 (from the past 24 hour(s))  URINALYSIS, ROUTINE W REFLEX MICROSCOPIC     Status: Abnormal   Collection Time    05/05/14  2:27 PM      Result Value Ref Range   Color, Urine YELLOW  YELLOW   APPearance CLEAR  CLEAR   Specific Gravity, Urine 1.025  1.005 - 1.030   pH 6.5  5.0 - 8.0   Glucose, UA NEGATIVE  NEGATIVE mg/dL   Hgb urine dipstick NEGATIVE  NEGATIVE   Bilirubin Urine NEGATIVE  NEGATIVE   Ketones, ur NEGATIVE  NEGATIVE mg/dL   Protein, ur NEGATIVE  NEGATIVE mg/dL   Urobilinogen, UA 1.0  0.0 - 1.0 mg/dL   Nitrite NEGATIVE  NEGATIVE   Leukocytes, UA TRACE (*) NEGATIVE  URINE MICROSCOPIC-ADD ON     Status: Abnormal   Collection Time    05/05/14  2:27 PM      Result Value Ref Range   Squamous Epithelial / LPF FEW (*) RARE   WBC, UA 3-6  <3 WBC/hpf   RBC / HPF 0-2  <3 RBC/hpf   Bacteria, UA FEW (*) RARE    Urine-Other MUCOUS PRESENT    WET PREP, GENITAL     Status: Abnormal   Collection Time    05/05/14  3:15 PM      Result Value Ref Range   Yeast Wet Prep HPF POC FEW (*) NONE SEEN   Trich, Wet Prep NONE SEEN  NONE SEEN   Clue Cells Wet Prep HPF POC NONE SEEN  NONE SEEN   WBC, Wet Prep HPF POC FEW (*) NONE SEEN    ED Course  IUP at [redacted]w[redacted]d Vaginal pressure  Wet prep - neg  D/c home with precautions ROB already scheduled for Tuesday      Linda Hedges CNM, MSN 05/05/2014 3:33 PM

## 2014-05-05 NOTE — Discharge Instructions (Signed)
Third Trimester of Pregnancy °The third trimester is from week 29 through week 42, months 7 through 9. The third trimester is a time when the fetus is growing rapidly. At the end of the ninth month, the fetus is about 20 inches in length and weighs 6 10 pounds.  °BODY CHANGES °Your body goes through many changes during pregnancy. The changes vary from woman to woman.  °· Your weight will continue to increase. You can expect to gain 25 35 pounds (11 16 kg) by the end of the pregnancy. °· You may begin to get stretch marks on your hips, abdomen, and breasts. °· You may urinate more often because the fetus is moving lower into your pelvis and pressing on your bladder. °· You may develop or continue to have heartburn as a result of your pregnancy. °· You may develop constipation because certain hormones are causing the muscles that push waste through your intestines to slow down. °· You may develop hemorrhoids or swollen, bulging veins (varicose veins). °· You may have pelvic pain because of the weight gain and pregnancy hormones relaxing your joints between the bones in your pelvis. Back aches may result from over exertion of the muscles supporting your posture. °· Your breasts will continue to grow and be tender. A yellow discharge may leak from your breasts called colostrum. °· Your belly button may stick out. °· You may feel short of breath because of your expanding uterus. °· You may notice the fetus "dropping," or moving lower in your abdomen. °· You may have a bloody mucus discharge. This usually occurs a few days to a week before labor begins. °· Your cervix becomes thin and soft (effaced) near your due date. °WHAT TO EXPECT AT YOUR PRENATAL EXAMS  °You will have prenatal exams every 2 weeks until week 36. Then, you will have weekly prenatal exams. During a routine prenatal visit: °· You will be weighed to make sure you and the fetus are growing normally. °· Your blood pressure is taken. °· Your abdomen will be  measured to track your baby's growth. °· The fetal heartbeat will be listened to. °· Any test results from the previous visit will be discussed. °· You may have a cervical check near your due date to see if you have effaced. °At around 36 weeks, your caregiver will check your cervix. At the same time, your caregiver will also perform a test on the secretions of the vaginal tissue. This test is to determine if a type of bacteria, Group B streptococcus, is present. Your caregiver will explain this further. °Your caregiver may ask you: °· What your birth plan is. °· How you are feeling. °· If you are feeling the baby move. °· If you have had any abnormal symptoms, such as leaking fluid, bleeding, severe headaches, or abdominal cramping. °· If you have any questions. °Other tests or screenings that may be performed during your third trimester include: °· Blood tests that check for low iron levels (anemia). °· Fetal testing to check the health, activity level, and growth of the fetus. Testing is done if you have certain medical conditions or if there are problems during the pregnancy. °FALSE LABOR °You may feel small, irregular contractions that eventually go away. These are called Braxton Hicks contractions, or false labor. Contractions may last for hours, days, or even weeks before true labor sets in. If contractions come at regular intervals, intensify, or become painful, it is best to be seen by your caregiver.  °  SIGNS OF LABOR  °· Menstrual-like cramps. °· Contractions that are 5 minutes apart or less. °· Contractions that start on the top of the uterus and spread down to the lower abdomen and back. °· A sense of increased pelvic pressure or back pain. °· A watery or bloody mucus discharge that comes from the vagina. °If you have any of these signs before the 37th week of pregnancy, call your caregiver right away. You need to go to the hospital to get checked immediately. °HOME CARE INSTRUCTIONS  °· Avoid all  smoking, herbs, alcohol, and unprescribed drugs. These chemicals affect the formation and growth of the baby. °· Follow your caregiver's instructions regarding medicine use. There are medicines that are either safe or unsafe to take during pregnancy. °· Exercise only as directed by your caregiver. Experiencing uterine cramps is a good sign to stop exercising. °· Continue to eat regular, healthy meals. °· Wear a good support bra for breast tenderness. °· Do not use hot tubs, steam rooms, or saunas. °· Wear your seat belt at all times when driving. °· Avoid raw meat, uncooked cheese, cat litter boxes, and soil used by cats. These carry germs that can cause birth defects in the baby. °· Take your prenatal vitamins. °· Try taking a stool softener (if your caregiver approves) if you develop constipation. Eat more high-fiber foods, such as fresh vegetables or fruit and whole grains. Drink plenty of fluids to keep your urine clear or pale yellow. °· Take warm sitz baths to soothe any pain or discomfort caused by hemorrhoids. Use hemorrhoid cream if your caregiver approves. °· If you develop varicose veins, wear support hose. Elevate your feet for 15 minutes, 3 4 times a day. Limit salt in your diet. °· Avoid heavy lifting, wear low heal shoes, and practice good posture. °· Rest a lot with your legs elevated if you have leg cramps or low back pain. °· Visit your dentist if you have not gone during your pregnancy. Use a soft toothbrush to brush your teeth and be gentle when you floss. °· A sexual relationship may be continued unless your caregiver directs you otherwise. °· Do not travel far distances unless it is absolutely necessary and only with the approval of your caregiver. °· Take prenatal classes to understand, practice, and ask questions about the labor and delivery. °· Make a trial run to the hospital. °· Pack your hospital bag. °· Prepare the baby's nursery. °· Continue to go to all your prenatal visits as directed  by your caregiver. °SEEK MEDICAL CARE IF: °· You are unsure if you are in labor or if your water has broken. °· You have dizziness. °· You have mild pelvic cramps, pelvic pressure, or nagging pain in your abdominal area. °· You have persistent nausea, vomiting, or diarrhea. °· You have a bad smelling vaginal discharge. °· You have pain with urination. °SEEK IMMEDIATE MEDICAL CARE IF:  °· You have a fever. °· You are leaking fluid from your vagina. °· You have spotting or bleeding from your vagina. °· You have severe abdominal cramping or pain. °· You have rapid weight loss or gain. °· You have shortness of breath with chest pain. °· You notice sudden or extreme swelling of your face, hands, ankles, feet, or legs. °· You have not felt your baby move in over an hour. °· You have severe headaches that do not go away with medicine. °· You have vision changes. °Document Released: 11/24/2001 Document Revised: 08/02/2013 Document Reviewed:   01/31/2013 [employer] Patient Information 2014 [place].  Braxton Hicks Contractions Pregnancy is commonly associated with contractions of the uterus throughout the pregnancy. Towards the end of pregnancy (32 to 34 weeks), these contractions [facility] Hol[facility]) can develop more often and may become more forceful. This is not true labor because these contractions do not result in opening (dilatation) and thinning of the cervix. They are sometimes difficult to tell apart from true labor because these contractions can be forceful and people have different pain tolerances. You should not feel embarrassed if you go to the hospital with false labor. Sometimes, the only way to tell if you are in true labor is for your caregiver to follow the changes in the cervix. How to tell the difference between true and false labor:  False labor.  The contractions of false labor are usually shorter, irregular and not as hard as those of true labor.  They are often felt in the front of the lower  abdomen and in the groin.  They may leave with walking around or changing positions while lying down.  They get weaker and are shorter lasting as time goes on.  These contractions are usually irregular.  They do not usually become progressively stronger, regular and closer together as with true labor.  True labor.  Contractions in true labor last 30 to 70 seconds, become very regular, usually become more intense, and increase in frequency.  They do not go away with walking.  The discomfort is usually felt in the top of the uterus and spreads to the lower abdomen and low back.  True labor can be determined by your caregiver with an exam. This will show that the cervix is dilating and getting thinner. If there are no prenatal problems or other health problems associated with the pregnancy, it is completely safe to be sent home with false labor and await the onset of true labor. HOME CARE INSTRUCTIONS   Keep up with your usual exercises and instructions.  Take medications as directed.  Keep your regular prenatal appointment.  Eat and drink lightly if you think you are going into labor.  If BH contractions are making you uncomfortable:  Change your activity position from lying down or resting to walking/walking to resting.  Sit and rest in a tub of warm water.  Drink 2 to 3 glasses of water. Dehydration may cause B-H contractions.  Do slow and deep breathing several times an hour. SEEK IMMEDIATE MEDICAL CARE IF:   Your contractions continue to become stronger, more regular, and closer together.  You have a gushing, burst or leaking of fluid from the vagina.  An oral temperature above 102 F (38.9 C) develops.  You have passage of blood-tinged mucus.  You develop vaginal bleeding.  You develop continuous belly (abdominal) pain.  You have low back pain that you never had before.  You feel the baby's head pushing down causing pelvic pressure.  The baby is not moving  as much as it used to. Document Released: 11/30/2005 Document Revised: 02/22/2012 Document Reviewed: 09/11/2013 [facility] Patient Information 2014 [place], [place].

## 2014-05-05 NOTE — MAU Note (Signed)
Pt presents to MAU with complaints of contractions throughout the day and have gotten worse as the day progresses. Denies any vaginal bleeding or LOF.

## 2014-05-08 ENCOUNTER — Inpatient Hospital Stay (HOSPITAL_COMMUNITY)
Admission: AD | Admit: 2014-05-08 | Discharge: 2014-05-08 | Disposition: A | Discharge status: Home / Self Care | Payer: Medicaid Other | Source: Ambulatory Visit | Attending: Obstetrics and Gynecology | Admitting: Obstetrics and Gynecology

## 2014-05-08 ENCOUNTER — Encounter (HOSPITAL_COMMUNITY): Payer: Self-pay | Admitting: *Deleted

## 2014-05-08 DIAGNOSIS — O368390 Maternal care for abnormalities of the fetal heart rate or rhythm, unspecified trimester, not applicable or unspecified: Secondary | ICD-10-CM | POA: Insufficient documentation

## 2014-05-08 DIAGNOSIS — Z9089 Acquired absence of other organs: Secondary | ICD-10-CM | POA: Insufficient documentation

## 2014-05-08 DIAGNOSIS — O479 False labor, unspecified: Secondary | ICD-10-CM | POA: Insufficient documentation

## 2014-05-08 LAB — URINALYSIS, ROUTINE W REFLEX MICROSCOPIC
BILIRUBIN URINE: NEGATIVE
Glucose, UA: NEGATIVE mg/dL
Hgb urine dipstick: NEGATIVE
KETONES UR: NEGATIVE mg/dL
Leukocytes, UA: NEGATIVE
NITRITE: NEGATIVE
PH: 6.5 (ref 5.0–8.0)
Protein, ur: NEGATIVE mg/dL
Specific Gravity, Urine: 1.025 (ref 1.005–1.030)
Urobilinogen, UA: 1 mg/dL (ref 0.0–1.0)

## 2014-05-08 NOTE — Progress Notes (Signed)
Farrel Gordon CNM notified of pt's admission and status. Sent u/a and CNM will see pt

## 2014-05-08 NOTE — MAU Note (Signed)
Patient states she is having abdominal and back pain that is far apart but feels like they are contractions. Nausea, no vomiting. Denies bleeding or leaking and reports good fetal movement.

## 2014-05-08 NOTE — Progress Notes (Signed)
Irregularity in FHR noted with not particular pattern

## 2014-05-08 NOTE — Discharge Instructions (Signed)
Pelvic Rest Pelvic rest is sometimes recommended for women when:  The placenta is partially or completely covering the opening of the cervix (placenta previa). There is bleeding between the uterine wall and the amniotic sac in the first trimester (subchorionic hemorrhage). The cervix begins to open without labor starting (incompetent cervix, cervical insufficiency). The labor is too early (preterm labor). HOME CARE INSTRUCTIONS Do not have sexual intercourse, stimulation, or an orgasm. Do not use tampons, douche, or put anything in the vagina. Do not lift anything over 10 pounds (4.5 kg). Avoid strenuous activity or straining your pelvic muscles. SEEK MEDICAL CARE IF: You have any vaginal bleeding during pregnancy. Treat this as a potential emergency. You have cramping pain felt low in the stomach (stronger than menstrual cramps). You notice vaginal discharge (watery, mucus, or bloody). You have a low, dull backache. There are regular contractions or uterine tightening. SEEK IMMEDIATE MEDICAL CARE IF: You have vaginal bleeding and have placenta previa.  Document Released: 03/27/2011 Document Revised: 02/22/2012 Document Reviewed: 03/27/2011 [facility], [facility], [facility] Patient Information 2014 [place]. Dehydration, Adult Dehydration is when you lose more fluids from the body than you take in. Vital organs like the kidneys, brain, and heart cannot function without a proper amount of fluids and salt. Any loss of fluids from the body can cause dehydration.  CAUSES  Vomiting. Diarrhea. Excessive sweating. Excessive urine output. Fever. SYMPTOMS  Mild dehydration Thirst. Dry lips. Slightly dry mouth. Moderate dehydration Very dry mouth. Sunken eyes. Skin does not bounce back quickly when lightly pinched and released. Dark urine and decreased urine production. Decreased tear production. Headache. Severe dehydration Very dry mouth. Extreme thirst. Rapid, weak pulse (more than 100 beats per  minute at rest). Cold hands and feet. Not able to sweat in spite of heat and temperature. Rapid breathing. Blue lips. Confusion and lethargy. Difficulty being awakened. Minimal urine production. No tears. DIAGNOSIS  Your caregiver will diagnose dehydration based on your symptoms and your exam. Blood and urine tests will help confirm the diagnosis. The diagnostic evaluation should also identify the cause of dehydration. TREATMENT  Treatment of mild or moderate dehydration can often be done at home by increasing the amount of fluids that you drink. It is best to drink small amounts of fluid more often. Drinking too much at one time can make vomiting worse. Refer to the home care instructions below. Severe dehydration needs to be treated at the hospital where you will probably be given intravenous (IV) fluids that contain water and electrolytes. HOME CARE INSTRUCTIONS  Ask your caregiver about specific rehydration instructions. Drink enough fluids to keep your urine clear or pale yellow. Drink small amounts frequently if you have nausea and vomiting. Eat as you normally do. Avoid: Foods or drinks high in sugar. Carbonated drinks. Juice. Extremely hot or cold fluids. Drinks with caffeine. Fatty, greasy foods. Alcohol. Tobacco. Overeating. Gelatin desserts. Wash your hands well to avoid spreading bacteria and viruses. Only take over-the-counter or prescription medicines for pain, discomfort, or fever as directed by your caregiver. Ask your caregiver if you should continue all prescribed and over-the-counter medicines. Keep all follow-up appointments with your caregiver. SEEK MEDICAL CARE IF: You have abdominal pain and it increases or stays in one area (localizes). You have a rash, stiff neck, or severe headache. You are irritable, sleepy, or difficult to awaken. You are weak, dizzy, or extremely thirsty. SEEK IMMEDIATE MEDICAL CARE IF:  You are unable to keep fluids down or you  get worse despite treatment. You have  frequent episodes of vomiting or diarrhea. You have blood or green matter (bile) in your vomit. You have blood in your stool or your stool looks black and tarry. You have not urinated in 6 to 8 hours, or you have only urinated a small amount of very dark urine. You have a fever. You faint. MAKE SURE YOU:  Understand these instructions. Will watch your condition. Will get help right away if you are not doing well or get worse. Document Released: 11/30/2005 Document Revised: 02/22/2012 Document Reviewed: 07/20/2011 [facility] Patient Information 2014 [place], [place]. Braxton Hicks Contractions Pregnancy is commonly associated with contractions of the uterus throughout the pregnancy. Towards the end of pregnancy (32 to 34 weeks), these contractions [facility]) can develop more often and may become more forceful. This is not true labor because these contractions do not result in opening (dilatation) and thinning of the cervix. They are sometimes difficult to tell apart from true labor because these contractions can be forceful and people have different pain tolerances. You should not feel embarrassed if you go to the hospital with false labor. Sometimes, the only way to tell if you are in true labor is for your caregiver to follow the changes in the cervix. How to tell the difference between true and false labor:  False labor.  The contractions of false labor are usually shorter, irregular and not as hard as those of true labor.  They are often felt in the front of the lower abdomen and in the groin.  They may leave with walking around or changing positions while lying down.  They get weaker and are shorter lasting as time goes on.  These contractions are usually irregular.  They do not usually become progressively stronger, regular and closer together as with true labor.  True labor.  Contractions in true labor last 30 to 70 seconds, become very  regular, usually become more intense, and increase in frequency.  They do not go away with walking.  The discomfort is usually felt in the top of the uterus and spreads to the lower abdomen and low back.  True labor can be determined by your caregiver with an exam. This will show that the cervix is dilating and getting thinner. If there are no prenatal problems or other health problems associated with the pregnancy, it is completely safe to be sent home with false labor and await the onset of true labor. HOME CARE INSTRUCTIONS   Keep up with your usual exercises and instructions.  Take medications as directed.  Keep your regular prenatal appointment.  Eat and drink lightly if you think you are going into labor.  If BH contractions are making you uncomfortable:  Change your activity position from lying down or resting to walking/walking to resting.  Sit and rest in a tub of warm water.  Drink 2 to 3 glasses of water. Dehydration may cause B-H contractions.  Do slow and deep breathing several times an hour. SEEK IMMEDIATE MEDICAL CARE IF:   Your contractions continue to become stronger, more regular, and closer together.  You have a gushing, burst or leaking of fluid from the vagina.  An oral temperature above 102 F (38.9 C) develops.  You have passage of blood-tinged mucus.  You develop vaginal bleeding.  You develop continuous belly (abdominal) pain.  You have low back pain that you never had before.  You feel the baby's head pushing down causing pelvic pressure.  The baby is not moving as much as it used  to. Document Released: 11/30/2005 Document Revised: 02/22/2012 Document Reviewed: 09/11/2013 [facility] Patient Information 2014 [place], [place].

## 2014-05-08 NOTE — MAU Provider Note (Signed)
History  23 yo [id number] @ 7 0/7 wks presents to MAU w/ c/o ongoing ctxs that have become progressively worse since clinic visit today. Reports intercourse within the past 24 hours and only drinking 3-4 small cups of water per day. Denies VB or LOF. Reports active fetus. Requests membrane sweep.  Patient Active Problem List   Diagnosis Date Noted  . Second hand tobacco smoke exposure 03/09/2013  . S/P laparoscopic cholecystectomy 02/06/2013  . Hx of preeclampsia, prior pregnancy, currently pregnant 01/09/2013  . History of congestive heart failure 01/09/2013  . Mild persistent asthma 11/18/2012    Chief Complaint  Patient presents with  . Labor Eval   HPI See above OB History   Grav Para Term Preterm Abortions TAB SAB Ect Mult Living   4 2 1 1 1  1   2      Obstetric Comments   2011 PIH;IOL; PP CHF-HOSPITALIZED X 1 WEEK      Past Medical History  Diagnosis Date  . Eczema   . CHF (congestive heart failure)     RESOLVED; HAD CARDIAC EVAL   . Congestive heart failure 2011    related to preeclampsia  . Hyperthyroidism AGE 47    NO TX  . Hypothyroidism AGE 68     NO TX  . Pregnancy induced hypertension 2011  . Preterm labor 2011  . Infection     UTI OCC  . Infection 2013    Caberfae  . Headache(784.0) 2011    MIGRAQINES; OTC  . Asthma 2009; CORNERSTONE    INHALER PRN  . Vaginal Pap smear, abnormal     colpo    Past Surgical History  Procedure Laterality Date  . Tonsillectomy    . Wisdom tooth extraction    . Tear duct probing    . Cholecystectomy      Family History  Problem Relation Age of Onset  . Anesthesia problems Neg Hx   . Other Neg Hx   . Asthma Brother   . Learning disabilities Brother   . Asthma Daughter   . Learning disabilities Daughter   . Seizures Daughter   . Learning disabilities Other   . Colon cancer Maternal Grandmother   . Cancer Maternal Grandmother     colon    History  Substance Use Topics  . Smoking status: Never Smoker   .  Smokeless tobacco: Never Used  . Alcohol Use: No    Allergies: No Known Allergies  Prescriptions prior to admission  Medication Sig Dispense Refill  . acetaminophen (TYLENOL) 325 MG tablet Take 650 mg by mouth every 6 (six) hours as needed.      . Prenatal Vit-Fe Fumarate-FA (PRENATAL MULTIVITAMIN) TABS tablet Take 1 tablet by mouth daily at 12 noon.      Marland Kitchen albuterol (PROVENTIL HFA;VENTOLIN HFA) 108 (90 BASE) MCG/ACT inhaler Inhale 2 puffs into the lungs every 6 (six) hours as needed for wheezing.  1 Inhaler  4  . cyclobenzaprine (FLEXERIL) 10 MG tablet Take 1 tablet (10 mg total) by mouth 3 (three) times daily as needed for muscle spasms.  30 tablet  0  . pantoprazole (PROTONIX) 40 MG tablet Take 1 tablet (40 mg total) by mouth daily.  30 tablet  11    ROS +FM +ctxs Physical Exam   Blood pressure 130/88, pulse 110, temperature 98.3 F (36.8 C), temperature source Oral, resp. rate 16, height 4' 11.5" (1.511 m), weight 195 lb 6.4 oz (88.633 kg), last menstrual period 08/22/2013, SpO2 97.00%,  not currently breastfeeding.  Physical Exam In NAD Abd: soft, non-tender Pelvic: FT/thick/high Ext: WNL FHRT: Cat I Toco: Uterine irritability Results for orders placed during the hospital encounter of 05/08/14 (from the past 24 hour(s))  URINALYSIS, ROUTINE W REFLEX MICROSCOPIC     Status: None   Collection Time    05/08/14  4:30 PM      Result Value Ref Range   Color, Urine YELLOW  YELLOW   APPearance CLEAR  CLEAR   Specific Gravity, Urine 1.025  1.005 - 1.030   pH 6.5  5.0 - 8.0   Glucose, UA NEGATIVE  NEGATIVE mg/dL   Hgb urine dipstick NEGATIVE  NEGATIVE   Bilirubin Urine NEGATIVE  NEGATIVE   Ketones, ur NEGATIVE  NEGATIVE mg/dL   Protein, ur NEGATIVE  NEGATIVE mg/dL   Urobilinogen, UA 1.0  0.0 - 1.0 mg/dL   Nitrite NEGATIVE  NEGATIVE   Leukocytes, UA NEGATIVE  NEGATIVE   ED Course  Assessment: Not in labor  Plan: D/C home w/ strict labor precautions Pelvic rest   Hydrate Too early for membrane sweep; explained rationale. Pt. verbalized understanding RTO as scheduled   [place], MS 05/08/2014 5:29 PM

## 2014-05-10 ENCOUNTER — Encounter (HOSPITAL_COMMUNITY): Payer: Self-pay

## 2014-05-10 ENCOUNTER — Inpatient Hospital Stay (HOSPITAL_COMMUNITY)
Admission: AD | Admit: 2014-05-10 | Discharge: 2014-05-10 | Disposition: A | Discharge status: Home / Self Care | Payer: Medicaid Other | Source: Ambulatory Visit | Attending: Obstetrics and Gynecology | Admitting: Obstetrics and Gynecology

## 2014-05-10 DIAGNOSIS — O99891 Other specified diseases and conditions complicating pregnancy: Secondary | ICD-10-CM | POA: Insufficient documentation

## 2014-05-10 DIAGNOSIS — M549 Dorsalgia, unspecified: Secondary | ICD-10-CM | POA: Insufficient documentation

## 2014-05-10 DIAGNOSIS — O9989 Other specified diseases and conditions complicating pregnancy, childbirth and the puerperium: Principal | ICD-10-CM

## 2014-05-10 LAB — URINALYSIS, ROUTINE W REFLEX MICROSCOPIC
BILIRUBIN URINE: NEGATIVE
Glucose, UA: NEGATIVE mg/dL
Hgb urine dipstick: NEGATIVE
Ketones, ur: NEGATIVE mg/dL
Leukocytes, UA: NEGATIVE
Nitrite: NEGATIVE
PH: 6.5 (ref 5.0–8.0)
Protein, ur: NEGATIVE mg/dL
SPECIFIC GRAVITY, URINE: 1.015 (ref 1.005–1.030)
UROBILINOGEN UA: 1 mg/dL (ref 0.0–1.0)

## 2014-05-10 MED ORDER — OXYCODONE-ACETAMINOPHEN 5-325 MG PO TABS
2.0000 | ORAL_TABLET | Freq: Once | ORAL | Status: AC
  Administered 2014-05-10: 2 via ORAL
  Filled 2014-05-10: qty 2

## 2014-05-10 NOTE — Discharge Instructions (Signed)
Fetal Movement Counts Patient Name: __________________________________________________ Patient Due Date: ____________________ Performing a fetal movement count is highly recommended in high-risk pregnancies, but it is good for every pregnant woman to do. Your caregiver may ask you to start counting fetal movements at 28 weeks of the pregnancy. Fetal movements often increase:  After eating a full meal.  After physical activity.  After eating or drinking something sweet or cold.  At rest. Pay attention to when you feel the baby is most active. This will help you notice a pattern of your baby's sleep and wake cycles and what factors contribute to an increase in fetal movement. It is important to perform a fetal movement count at the same time each day when your baby is normally most active.  HOW TO COUNT FETAL MOVEMENTS 1. Find a quiet and comfortable area to sit or lie down on your left side. Lying on your left side provides the best blood and oxygen circulation to your baby. 2. Write down the day and time on a sheet of paper or in a journal. 3. Start counting kicks, flutters, swishes, rolls, or jabs in a 2 hour period. You should feel at least 10 movements within 2 hours. 4. If you do not feel 10 movements in 2 hours, wait 2 3 hours and count again. Look for a change in the pattern or not enough counts in 2 hours. SEEK MEDICAL CARE IF:  You feel less than 10 counts in 2 hours, tried twice.  There is no movement in over an hour.  The pattern is changing or taking longer each day to reach 10 counts in 2 hours.  You feel the baby is not moving as he or she usually does. Date: ____________ Movements: ____________ Start time: ____________ Brenda Spencer time: ____________  Date: ____________ Movements: ____________ Start time: ____________ Brenda Spencer time: ____________ Date: ____________ Movements: ____________ Start time: ____________ Brenda Spencer time: ____________ Date: ____________ Movements: ____________  Start time: ____________ Brenda Spencer time: ____________ Date: ____________ Movements: ____________ Start time: ____________ Brenda Spencer time: ____________ Date: ____________ Movements: ____________ Start time: ____________ Brenda Spencer time: ____________ Date: ____________ Movements: ____________ Start time: ____________ Brenda Spencer time: ____________ Date: ____________ Movements: ____________ Start time: ____________ Brenda Spencer time: ____________  Date: ____________ Movements: ____________ Start time: ____________ Brenda Spencer time: ____________ Date: ____________ Movements: ____________ Start time: ____________ Brenda Spencer time: ____________ Date: ____________ Movements: ____________ Start time: ____________ Brenda Spencer time: ____________ Date: ____________ Movements: ____________ Start time: ____________ Brenda Spencer time: ____________ Date: ____________ Movements: ____________ Start time: ____________ Brenda Spencer time: ____________ Date: ____________ Movements: ____________ Start time: ____________ Brenda Spencer time: ____________ Date: ____________ Movements: ____________ Start time: ____________ Brenda Spencer time: ____________  Date: ____________ Movements: ____________ Start time: ____________ Brenda Spencer time: ____________ Date: ____________ Movements: ____________ Start time: ____________ Brenda Spencer time: ____________ Date: ____________ Movements: ____________ Start time: ____________ Brenda Spencer time: ____________ Date: ____________ Movements: ____________ Start time: ____________ Brenda Spencer time: ____________ Date: ____________ Movements: ____________ Start time: ____________ Brenda Spencer time: ____________ Date: ____________ Movements: ____________ Start time: ____________ Brenda Spencer time: ____________ Date: ____________ Movements: ____________ Start time: ____________ Brenda Spencer time: ____________  Date: ____________ Movements: ____________ Start time: ____________ Brenda Spencer time: ____________ Date: ____________ Movements: ____________ Start time: ____________ Brenda Spencer time:  ____________ Date: ____________ Movements: ____________ Start time: ____________ Brenda Spencer time: ____________ Date: ____________ Movements: ____________ Start time: ____________ Brenda Spencer time: ____________ Date: ____________ Movements: ____________ Start time: ____________ Brenda Spencer time: ____________ Date: ____________ Movements: ____________ Start time: ____________ Brenda Spencer time: ____________ Date: ____________ Movements: ____________ Start time: ____________ Brenda Spencer time: ____________  Date: ____________ Movements: ____________ Start time: ____________ Brenda Spencer  time: ____________ Date: ____________ Movements: ____________ Start time: ____________ Brenda Spencer time: ____________ Date: ____________ Movements: ____________ Start time: ____________ Brenda Spencer time: ____________ Date: ____________ Movements: ____________ Start time: ____________ Brenda Spencer time: ____________ Date: ____________ Movements: ____________ Start time: ____________ Brenda Spencer time: ____________ Date: ____________ Movements: ____________ Start time: ____________ Brenda Spencer time: ____________ Date: ____________ Movements: ____________ Start time: ____________ Brenda Spencer time: ____________  Date: ____________ Movements: ____________ Start time: ____________ Brenda Spencer time: ____________ Date: ____________ Movements: ____________ Start time: ____________ Brenda Spencer time: ____________ Date: ____________ Movements: ____________ Start time: ____________ Brenda Spencer time: ____________ Date: ____________ Movements: ____________ Start time: ____________ Brenda Spencer time: ____________ Date: ____________ Movements: ____________ Start time: ____________ Brenda Spencer time: ____________ Date: ____________ Movements: ____________ Start time: ____________ Brenda Spencer time: ____________ Date: ____________ Movements: ____________ Start time: ____________ Brenda Spencer time: ____________  Date: ____________ Movements: ____________ Start time: ____________ Brenda Spencer time: ____________ Date: ____________ Movements:  ____________ Start time: ____________ Brenda Spencer time: ____________ Date: ____________ Movements: ____________ Start time: ____________ Brenda Spencer time: ____________ Date: ____________ Movements: ____________ Start time: ____________ Brenda Spencer time: ____________ Date: ____________ Movements: ____________ Start time: ____________ Brenda Spencer time: ____________ Date: ____________ Movements: ____________ Start time: ____________ Brenda Spencer time: ____________ Date: ____________ Movements: ____________ Start time: ____________ Brenda Spencer time: ____________  Date: ____________ Movements: ____________ Start time: ____________ Brenda Spencer time: ____________ Date: ____________ Movements: ____________ Start time: ____________ Brenda Spencer time: ____________ Date: ____________ Movements: ____________ Start time: ____________ Brenda Spencer time: ____________ Date: ____________ Movements: ____________ Start time: ____________ Brenda Spencer time: ____________ Date: ____________ Movements: ____________ Start time: ____________ Brenda Spencer time: ____________ Date: ____________ Movements: ____________ Start time: ____________ Brenda Spencer time: ____________ Document Released: 12/30/2006 Document Revised: 11/16/2012 Document Reviewed: 09/26/2012 [employer] Patient Information 2014 [place]. Third Trimester of Pregnancy The third trimester is from week 29 through week 42, months 7 through 9. The third trimester is a time when the fetus is growing rapidly. At the end of the ninth month, the fetus is about 20 inches in length and weighs 6 10 pounds.  BODY CHANGES Your body goes through many changes during pregnancy. The changes vary from woman to woman.   Your weight will continue to increase. You can expect to gain 25 35 pounds (11 16 kg) by the end of the pregnancy.  You may begin to get stretch marks on your hips, abdomen, and breasts.  You may urinate more often because the fetus is moving lower into your pelvis and pressing on your bladder.  You may develop or  continue to have heartburn as a result of your pregnancy.  You may develop constipation because certain hormones are causing the muscles that push waste through your intestines to slow down.  You may develop hemorrhoids or swollen, bulging veins (varicose veins).  You may have pelvic pain because of the weight gain and pregnancy hormones relaxing your joints between the bones in your pelvis. Back aches may result from over exertion of the muscles supporting your posture.  Your breasts will continue to grow and be tender. A yellow discharge may leak from your breasts called colostrum.  Your belly button may stick out.  You may feel short of breath because of your expanding uterus.  You may notice the fetus "dropping," or moving lower in your abdomen.  You may have a bloody mucus discharge. This usually occurs a few days to a week before labor begins.  Your cervix becomes thin and soft (effaced) near your due date. WHAT TO EXPECT AT YOUR PRENATAL EXAMS  You will have prenatal exams every 2 weeks until week 36. Then, you will have weekly  prenatal exams. During a routine prenatal visit:  You will be weighed to make sure you and the fetus are growing normally.  Your blood pressure is taken.  Your abdomen will be measured to track your baby's growth.  The fetal heartbeat will be listened to.  Any test results from the previous visit will be discussed.  You may have a cervical check near your due date to see if you have effaced. At around 36 weeks, your caregiver will check your cervix. At the same time, your caregiver will also perform a test on the secretions of the vaginal tissue. This test is to determine if a type of bacteria, Group B streptococcus, is present. Your caregiver will explain this further. Your caregiver may ask you:  What your birth plan is.  How you are feeling.  If you are feeling the baby move.  If you have had any abnormal symptoms, such as leaking fluid,  bleeding, severe headaches, or abdominal cramping.  If you have any questions. Other tests or screenings that may be performed during your third trimester include:  Blood tests that check for low iron levels (anemia).  Fetal testing to check the health, activity level, and growth of the fetus. Testing is done if you have certain medical conditions or if there are problems during the pregnancy. FALSE LABOR You may feel small, irregular contractions that eventually go away. These are called Braxton Hicks contractions, or false labor. Contractions may last for hours, days, or even weeks before true labor sets in. If contractions come at regular intervals, intensify, or become painful, it is best to be seen by your caregiver.  SIGNS OF LABOR   Menstrual-like cramps.  Contractions that are 5 minutes apart or less.  Contractions that start on the top of the uterus and spread down to the lower abdomen and back.  A sense of increased pelvic pressure or back pain.  A watery or bloody mucus discharge that comes from the vagina. If you have any of these signs before the 37th week of pregnancy, call your caregiver right away. You need to go to the hospital to get checked immediately. HOME CARE INSTRUCTIONS   Avoid all smoking, herbs, alcohol, and unprescribed drugs. These chemicals affect the formation and growth of the baby.  Follow your caregiver's instructions regarding medicine use. There are medicines that are either safe or unsafe to take during pregnancy.  Exercise only as directed by your caregiver. Experiencing uterine cramps is a good sign to stop exercising.  Continue to eat regular, healthy meals.  Wear a good support bra for breast tenderness.  Do not use hot tubs, steam rooms, or saunas.  Wear your seat belt at all times when driving.  Avoid raw meat, uncooked cheese, cat litter boxes, and soil used by cats. These carry germs that can cause birth defects in the baby.  Take  your prenatal vitamins.  Try taking a stool softener (if your caregiver approves) if you develop constipation. Eat more high-fiber foods, such as fresh vegetables or fruit and whole grains. Drink plenty of fluids to keep your urine clear or pale yellow.  Take warm sitz baths to soothe any pain or discomfort caused by hemorrhoids. Use hemorrhoid cream if your caregiver approves.  If you develop varicose veins, wear support hose. Elevate your feet for 15 minutes, 3 4 times a day. Limit salt in your diet.  Avoid heavy lifting, wear low heal shoes, and practice good posture.  Rest a lot with your  legs elevated if you have leg cramps or low back pain.  Visit your dentist if you have not gone during your pregnancy. Use a soft toothbrush to brush your teeth and be gentle when you floss.  A sexual relationship may be continued unless your caregiver directs you otherwise.  Do not travel far distances unless it is absolutely necessary and only with the approval of your caregiver.  Take prenatal classes to understand, practice, and ask questions about the labor and delivery.  Make a trial run to the hospital.  Pack your hospital bag.  Prepare the baby's nursery.  Continue to go to all your prenatal visits as directed by your caregiver. SEEK MEDICAL CARE IF:  You are unsure if you are in labor or if your water has broken.  You have dizziness.  You have mild pelvic cramps, pelvic pressure, or nagging pain in your abdominal area.  You have persistent nausea, vomiting, or diarrhea.  You have a bad smelling vaginal discharge.  You have pain with urination. SEEK IMMEDIATE MEDICAL CARE IF:   You have a fever.  You are leaking fluid from your vagina.  You have spotting or bleeding from your vagina.  You have severe abdominal cramping or pain.  You have rapid weight loss or gain.  You have shortness of breath with chest pain.  You notice sudden or extreme swelling of your face,  hands, ankles, feet, or legs.  You have not felt your baby move in over an hour.  You have severe headaches that do not go away with medicine.  You have vision changes. Document Released: 11/24/2001 Document Revised: 08/02/2013 Document Reviewed: 01/31/2013 [facility] Patient Information 2014 Brenda Spencer.

## 2014-05-10 NOTE — MAU Provider Note (Signed)
Pt @ 37+ wks evaluated for back pain. No VB or LOF. Active fetus. Per S. Lilliard, CNM, cx 2 cm. 2 percocet tabs given during evaluation. Cat I FHRT, uterine irritability. Not in labor.   D/c'd home w/ labor precautions.  Farrel Gordon, CNM 05/10/14 @ 11:28 a.m.

## 2014-05-10 NOTE — Progress Notes (Signed)
Prince Solian CNM called and orders received to discharge pt home

## 2014-05-10 NOTE — MAU Note (Signed)
Low back pain since 4 am. Was constant, now comes & goes, abdomen tightens with pain. Doesn't know frequency. Denies vaginal bleeding/LOF/vaginal discharge. Denies n/v/d. Denies dysuria. Positive fetal movement.

## 2014-05-11 ENCOUNTER — Encounter (HOSPITAL_COMMUNITY): Payer: Self-pay | Admitting: *Deleted

## 2014-05-11 ENCOUNTER — Inpatient Hospital Stay (HOSPITAL_COMMUNITY)
Admission: AD | Admit: 2014-05-11 | Discharge: 2014-05-11 | Disposition: A | Discharge status: Home / Self Care | Payer: Medicaid Other | Source: Ambulatory Visit | Attending: Obstetrics and Gynecology | Admitting: Obstetrics and Gynecology

## 2014-05-11 DIAGNOSIS — M549 Dorsalgia, unspecified: Secondary | ICD-10-CM | POA: Insufficient documentation

## 2014-05-11 DIAGNOSIS — O479 False labor, unspecified: Secondary | ICD-10-CM | POA: Insufficient documentation

## 2014-05-11 DIAGNOSIS — O212 Late vomiting of pregnancy: Secondary | ICD-10-CM | POA: Insufficient documentation

## 2014-05-11 DIAGNOSIS — O9989 Other specified diseases and conditions complicating pregnancy, childbirth and the puerperium: Secondary | ICD-10-CM

## 2014-05-11 DIAGNOSIS — O99891 Other specified diseases and conditions complicating pregnancy: Secondary | ICD-10-CM | POA: Insufficient documentation

## 2014-05-11 DIAGNOSIS — R030 Elevated blood-pressure reading, without diagnosis of hypertension: Secondary | ICD-10-CM | POA: Insufficient documentation

## 2014-05-11 LAB — CBC
HCT: 29.9 % — ABNORMAL LOW (ref 36.0–46.0)
Hemoglobin: 9.3 g/dL — ABNORMAL LOW (ref 12.0–15.0)
MCH: 24 pg — ABNORMAL LOW (ref 26.0–34.0)
MCHC: 31.1 g/dL (ref 30.0–36.0)
MCV: 77.1 fL — ABNORMAL LOW (ref 78.0–100.0)
Platelets: 195 10*3/uL (ref 150–400)
RBC: 3.88 MIL/uL (ref 3.87–5.11)
RDW: 15.3 % (ref 11.5–15.5)
WBC: 10.3 10*3/uL (ref 4.0–10.5)

## 2014-05-11 LAB — COMPREHENSIVE METABOLIC PANEL
ALBUMIN: 2.3 g/dL — AB (ref 3.5–5.2)
ALT: 9 U/L (ref 0–35)
AST: 11 U/L (ref 0–37)
Alkaline Phosphatase: 194 U/L — ABNORMAL HIGH (ref 39–117)
BILIRUBIN TOTAL: 0.4 mg/dL (ref 0.3–1.2)
BUN: 3 mg/dL — ABNORMAL LOW (ref 6–23)
CHLORIDE: 100 meq/L (ref 96–112)
CO2: 21 mEq/L (ref 19–32)
Calcium: 8.6 mg/dL (ref 8.4–10.5)
Creatinine, Ser: 0.38 mg/dL — ABNORMAL LOW (ref 0.50–1.10)
GFR calc Af Amer: >90 mL/min (ref >90)
GFR calc non Af Amer: >90 mL/min (ref >90)
Glucose, Bld: 84 mg/dL (ref 70–99)
POTASSIUM: 3.6 meq/L — AB (ref 3.7–5.3)
Sodium: 135 mEq/L — ABNORMAL LOW (ref 137–147)
Total Protein: 6.1 g/dL (ref 6.0–8.3)

## 2014-05-11 LAB — PROTEIN / CREATININE RATIO, URINE
Creatinine, Urine: 112.84 mg/dL
PROTEIN CREATININE RATIO: 0.21 — AB (ref 0.00–0.15)
Total Protein, Urine: 23.5 mg/dL

## 2014-05-11 LAB — URIC ACID: Uric Acid, Serum: 4 mg/dL (ref 2.4–7.0)

## 2014-05-11 LAB — LACTATE DEHYDROGENASE: LDH: 182 U/L (ref 94–250)

## 2014-05-11 MED ORDER — BUTORPHANOL TARTRATE 1 MG/ML IJ SOLN
1.0000 mg | Freq: Once | INTRAMUSCULAR | Status: AC
  Administered 2014-05-11: 1 mg via INTRAVENOUS
  Filled 2014-05-11: qty 1

## 2014-05-11 MED ORDER — LACTATED RINGERS IV SOLN
INTRAVENOUS | Status: DC
  Administered 2014-05-11: 09:00:00 via INTRAVENOUS

## 2014-05-11 MED ORDER — PROMETHAZINE HCL 25 MG/ML IJ SOLN
25.0000 mg | Freq: Four times a day (QID) | INTRAMUSCULAR | Status: DC | PRN
  Administered 2014-05-11: 25 mg via INTRAVENOUS
  Filled 2014-05-11: qty 1

## 2014-05-11 MED ORDER — LACTATED RINGERS IV BOLUS (SEPSIS)
500.0000 mL | Freq: Once | INTRAVENOUS | Status: AC
  Administered 2014-05-11: 500 mL via INTRAVENOUS

## 2014-05-11 MED ORDER — ACETAMINOPHEN-CODEINE #3 300-30 MG PO TABS: 1.0000 | ORAL_TABLET | Freq: Four times a day (QID) | ORAL | Status: DC | PRN

## 2014-05-11 MED ORDER — PROMETHAZINE HCL 12.5 MG PO TABS: 12.5000 mg | ORAL_TABLET | Freq: Four times a day (QID) | ORAL | Status: DC | PRN

## 2014-05-11 NOTE — Progress Notes (Signed)
Pt still with back pain.  No longer has nausea and vommiting BP 125/87  Pulse 97  Temp(Src) 98.5 F (36.9 C) (Oral)  Resp 20  Ht 4' 11.5" (1.511 m)  Wt 88.905 kg (196 lb)  BMI 38.94 kg/m2  LMP 05/07/2014  Breastfeeding? Unknown Abd soft cx unchanged.  Checked per pts request Back no CVATB Recent Results (from the past 2160 hour(s))  URINALYSIS, ROUTINE W REFLEX MICROSCOPIC     Status: Abnormal   Collection Time    03/08/14  7:48 PM      Result Value Ref Range   Color, Urine YELLOW  YELLOW   APPearance CLEAR  CLEAR   Specific Gravity, Urine >1.030 (*) 1.005 - 1.030   pH 6.0  5.0 - 8.0   Glucose, UA NEGATIVE  NEGATIVE mg/dL   Hgb urine dipstick NEGATIVE  NEGATIVE   Bilirubin Urine NEGATIVE  NEGATIVE   Ketones, ur NEGATIVE  NEGATIVE mg/dL   Protein, ur NEGATIVE  NEGATIVE mg/dL   Urobilinogen, UA 1.0  0.0 - 1.0 mg/dL   Nitrite NEGATIVE  NEGATIVE   Leukocytes, UA NEGATIVE  NEGATIVE   Comment: MICROSCOPIC NOT DONE ON URINES WITH NEGATIVE PROTEIN, BLOOD, LEUKOCYTES, NITRITE, OR GLUCOSE <1000 mg/dL.  PROTEIN / CREATININE RATIO, URINE     Status: None   Collection Time    03/08/14  7:48 PM      Result Value Ref Range   Creatinine, Urine 198.76     Total Protein, Urine 18.7     Comment: NO NORMAL RANGE ESTABLISHED FOR THIS TEST   PROTEIN CREATININE RATIO 0.09  0.00 - 0.15  CBC WITH DIFFERENTIAL     Status: Abnormal   Collection Time    03/08/14  9:50 PM      Result Value Ref Range   WBC 12.4 (*) 4.0 - 10.5 K/uL   RBC 3.65 (*) 3.87 - 5.11 MIL/uL   Hemoglobin 10.1 (*) 12.0 - 15.0 g/dL   HCT 30.8 (*) 36.0 - 46.0 %   MCV 84.4  78.0 - 100.0 fL   MCH 27.7  26.0 - 34.0 pg   MCHC 32.8  30.0 - 36.0 g/dL   RDW 13.8  11.5 - 15.5 %   Platelets 264  150 - 400 K/uL   Neutrophils Relative % 72  43 - 77 %   Neutro Abs 8.9 (*) 1.7 - 7.7 K/uL   Lymphocytes Relative 18  12 - 46 %   Lymphs Abs 2.2  0.7 - 4.0 K/uL   Monocytes Relative 10  3 - 12 %   Monocytes Absolute 1.2 (*) 0.1 - 1.0  K/uL   Eosinophils Relative 1  0 - 5 %   Eosinophils Absolute 0.1  0.0 - 0.7 K/uL   Basophils Relative 0  0 - 1 %   Basophils Absolute 0.0  0.0 - 0.1 K/uL  COMPREHENSIVE METABOLIC PANEL     Status: Abnormal   Collection Time    03/08/14  9:50 PM      Result Value Ref Range   Sodium 138  137 - 147 mEq/L   Potassium 3.8  3.7 - 5.3 mEq/L   Chloride 102  96 - 112 mEq/L   CO2 22  19 - 32 mEq/L   Glucose, Bld 77  70 - 99 mg/dL   BUN 6  6 - 23 mg/dL   Creatinine, Ser 0.39 (*) 0.50 - 1.10 mg/dL   Calcium 8.2 (*) 8.4 - 10.5 mg/dL   Total Protein 6.2  6.0 - 8.3 g/dL   Albumin 2.5 (*) 3.5 - 5.2 g/dL   AST 10  0 - 37 U/L   ALT 8  0 - 35 U/L   Alkaline Phosphatase 121 (*) 39 - 117 U/L   Total Bilirubin 0.2 (*) 0.3 - 1.2 mg/dL   GFR calc non Af Amer >90  >90 mL/min   GFR calc Af Amer >90  >90 mL/min   Comment: (NOTE)     The eGFR has been calculated using the CKD EPI equation.     This calculation has not been validated in all clinical situations.     eGFR's persistently <90 mL/min signify possible Chronic Kidney     Disease.  LACTATE DEHYDROGENASE     Status: None   Collection Time    03/08/14  9:50 PM      Result Value Ref Range   LDH 154  94 - 250 U/L  URIC ACID     Status: None   Collection Time    03/08/14  9:50 PM      Result Value Ref Range   Uric Acid, Serum 3.1  2.4 - 7.0 mg/dL  CBC WITH DIFFERENTIAL     Status: Abnormal   Collection Time    03/19/14 11:27 AM      Result Value Ref Range   WBC 11.4 (*) 4.0 - 10.5 K/uL   RBC 3.89  3.87 - 5.11 MIL/uL   Hemoglobin 10.5 (*) 12.0 - 15.0 g/dL   HCT 32.4 (*) 36.0 - 46.0 %   MCV 83.3  78.0 - 100.0 fL   MCH 27.0  26.0 - 34.0 pg   MCHC 32.4  30.0 - 36.0 g/dL   RDW 14.0  11.5 - 15.5 %   Platelets 232  150 - 400 K/uL   Neutrophils Relative % 76  43 - 77 %   Neutro Abs 8.6 (*) 1.7 - 7.7 K/uL   Lymphocytes Relative 16  12 - 46 %   Lymphs Abs 1.8  0.7 - 4.0 K/uL   Monocytes Relative 7  3 - 12 %   Monocytes Absolute 0.8  0.1 - 1.0  K/uL   Eosinophils Relative 1  0 - 5 %   Eosinophils Absolute 0.1  0.0 - 0.7 K/uL   Basophils Relative 0  0 - 1 %   Basophils Absolute 0.0  0.0 - 0.1 K/uL  URINALYSIS, ROUTINE W REFLEX MICROSCOPIC     Status: Abnormal   Collection Time    04/03/14  8:35 AM      Result Value Ref Range   Color, Urine YELLOW  YELLOW   APPearance CLEAR  CLEAR   Specific Gravity, Urine 1.025  1.005 - 1.030   pH 6.5  5.0 - 8.0   Glucose, UA 100 (*) NEGATIVE mg/dL   Hgb urine dipstick LARGE (*) NEGATIVE   Bilirubin Urine NEGATIVE  NEGATIVE   Ketones, ur 15 (*) NEGATIVE mg/dL   Protein, ur NEGATIVE  NEGATIVE mg/dL   Urobilinogen, UA 0.2  0.0 - 1.0 mg/dL   Nitrite NEGATIVE  NEGATIVE   Leukocytes, UA SMALL (*) NEGATIVE  URINE MICROSCOPIC-ADD ON     Status: None   Collection Time    04/03/14  8:35 AM      Result Value Ref Range   Squamous Epithelial / LPF RARE  RARE   WBC, UA 3-6  <3 WBC/hpf   RBC / HPF 21-50  <3 RBC/hpf   Bacteria, UA RARE  RARE  URINALYSIS, ROUTINE W REFLEX MICROSCOPIC     Status: Abnormal   Collection Time    04/23/14  1:55 PM      Result Value Ref Range   Color, Urine YELLOW  YELLOW   APPearance HAZY (*) CLEAR   Specific Gravity, Urine >1.030 (*) 1.005 - 1.030   pH 6.0  5.0 - 8.0   Glucose, UA NEGATIVE  NEGATIVE mg/dL   Hgb urine dipstick NEGATIVE  NEGATIVE   Bilirubin Urine NEGATIVE  NEGATIVE   Ketones, ur 15 (*) NEGATIVE mg/dL   Protein, ur NEGATIVE  NEGATIVE mg/dL   Urobilinogen, UA 1.0  0.0 - 1.0 mg/dL   Nitrite NEGATIVE  NEGATIVE   Leukocytes, UA TRACE (*) NEGATIVE  URINE MICROSCOPIC-ADD ON     Status: Abnormal   Collection Time    04/23/14  1:55 PM      Result Value Ref Range   Squamous Epithelial / LPF FEW (*) RARE   WBC, UA 3-6  <3 WBC/hpf   Crystals CA OXALATE CRYSTALS (*) NEGATIVE   Urine-Other TALC CRYSTAL PRESENT     Comment: MUCOUS PRESENT  CULTURE, OB URINE     Status: None   Collection Time    04/23/14  1:55 PM      Result Value Ref Range   Specimen  Description URINE, CLEAN CATCH     Special Requests NONE     Culture  Setup Time       Value: 04/23/2014 19:19     Performed at [facility]       Value: 9,000 COLONIES/ML     Performed at [facility]   Culture       Value: INSIGNIFICANT GROWTH     Note: NO GROUP B STREP (S.AGALACTIAE) ISOLATED                                                              Culture based screening of vaginal/anorectal swabs at  35 to [redacted] weeks gestation is required to rule out the carriage of Group B Streptococcus.     Performed at [facility]   Report Status 04/25/2014 FINAL    GC/CHLAMYDIA PROBE AMP     Status: None   Collection Time    04/23/14  3:30 PM      Result Value Ref Range   CT Probe RNA NEGATIVE  NEGATIVE   GC Probe RNA NEGATIVE  NEGATIVE   Comment: (NOTE)                                                                                               **Normal Reference Range: Negative**          Assay performed using the Gen-Probe APTIMA COMBO2 (R) Assay.     Acceptable specimen types for this assay include APTIMA Swabs (Unisex,     endocervical, urethral, or vaginal), first void  urine, and ThinPrep     liquid based cytology samples.     Performed at [place], [facility]     Status: Abnormal   Collection Time    04/23/14  3:30 PM      Result Value Ref Range   Yeast Wet Prep HPF POC NONE SEEN  NONE SEEN   Trich, Wet Prep NONE SEEN  NONE SEEN   Clue Cells Wet Prep HPF POC NONE SEEN  NONE SEEN   WBC, Wet Prep HPF POC MODERATE (*) NONE SEEN   Comment: MODERATE BACTERIA SEEN  URINALYSIS, ROUTINE W REFLEX MICROSCOPIC     Status: Abnormal   Collection Time    05/05/14  2:27 PM      Result Value Ref Range   Color, Urine YELLOW  YELLOW   APPearance CLEAR  CLEAR   Specific Gravity, Urine 1.025  1.005 - 1.030   pH 6.5  5.0 - 8.0   Glucose, UA NEGATIVE  NEGATIVE mg/dL   Hgb urine dipstick NEGATIVE  NEGATIVE   Bilirubin Urine NEGATIVE   NEGATIVE   Ketones, ur NEGATIVE  NEGATIVE mg/dL   Protein, ur NEGATIVE  NEGATIVE mg/dL   Urobilinogen, UA 1.0  0.0 - 1.0 mg/dL   Nitrite NEGATIVE  NEGATIVE   Leukocytes, UA TRACE (*) NEGATIVE  URINE MICROSCOPIC-ADD ON     Status: Abnormal   Collection Time    05/05/14  2:27 PM      Result Value Ref Range   Squamous Epithelial / LPF FEW (*) RARE   WBC, UA 3-6  <3 WBC/hpf   RBC / HPF 0-2  <3 RBC/hpf   Bacteria, UA FEW (*) RARE   Urine-Other MUCOUS PRESENT    WET PREP, GENITAL     Status: Abnormal   Collection Time    05/05/14  3:15 PM      Result Value Ref Range   Yeast Wet Prep HPF POC FEW (*) NONE SEEN   Trich, Wet Prep NONE SEEN  NONE SEEN   Clue Cells Wet Prep HPF POC NONE SEEN  NONE SEEN   WBC, Wet Prep HPF POC FEW (*) NONE SEEN   Comment: MODERATE BACTERIA SEEN  URINALYSIS, ROUTINE W REFLEX MICROSCOPIC     Status: None   Collection Time    05/08/14  4:30 PM      Result Value Ref Range   Color, Urine YELLOW  YELLOW   APPearance CLEAR  CLEAR   Specific Gravity, Urine 1.025  1.005 - 1.030   pH 6.5  5.0 - 8.0   Glucose, UA NEGATIVE  NEGATIVE mg/dL   Hgb urine dipstick NEGATIVE  NEGATIVE   Bilirubin Urine NEGATIVE  NEGATIVE   Ketones, ur NEGATIVE  NEGATIVE mg/dL   Protein, ur NEGATIVE  NEGATIVE mg/dL   Urobilinogen, UA 1.0  0.0 - 1.0 mg/dL   Nitrite NEGATIVE  NEGATIVE   Leukocytes, UA NEGATIVE  NEGATIVE   Comment: MICROSCOPIC NOT DONE ON URINES WITH NEGATIVE PROTEIN, BLOOD, LEUKOCYTES, NITRITE, OR GLUCOSE <1000 mg/dL.  URINALYSIS, ROUTINE W REFLEX MICROSCOPIC     Status: None   Collection Time    05/10/14  6:05 AM      Result Value Ref Range   Color, Urine YELLOW  YELLOW   APPearance CLEAR  CLEAR   Specific Gravity, Urine 1.015  1.005 - 1.030   pH 6.5  5.0 - 8.0   Glucose, UA NEGATIVE  NEGATIVE mg/dL   Hgb urine dipstick NEGATIVE  NEGATIVE   Bilirubin Urine NEGATIVE  NEGATIVE   Ketones, ur NEGATIVE  NEGATIVE mg/dL   Protein, ur NEGATIVE  NEGATIVE mg/dL    Urobilinogen, UA 1.0  0.0 - 1.0 mg/dL   Nitrite NEGATIVE  NEGATIVE   Leukocytes, UA NEGATIVE  NEGATIVE   Comment: MICROSCOPIC NOT DONE ON URINES WITH NEGATIVE PROTEIN, BLOOD, LEUKOCYTES, NITRITE, OR GLUCOSE <1000 mg/dL.  PROTEIN / CREATININE RATIO, URINE     Status: Abnormal   Collection Time    05/11/14  7:50 AM      Result Value Ref Range   Creatinine, Urine 112.84     Total Protein, Urine 23.5     Comment: NO NORMAL RANGE ESTABLISHED FOR THIS TEST   PROTEIN CREATININE RATIO 0.21 (*) 0.00 - 0.15  CBC     Status: Abnormal   Collection Time    05/11/14  8:35 AM      Result Value Ref Range   WBC 10.3  4.0 - 10.5 K/uL   RBC 3.88  3.87 - 5.11 MIL/uL   Hemoglobin 9.3 (*) 12.0 - 15.0 g/dL   HCT 29.9 (*) 36.0 - 46.0 %   MCV 77.1 (*) 78.0 - 100.0 fL   MCH 24.0 (*) 26.0 - 34.0 pg   MCHC 31.1  30.0 - 36.0 g/dL   RDW 15.3  11.5 - 15.5 %   Platelets 195  150 - 400 K/uL  COMPREHENSIVE METABOLIC PANEL     Status: Abnormal   Collection Time    05/11/14  8:35 AM      Result Value Ref Range   Sodium 135 (*) 137 - 147 mEq/L   Potassium 3.6 (*) 3.7 - 5.3 mEq/L   Chloride 100  96 - 112 mEq/L   CO2 21  19 - 32 mEq/L   Glucose, Bld 84  70 - 99 mg/dL   BUN 3 (*) 6 - 23 mg/dL   Creatinine, Ser 0.38 (*) 0.50 - 1.10 mg/dL   Calcium 8.6  8.4 - 10.5 mg/dL   Total Protein 6.1  6.0 - 8.3 g/dL   Albumin 2.3 (*) 3.5 - 5.2 g/dL   AST 11  0 - 37 U/L   ALT 9  0 - 35 U/L   Alkaline Phosphatase 194 (*) 39 - 117 U/L   Total Bilirubin 0.4  0.3 - 1.2 mg/dL   GFR calc non Af Amer >90  >90 mL/min   GFR calc Af Amer >90  >90 mL/min   Comment: (NOTE)     The eGFR has been calculated using the CKD EPI equation.     This calculation has not been validated in all clinical situations.     eGFR's persistently <90 mL/min signify possible Chronic Kidney     Disease.  LACTATE DEHYDROGENASE     Status: None   Collection Time    05/11/14  8:35 AM      Result Value Ref Range   LDH 182  94 - 250 U/L  URIC ACID      Status: None   Collection Time    05/11/14  8:35 AM      Result Value Ref Range   Uric Acid, Serum 4.0  2.4 - 7.0 mg/dL   Back pain with n/v N/v improved will do po challenge and give tylenol number 3 for back pain

## 2014-05-11 NOTE — MAU Note (Signed)
Contractions/and pain all night.  Gotten worse, mainly in back

## 2014-05-11 NOTE — MAU Provider Note (Signed)
History   23 yo [id number] at 37 3/7 weeks presented after calling with increased back pain since MAU visit yesterday for same c/o.  Denies leaking or bleeding, unsure if contracting vs spasmodic back pain.  Received po Percocet yesterday in MAU, but no Rx.  Cervix 2 cm yesterday, uterine irritability noted, Category 1 FHR.  Home around noon.  Reports increased back pain during night, "no sleep", with vomiting this am. Requesting sweeping of membranes.  Seen in MAU on 5/26 for contractions--cervix FT, thick, uterine irritability  Patient Active Problem List   Diagnosis Date Noted  . Second hand tobacco smoke exposure 03/09/2013  . S/P laparoscopic cholecystectomy 02/06/2013  . Hx of preeclampsia, prior pregnancy, currently pregnant 01/09/2013  . History of congestive heart failure 01/09/2013  . Mild persistent asthma 11/18/2012    Chief Complaint  Patient presents with  . Labor Eval   HPI:  As above  OB History   Grav Para Term Preterm Abortions TAB SAB Ect Mult Living   4 2 1 1 1  1   2      Obstetric Comments   2011 PIH;IOL; PP CHF-HOSPITALIZED X 1 WEEK      Past Medical History  Diagnosis Date  . Eczema   . CHF (congestive heart failure)     RESOLVED; HAD CARDIAC EVAL   . Congestive heart failure 2011    related to preeclampsia  . Hyperthyroidism AGE 57    NO TX  . Hypothyroidism AGE 89     NO TX  . Pregnancy induced hypertension 2011  . Preterm labor 2011  . Infection     UTI OCC  . Infection 2013    [place]  . Headache(784.0) 2011    MIGRAQINES; OTC  . Asthma 2009; CORNERSTONE    INHALER PRN  . Vaginal Pap smear, abnormal     colpo    Past Surgical History  Procedure Laterality Date  . Tonsillectomy    . Wisdom tooth extraction    . Tear duct probing    . Cholecystectomy      Family History  Problem Relation Age of Onset  . Anesthesia problems Neg Hx   . Other Neg Hx   . Asthma Brother   . Learning disabilities Brother   . Asthma Daughter   .  Learning disabilities Daughter   . Seizures Daughter   . Learning disabilities Other   . Colon cancer Maternal Grandmother   . Cancer Maternal Grandmother     colon    History  Substance Use Topics  . Smoking status: Never Smoker   . Smokeless tobacco: Never Used  . Alcohol Use: No    Allergies: No Known Allergies  Prescriptions prior to admission  Medication Sig Dispense Refill  . acetaminophen (TYLENOL) 325 MG tablet Take 650 mg by mouth every 6 (six) hours as needed for mild pain.       . cyclobenzaprine (FLEXERIL) 10 MG tablet Take 1 tablet (10 mg total) by mouth 3 (three) times daily as needed for muscle spasms.  30 tablet  0  . pantoprazole (PROTONIX) 40 MG tablet Take 1 tablet (40 mg total) by mouth daily.  30 tablet  11  . Prenatal Vit-Fe Fumarate-FA (PRENATAL MULTIVITAMIN) TABS tablet Take 1 tablet by mouth daily at 12 noon.      Marland Kitchen albuterol (PROVENTIL HFA;VENTOLIN HFA) 108 (90 BASE) MCG/ACT inhaler Inhale 2 puffs into the lungs every 6 (six) hours as needed for wheezing.  1 Inhaler  4  ROS:  Low back pain, N/V, +FM, ? contractions Physical Exam   Blood pressure 125/87, pulse 97, temperature 98.5 F (36.9 C), temperature source Oral, resp. rate 20, height 4' 11.5" (1.511 m), weight 196 lb (88.905 kg), last menstrual period 08/22/2013, not currently breastfeeding.  Physical Exam Appears miserable with back pain. Chest clear Heart RRR without murmur Abd gravid, NT Negative CVAT Pelvic--cervix posterior, 2 cm, 80%, vtx, -2, IBOW Ext WNL  FHR Category 1 UCs none at present  ED Course  Assessment: IUP at 37 3/7 weeks Back pain N/V Mild elevation of BP  Plan: IV hydration Anti-emetics Pain med CBC, CMP, LDH, uric acid PCR   Donnel Saxon CNM, MSN 05/11/2014 8:26 AM

## 2014-05-16 ENCOUNTER — Encounter (HOSPITAL_COMMUNITY): Payer: Self-pay | Admitting: *Deleted

## 2014-05-16 ENCOUNTER — Inpatient Hospital Stay (HOSPITAL_COMMUNITY)
Admission: AD | Admit: 2014-05-16 | Discharge: 2014-05-16 | Disposition: A | Discharge status: Home / Self Care | Payer: Medicaid Other | Source: Ambulatory Visit | Attending: Obstetrics and Gynecology | Admitting: Obstetrics and Gynecology

## 2014-05-16 DIAGNOSIS — O479 False labor, unspecified: Secondary | ICD-10-CM | POA: Insufficient documentation

## 2014-05-16 NOTE — Discharge Instructions (Signed)

## 2014-05-16 NOTE — MAU Provider Note (Signed)
History   23 yo [id number] at 69 1/7 weeks presented unannounced c/o contractions since midnight.  Denies leaking or bleeding, reports +FM.  Patient Active Problem List   Diagnosis Date Noted  . Second hand tobacco smoke exposure 03/09/2013  . S/P laparoscopic cholecystectomy 02/06/2013  . Hx of preeclampsia, prior pregnancy, currently pregnant 01/09/2013  . History of congestive heart failure 01/09/2013  . Mild persistent asthma 11/18/2012    Chief Complaint  Patient presents with  . Labor Eval   HPI:  See above  OB History   Grav Para Term Preterm Abortions TAB SAB Ect Mult Living   5 3 1 2 1  1   2      Obstetric Comments   2011 PIH;IOL; PP CHF-HOSPITALIZED X 1 WEEK      Past Medical History  Diagnosis Date  . Eczema   . CHF (congestive heart failure)     RESOLVED; HAD CARDIAC EVAL   . Congestive heart failure 2011    related to preeclampsia  . Hypothyroidism AGE 44     NO TX  . Pregnancy induced hypertension 2011  . Preterm labor 2011  . Infection     UTI OCC  . Infection 2013    [place]  . Headache(784.0) 2011    MIGRAQINES; OTC  . Asthma 2009; CORNERSTONE    INHALER PRN  . Vaginal Pap smear, abnormal     colpo  . Hyperthyroidism     Past Surgical History  Procedure Laterality Date  . Tonsillectomy    . Wisdom tooth extraction    . Tear duct probing    . Cholecystectomy      Family History  Problem Relation Age of Onset  . Anesthesia problems Neg Hx   . Other Neg Hx   . Asthma Brother   . Learning disabilities Brother   . Asthma Daughter   . Learning disabilities Daughter   . Seizures Daughter   . Learning disabilities Other   . Colon cancer Maternal Grandmother   . Cancer Maternal Grandmother     colon    History  Substance Use Topics  . Smoking status: Never Smoker   . Smokeless tobacco: Never Used  . Alcohol Use: No    Allergies: No Known Allergies  Prescriptions prior to admission  Medication Sig Dispense Refill  . acetaminophen  (TYLENOL) 325 MG tablet Take 650 mg by mouth every 6 (six) hours as needed for moderate pain.      . cyclobenzaprine (FLEXERIL) 10 MG tablet Take 1 tablet (10 mg total) by mouth 3 (three) times daily as needed for muscle spasms.  30 tablet  0  . Prenatal Vit-Fe Fumarate-FA (PRENATAL MULTIVITAMIN) TABS tablet Take 1 tablet by mouth daily at 12 noon.      . promethazine (PHENERGAN) 12.5 MG tablet Take 1 tablet (12.5 mg total) by mouth every 6 (six) hours as needed for nausea or vomiting.  30 tablet  0  . albuterol (PROVENTIL HFA;VENTOLIN HFA) 108 (90 BASE) MCG/ACT inhaler Inhale 2 puffs into the lungs every 6 (six) hours as needed for wheezing.  1 Inhaler  4    ROS:  Contractions, +FM Physical Exam   Blood pressure 128/81, pulse 101, temperature 98 F (36.7 C), temperature source Oral, resp. rate 16, last menstrual period 05/07/2014, unknown if currently breastfeeding.  Physical Exam Chest clear Heart RRR without murmur Abd gravid, NT Pelvic--initial RN exam:  External os 3, internal os 1-2, 60%, vtx Repeat exam after walking:  3  cm, 70%, vtx, -1, IBOW--minimal change Ext WNL  FHR Category 1 UCs irregular, mild, largely irritability.  ED Course  Assessment: IUP at 38 1/7 weeks Early vs prodromal labor GBS negative.  Plan: D/C home with labor precautions. Reviewed with patient s/s of advancing labor.  Advised her to call/return if UCs become more organized and consistent. Keep scheduled appt on 6/9 or return prn.   Donnel Saxon CNM, MSN 05/16/2014 11:56 AM

## 2014-05-16 NOTE — MAU Note (Signed)
C/o ucs since midnight last night; denies ROM;

## 2014-05-18 ENCOUNTER — Encounter (HOSPITAL_COMMUNITY): Payer: Self-pay | Admitting: *Deleted

## 2014-05-18 ENCOUNTER — Inpatient Hospital Stay (HOSPITAL_COMMUNITY)
Admission: AD | Admit: 2014-05-18 | Discharge: 2014-05-18 | Disposition: A | Discharge status: Home / Self Care | Payer: Medicaid Other | Source: Ambulatory Visit | Attending: Obstetrics and Gynecology | Admitting: Obstetrics and Gynecology

## 2014-05-18 DIAGNOSIS — B3731 Acute candidiasis of vulva and vagina: Secondary | ICD-10-CM | POA: Insufficient documentation

## 2014-05-18 DIAGNOSIS — N949 Unspecified condition associated with female genital organs and menstrual cycle: Secondary | ICD-10-CM | POA: Insufficient documentation

## 2014-05-18 DIAGNOSIS — B373 Candidiasis of vulva and vagina: Secondary | ICD-10-CM | POA: Insufficient documentation

## 2014-05-18 DIAGNOSIS — O2390 Unspecified genitourinary tract infection in pregnancy, unspecified trimester: Secondary | ICD-10-CM | POA: Insufficient documentation

## 2014-05-18 LAB — WET PREP, GENITAL
CLUE CELLS WET PREP: NONE SEEN
TRICH WET PREP: NONE SEEN

## 2014-05-18 MED ORDER — MORPHINE SULFATE 4 MG/ML IJ SOLN
4.0000 mg | Freq: Once | INTRAMUSCULAR | Status: AC
Start: 2014-05-18 — End: 2014-05-18
  Administered 2014-05-18: 4 mg via INTRAMUSCULAR
  Filled 2014-05-18: qty 1

## 2014-05-18 MED ORDER — FLUCONAZOLE 150 MG PO TABS
150.0000 mg | ORAL_TABLET | Freq: Once | ORAL | Status: AC
  Administered 2014-05-18: 150 mg via ORAL
  Filled 2014-05-18: qty 1

## 2014-05-18 NOTE — Discharge Instructions (Signed)
Candida Infection, Adult A candida infection (also called yeast, fungus and Monilia infection) is an overgrowth of yeast that can occur anywhere on the body. A yeast infection commonly occurs in warm, moist body areas. Usually, the infection remains localized but can spread to become a systemic infection. A yeast infection may be a sign of a more severe disease such as diabetes, leukemia, or AIDS. A yeast infection can occur in both men and women. In women, Candida vaginitis is a vaginal infection. It is one of the most common causes of vaginitis. Men usually do not have symptoms or know they have an infection until other problems develop. Men may find out they have a yeast infection because their sex partner has a yeast infection. Uncircumcised men are more likely to get a yeast infection than circumcised men. This is because the uncircumcised glans is not exposed to air and does not remain as dry as that of a circumcised glans. Older adults may develop yeast infections around dentures. CAUSES  Women  Antibiotics.  Steroid medication taken for a long time.  Being overweight (obese).  Diabetes.  Poor immune condition.  Certain serious medical conditions.  Immune suppressive medications for organ transplant patients.  Chemotherapy.  Pregnancy.  Menstration.  Stress and fatigue.  Intravenous drug use.  Oral contraceptives.  Wearing tight-fitting clothes in the crotch area.  Catching it from a sex partner who has a yeast infection.  Spermicide.  Intravenous, urinary, or other catheters. Men  Catching it from a sex partner who has a yeast infection.  Having oral or anal sex with a person who has the infection.  Spermicide.  Diabetes.  Antibiotics.  Poor immune system.  Medications that suppress the immune system.  Intravenous drug use.  Intravenous, urinary, or other catheters. SYMPTOMS  Women  Thick, white vaginal discharge.  Vaginal itching.  Redness and  swelling in and around the vagina.  Irritation of the lips of the vagina and perineum.  Blisters on the vaginal lips and perineum.  Painful sexual intercourse.  Low blood sugar (hypoglycemia).  Painful urination.  Bladder infections.  Intestinal problems such as constipation, indigestion, bad breath, bloating, increase in gas, diarrhea, or loose stools. Men  Men may develop intestinal problems such as constipation, indigestion, bad breath, bloating, increase in gas, diarrhea, or loose stools.  Dry, cracked skin on the penis with itching or discomfort.  Jock itch.  Dry, flaky skin.  Athlete's foot.  Hypoglycemia. DIAGNOSIS  Women  A history and an exam are performed.  The discharge may be examined under a microscope.  A culture may be taken of the discharge. Men  A history and an exam are performed.  Any discharge from the penis or areas of cracked skin will be looked at under the microscope and cultured.  Stool samples may be cultured. TREATMENT  Women  Vaginal antifungal suppositories and creams.  Medicated creams to decrease irritation and itching on the outside of the vagina.  Warm compresses to the perineal area to decrease swelling and discomfort.  Oral antifungal medications.  Medicated vaginal suppositories or cream for repeated or recurrent infections.  Wash and dry the irritation areas before applying the cream.  Eating yogurt with lactobacillus may help with prevention and treatment.  Sometimes painting the vagina with gentian violet solution may help if creams and suppositories do not work. Men  Antifungal creams and oral antifungal medications.  Sometimes treatment must continue for 30 days after the symptoms go away to prevent recurrence. HOME CARE   INSTRUCTIONS  Women  Use cotton underwear and avoid tight-fitting clothing.  Avoid colored, scented toilet paper and deodorant tampons or pads.  Do not douche.  Keep your diabetes  under control.  Finish all the prescribed medications.  Keep your skin clean and dry.  Consume milk or yogurt with lactobacillus active culture regularly. If you get frequent yeast infections and think that is what the infection is, there are over-the-counter medications that you can get. If the infection does not show healing in 3 days, talk to your caregiver.  Tell your sex partner you have a yeast infection. Your partner may need treatment also, especially if your infection does not clear up or recurs. Men  Keep your skin clean and dry.  Keep your diabetes under control.  Finish all prescribed medications.  Tell your sex partner that you have a yeast infection so they can be treated if necessary. SEEK MEDICAL CARE IF:   Your symptoms do not clear up or worsen in one week after treatment.  You have an oral temperature above 102 F (38.9 C).  You have trouble swallowing or eating for a prolonged time.  You develop blisters on and around your vagina.  You develop vaginal bleeding and it is not your menstrual period.  You develop abdominal pain.  You develop intestinal problems as mentioned above.  You get weak or lightheaded.  You have painful or increased urination.  You have pain during sexual intercourse. MAKE SURE YOU:   Understand these instructions.  Will watch your condition.  Will get help right away if you are not doing well or get worse. Document Released: 01/07/2005 Document Revised: 02/22/2012 Document Reviewed: 04/21/2010 [employer] Patient Information 2014 [employer].  

## 2014-05-18 NOTE — MAU Note (Signed)
Patient presents to MAU with c/o vaginal pressure that started around 1130 today. Denies LOF, VB, or contractions at this time. Reports some fetal movement; less than usual.

## 2014-05-18 NOTE — MAU Provider Note (Signed)
History    Patient is a 23y.o. [id number] at 38.3wks who presents complaining of vaginal pressure.  Patient states this has been ongoing, but is worse today starting at 1130am.  Patient states she took a tylenol at 1200pm with no relief. Patient reports irregular contractions, no vaginal bleeding, no lof, and + fetal movement. She reports sexual activity 3 days ago and had no issues. States she was checked on Wednesday and was 3 cm.     Patient Active Problem List   Diagnosis Date Noted  . Second hand tobacco smoke exposure 03/09/2013  . S/P laparoscopic cholecystectomy 02/06/2013  . Hx of preeclampsia, prior pregnancy, currently pregnant 01/09/2013  . History of congestive heart failure 01/09/2013  . Mild persistent asthma 11/18/2012    Chief Complaint  Patient presents with  . vaginal pressure     HPI  OB History   Grav Para Term Preterm Abortions TAB SAB Ect Mult Living   5 3 1 2 1  1   2      Obstetric Comments   2011 PIH;IOL; PP CHF-HOSPITALIZED X 1 WEEK      Past Medical History  Diagnosis Date  . Eczema   . CHF (congestive heart failure)     RESOLVED; HAD CARDIAC EVAL   . Congestive heart failure 2011    related to preeclampsia  . Hypothyroidism AGE 57     NO TX  . Pregnancy induced hypertension 2011  . Preterm labor 2011  . Infection     UTI OCC  . Infection 2013    [facility]  . Headache(784.0) 2011    MIGRAQINES; OTC  . Asthma 2009; [facility]NERSTONE    INHALER PRN  . Vaginal Pap smear, abnormal     colpo  . Hyperthyroidism     Past Surgical History  Procedure Laterality Date  . Tonsillectomy    . Wisdom tooth extraction    . Tear duct probing    . Cholecystectomy      Family History  Problem Relation Age of Onset  . Anesthesia problems Neg Hx   . Other Neg Hx   . Asthma Brother   . Learning disabilities Brother   . Asthma Daughter   . Learning disabilities Daughter   . Seizures Daughter   . Learning disabilities Other   . Colon cancer Maternal  Grandmother   . Cancer Maternal Grandmother     colon    History  Substance Use Topics  . Smoking status: Never Smoker   . Smokeless tobacco: Never Used  . Alcohol Use: No    Allergies: No Known Allergies  Prescriptions prior to admission  Medication Sig Dispense Refill  . acetaminophen (TYLENOL) 325 MG tablet Take 650 mg by mouth every 6 (six) hours as needed for moderate pain.      . cyclobenzaprine (FLEXERIL) 10 MG tablet Take 1 tablet (10 mg total) by mouth 3 (three) times daily as needed for muscle spasms.  30 tablet  0  . Prenatal Vit-Fe Fumarate-FA (PRENATAL MULTIVITAMIN) TABS tablet Take 1 tablet by mouth daily at 12 noon.      Marland Kitchen albuterol (PROVENTIL HFA;VENTOLIN HFA) 108 (90 BASE) MCG/ACT inhaler Inhale 2 puffs into the lungs every 6 (six) hours as needed for wheezing.  1 Inhaler  4    ROS  See HPI Above Physical Exam   Blood pressure 130/82, pulse 102, temperature 98.2 F (36.8 C), temperature source Oral, resp. rate 18, height 4' 11.5" (1.511 m), weight 198 lb 6.4 oz (89.994  kg), last menstrual period 05/07/2014, SpO2 99.00%, unknown if currently breastfeeding.  Results for orders placed during the hospital encounter of 05/18/14 (from the past 24 hour(s))  WET PREP, GENITAL     Status: Abnormal   Collection Time    05/18/14  3:22 PM      Result Value Ref Range   Yeast Wet Prep HPF POC MODERATE (*) NONE SEEN   Trich, Wet Prep NONE SEEN  NONE SEEN   Clue Cells Wet Prep HPF POC NONE SEEN  NONE SEEN   WBC, Wet Prep HPF POC MODERATE (*) NONE SEEN    Physical Exam SVE: 3/50/-1 with BOW palpated FHR: 145 bpm, Mod Var, -Decels, +Accels UC: None graphed or palpated Abdomen: Gravid, Soft NT Wet Prep collected, by vaginal swab SSE: Deferred  ED Course  Assessment: IUP at 38.3wks Vaginal Pressure Candidiasis of Vagina Cat I FT  Plan: -Wet prep-results pending -Morphine IM for pain -NST reactive, may remove EFM  Follow Up (1550) -Wet Prep-Positive for  Yeast -Diflucan given -Discussed induction protocol--no earlier than 41.3wks for non-medical elective induction -Discharge to home with labor precautions -Encouraged not to travel until after delivery of baby -Patient encouraged to rest and stay hydrated for the weekend. -Call if you have any questions or concerns prior to your next visit.  -Keep office visit on Tuesday 05/22/2014  Gavin Pound CNM, MSN 05/18/2014 3:00 PM

## 2014-05-20 ENCOUNTER — Inpatient Hospital Stay (HOSPITAL_COMMUNITY)
Admission: AD | Admit: 2014-05-20 | Discharge: 2014-05-20 | Disposition: A | Discharge status: Home / Self Care | Payer: Medicaid Other | Source: Ambulatory Visit | Attending: Obstetrics and Gynecology | Admitting: Obstetrics and Gynecology

## 2014-05-20 ENCOUNTER — Encounter (HOSPITAL_COMMUNITY): Payer: Self-pay

## 2014-05-20 ENCOUNTER — Other Ambulatory Visit: Payer: Self-pay

## 2014-05-20 DIAGNOSIS — O99019 Anemia complicating pregnancy, unspecified trimester: Secondary | ICD-10-CM | POA: Insufficient documentation

## 2014-05-20 DIAGNOSIS — R000 Tachycardia, unspecified: Secondary | ICD-10-CM | POA: Insufficient documentation

## 2014-05-20 DIAGNOSIS — O9989 Other specified diseases and conditions complicating pregnancy, childbirth and the puerperium: Principal | ICD-10-CM

## 2014-05-20 DIAGNOSIS — N949 Unspecified condition associated with female genital organs and menstrual cycle: Secondary | ICD-10-CM | POA: Insufficient documentation

## 2014-05-20 DIAGNOSIS — D649 Anemia, unspecified: Secondary | ICD-10-CM | POA: Insufficient documentation

## 2014-05-20 DIAGNOSIS — O479 False labor, unspecified: Secondary | ICD-10-CM | POA: Insufficient documentation

## 2014-05-20 DIAGNOSIS — O99891 Other specified diseases and conditions complicating pregnancy: Secondary | ICD-10-CM | POA: Insufficient documentation

## 2014-05-20 LAB — URINALYSIS, ROUTINE W REFLEX MICROSCOPIC
Bilirubin Urine: NEGATIVE
Glucose, UA: NEGATIVE mg/dL
Hgb urine dipstick: NEGATIVE
KETONES UR: 15 mg/dL — AB
NITRITE: NEGATIVE
PROTEIN: NEGATIVE mg/dL
Specific Gravity, Urine: 1.02 (ref 1.005–1.030)
UROBILINOGEN UA: 1 mg/dL (ref 0.0–1.0)
pH: 6.5 (ref 5.0–8.0)

## 2014-05-20 LAB — PROTEIN / CREATININE RATIO, URINE
Creatinine, Urine: 147.75 mg/dL
Protein Creatinine Ratio: 0.18 — ABNORMAL HIGH (ref 0.00–0.15)
Total Protein, Urine: 26 mg/dL

## 2014-05-20 LAB — URINE MICROSCOPIC-ADD ON

## 2014-05-20 LAB — COMPREHENSIVE METABOLIC PANEL
ALK PHOS: 191 U/L — AB (ref 39–117)
ALT: 7 U/L (ref 0–35)
AST: 12 U/L (ref 0–37)
Albumin: 2.3 g/dL — ABNORMAL LOW (ref 3.5–5.2)
BUN: 6 mg/dL (ref 6–23)
CO2: 22 mEq/L (ref 19–32)
Calcium: 8.5 mg/dL (ref 8.4–10.5)
Chloride: 102 mEq/L (ref 96–112)
Creatinine, Ser: 0.43 mg/dL — ABNORMAL LOW (ref 0.50–1.10)
GFR calc Af Amer: >90 mL/min (ref >90)
GFR calc non Af Amer: >90 mL/min (ref >90)
GLUCOSE: 97 mg/dL (ref 70–99)
POTASSIUM: 3.5 meq/L — AB (ref 3.7–5.3)
SODIUM: 136 meq/L — AB (ref 137–147)
TOTAL PROTEIN: 6.5 g/dL (ref 6.0–8.3)
Total Bilirubin: 0.3 mg/dL (ref 0.3–1.2)

## 2014-05-20 LAB — CBC
HEMATOCRIT: 28.8 % — AB (ref 36.0–46.0)
Hemoglobin: 8.8 g/dL — ABNORMAL LOW (ref 12.0–15.0)
MCH: 23.3 pg — ABNORMAL LOW (ref 26.0–34.0)
MCHC: 30.6 g/dL (ref 30.0–36.0)
MCV: 76.2 fL — ABNORMAL LOW (ref 78.0–100.0)
Platelets: 206 10*3/uL (ref 150–400)
RBC: 3.78 MIL/uL — ABNORMAL LOW (ref 3.87–5.11)
RDW: 15.8 % — AB (ref 11.5–15.5)
WBC: 10.4 10*3/uL (ref 4.0–10.5)

## 2014-05-20 LAB — LACTATE DEHYDROGENASE: LDH: 220 U/L (ref 94–250)

## 2014-05-20 LAB — TSH: TSH: 0.874 u[IU]/mL (ref 0.350–4.500)

## 2014-05-20 LAB — URIC ACID: Uric Acid, Serum: 3.9 mg/dL (ref 2.4–7.0)

## 2014-05-20 MED ORDER — OXYCODONE-ACETAMINOPHEN 5-325 MG PO TABS
2.0000 | ORAL_TABLET | Freq: Once | ORAL | Status: AC
Start: 2014-05-20 — End: 2014-05-20
  Administered 2014-05-20: 2 via ORAL
  Filled 2014-05-20: qty 2

## 2014-05-20 MED ORDER — IRON POLYSACCH CMPLX-B12-FA 150-0.025-1 MG PO CAPS: 1.0000 | ORAL_CAPSULE | Freq: Two times a day (BID) | ORAL | Status: DC

## 2014-05-20 MED ORDER — LACTATED RINGERS IV BOLUS (SEPSIS)
500.0000 mL | Freq: Once | INTRAVENOUS | Status: AC
  Administered 2014-05-20: 16:00:00 via INTRAVENOUS

## 2014-05-20 NOTE — MAU Note (Signed)
Pt states chest pain for past week and a half. Notes mainly at night when laying down. When she relaxes pain seems to go away. Thought pain could be from acid reflux. States she doesn't feel like this is cardiac related.

## 2014-05-20 NOTE — MAU Provider Note (Signed)
History   23yo, [id number] presents with c/o vaginal pressure and UCs that "go away when she gets to MAU."  Tachycardia was noted on admission, when pt was asked if she is experiencing a racing heart or palpitations, she reports feeling a racing heart, and that she has had chest pain for 1.5 weeks with SOB.  Worse at night, sometimes sharp, pt has been attributing it to anxiety or heartburn.  Denies VB, LOF, recent fever, resp or GI c/o's, UTI or PIH s/s. GFM.   Hx of pre-eclampsia with 1st pregnancy, ?CHF pp. Normal cardiac w/u. At discharge note stated: Presented with respiratory distress and signs of volume overload concerning for post-partum cardiomyopathy.  She was very hypertensive on admission and was aggressively diuresed with iv lasix and her BP was controlled with Coreg and enalapril.  A transthoracic echo revealed a normal EF of 50% with some diastolic dysfunction not consistent with cardiomyopathy.     Patient Active Problem List   Diagnosis Date Noted  . Second hand tobacco smoke exposure 03/09/2013  . S/P laparoscopic cholecystectomy 02/06/2013  . Hx of preeclampsia, prior pregnancy, currently pregnant 01/09/2013  . History of congestive heart failure 01/09/2013  . Mild persistent asthma 11/18/2012    Chief Complaint  Patient presents with  . Labor Eval   HPI  OB History   Grav Para Term Preterm Abortions TAB SAB Ect Mult Living   5 3 1 2 1  1   2      Obstetric Comments   2011 PIH;IOL; PP CHF-HOSPITALIZED X 1 WEEK      Past Medical History  Diagnosis Date  . Eczema   . CHF (congestive heart failure)     RESOLVED; HAD CARDIAC EVAL   . Congestive heart failure 2011    related to preeclampsia  . Hypothyroidism AGE 40     NO TX  . Pregnancy induced hypertension 2011  . Preterm labor 2011  . Infection     UTI OCC  . Infection 2013    [facility]  . Headache(784.0) 2011    MIGRAQINES; OTC  . Asthma 2009; [facility]NERSTONE    INHALER PRN  . Vaginal Pap smear, abnormal    colpo  . Hyperthyroidism     Past Surgical History  Procedure Laterality Date  . Tonsillectomy    . Wisdom tooth extraction    . Tear duct probing    . Cholecystectomy      Family History  Problem Relation Age of Onset  . Anesthesia problems Neg Hx   . Other Neg Hx   . Asthma Brother   . Learning disabilities Brother   . Asthma Daughter   . Learning disabilities Daughter   . Seizures Daughter   . Learning disabilities Other   . Colon cancer Maternal Grandmother   . Cancer Maternal Grandmother     colon    History  Substance Use Topics  . Smoking status: Never Smoker   . Smokeless tobacco: Never Used  . Alcohol Use: No    Allergies: No Known Allergies  Prescriptions prior to admission  Medication Sig Dispense Refill  . acetaminophen (TYLENOL) 325 MG tablet Take 650 mg by mouth every 6 (six) hours as needed for moderate pain.      . Prenatal Vit-Fe Fumarate-FA (PRENATAL MULTIVITAMIN) TABS tablet Take 1 tablet by mouth daily at 12 noon.      Marland Kitchen albuterol (PROVENTIL HFA;VENTOLIN HFA) 108 (90 BASE) MCG/ACT inhaler Inhale 2 puffs into the lungs every 6 (six) hours  as needed for wheezing.  1 Inhaler  4    ROS ROS: see HPI above, all other systems are negative  Physical Exam   Blood pressure 138/86, pulse 120, last menstrual period 05/07/2014, unknown if currently breastfeeding.  Physical Exam Chest: Clear Heart: RRR Abdomen: gravid, NT Extremities: WNL  Dilation: 3 Effacement (%): 50 Cervical Position: Middle Station: 0  SMO:[id number] NST UCs: None  ED Course  IUP at [redacted]w[redacted]d Vaginal pressure and occasional UCs Tachycardia Anemia - Hgb 8.8 / Hct 28.8  EKG - Abnormal T wave Labs IVF bolus Will recheck SVE before she leaves   Reporting off to on-coming CNM to follow-up care   Linda Hedges CNM, MSN 05/20/2014 3:50 PM

## 2014-05-20 NOTE — Progress Notes (Signed)
JOxley, CNM notified of pt's HR, orders obtained for EKG

## 2014-05-20 NOTE — Discharge Instructions (Signed)
Iron-Rich Diet An iron-rich diet contains foods that are good sources of iron. Iron is an important mineral that helps your body produce hemoglobin. Hemoglobin is a protein in red blood cells that carries oxygen to the body's tissues. Sometimes, the iron level in your blood can be low. This may be caused by:  A lack of iron in your diet.  Blood loss.  Times of growth, such as during pregnancy or during a child's growth and development. Low levels of iron can cause a decrease in the number of red blood cells. This can result in iron deficiency anemia. Iron deficiency anemia symptoms include:  Tiredness.  Weakness.  Irritability.  Increased chance of infection. Here are some recommendations for daily iron intake:  Males older than 24 years of age need 8 mg of iron per day.  Women ages 38 to 107 need 18 mg of iron per day.  Pregnant women need 27 mg of iron per day, and women who are over 58 years of age and breastfeeding need 9 mg of iron per day.  Women over the age of 64 need 8 mg of iron per day. SOURCES OF IRON There are 2 types of iron that are found in food: heme iron and nonheme iron. Heme iron is absorbed by the body better than nonheme iron. Heme iron is found in meat, poultry, and fish. Nonheme iron is found in grains, beans, and vegetables. Heme Iron Sources Food / Iron (mg)  Chicken liver, 3 oz (85 g)/ 10 mg  Beef liver, 3 oz (85 g)/ 5.5 mg  Oysters, 3 oz (85 g)/ 8 mg  Beef, 3 oz (85 g)/ 2 to 3 mg  Shrimp, 3 oz (85 g)/ 2.8 mg  [place], 3 oz (85 g)/ 2 mg  Chicken, 3 oz (85 g) / 1 mg  Fish (tuna, halibut), 3 oz (85 g)/ 1 mg  Pork, 3 oz (85 g)/ 0.9 mg Nonheme Iron Sources Food / Iron (mg)  Ready-to-eat breakfast cereal, iron-fortified / 3.9 to 7 mg  Tofu,  cup / 3.4 mg  Kidney beans,  cup / 2.6 mg  Baked potato with skin / 2.7 mg  Asparagus,  cup / 2.2 mg  Avocado / 2 mg  Dried peaches,  cup / 1.6 mg  Raisins,  cup / 1.5 mg  Soy milk, 1 cup  / 1.5 mg  Whole-wheat bread, 1 slice / 1.2 mg  Spinach, 1 cup / 0.8 mg  Broccoli,  cup / 0.6 mg IRON ABSORPTION Certain foods can decrease the body's absorption of iron. Try to avoid these foods and beverages while eating meals with iron-containing foods:  Coffee.  Tea.  Fiber.  Soy. Foods containing vitamin C can help increase the amount of iron your body absorbs from iron sources, especially from nonheme sources. Eat foods with vitamin C along with iron-containing foods to increase your iron absorption. Foods that are high in vitamin C include many fruits and vegetables. Some good sources are:  Fresh orange juice.  Oranges.  Strawberries.  Mangoes.  Grapefruit.  Red bell peppers.  Green bell peppers.  Broccoli.  Potatoes with skin.  Tomato juice. Document Released: 07/14/2005 Document Revised: 02/22/2012 Document Reviewed: 05/21/2011 [facility] Patient Information 2014 [place], [place].  Pregnancy and Anemia Anemia is a condition in which the concentration of red blood cells or hemoglobin in the blood is below normal. Hemoglobin is a substance in red blood cells that carries oxygen to the tissues of the body. Anemia results in not  enough oxygen reaching these tissues.  Anemia during pregnancy is common because the fetus uses more iron and folic acid as it is developing. Your body may not produce enough red blood cells because of this. Also, during pregnancy, the liquid part of the blood (plasma) increases by about 50%, and the red blood cells increase by only 25%. This lowers the concentration of the red blood cells and creates a natural anemia-like situation.  CAUSES  The most common cause of anemia during pregnancy is not having enough iron in the body to make red blood cells (iron deficiency anemia). Other causes may include:  Folic acid deficiency.  Vitamin B12 deficiency.  Certain prescription or over-the-counter medicines.  Certain medical conditions or  infections that destroy red blood cells.  A low platelet count and bleeding caused by antibodies that go through the placenta to the fetus from the mother's blood. SIGNS AND SYMPTOMS  Mild anemia may not be noticeable. If it becomes severe, symptoms may include:  Tiredness.  Shortness of breath, especially with exercise.  Weakness.  Fainting.  Pale looking skin.  Headaches.  Feeling a fast or irregular heartbeat (palpitations). DIAGNOSIS  The type of anemia is usually diagnosed from your family and medical history and blood tests. TREATMENT  Treatment of anemia during pregnancy depends on the cause of the anemia. Treatment can include:  Supplements of iron, vitamin H41, or folic acid.  A blood transfusion. This may be needed if blood loss is severe.  Hospitalization. This may be needed if there is significant continual blood loss.  Dietary changes. HOME CARE INSTRUCTIONS   Follow your dietitian's or health care provider's dietary recommendations.  Increase your vitamin C intake. This will help the stomach absorb more iron.  Eat a diet rich in iron. This would include foods such as:  Liver.  Beef.  Whole grain bread.  Eggs.  Dried fruit.  Take iron and vitamins as directed by your health care provider.  Eat green leafy vegetables. These are a good source of folic acid. SEEK MEDICAL CARE IF:   You have frequent or lasting headaches.  You are looking pale.  You are bruising easily. SEEK IMMEDIATE MEDICAL CARE IF:   You have extreme weakness, shortness of breath, or chest pain.  You become dizzy or have trouble concentrating.  You have heavy vaginal bleeding.  You develop a rash.  You have bloody or black, tarry stools.  You faint.  You vomit up blood.  You vomit repeatedly.  You have abdominal pain.  You have a fever or persistent symptoms for more than 2 3 days.  You have a fever and your symptoms suddenly get worse.  You are  dehydrated. MAKE SURE YOU:   Understand these instructions.  Will watch your condition.  Will get help right away if you are not doing well or get worse. Document Released: 11/27/2000 Document Revised: 09/20/2013 Document Reviewed: 07/12/2013 [facility] Patient Information 2014 [place].

## 2014-05-20 NOTE — MAU Note (Signed)
Pt presents to MAU with complaints of contractions that started yesterday but have not got in a regular patten. She is having vaginal pressure

## 2014-05-20 NOTE — Progress Notes (Signed)
S: Pt c/o RLP, denies ctx, requesting VE and ready for discharge, denies any CP or SOB  O: VSS, HR about 100's VE no change Results for orders placed during the hospital encounter of 05/20/14 (from the past 24 hour(s))  URINALYSIS, ROUTINE W REFLEX MICROSCOPIC     Status: Abnormal   Collection Time    05/20/14  2:00 PM      Result Value Ref Range   Color, Urine YELLOW  YELLOW   APPearance CLEAR  CLEAR   Specific Gravity, Urine 1.020  1.005 - 1.030   pH 6.5  5.0 - 8.0   Glucose, UA NEGATIVE  NEGATIVE mg/dL   Hgb urine dipstick NEGATIVE  NEGATIVE   Bilirubin Urine NEGATIVE  NEGATIVE   Ketones, ur 15 (*) NEGATIVE mg/dL   Protein, ur NEGATIVE  NEGATIVE mg/dL   Urobilinogen, UA 1.0  0.0 - 1.0 mg/dL   Nitrite NEGATIVE  NEGATIVE   Leukocytes, UA TRACE (*) NEGATIVE  URINE MICROSCOPIC-ADD ON     Status: Abnormal   Collection Time    05/20/14  2:00 PM      Result Value Ref Range   Squamous Epithelial / LPF FEW (*) RARE   WBC, UA 3-6  <3 WBC/hpf   RBC / HPF 0-2  <3 RBC/hpf   Bacteria, UA FEW (*) RARE  PROTEIN / CREATININE RATIO, URINE     Status: Abnormal   Collection Time    05/20/14  2:00 PM      Result Value Ref Range   Creatinine, Urine 147.75     Total Protein, Urine 26     PROTEIN CREATININE RATIO 0.18 (*) 0.00 - 0.15  COMPREHENSIVE METABOLIC PANEL     Status: Abnormal   Collection Time    05/20/14  3:49 PM      Result Value Ref Range   Sodium 136 (*) 137 - 147 mEq/L   Potassium 3.5 (*) 3.7 - 5.3 mEq/L   Chloride 102  96 - 112 mEq/L   CO2 22  19 - 32 mEq/L   Glucose, Bld 97  70 - 99 mg/dL   BUN 6  6 - 23 mg/dL   Creatinine, Ser 0.43 (*) 0.50 - 1.10 mg/dL   Calcium 8.5  8.4 - 10.5 mg/dL   Total Protein 6.5  6.0 - 8.3 g/dL   Albumin 2.3 (*) 3.5 - 5.2 g/dL   AST 12  0 - 37 U/L   ALT 7  0 - 35 U/L   Alkaline Phosphatase 191 (*) 39 - 117 U/L   Total Bilirubin 0.3  0.3 - 1.2 mg/dL   GFR calc non Af Amer >90  >90 mL/min   GFR calc Af Amer >90  >90 mL/min  CBC     Status:  Abnormal   Collection Time    05/20/14  3:49 PM      Result Value Ref Range   WBC 10.4  4.0 - 10.5 K/uL   RBC 3.78 (*) 3.87 - 5.11 MIL/uL   Hemoglobin 8.8 (*) 12.0 - 15.0 g/dL   HCT 28.8 (*) 36.0 - 46.0 %   MCV 76.2 (*) 78.0 - 100.0 fL   MCH 23.3 (*) 26.0 - 34.0 pg   MCHC 30.6  30.0 - 36.0 g/dL   RDW 15.8 (*) 11.5 - 15.5 %   Platelets 206  150 - 400 K/uL  URIC ACID     Status: None   Collection Time    05/20/14  3:49 PM  Result Value Ref Range   Uric Acid, Serum 3.9  2.4 - 7.0 mg/dL  LACTATE DEHYDROGENASE     Status: None   Collection Time    05/20/14  3:49 PM      Result Value Ref Range   LDH 220  94 - 250 U/L  TSH     Status: None   Collection Time    05/20/14  7:50 PM      Result Value Ref Range   TSH 0.874  0.350 - 4.500 uIU/mL     A: IUP at [redacted]w[redacted]d PIH labs normal Anemia Not in labor EKG reviewed by DR Wynonia Lawman w Cardiology, and states no further recommendations or w/u needed, EKG similar to previous ones.  P: dc home,  Percocet 2 tabs PO now rv'd anemia, RX niferex to pharmacy, iron-rich diet Has f/u office visit Tuesday rv'd [place] and labor sx's  S.Nahmir Zeidman, CNM

## 2014-05-20 NOTE — MAU Note (Signed)
Butch Penny, RT called for EKG

## 2014-05-21 ENCOUNTER — Inpatient Hospital Stay (HOSPITAL_COMMUNITY)
Admission: AD | Admit: 2014-05-21 | Discharge: 2014-05-23 | DRG: 774 | Disposition: A | Discharge status: Home / Self Care | Payer: Medicaid Other | Source: Ambulatory Visit | Attending: Obstetrics and Gynecology | Admitting: Obstetrics and Gynecology

## 2014-05-21 ENCOUNTER — Encounter (HOSPITAL_COMMUNITY): Payer: Self-pay

## 2014-05-21 DIAGNOSIS — D649 Anemia, unspecified: Secondary | ICD-10-CM | POA: Diagnosis present

## 2014-05-21 DIAGNOSIS — O9902 Anemia complicating childbirth: Secondary | ICD-10-CM | POA: Diagnosis present

## 2014-05-21 DIAGNOSIS — E669 Obesity, unspecified: Secondary | ICD-10-CM | POA: Diagnosis present

## 2014-05-21 DIAGNOSIS — O4290 Premature rupture of membranes, unspecified as to length of time between rupture and onset of labor, unspecified weeks of gestation: Principal | ICD-10-CM | POA: Diagnosis present

## 2014-05-21 DIAGNOSIS — Z6839 Body mass index (BMI) 39.0-39.9, adult: Secondary | ICD-10-CM

## 2014-05-21 DIAGNOSIS — O1002 Pre-existing essential hypertension complicating childbirth: Secondary | ICD-10-CM | POA: Diagnosis present

## 2014-05-21 DIAGNOSIS — O99214 Obesity complicating childbirth: Secondary | ICD-10-CM

## 2014-05-21 DIAGNOSIS — Z800 Family history of malignant neoplasm of digestive organs: Secondary | ICD-10-CM

## 2014-05-21 LAB — CBC
HEMATOCRIT: 29.2 % — AB (ref 36.0–46.0)
HEMOGLOBIN: 9.2 g/dL — AB (ref 12.0–15.0)
MCH: 24.1 pg — AB (ref 26.0–34.0)
MCHC: 31.5 g/dL (ref 30.0–36.0)
MCV: 76.6 fL — ABNORMAL LOW (ref 78.0–100.0)
Platelets: 212 10*3/uL (ref 150–400)
RBC: 3.81 MIL/uL — ABNORMAL LOW (ref 3.87–5.11)
RDW: 16.1 % — ABNORMAL HIGH (ref 11.5–15.5)
WBC: 9.6 10*3/uL (ref 4.0–10.5)

## 2014-05-21 LAB — AMNISURE RUPTURE OF MEMBRANE (ROM) NOT AT ARMC: AMNISURE: POSITIVE

## 2014-05-21 LAB — TYPE AND SCREEN
ABO/RH(D): A POS
Antibody Screen: NEGATIVE

## 2014-05-21 MED ORDER — LACTATED RINGERS IV SOLN: 500.0000 mL | INTRAVENOUS | Status: DC | PRN

## 2014-05-21 MED ORDER — OXYTOCIN BOLUS FROM INFUSION
500.0000 mL | INTRAVENOUS | Status: DC
  Administered 2014-05-22: 500 mL via INTRAVENOUS

## 2014-05-21 MED ORDER — LACTATED RINGERS IV SOLN
INTRAVENOUS | Status: DC
  Administered 2014-05-21 – 2014-05-22 (×2): via INTRAVENOUS

## 2014-05-21 MED ORDER — LIDOCAINE HCL (PF) 1 % IJ SOLN
30.0000 mL | INTRAMUSCULAR | Status: DC | PRN
  Filled 2014-05-21: qty 30

## 2014-05-21 MED ORDER — IBUPROFEN 600 MG PO TABS: 600.0000 mg | ORAL_TABLET | Freq: Four times a day (QID) | ORAL | Status: DC | PRN

## 2014-05-21 MED ORDER — OXYCODONE-ACETAMINOPHEN 5-325 MG PO TABS: 1.0000 | ORAL_TABLET | ORAL | Status: DC | PRN

## 2014-05-21 MED ORDER — ONDANSETRON HCL 4 MG/2ML IJ SOLN: 4.0000 mg | Freq: Four times a day (QID) | INTRAMUSCULAR | Status: DC | PRN

## 2014-05-21 MED ORDER — OXYTOCIN 40 UNITS IN LACTATED RINGERS INFUSION - SIMPLE MED
62.5000 mL/h | INTRAVENOUS | Status: DC
  Filled 2014-05-21: qty 1000

## 2014-05-21 MED ORDER — CITRIC ACID-SODIUM CITRATE 334-500 MG/5ML PO SOLN: 30.0000 mL | ORAL | Status: DC | PRN

## 2014-05-21 MED ORDER — ACETAMINOPHEN 325 MG PO TABS: 650.0000 mg | ORAL_TABLET | ORAL | Status: DC | PRN

## 2014-05-21 NOTE — MAU Provider Note (Signed)
History   23 yo [id number] @ term presents to MAU w/ c/o LOF since last night. Noticed more leaking since 0800 today. Reports active fetus. Denies VB or ctxs.  Patient Active Problem List   Diagnosis Date Noted  . Second hand tobacco smoke exposure 03/09/2013  . S/P laparoscopic cholecystectomy 02/06/2013  . Hx of preeclampsia, prior pregnancy, currently pregnant 01/09/2013  . History of congestive heart failure 01/09/2013  . Mild persistent asthma 11/18/2012    Chief Complaint  Patient presents with  . Rupture of Membranes   HPI See above OB History   Grav Para Term Preterm Abortions TAB SAB Ect Mult Living   4 2 1 1 1  1   2      Obstetric Comments   2011 PIH;IOL; PP CHF-HOSPITALIZED X 1 WEEK      Past Medical History  Diagnosis Date  . Eczema   . CHF (congestive heart failure)     RESOLVED; HAD CARDIAC EVAL   . Congestive heart failure 2011    related to preeclampsia  . Hypothyroidism AGE 39     NO TX  . Pregnancy induced hypertension 2011  . Preterm labor 2011  . Infection     UTI OCC  . Infection 2013    [place]  . Headache(784.0) 2011    MIGRAQINES; OTC  . Asthma 2009; [facility]ONE    INHALER PRN  . Vaginal Pap smear, abnormal     colpo  . Hyperthyroidism     Past Surgical History  Procedure Laterality Date  . Tonsillectomy    . Wisdom tooth extraction    . Tear duct probing    . Cholecystectomy      Family History  Problem Relation Age of Onset  . Anesthesia problems Neg Hx   . Other Neg Hx   . Asthma Brother   . Learning disabilities Brother   . Asthma Daughter   . Learning disabilities Daughter   . Seizures Daughter   . Learning disabilities Other   . Colon cancer Maternal Grandmother   . Cancer Maternal Grandmother     colon    History  Substance Use Topics  . Smoking status: Never Smoker   . Smokeless tobacco: Never Used  . Alcohol Use: No    Allergies: No Known Allergies  Prescriptions prior to admission  Medication Sig Dispense  Refill  . acetaminophen (TYLENOL) 325 MG tablet Take 650 mg by mouth every 6 (six) hours as needed for moderate pain.      . Prenatal Vit-Fe Fumarate-FA (PRENATAL MULTIVITAMIN) TABS tablet Take 1 tablet by mouth daily at 12 noon.      Marland Kitchen albuterol (PROVENTIL HFA;VENTOLIN HFA) 108 (90 BASE) MCG/ACT inhaler Inhale 2 puffs into the lungs every 6 (six) hours as needed for wheezing.  1 Inhaler  4  . Iron Polysacch Cmplx-B12-FA 150-0.025-1 MG CAPS Take 1 tablet by mouth 2 (two) times daily.  60 each  2    ROS LOF Vaginal pressure Physical Exam   Blood pressure 127/85, pulse 112, temperature 98.3 F (36.8 C), temperature source Oral, resp. rate 16, weight 197 lb (89.359 kg), SpO2 99.00%, unknown if currently breastfeeding.  Physical Exam Discharge suspicious for +LOF. Amnisure obtained and positive FHR: Cat 1 Toco: Rare uc Cervix: 3/50/-3 ED Course  Assessment: SROM confirmed by amnisure  Plan: Pt for admission Report given to oncoming CNM   Farrel Gordon CNM, MSN 05/21/2014 10:35 PM

## 2014-05-21 NOTE — MAU Note (Signed)
Patient states she a little leaking last night but has been leaking clear fluid since about 0800 this morning. Denies contractions but does have abdominal pressure. Reports good fetal movement.

## 2014-05-21 NOTE — H&P (Signed)
Brenda Spencer is a 23 y.o. female, [phone] at 38.6 weeks, presenting for rupture of membranes. Patient states that prior to going to bed last night she was having some vaginal leakage.  Upon waking patient noted "gush of fluid," which occurred around 8am.  Patient denies ctx, vb, and reports active fetus.     Patient Active Problem List   Diagnosis Date Noted  . Second hand tobacco smoke exposure 03/09/2013  . S/P laparoscopic cholecystectomy 02/06/2013  . Hx of preeclampsia, prior pregnancy, currently pregnant 01/09/2013  . History of congestive heart failure 01/09/2013  . Mild persistent asthma 11/18/2012    History of present pregnancy: Patient entered care at 11.6 weeks.   EDC of 05/30/2014 was established by Definite LMP of 08/22/2013.   Anatomy scan:  Limited US at18 weeks, with normal findings and an anterior placenta.   Additional [place] evaluations:  Complete Anatomy US at 20wks with no significant findings.   Significant prenatal events:  Patient had complaints of vaginal pressure/pain throughout the pregnancy.  She also struggled with headaches, back pain, abdominal pain, and a consistent non-productive cough Last evaluation: 3/50/-3 05/20/2014 in MAU by J.Rochele Raring, CNM  OB History   Grav Para Term Preterm Abortions TAB SAB Ect Mult Living   4 2 1 1 1  1   2      Obstetric Comments   2011 PIH;IOL; PP CHF-HOSPITALIZED X 1 WEEK     Past Medical History  Diagnosis Date  . Eczema   . CHF (congestive heart failure)     RESOLVED; HAD CARDIAC EVAL   . Congestive heart failure 2011    related to preeclampsia  . Hypothyroidism AGE 3     NO TX  . Pregnancy induced hypertension 2011  . Preterm labor 2011  . Infection     UTI OCC  . Infection 2013    [place]  . Headache(784.0) 2011    MIGRAQINES; OTC  . Asthma 2009; [facility]    INHALER PRN  . Vaginal Pap smear, abnormal     colpo  . Hyperthyroidism    Past Surgical History  Procedure Laterality Date  . Tonsillectomy    .  Wisdom tooth extraction    . Tear duct probing    . Cholecystectomy     Family History: family history includes Asthma in her brother and daughter; Cancer in her maternal grandmother; Colon cancer in her maternal grandmother; Learning disabilities in her brother, daughter, and other; Seizures in her daughter. There is no history of Anesthesia problems or Other. Social History:  reports that she has never smoked. She has never used smokeless tobacco. She reports that she does not drink alcohol or use illicit drugs.   Prenatal Transfer Tool  Maternal Diabetes: No Genetic Screening: Normal Maternal Ultrasounds/Referrals: Normal Fetal Ultrasounds or other Referrals:  None Maternal Substance Abuse:  No Significant Maternal Medications:  None Significant Maternal Lab Results: Lab values include: Group B Strep negative    ROS:  See HPI Above  No Known Allergies   Chest clear Heart RRR without murmur Abd gravid, NT Pelvic:  4-5/50/-1, Midposition, Soft, Blood show--IUPC inserted w/o difficulty Ext: WNL  FHR: 135 bpm, Mod Var, -Decels, +Accels UCs:  None graphed or palpated  Prenatal labs: ABO, Rh:   A Positive Antibody:  Negative Rubella:   Immune RPR:   Negative HBsAg:   Negative HIV:   Negative GBS:  Negative N Sickle cell/Hgb electrophoresis:  N/A Pap:  Abnormal with Normal Repeat GC:  Negative  Chlamydia:  Negative Genetic screenings:  Negative Glucola:  Positive 1hr-Negative 3hr Other:  TSH-WNL    Assessment IUP at 38.6wks PROM GBS Negative Cat I FT  Plan: Admit to [facility] per consult with Dr. Scotty Court Routine Labor and Delivery Orders Augmentation Orders IUPC inserted  Okay for Epidural  Ardeen Jourdain, MSN 05/21/2014, 8:22 PM

## 2014-05-22 ENCOUNTER — Inpatient Hospital Stay (HOSPITAL_COMMUNITY): Payer: Medicaid Other | Admitting: Anesthesiology

## 2014-05-22 ENCOUNTER — Encounter (HOSPITAL_COMMUNITY): Payer: Medicaid Other | Admitting: Anesthesiology

## 2014-05-22 ENCOUNTER — Encounter (HOSPITAL_COMMUNITY): Payer: Self-pay | Admitting: Obstetrics

## 2014-05-22 LAB — COMPREHENSIVE METABOLIC PANEL
ALBUMIN: 2.4 g/dL — AB (ref 3.5–5.2)
ALT: 8 U/L (ref 0–35)
AST: 12 U/L (ref 0–37)
Alkaline Phosphatase: 206 U/L — ABNORMAL HIGH (ref 39–117)
BUN: 5 mg/dL — ABNORMAL LOW (ref 6–23)
CO2: 20 meq/L (ref 19–32)
CREATININE: 0.43 mg/dL — AB (ref 0.50–1.10)
Calcium: 8.6 mg/dL (ref 8.4–10.5)
Chloride: 101 mEq/L (ref 96–112)
GFR calc Af Amer: >90 mL/min (ref >90)
Glucose, Bld: 73 mg/dL (ref 70–99)
Potassium: 3.7 mEq/L (ref 3.7–5.3)
SODIUM: 137 meq/L (ref 137–147)
TOTAL PROTEIN: 6.3 g/dL (ref 6.0–8.3)
Total Bilirubin: 0.4 mg/dL (ref 0.3–1.2)

## 2014-05-22 LAB — CBC
HEMATOCRIT: 27.2 % — AB (ref 36.0–46.0)
HEMOGLOBIN: 8.3 g/dL — AB (ref 12.0–15.0)
MCH: 23.6 pg — ABNORMAL LOW (ref 26.0–34.0)
MCHC: 30.5 g/dL (ref 30.0–36.0)
MCV: 77.3 fL — ABNORMAL LOW (ref 78.0–100.0)
Platelets: 202 10*3/uL (ref 150–400)
RBC: 3.52 MIL/uL — ABNORMAL LOW (ref 3.87–5.11)
RDW: 16.1 % — AB (ref 11.5–15.5)
WBC: 13.7 10*3/uL — AB (ref 4.0–10.5)

## 2014-05-22 LAB — URIC ACID: Uric Acid, Serum: 3.9 mg/dL (ref 2.4–7.0)

## 2014-05-22 LAB — RPR

## 2014-05-22 MED ORDER — DIPHENHYDRAMINE HCL 25 MG PO CAPS: 25.0000 mg | ORAL_CAPSULE | Freq: Four times a day (QID) | ORAL | Status: DC | PRN

## 2014-05-22 MED ORDER — TETANUS-DIPHTH-ACELL PERTUSSIS 5-2.5-18.5 LF-MCG/0.5 IM SUSP: 0.5000 mL | Freq: Once | INTRAMUSCULAR | Status: DC

## 2014-05-22 MED ORDER — OXYTOCIN 40 UNITS IN LACTATED RINGERS INFUSION - SIMPLE MED
1.0000 m[IU]/min | INTRAVENOUS | Status: DC
  Administered 2014-05-22: 2 m[IU]/min via INTRAVENOUS

## 2014-05-22 MED ORDER — ONDANSETRON HCL 4 MG/2ML IJ SOLN: 4.0000 mg | INTRAMUSCULAR | Status: DC | PRN

## 2014-05-22 MED ORDER — IBUPROFEN 600 MG PO TABS
600.0000 mg | ORAL_TABLET | Freq: Four times a day (QID) | ORAL | Status: DC
  Administered 2014-05-22 – 2014-05-23 (×5): 600 mg via ORAL
  Filled 2014-05-22 (×5): qty 1

## 2014-05-22 MED ORDER — TERBUTALINE SULFATE 1 MG/ML IJ SOLN: 0.2500 mg | Freq: Once | INTRAMUSCULAR | Status: DC | PRN

## 2014-05-22 MED ORDER — SENNOSIDES-DOCUSATE SODIUM 8.6-50 MG PO TABS
2.0000 | ORAL_TABLET | ORAL | Status: DC
  Administered 2014-05-23: 2 via ORAL
  Filled 2014-05-22: qty 2

## 2014-05-22 MED ORDER — EPHEDRINE 5 MG/ML INJ
10.0000 mg | INTRAVENOUS | Status: DC | PRN
  Filled 2014-05-22: qty 2
  Filled 2014-05-22: qty 4

## 2014-05-22 MED ORDER — SIMETHICONE 80 MG PO CHEW: 80.0000 mg | CHEWABLE_TABLET | ORAL | Status: DC | PRN

## 2014-05-22 MED ORDER — PRENATAL MULTIVITAMIN CH
1.0000 | ORAL_TABLET | Freq: Every day | ORAL | Status: DC
  Administered 2014-05-22: 1 via ORAL
  Filled 2014-05-22: qty 1

## 2014-05-22 MED ORDER — LIDOCAINE HCL (PF) 1 % IJ SOLN
INTRAMUSCULAR | Status: DC | PRN
  Administered 2014-05-22: 2 mL
  Administered 2014-05-22 (×3): 4 mL

## 2014-05-22 MED ORDER — EPHEDRINE 5 MG/ML INJ
10.0000 mg | INTRAVENOUS | Status: DC | PRN
  Filled 2014-05-22: qty 2

## 2014-05-22 MED ORDER — OXYCODONE-ACETAMINOPHEN 5-325 MG PO TABS
1.0000 | ORAL_TABLET | ORAL | Status: DC | PRN
  Administered 2014-05-22 – 2014-05-23 (×4): 2 via ORAL
  Filled 2014-05-22 (×4): qty 2

## 2014-05-22 MED ORDER — PHENYLEPHRINE 40 MCG/ML (10ML) SYRINGE FOR IV PUSH (FOR BLOOD PRESSURE SUPPORT)
80.0000 ug | PREFILLED_SYRINGE | INTRAVENOUS | Status: DC | PRN
  Filled 2014-05-22: qty 2
  Filled 2014-05-22: qty 10

## 2014-05-22 MED ORDER — PHENYLEPHRINE 40 MCG/ML (10ML) SYRINGE FOR IV PUSH (FOR BLOOD PRESSURE SUPPORT)
80.0000 ug | PREFILLED_SYRINGE | INTRAVENOUS | Status: DC | PRN
  Filled 2014-05-22: qty 2

## 2014-05-22 MED ORDER — BENZOCAINE-MENTHOL 20-0.5 % EX AERO: 1.0000 "application " | INHALATION_SPRAY | CUTANEOUS | Status: DC | PRN

## 2014-05-22 MED ORDER — DIPHENHYDRAMINE HCL 50 MG/ML IJ SOLN: 12.5000 mg | INTRAMUSCULAR | Status: DC | PRN

## 2014-05-22 MED ORDER — ZOLPIDEM TARTRATE 5 MG PO TABS: 5.0000 mg | ORAL_TABLET | Freq: Every evening | ORAL | Status: DC | PRN

## 2014-05-22 MED ORDER — ONDANSETRON HCL 4 MG PO TABS: 4.0000 mg | ORAL_TABLET | ORAL | Status: DC | PRN

## 2014-05-22 MED ORDER — FENTANYL 2.5 MCG/ML BUPIVACAINE 1/10 % EPIDURAL INFUSION (WH - ANES)
14.0000 mL/h | INTRAMUSCULAR | Status: DC | PRN
  Administered 2014-05-22: 14 mL/h via EPIDURAL
  Filled 2014-05-22: qty 125

## 2014-05-22 MED ORDER — LANOLIN HYDROUS EX OINT: TOPICAL_OINTMENT | CUTANEOUS | Status: DC | PRN

## 2014-05-22 MED ORDER — WITCH HAZEL-GLYCERIN EX PADS: 1.0000 "application " | MEDICATED_PAD | CUTANEOUS | Status: DC | PRN

## 2014-05-22 MED ORDER — LACTATED RINGERS IV SOLN
500.0000 mL | Freq: Once | INTRAVENOUS | Status: AC
  Administered 2014-05-22: 500 mL via INTRAVENOUS

## 2014-05-22 MED ORDER — DIBUCAINE 1 % RE OINT: 1.0000 "application " | TOPICAL_OINTMENT | RECTAL | Status: DC | PRN

## 2014-05-22 NOTE — Lactation Note (Signed)
This note was copied from the chart of Brenda [employer] Lactation Consultation Note  Patient Name: Brenda Spencer [id number] Date: 05/22/2014 Reason for consult: Other (Comment) (charting for exclusion)   Maternal Data Formula Feeding for Exclusion: Yes Reason for exclusion: Mother's choice to formula feed on admision  Feeding    LATCH Score/Interventions                      Lactation Tools Discussed/Used     Consult Status Consult Status: Complete    Landis Gandy 05/22/2014, 7:15 PM

## 2014-05-22 NOTE — Anesthesia Postprocedure Evaluation (Signed)
  Anesthesia Post-op Note  Anesthesia Post Note  Patient: Brenda Spencer  Procedure(s) Performed: * No procedures listed *  Anesthesia type: Epidural  Patient location: Mother/Baby  Post pain: Pain level controlled  Post assessment: Post-op Vital signs reviewed  Last Vitals:  Filed Vitals:   05/22/14 0930  BP: 99/67  Pulse: 90  Temp: 36.7 C  Resp: 20    Post vital signs: Reviewed  Level of consciousness:alert  Complications: No apparent anesthesia complications

## 2014-05-22 NOTE — Anesthesia Preprocedure Evaluation (Addendum)
Anesthesia Evaluation  Patient identified by MRN, date of birth, ID band Patient awake    Reviewed: Allergy & Precautions, H&P , NPO status , Patient's Chart, lab work & pertinent test results, reviewed documented beta blocker date and time   History of Anesthesia Complications Negative for: history of anesthetic complications  Airway Mallampati: II TM Distance: >3 FB Neck ROM: full    Dental  (+) Teeth Intact   Pulmonary asthma ,  breath sounds clear to auscultation        Cardiovascular hypertension (HTN today.  h/o preeclampsia in first pregnancy), +CHF (H/o preeclampsia in first pregnancy with PP CHF that resolved.  Reportedly negative cardiac w/u, although I do not find it in the chart.) Rhythm:regular Rate:Normal + Systolic murmurs Denies CP/SOB or any symptoms that she had with CHF    Neuro/Psych  Headaches (migraines - saw neuro in 1/15), negative psych ROS   GI/Hepatic negative GI ROS, Neg liver ROS,   Endo/Other  Hypothyroidism Morbid obesity  Renal/GU negative Renal ROS     Musculoskeletal   Abdominal   Peds  Hematology  (+) anemia ,   Anesthesia Other Findings   Reproductive/Obstetrics (+) Pregnancy                          Anesthesia Physical Anesthesia Plan  ASA: III  Anesthesia Plan: Epidural   Post-op Pain Management:    Induction:   Airway Management Planned:   Additional Equipment:   Intra-op Plan:   Post-operative Plan:   Informed Consent: I have reviewed the patients History and Physical, chart, labs and discussed the procedure including the risks, benefits and alternatives for the proposed anesthesia with the patient or authorized representative who has indicated his/her understanding and acceptance.     Plan Discussed with:   Anesthesia Plan Comments:         Anesthesia Quick Evaluation

## 2014-05-22 NOTE — Anesthesia Procedure Notes (Signed)
Epidural Patient location during procedure: OB Start time: 05/22/2014 1:42 AM  Staffing Performed by: anesthesiologist   Preanesthetic Checklist Completed: patient identified, site marked, surgical consent, pre-op evaluation, timeout performed, IV checked, risks and benefits discussed and monitors and equipment checked  Epidural Patient position: sitting Prep: site prepped and draped and DuraPrep Patient monitoring: continuous pulse ox and blood pressure Approach: midline Injection technique: LOR air  Needle:  Needle type: Tuohy  Needle gauge: 17 G Needle length: 9 cm and 9 Needle insertion depth: 7 cm Catheter type: closed end flexible Catheter size: 19 Gauge Catheter at skin depth: 12 cm Test dose: negative  Assessment Events: blood not aspirated, injection not painful, no injection resistance, negative IV test and no paresthesia  Additional Notes Discussed risk of headache, infection, bleeding, nerve injury and failed or incomplete block.  Patient voices understanding and wishes to proceed.  Epidural placed easily on first attempt.  No paresthesia.  Patient tolerated procedure well with no apparent complications.  Charlton Haws, MDReason for block:procedure for pain

## 2014-05-23 MED ORDER — IBUPROFEN 600 MG PO TABS: 600.0000 mg | ORAL_TABLET | Freq: Four times a day (QID) | ORAL | Status: DC | PRN

## 2014-05-23 NOTE — Progress Notes (Signed)
Ur chart review completed.  

## 2014-05-23 NOTE — Discharge Summary (Signed)
Vaginal Delivery Discharge Summary  Brenda Spencer                             Female infant "Brenda Spencer", office circ, desires pp tubal  DOB:    22-Jun-1991 MRN:    [id number] CSN:    [id number]  Date of admission:                  05/21/14  Date of discharge:                   05/23/14  Procedures this admission:   SVD  Date of Delivery: 05/22/14  Newborn Data:  Live born female  Birth Weight: 7 lb 2.8 oz (3255 g) APGAR: 8, 9  Home with mother. Name: Brenda Spencer Circumcision Plan: Outpatient  History of Present Illness:  Ms. KATELAND LEISINGER is a 23 y.o. female, [phone], who presents at [redacted]w[redacted]d weeks gestation. The patient has been followed at the [facility] and Gynecology division of [facility]. She was admitted rupture of membranes. Her pregnancy has been complicated by:  Patient Active Problem List   Diagnosis Date Noted  . Second hand tobacco smoke exposure 03/09/2013  . S/P laparoscopic cholecystectomy 02/06/2013  . History of congestive heart failure 01/09/2013  . Mild persistent asthma 11/18/2012  Anemia  Hx of pre-eclampsia with 1st pregnancy, ?CHF pp. Normal cardiac w/u.  At discharge note stated:  Presented with respiratory distress and signs of volume overload concerning for post-partum cardiomyopathy. She was very hypertensive on admission and was aggressively diuresed with iv lasix and her BP was controlled with Coreg and enalapril. A transthoracic echo revealed a normal EF of 50% with some diastolic dysfunction not consistent with cardiomyopathy.    Hospital Course:  Pt was admitted on 05/21/14 with ROM at 38.6 wks. Negative GBS. Utilized Epidural for pain management.  Delivery was performed by Gavin Pound, CNM with loose nuchal cord x 1. Patient and baby tolerated the procedure without difficulty, with clitoral tear noted. Tear was hemostatic. Infant status was stable and remained in room with mother.  Mother and infant  then had an uncomplicated postpartum course, with bottlefeeding going well. Mom's physical exam was WNL, and she was discharged home in stable condition. Contraception plan was pp tubal, nothing in the meantime.  She received adequate benefit from po pain medications.   Feeding:  bottle  Contraception:  bilateral tubal ligation  Discharge hemoglobin:  Hemoglobin  Date Value Ref Range Status  05/22/2014 8.3* 12.0 - 15.0 g/dL Final  09/28/2012 12.5   Final     HCT  Date Value Ref Range Status  05/22/2014 27.2* 36.0 - 46.0 % Final  09/28/2012 37   Final    Discharge Physical Exam:   General: alert Lochia: appropriate Uterine Fundus: firm Incision: healing well DVT Evaluation: No evidence of DVT seen on physical exam. Negative Homan's sign.  Intrapartum Procedures: spontaneous vaginal delivery Postpartum Procedures: none Complications-Operative and Postpartum: Clitoral tear-unrepaired  Discharge Diagnoses: Term Pregnancy-delivered, Anemia-hemodynamically stable  Discharge Information:  Activity:           pelvic rest Diet:                routine Medications: PNV, Ibuprofen, Iron and Tylenol Condition:      stable     Discharge to: home  Follow-up Information   Follow up with [facility]. Schedule an appointment  as soon as possible for a visit in 6 weeks. (Per [employer]. Call with questions or concerns.)    Specialty:  Obstetrics and Gynecology   Contact information:   [address] [place] [address] [phone]       Farrel Gordon CNM, MS 05/23/2014 10:08 AM

## 2014-05-23 NOTE — Discharge Instructions (Signed)
Contraception Choices Contraception (birth control) is the use of any methods or devices to prevent pregnancy. Below are some methods to help avoid pregnancy. HORMONAL METHODS   Contraceptive implant This is a thin, plastic tube containing progesterone hormone. It does not contain estrogen hormone. Your health care provider inserts the tube in the inner part of the upper arm. The tube can remain in place for up to 3 years. After 3 years, the implant must be removed. The implant prevents the ovaries from releasing an egg (ovulation), thickens the cervical mucus to prevent sperm from entering the uterus, and thins the lining of the inside of the uterus.  Progesterone-only injections These injections are given every 3 months by your health care provider to prevent pregnancy. This synthetic progesterone hormone stops the ovaries from releasing eggs. It also thickens cervical mucus and changes the uterine lining. This makes it harder for sperm to survive in the uterus.  Birth control pills These pills contain estrogen and progesterone hormone. They work by preventing the ovaries from releasing eggs (ovulation). They also cause the cervical mucus to thicken, preventing the sperm from entering the uterus. Birth control pills are prescribed by a health care provider.Birth control pills can also be used to treat heavy periods.  Minipill This type of birth control pill contains only the progesterone hormone. They are taken every day of each month and must be prescribed by your health care provider.  Birth control patch The patch contains hormones similar to those in birth control pills. It must be changed once a week and is prescribed by a health care provider.  Vaginal ring The ring contains hormones similar to those in birth control pills. It is left in the vagina for 3 weeks, removed for 1 week, and then a new one is put back in place. The patient must be comfortable inserting and removing the ring from the  vagina.A health care provider's prescription is necessary.  Emergency contraception Emergency contraceptives prevent pregnancy after unprotected sexual intercourse. This pill can be taken right after sex or up to 5 days after unprotected sex. It is most effective the sooner you take the pills after having sexual intercourse. Most emergency contraceptive pills are available without a prescription. Check with your pharmacist. Do not use emergency contraception as your only form of birth control. BARRIER METHODS   Female condom This is a thin sheath (latex or rubber) that is worn over the penis during sexual intercourse. It can be used with spermicide to increase effectiveness.  Female condom. This is a soft, loose-fitting sheath that is put into the vagina before sexual intercourse.  Diaphragm This is a soft, latex, dome-shaped barrier that must be fitted by a health care provider. It is inserted into the vagina, along with a spermicidal jelly. It is inserted before intercourse. The diaphragm should be left in the vagina for 6 to 8 hours after intercourse.  Cervical cap This is a round, soft, latex or plastic cup that fits over the cervix and must be fitted by a health care provider. The cap can be left in place for up to 48 hours after intercourse.  Sponge This is a soft, circular piece of polyurethane foam. The sponge has spermicide in it. It is inserted into the vagina after wetting it and before sexual intercourse.  Spermicides These are chemicals that kill or block sperm from entering the cervix and uterus. They come in the form of creams, jellies, suppositories, foam, or tablets. They do not require a  prescription. They are inserted into the vagina with an applicator before having sexual intercourse. The process must be repeated every time you have sexual intercourse. INTRAUTERINE CONTRACEPTION  Intrauterine device (IUD) This is a T-shaped device that is put in a woman's uterus during a  menstrual period to prevent pregnancy. There are 2 types:  Copper IUD This type of IUD is wrapped in copper wire and is placed inside the uterus. Copper makes the uterus and fallopian tubes produce a fluid that kills sperm. It can stay in place for 10 years.  Hormone IUD This type of IUD contains the hormone progestin (synthetic progesterone). The hormone thickens the cervical mucus and prevents sperm from entering the uterus, and it also thins the uterine lining to prevent implantation of a fertilized egg. The hormone can weaken or kill the sperm that get into the uterus. It can stay in place for 3 5 years, depending on which type of IUD is used. PERMANENT METHODS OF CONTRACEPTION  Female tubal ligation This is when the woman's fallopian tubes are surgically sealed, tied, or blocked to prevent the egg from traveling to the uterus.  Hysteroscopic sterilization This involves placing a small coil or insert into each fallopian tube. Your doctor uses a technique called hysteroscopy to do the procedure. The device causes scar tissue to form. This results in permanent blockage of the fallopian tubes, so the sperm cannot fertilize the egg. It takes about 3 months after the procedure for the tubes to become blocked. You must use another form of birth control for these 3 months.  Female sterilization This is when the female has the tubes that carry sperm tied off (vasectomy).This blocks sperm from entering the vagina during sexual intercourse. After the procedure, the man can still ejaculate fluid (semen). NATURAL PLANNING METHODS  Natural family planning This is not having sexual intercourse or using a barrier method (condom, diaphragm, cervical cap) on days the woman could become pregnant.  Calendar method This is keeping track of the length of each menstrual cycle and identifying when you are fertile.  Ovulation method This is avoiding sexual intercourse during ovulation.  Symptothermal method This is  avoiding sexual intercourse during ovulation, using a thermometer and ovulation symptoms.  Post ovulation method This is timing sexual intercourse after you have ovulated. Regardless of which type or method of contraception you choose, it is important that you use condoms to protect against the transmission of sexually transmitted infections (STIs). Talk with your health care provider about which form of contraception is most appropriate for you. Document Released: 11/30/2005 Document Revised: 08/02/2013 Document Reviewed: 05/25/2013 [facility] Patient Information 2014 [place]. Anemia, Nonspecific Anemia is a condition in which the concentration of red blood cells or hemoglobin in the blood is below normal. Hemoglobin is a substance in red blood cells that carries oxygen to the tissues of the body. Anemia results in not enough oxygen reaching these tissues.  CAUSES  Common causes of anemia include:   Excessive bleeding. Bleeding may be internal or external. This includes excessive bleeding from periods (in women) or from the intestine.   Poor nutrition.   Chronic kidney, thyroid, and liver disease.  Bone marrow disorders that decrease red blood cell production.  Cancer and treatments for cancer.  HIV, AIDS, and their treatments.  Spleen problems that increase red blood cell destruction.  Blood disorders.  Excess destruction of red blood cells due to infection, medicines, and autoimmune disorders. SIGNS AND SYMPTOMS   Minor weakness.   Dizziness.  Headache.  Palpitations.   Shortness of breath, especially with exercise.   Paleness.  Cold sensitivity.  Indigestion.  Nausea.  Difficulty sleeping.  Difficulty concentrating. Symptoms may occur suddenly or they may develop slowly.  DIAGNOSIS  Additional blood tests are often needed. These help your health care provider determine the best treatment. Your health care provider will check your stool for blood  and look for other causes of blood loss.  TREATMENT  Treatment varies depending on the cause of the anemia. Treatment can include:   Supplements of iron, vitamin U44, or folic acid.   Hormone medicines.   A blood transfusion. This may be needed if blood loss is severe.   Hospitalization. This may be needed if there is significant continual blood loss.   Dietary changes.  Spleen removal. HOME CARE INSTRUCTIONS Keep all follow-up appointments. It often takes many weeks to correct anemia, and having your health care provider check on your condition and your response to treatment is very important. SEEK IMMEDIATE MEDICAL CARE IF:   You develop extreme weakness, shortness of breath, or chest pain.   You become dizzy or have trouble concentrating.  You develop heavy vaginal bleeding.   You develop a rash.   You have bloody or black, tarry stools.   You faint.   You vomit up blood.   You vomit repeatedly.   You have abdominal pain.  You have a fever or persistent symptoms for more than 2 3 days.   You have a fever and your symptoms suddenly get worse.   You are dehydrated.  MAKE SURE YOU:  Understand these instructions.  Will watch your condition.  Will get help right away if you are not doing well or get worse. Document Released: 01/07/2005 Document Revised: 08/02/2013 Document Reviewed: 05/26/2013 [facility] Patient Information 2014 [place]. Postpartum Depression and Baby Blues The postpartum period begins right after the birth of a baby. During this time, there is often a great amount of joy and excitement. It is also a time of considerable changes in the life of the parent(s). Regardless of how many times a mother gives birth, each child brings new challenges and dynamics to the family. It is not unusual to have feelings of excitement accompanied by confusing shifts in moods, emotions, and thoughts. All mothers are at risk of developing postpartum  depression or the "baby blues." These mood changes can occur right after giving birth, or they may occur many months after giving birth. The baby blues or postpartum depression can be mild or severe. Additionally, postpartum depression can resolve rather quickly, or it can be a long-term condition. CAUSES Elevated hormones and their rapid decline are thought to be a main cause of postpartum depression and the baby blues. There are a number of hormones that radically change during and after pregnancy. Estrogen and progesterone usually decrease immediately after delivering your baby. The level of thyroid hormone and various cortisol steroids also rapidly drop. Other factors that play a major role in these changes include major life events and genetics.  RISK FACTORS If you have any of the following risks for the baby blues or postpartum depression, know what symptoms to watch out for during the postpartum period. Risk factors that may increase the likelihood of getting the baby blues or postpartum depression include:  Havinga personal or family history of depression.  Having depression while being pregnant.  Having premenstrual or oral contraceptive-associated mood issues.  Having exceptional life stress.  Having marital conflict.  Lacking  a social support network.  Having a baby with special needs.  Having health problems such as diabetes. SYMPTOMS Baby blues symptoms include:  Brief fluctuations in mood, such as going from extreme happiness to sadness.  Decreased concentration.  Difficulty sleeping.  Crying spells, tearfulness.  Irritability.  Anxiety. Postpartum depression symptoms typically begin within the first month after giving birth. These symptoms include:  Difficulty sleeping or excessive sleepiness.  Marked weight loss.  Agitation.  Feelings of worthlessness.  Lack of interest in activity or food. Postpartum psychosis is a very concerning condition and can be  dangerous. Fortunately, it is rare. Displaying any of the following symptoms is cause for immediate medical attention. Postpartum psychosis symptoms include:  Hallucinations and delusions.  Bizarre or disorganized behavior.  Confusion or disorientation. DIAGNOSIS  A diagnosis is made by an evaluation of your symptoms. There are no medical or lab tests that lead to a diagnosis, but there are various questionnaires that a caregiver may use to identify those with the baby blues, postpartum depression, or psychosis. Often times, a screening tool called the [place] Postnatal Depression Scale is used to diagnose depression in the postpartum period.  TREATMENT The baby blues usually goes away on its own in 1 to 2 weeks. Social support is often all that is needed. You should be encouraged to get adequate sleep and rest. Occasionally, you may be given medicines to help you sleep.  Postpartum depression requires treatment as it can last several months or longer if it is not treated. Treatment may include individual or group therapy, medicine, or both to address any social, physiological, and psychological factors that may play a role in the depression. Regular exercise, a healthy diet, rest, and social support may also be strongly recommended.  Postpartum psychosis is more serious and needs treatment right away. Hospitalization is often needed. HOME CARE INSTRUCTIONS  Get as much rest as you can. Nap when the baby sleeps.  Exercise regularly. Some women find yoga and walking to be beneficial.  Eat a balanced and nourishing diet.  Do little things that you enjoy. Have a cup of tea, take a bubble bath, read your favorite magazine, or listen to your favorite music.  Avoid alcohol.  Ask for help with household chores, cooking, grocery shopping, or running errands as needed. Do not try to do everything.  Talk to people close to you about how you are feeling. Get support from your partner, family  members, friends, or other new moms.  Try to stay positive in how you think. Think about the things you are grateful for.  Do not spend a lot of time alone.  Only take medicine as directed by your caregiver.  Keep all your postpartum appointments.  Let your caregiver know if you have any concerns. SEEK MEDICAL CARE IF: You are having a reaction or problems with your medicine. SEEK IMMEDIATE MEDICAL CARE IF:  You have suicidal feelings.  You feel you may harm the baby or someone else. Document Released: 09/03/2004 Document Revised: 02/22/2012 Document Reviewed: 09/11/2013 [facility] Patient Information 2014 [place], [place]. Postpartum Care After Vaginal Delivery After you deliver your newborn (postpartum period), the usual stay in the hospital is 24 72 hours. If there were problems with your labor or delivery, or if you have other medical problems, you might be in the hospital longer.  While you are in the hospital, you will receive help and instructions on how to care for yourself and your newborn during the postpartum period.  While  you are in the hospital:  Be sure to tell your nurses if you have pain or discomfort, as well as where you feel the pain and what makes the pain worse.  If you had an incision made near your vagina (episiotomy) or if you had some tearing during delivery, the nurses may put ice packs on your episiotomy or tear. The ice packs may help to reduce the pain and swelling.  If you are breastfeeding, you may feel uncomfortable contractions of your uterus for a couple of weeks. This is normal. The contractions help your uterus get back to normal size.  It is normal to have some bleeding after delivery.  For the first 1 3 days after delivery, the flow is red and the amount may be similar to a period.  It is common for the flow to start and stop.  In the first few days, you may pass some small clots. Let your nurses know if you begin to pass large clots or your  flow increases.  Do not  flush blood clots down the toilet before having the nurse look at them.  During the next 3 10 days after delivery, your flow should become more watery and pink or brown-tinged in color.  Ten to fourteen days after delivery, your flow should be a small amount of yellowish-white discharge.  The amount of your flow will decrease over the first few weeks after delivery. Your flow may stop in 6 8 weeks. Most women have had their flow stop by 12 weeks after delivery.  You should change your sanitary pads frequently.  Wash your hands thoroughly with soap and water for at least 20 seconds after changing pads, using the toilet, or before holding or feeding your newborn.  You should feel like you need to empty your bladder within the first 6 8 hours after delivery.  In case you become weak, lightheaded, or faint, call your nurse before you get out of bed for the first time and before you take a shower for the first time.  Within the first few days after delivery, your breasts may begin to feel tender and full. This is called engorgement. Breast tenderness usually goes away within 48 72 hours after engorgement occurs. You may also notice milk leaking from your breasts. If you are not breastfeeding, do not stimulate your breasts. Breast stimulation can make your breasts produce more milk.  Spending as much time as possible with your newborn is very important. During this time, you and your newborn can feel close and get to know each other. Having your newborn stay in your room (rooming in) will help to strengthen the bond with your newborn. It will give you time to get to know your newborn and become comfortable caring for your newborn.  Your hormones change after delivery. Sometimes the hormone changes can temporarily cause you to feel sad or tearful. These feelings should not last more than a few days. If these feelings last longer than that, you should talk to your  caregiver.  If desired, talk to your caregiver about methods of family planning or contraception.  Talk to your caregiver about immunizations. Your caregiver may want you to have the following immunizations before leaving the hospital:  Tetanus, diphtheria, and pertussis (Tdap) or tetanus and diphtheria (Td) immunization. It is very important that you and your family (including grandparents) or others caring for your newborn are up-to-date with the Tdap or Td immunizations. The Tdap or Td immunization can help protect  your newborn from getting ill.  Rubella immunization.  Varicella (chickenpox) immunization.  Influenza immunization. You should receive this annual immunization if you did not receive the immunization during your pregnancy. Document Released: 09/27/2007 Document Revised: 08/24/2012 Document Reviewed: 07/27/2012 [facility] Patient Information 2014 [place].

## 2014-10-09 ENCOUNTER — Emergency Department (HOSPITAL_BASED_OUTPATIENT_CLINIC_OR_DEPARTMENT_OTHER)
Admission: EM | Admit: 2014-10-09 | Discharge: 2014-10-09 | Disposition: A | Discharge status: Home / Self Care | Payer: BC Managed Care – PPO | Attending: Emergency Medicine | Admitting: Emergency Medicine

## 2014-10-09 ENCOUNTER — Encounter (HOSPITAL_BASED_OUTPATIENT_CLINIC_OR_DEPARTMENT_OTHER): Payer: Self-pay | Admitting: Emergency Medicine

## 2014-10-09 DIAGNOSIS — I509 Heart failure, unspecified: Secondary | ICD-10-CM | POA: Insufficient documentation

## 2014-10-09 DIAGNOSIS — Z87440 Personal history of urinary (tract) infections: Secondary | ICD-10-CM | POA: Insufficient documentation

## 2014-10-09 DIAGNOSIS — Z872 Personal history of diseases of the skin and subcutaneous tissue: Secondary | ICD-10-CM | POA: Diagnosis not present

## 2014-10-09 DIAGNOSIS — J45909 Unspecified asthma, uncomplicated: Secondary | ICD-10-CM | POA: Diagnosis not present

## 2014-10-09 DIAGNOSIS — Z8619 Personal history of other infectious and parasitic diseases: Secondary | ICD-10-CM | POA: Insufficient documentation

## 2014-10-09 DIAGNOSIS — Z8639 Personal history of other endocrine, nutritional and metabolic disease: Secondary | ICD-10-CM | POA: Diagnosis not present

## 2014-10-09 DIAGNOSIS — Z3202 Encounter for pregnancy test, result negative: Secondary | ICD-10-CM | POA: Diagnosis not present

## 2014-10-09 DIAGNOSIS — Z79899 Other long term (current) drug therapy: Secondary | ICD-10-CM | POA: Diagnosis not present

## 2014-10-09 DIAGNOSIS — N898 Other specified noninflammatory disorders of vagina: Secondary | ICD-10-CM | POA: Diagnosis present

## 2014-10-09 LAB — WET PREP, GENITAL
Trich, Wet Prep: NONE SEEN
Yeast Wet Prep HPF POC: NONE SEEN

## 2014-10-09 LAB — URINALYSIS, ROUTINE W REFLEX MICROSCOPIC
Bilirubin Urine: NEGATIVE
Glucose, UA: NEGATIVE mg/dL
HGB URINE DIPSTICK: NEGATIVE
Ketones, ur: NEGATIVE mg/dL
Nitrite: NEGATIVE
Protein, ur: NEGATIVE mg/dL
SPECIFIC GRAVITY, URINE: 1.029 (ref 1.005–1.030)
UROBILINOGEN UA: 1 mg/dL (ref 0.0–1.0)
pH: 7 (ref 5.0–8.0)

## 2014-10-09 LAB — PREGNANCY, URINE: PREG TEST UR: NEGATIVE

## 2014-10-09 LAB — HIV ANTIBODY (ROUTINE TESTING W REFLEX): HIV 1&2 Ab, 4th Generation: NONREACTIVE

## 2014-10-09 LAB — URINE MICROSCOPIC-ADD ON

## 2014-10-09 MED ORDER — AZITHROMYCIN 250 MG PO TABS
1000.0000 mg | ORAL_TABLET | Freq: Once | ORAL | Status: AC
  Administered 2014-10-09: 1000 mg via ORAL
  Filled 2014-10-09: qty 4

## 2014-10-09 MED ORDER — LIDOCAINE HCL (PF) 1 % IJ SOLN
INTRAMUSCULAR | Status: AC
  Administered 2014-10-09: 09:00:00
  Filled 2014-10-09: qty 5

## 2014-10-09 MED ORDER — CEFTRIAXONE SODIUM 250 MG IJ SOLR
250.0000 mg | Freq: Once | INTRAMUSCULAR | Status: AC
  Administered 2014-10-09: 250 mg via INTRAMUSCULAR
  Filled 2014-10-09: qty 250

## 2014-10-09 MED ORDER — ONDANSETRON 4 MG PO TBDP
4.0000 mg | ORAL_TABLET | Freq: Once | ORAL | Status: AC
  Administered 2014-10-09: 4 mg via ORAL
  Filled 2014-10-09: qty 1

## 2014-10-09 MED ORDER — METRONIDAZOLE 500 MG PO TABS
2000.0000 mg | ORAL_TABLET | Freq: Once | ORAL | Status: AC
  Administered 2014-10-09: 2000 mg via ORAL
  Filled 2014-10-09: qty 4

## 2014-10-09 NOTE — ED Notes (Signed)
Pt reports lower abdominal pain with white discharge. Reports wanting STD testing. Denies dysuria

## 2014-10-09 NOTE — ED Provider Notes (Addendum)
CSN: [id number]     Arrival date & time 10/09/14  0747 History   First MD Initiated Contact with Patient 10/09/14 0813     Chief Complaint  Patient presents with  . Vaginal Discharge     (Consider location/radiation/quality/duration/timing/severity/associated sxs/prior Treatment) Patient is a 23 y.o. female presenting with vaginal discharge. The history is provided by the patient.  Vaginal Discharge Associated symptoms: no abdominal pain, no dysuria and no fever    patient with complaint of lower abdominal pain and white vaginal discharge. Abdominal pain is not severe. Patient is concerned about possible STD. No dysuria. Symptoms of been ongoing for 2 days. Pain does radiate to the back. Pain is 6 out of 10. The discomfort is described as an ache.  Past Medical History  Diagnosis Date  . Eczema   . CHF (congestive heart failure)     RESOLVED; HAD CARDIAC EVAL   . Congestive heart failure 2011    related to preeclampsia  . Hypothyroidism AGE 22     NO TX  . Pregnancy induced hypertension 2011  . Preterm labor 2011  . Infection     UTI OCC  . Infection 2013    [place]  . Headache(784.0) 2011    MIGRAQINES; OTC  . Asthma 2009; CORNERSTONE    INHALER PRN  . Vaginal Pap smear, abnormal     colpo  . Hyperthyroidism    Past Surgical History  Procedure Laterality Date  . Tonsillectomy    . Wisdom tooth extraction    . Tear duct probing    . Cholecystectomy     Family History  Problem Relation Age of Onset  . Anesthesia problems Neg Hx   . Other Neg Hx   . Asthma Brother   . Learning disabilities Brother   . Asthma Daughter   . Learning disabilities Daughter   . Seizures Daughter   . Learning disabilities Other   . Colon cancer Maternal Grandmother   . Cancer Maternal Grandmother     colon   History  Substance Use Topics  . Smoking status: Never Smoker   . Smokeless tobacco: Never Used  . Alcohol Use: No   OB History   Grav Para Term Preterm Abortions TAB SAB  Ect Mult Living   4 3 2 1 1  1   3      Obstetric Comments   2011 PIH;IOL; PP CHF-HOSPITALIZED X 1 WEEK     Review of Systems  Constitutional: Negative for fever.  HENT: Negative for congestion.   Eyes: Negative for redness.  Respiratory: Negative for shortness of breath.   Cardiovascular: Positive for chest pain.  Gastrointestinal: Negative for abdominal pain.  Genitourinary: Positive for vaginal discharge and pelvic pain. Negative for dysuria and vaginal bleeding.  Musculoskeletal: Negative for back pain.  Skin: Negative for rash.  Neurological: Negative for headaches.  Hematological: Does not bruise/bleed easily.  Psychiatric/Behavioral: Negative for confusion.      Allergies  Review of patient's allergies indicates no known allergies.  Home Medications   Prior to Admission medications   Medication Sig Start Date End Date Taking? Authorizing Provider  acetaminophen (TYLENOL) 325 MG tablet Take 650 mg by mouth every 6 (six) hours as needed for moderate pain.    Historical Provider, MD  albuterol (PROVENTIL HFA;VENTOLIN HFA) 108 (90 BASE) MCG/ACT inhaler Inhale 2 puffs into the lungs every 6 (six) hours as needed for wheezing. 10/17/13   Gwen Pounds, CNM  ibuprofen (ADVIL,MOTRIN) 600 MG tablet Take 1  tablet (600 mg total) by mouth every 6 (six) hours as needed. 05/23/14   Farrel Gordon, CNM  Iron Polysacch Cmplx-B12-FA 150-0.025-1 MG CAPS Take 1 tablet by mouth 2 (two) times daily. 05/20/14   Charlane Ferretti, CNM  Prenatal Vit-Fe Fumarate-FA (PRENATAL MULTIVITAMIN) TABS tablet Take 1 tablet by mouth daily at 12 noon.    Historical Provider, MD   BP 129/79  Pulse 106  Temp(Src) 98.3 F (36.8 C) (Oral)  Resp 14  Ht 5\' 1"  (1.549 m)  Wt 180 lb (81.647 kg)  BMI 34.03 kg/m2  SpO2 100%  LMP 09/22/2014 Physical Exam  Nursing note and vitals reviewed. Constitutional: She is oriented to person, place, and time. She appears well-developed and well-nourished. No distress.   HENT:  Head: Normocephalic and atraumatic.  Mouth/Throat: Oropharynx is clear and moist.  Eyes: Conjunctivae and EOM are normal. Pupils are equal, round, and reactive to light.  Neck: Normal range of motion.  Cardiovascular: Normal rate, regular rhythm and normal heart sounds.   No murmur heard. Pulmonary/Chest: Effort normal and breath sounds normal. No respiratory distress.  Abdominal: Soft. Bowel sounds are normal. There is no tenderness.  Genitourinary: Uterus normal. Vaginal discharge found.  No cervical motion tenderness. No uterine tenderness. No adnexal tenderness. External genitalia is normal. No lesions. Thick white discharge in vaginal vault.  Musculoskeletal: Normal range of motion.  Neurological: She is alert and oriented to person, place, and time. No cranial nerve deficit. She exhibits normal muscle tone. Coordination normal.  Skin: Skin is warm. No rash noted.    ED Course  Procedures (including critical care time) Labs Review Labs Reviewed  URINALYSIS, ROUTINE W REFLEX MICROSCOPIC - Abnormal; Notable for the following:    APPearance TURBID (*)    Leukocytes, UA LARGE (*)    All other components within normal limits  URINE MICROSCOPIC-ADD ON - Abnormal; Notable for the following:    Squamous Epithelial / LPF MANY (*)    Bacteria, UA MANY (*)    All other components within normal limits  GC/CHLAMYDIA PROBE AMP  WET PREP, GENITAL  PREGNANCY, URINE  HIV ANTIBODY (ROUTINE TESTING)   Results for orders placed during the hospital encounter of 10/09/14  WET PREP, GENITAL      Result Value Ref Range   Yeast Wet Prep HPF POC NONE SEEN  NONE SEEN   Trich, Wet Prep NONE SEEN  NONE SEEN   Clue Cells Wet Prep HPF POC MODERATE (*) NONE SEEN   WBC, Wet Prep HPF POC MANY (*) NONE SEEN  URINALYSIS, ROUTINE W REFLEX MICROSCOPIC      Result Value Ref Range   Color, Urine YELLOW  YELLOW   APPearance TURBID (*) CLEAR   Specific Gravity, Urine 1.029  1.005 - 1.030   pH 7.0   5.0 - 8.0   Glucose, UA NEGATIVE  NEGATIVE mg/dL   Hgb urine dipstick NEGATIVE  NEGATIVE   Bilirubin Urine NEGATIVE  NEGATIVE   Ketones, ur NEGATIVE  NEGATIVE mg/dL   Protein, ur NEGATIVE  NEGATIVE mg/dL   Urobilinogen, UA 1.0  0.0 - 1.0 mg/dL   Nitrite NEGATIVE  NEGATIVE   Leukocytes, UA LARGE (*) NEGATIVE  PREGNANCY, URINE      Result Value Ref Range   Preg Test, Ur NEGATIVE  NEGATIVE  URINE MICROSCOPIC-ADD ON      Result Value Ref Range   Squamous Epithelial / LPF MANY (*) RARE   WBC, UA 21-50  <3 WBC/hpf   RBC / HPF  0-2  <3 RBC/hpf   Bacteria, UA MANY (*) RARE     Imaging Review No results found.   EKG Interpretation None      MDM   Final diagnoses:  Vaginal discharge    Patient Spring test negative. Patient clinically concerned about STD. Patient with thick whitish discharge. Wet prep pending. Will treat for STD empirically. We'll also wait for results of wet prep prior to discharge. Patient without significant uterine or adnexal tenderness. Clinically not concerned about PID. Urinalysis most likely represents contamination. Do not think it's a urinary tract infection. Patient nontoxic no acute distress.  Patient treated here with the Rocephin IM 1 g of Zithromax. And 2 g of Flagyl. This should cover STDs. HIV test pending. Cultures pending. Prep suggestive of perhaps bacterial vaginosis. Flagyl should cover that. Patient will return for any new or worse symptoms. Clinically not consistent with PID.  Fredia Sorrow, MD 10/09/14 0814  Fredia Sorrow, MD 10/09/14 [phone]

## 2014-10-09 NOTE — Discharge Instructions (Signed)
Treatment provided today covers a STDs. Return for any new or worse symptoms. Formal cultures are pending and will take several days to come back.

## 2014-10-10 LAB — GC/CHLAMYDIA PROBE AMP
CT Probe RNA: NEGATIVE
GC Probe RNA: POSITIVE — AB

## 2014-10-11 ENCOUNTER — Telehealth (HOSPITAL_BASED_OUTPATIENT_CLINIC_OR_DEPARTMENT_OTHER): Payer: Self-pay | Admitting: Emergency Medicine

## 2014-10-11 NOTE — Telephone Encounter (Signed)
Post ED Visit - Positive Culture Follow-up  Culture report reviewed by antimicrobial stewardship pharmacist: []  Wes Dulaney, Pharm.D., BCPS []  Heide Guile, Pharm.D., BCPS []  Alycia Rossetti, Pharm.D., BCPS []  [place], Pharm.D., BCPS, AAHIVP []  Legrand Como, Pharm.D., BCPS, AAHIVP []  Carly Sabat, Pharm.D. []  Elenor Quinones, Pharm.D.  Positive gonorrhea culture Treated with rocephin and zithromax, organism sensitive to the same and no further patient follow-up is required at this time.  Hazle Nordmann 10/11/2014, 11:31 AM

## 2014-10-12 ENCOUNTER — Telehealth (HOSPITAL_COMMUNITY): Payer: Self-pay

## 2014-10-15 ENCOUNTER — Encounter (HOSPITAL_BASED_OUTPATIENT_CLINIC_OR_DEPARTMENT_OTHER): Payer: Self-pay | Admitting: Emergency Medicine

## 2014-12-14 NOTE — L&D Delivery Note (Signed)
Patient is 24 y.o. [id number] [redacted]w[redacted]d admitted for PROM.   Delivery Note At 3:54 AM a viable female was delivered via Vaginal, Spontaneous Delivery (Presentation: occiput; anterior).  APGAR: 9, 10; weight pending.   Placenta status: Intact, Spontaneous.  Cord: 3 vessels with the following complications: None.    Anesthesia:  Epidural Episiotomy:  None Lacerations:  None Est. Blood Loss (mL):  200  Mom to postpartum.  Baby to Couplet care / Skin to Skin.  Adin Hector, MD PGY-1 [facility]  09/10/2015, 4:10 AM

## 2014-12-17 ENCOUNTER — Inpatient Hospital Stay (HOSPITAL_COMMUNITY)
Admission: AD | Admit: 2014-12-17 | Discharge: 2014-12-17 | Disposition: A | Discharge status: Home / Self Care | Payer: Medicaid Other | Source: Ambulatory Visit | Attending: Obstetrics and Gynecology | Admitting: Obstetrics and Gynecology

## 2014-12-17 ENCOUNTER — Encounter (HOSPITAL_COMMUNITY): Payer: Self-pay | Admitting: *Deleted

## 2014-12-17 DIAGNOSIS — Z3201 Encounter for pregnancy test, result positive: Secondary | ICD-10-CM | POA: Diagnosis not present

## 2014-12-17 DIAGNOSIS — N926 Irregular menstruation, unspecified: Secondary | ICD-10-CM | POA: Insufficient documentation

## 2014-12-17 LAB — HCG, QUANTITATIVE, PREGNANCY: hCG, Beta Chain, Quant, S: 5 m[IU]/mL — ABNORMAL HIGH (ref <5)

## 2014-12-17 LAB — POCT PREGNANCY, URINE: PREG TEST UR: NEGATIVE

## 2014-12-17 NOTE — Discharge Instructions (Signed)
Human Chorionic Gonadotropin (hCG) This is a test to confirm and monitor pregnancy or to diagnose trophoblastic disease or germ cell tumors. As early as 10 days after a missed menstrual period (some methods can detect hCG even earlier, at one week after conception) or if your caregiver thinks that your symptoms suggest ectopic pregnancy, a failing pregnancy, trophoblastic disease, or germ cell tumors. hCG is a protein produced in the placenta of a pregnant woman. A pregnancy test is a specific blood or urine test that can detect hCG and confirm pregnancy. This hormone is able to be detected 10 days after a missed menstrual period, the time period when the fertilized egg is implanted in the woman's uterus. With some methods, hCG can be detected even earlier, at one week after conception.  During the early weeks of pregnancy, hCG is important in maintaining function of the corpus luteum (the mass of cells that forms from a mature egg). Production of hCG increases steadily during the first trimester (8-10 weeks), peaking around the 10th week after the last menstrual cycle. Levels then fall slowly during the remainder of the pregnancy. hCG is no longer detectable within a few weeks after delivery. hCG is also produced by some germ cell tumors and increased levels are seen in trophoblastic disease. SAMPLE COLLECTION hCG is commonly detected in urine. The preferred specimen is a random urine sample collected first thing in the morning. hCG can also be measured in blood drawn from a vein in the arm. NORMAL FINDINGS Qualitative:negative in non-pregnant women; positive in pregnancy Quantitative:   Gestation less than 1 week: 5-50 Whole HCG (milli-international units/mL)  Gestation of 2 weeks: 50-500 Whole HCG (milli-international units/mL)  Gestation of 3 weeks: 100-10,000 Whole HCG (milli-international units/mL)  Gestation of 4 weeks: 1,000-30,000 Whole HCG (milli-international units/mL)  Gestation of 5  weeks 3,500-115,000 Whole HCG (milli-international units/mL)  Gestation of 6-8 weeks: 12,000-270,000 Whole HCG (milli-international units/mL)  Gestation of 12 weeks: 15,000-220,000 Whole HCG (milli-international units/mL)  Males and non-pregnant females: less than 5 Whole HCG (milli-international units/mL) Beta subunit: depends on the method and test used Ranges for normal findings may vary among different laboratories and hospitals. You should always check with your doctor after having lab work or other tests done to discuss the meaning of your test results and whether your values are considered within normal limits. MEANING OF TEST  Your caregiver will go over the test results with you and discuss the importance and meaning of your results, as well as treatment options and the need for additional tests if necessary. OBTAINING THE TEST RESULTS It is your responsibility to obtain your test results. Ask the lab or department performing the test when and how you will get your results. Document Released: 01/01/2005 Document Revised: 02/22/2012 Document Reviewed: 03/05/2014 [facility] Patient Information 2015 [place], [place]. This information is not intended to replace advice given to you by your health care provider. Make sure you discuss any questions you have with your health care provider.

## 2014-12-17 NOTE — MAU Note (Signed)
Took preg test at home, was neg, but can't really see the other line.  Period is late. Had to confirm with blood test before, was 3 wks preg.

## 2014-12-17 NOTE — MAU Provider Note (Signed)
History     CSN: [id number]  Arrival date and time: 12/17/14 8250   First Provider Initiated Contact with Patient 12/17/14 1710      Chief Complaint  Patient presents with  . Possible Pregnancy   HPI  Ms. Brenda Spencer is a 24 y.o. [phone] who presents to MAU with complaint of possible pregnancy. Patient states LMP of 11/15/14 and a history of irregular periods. She states negative HPT recently. She denies abdominal pain, vaginal bleeding, discharge, fever, N/V/D or constipation or dysuria. She states that her sister had negative UPT at [redacted] weeks gestation and insists on "blood pregnancy test."   OB History    Gravida Para Term Preterm AB TAB SAB Ectopic Multiple Living   4 3 2 1 1  1   3       Obstetric Comments   2011 PIH;IOL; PP CHF-HOSPITALIZED X 1 WEEK      Past Medical History  Diagnosis Date  . Eczema   . CHF (congestive heart failure)     RESOLVED; HAD CARDIAC EVAL   . Congestive heart failure 2011    related to preeclampsia  . Hypothyroidism AGE 60     NO TX  . Pregnancy induced hypertension 2011  . Preterm labor 2011  . Infection     UTI OCC  . Infection 2013    [place]  . Headache(784.0) 2011    MIGRAQINES; OTC  . Asthma 2009; CORNERSTONE    INHALER PRN  . Vaginal Pap smear, abnormal     colpo  . Hyperthyroidism     Past Surgical History  Procedure Laterality Date  . Tonsillectomy    . Wisdom tooth extraction    . Tear duct probing    . Cholecystectomy      Family History  Problem Relation Age of Onset  . Anesthesia problems Neg Hx   . Other Neg Hx   . Asthma Brother   . Learning disabilities Brother   . Asthma Daughter   . Learning disabilities Daughter   . Seizures Daughter   . Learning disabilities Other   . Colon cancer Maternal Grandmother   . Cancer Maternal Grandmother     colon    History  Substance Use Topics  . Smoking status: Never Smoker   . Smokeless tobacco: Never Used  . Alcohol Use: No    Allergies: No Known  Allergies  Prescriptions prior to admission  Medication Sig Dispense Refill Last Dose  . albuterol (PROVENTIL HFA;VENTOLIN HFA) 108 (90 BASE) MCG/ACT inhaler Inhale 2 puffs into the lungs every 6 (six) hours as needed for wheezing. 1 Inhaler 4 Rescue  . ibuprofen (ADVIL,MOTRIN) 600 MG tablet Take 1 tablet (600 mg total) by mouth every 6 (six) hours as needed. (Patient not taking: Reported on 12/17/2014) 30 tablet 2 Not Taking at Unknown time  . Iron Polysacch Cmplx-B12-FA 150-0.025-1 MG CAPS Take 1 tablet by mouth 2 (two) times daily. (Patient not taking: Reported on 12/17/2014) 60 each 2 Not Taking at Unknown time    Review of Systems  Constitutional: Negative for fever and malaise/fatigue.  Gastrointestinal: Negative for nausea, vomiting, abdominal pain, diarrhea and constipation.  Genitourinary: Negative for dysuria, urgency and frequency.       Neg - vaginal bleeding, discharge   Physical Exam   Blood pressure 128/88, temperature 98.2 F (36.8 C), temperature source Oral, resp. rate 18, height 4' 11.25" (1.505 m), weight 194 lb (87.998 kg), last menstrual period 11/15/2014, not currently breastfeeding.  Physical Exam  Constitutional: She is oriented to person, place, and time. She appears well-developed and well-nourished. No distress.  HENT:  Head: Normocephalic.  Cardiovascular: Normal rate.   Respiratory: Effort normal.  GI: Soft.  Neurological: She is alert and oriented to person, place, and time.  Skin: Skin is warm and dry. No erythema.  Psychiatric: She has a normal mood and affect.   Results for orders placed or performed during the hospital encounter of 12/17/14 (from the past 24 hour(s))  Pregnancy, urine POC     Status: None   Collection Time: 12/17/14  4:50 PM  Result Value Ref Range   Preg Test, Ur NEGATIVE NEGATIVE  hCG, quantitative, pregnancy     Status: Abnormal   Collection Time: 12/17/14  5:25 PM  Result Value Ref Range   hCG, Beta Chain, Quant, S 5 (H) <5  mIU/mL     MAU Course  Procedures None  MDM UPT - negative Quant hCG today Discussed with Dr. Nehemiah Settle. Return in 48 hours for follow-up quant hCG to ensure accurate results Assessment and Plan  A: Positive pregnancy test  P: Discharge home Patient to return to MAU in 48 hours for follow-up labs or sooner if symptoms should change  Luvenia Redden, PA-C  12/17/2014, 6:42 PM

## 2014-12-19 ENCOUNTER — Encounter (HOSPITAL_COMMUNITY): Payer: Self-pay | Admitting: *Deleted

## 2014-12-19 ENCOUNTER — Inpatient Hospital Stay (HOSPITAL_COMMUNITY)
Admission: AD | Admit: 2014-12-19 | Discharge: 2014-12-19 | Disposition: A | Discharge status: Home / Self Care | Payer: Medicaid Other | Source: Ambulatory Visit | Attending: Family Medicine | Admitting: Family Medicine

## 2014-12-19 DIAGNOSIS — Z3202 Encounter for pregnancy test, result negative: Secondary | ICD-10-CM | POA: Insufficient documentation

## 2014-12-19 LAB — HCG, QUANTITATIVE, PREGNANCY: HCG, BETA CHAIN, QUANT, S: 1 m[IU]/mL (ref <5)

## 2014-12-19 NOTE — MAU Note (Addendum)
For follow up, feels miserable. Tired and has headache. Bleeding and cramping continue;about the same as on Monday.Marland Kitchen

## 2014-12-19 NOTE — Discharge Instructions (Signed)
Human Chorionic Gonadotropin (hCG) This is a test to confirm and monitor pregnancy or to diagnose trophoblastic disease or germ cell tumors. As early as 10 days after a missed menstrual period (some methods can detect hCG even earlier, at one week after conception) or if your caregiver thinks that your symptoms suggest ectopic pregnancy, a failing pregnancy, trophoblastic disease, or germ cell tumors. hCG is a protein produced in the placenta of a pregnant woman. A pregnancy test is a specific blood or urine test that can detect hCG and confirm pregnancy. This hormone is able to be detected 10 days after a missed menstrual period, the time period when the fertilized egg is implanted in the woman's uterus. With some methods, hCG can be detected even earlier, at one week after conception.  During the early weeks of pregnancy, hCG is important in maintaining function of the corpus luteum (the mass of cells that forms from a mature egg). Production of hCG increases steadily during the first trimester (8-10 weeks), peaking around the 10th week after the last menstrual cycle. Levels then fall slowly during the remainder of the pregnancy. hCG is no longer detectable within a few weeks after delivery. hCG is also produced by some germ cell tumors and increased levels are seen in trophoblastic disease. SAMPLE COLLECTION hCG is commonly detected in urine. The preferred specimen is a random urine sample collected first thing in the morning. hCG can also be measured in blood drawn from a vein in the arm. NORMAL FINDINGS Qualitative:negative in non-pregnant women; positive in pregnancy Quantitative:   Gestation less than 1 week: 5-50 Whole HCG (milli-international units/mL)  Gestation of 2 weeks: 50-500 Whole HCG (milli-international units/mL)  Gestation of 3 weeks: 100-10,000 Whole HCG (milli-international units/mL)  Gestation of 4 weeks: 1,000-30,000 Whole HCG (milli-international units/mL)  Gestation of 5  weeks 3,500-115,000 Whole HCG (milli-international units/mL)  Gestation of 6-8 weeks: 12,000-270,000 Whole HCG (milli-international units/mL)  Gestation of 12 weeks: 15,000-220,000 Whole HCG (milli-international units/mL)  Males and non-pregnant females: less than 5 Whole HCG (milli-international units/mL) Beta subunit: depends on the method and test used Ranges for normal findings may vary among different laboratories and hospitals. You should always check with your doctor after having lab work or other tests done to discuss the meaning of your test results and whether your values are considered within normal limits. MEANING OF TEST  Your caregiver will go over the test results with you and discuss the importance and meaning of your results, as well as treatment options and the need for additional tests if necessary. OBTAINING THE TEST RESULTS It is your responsibility to obtain your test results. Ask the lab or department performing the test when and how you will get your results. Document Released: 01/01/2005 Document Revised: 02/22/2012 Document Reviewed: 03/05/2014 [facility] Patient Information 2015 [place], [place]. This information is not intended to replace advice given to you by your health care provider. Make sure you discuss any questions you have with your health care provider.

## 2014-12-19 NOTE — MAU Provider Note (Signed)
Subjective:  Brenda Spencer is 24 y.o. female [phone] at Unknown gestation who presents for a follow up beta hcg level. She was seen 2 days ago and had a quant of 5. She felt pregnant and asked for a beta hcg level.    RN note:   Expand All Collapse All   For follow up, feels miserable. Tired and has headache. Bleeding and cramping continue;about the same as on Monday..      Objective:  GENERAL: Well-developed, well-nourished female in no acute distress.  HEENT: Normocephalic, atraumatic.   LUNGS: Effort normal HEART: Regular rate  SKIN: Warm, dry and without erythema PSYCH: Normal mood and affect  Filed Vitals:   12/19/14 1332  BP: 125/77  Pulse: 84  Temp: 98.7 F (37.1 C)  Resp: 18    Results for orders placed or performed during the hospital encounter of 12/19/14 (from the past 48 hour(s))  hCG, quantitative, pregnancy     Status: None   Collection Time: 12/19/14  1:17 PM  Result Value Ref Range   hCG, Beta Chain, Quant, S 1 <5 mIU/mL    Comment:          GEST. AGE      CONC.  (mIU/mL)   <=1 WEEK        5 - 50     2 WEEKS       50 - 500     3 WEEKS       100 - 10,000     4 WEEKS     1,000 - 30,000     5 WEEKS     3,500 - 115,000   6-8 WEEKS     12,000 - 270,000    12 WEEKS     15,000 - 220,000        FEMALE AND NON-PREGNANT FEMALE:     LESS THAN 5 mIU/mL REPEATED TO VERIFY    MDM: Quant   Assessment:  1. Negative pregnancy test     Plan:  Discharge home in stable condition At home pregnancy test encouraged  [place], NP 12/19/2014 2:20 PM

## 2015-01-17 ENCOUNTER — Inpatient Hospital Stay (HOSPITAL_COMMUNITY)
Admission: AD | Admit: 2015-01-17 | Discharge: 2015-01-17 | Disposition: A | Discharge status: Home / Self Care | Payer: Medicaid Other | Source: Ambulatory Visit | Attending: Family Medicine | Admitting: Family Medicine

## 2015-01-17 ENCOUNTER — Encounter (HOSPITAL_COMMUNITY): Payer: Self-pay | Admitting: *Deleted

## 2015-01-17 DIAGNOSIS — Z3201 Encounter for pregnancy test, result positive: Secondary | ICD-10-CM | POA: Diagnosis not present

## 2015-01-17 DIAGNOSIS — Z3200 Encounter for pregnancy test, result unknown: Secondary | ICD-10-CM | POA: Diagnosis present

## 2015-01-17 LAB — POCT PREGNANCY, URINE: Preg Test, Ur: NEGATIVE

## 2015-01-17 LAB — HCG, QUANTITATIVE, PREGNANCY: hCG, Beta Chain, Quant, S: 101 m[IU]/mL — ABNORMAL HIGH (ref <5)

## 2015-01-17 NOTE — MAU Note (Signed)
Pt reports she took a pregnancy test at home today and "it looked like it was positive", wants confirmation

## 2015-01-17 NOTE — MAU Provider Note (Signed)
S: 24 y.o. [id number] @[redacted]w[redacted]d  by LMP presents to MAU with positive pregnancy test at home, desiring confirmation of pregnancy.  She denies abdominal pain or bleeding.  She was seen in January and had quant hcg of 5, then 2 days later hcg was 1.  She did have bleeding at that time, similar to her usual period.  She took her home pregnancy test the day her period was expected this month and it was faintly positive.    O: BP 140/73 mmHg  Pulse 118  Temp(Src) 98 F (36.7 C) (Oral)  Resp 20  Wt 89.075 kg (196 lb 6 oz)  SpO2 100%  LMP 12/18/2014  Breastfeeding? Unknown   Physical Examination: General appearance - alert, well appearing, and in no distress, oriented to person, place, and time and acyanotic, in no respiratory distress  Results for orders placed or performed during the hospital encounter of 01/17/15 (from the past 48 hour(s))  Pregnancy, urine POC     Status: None   Collection Time: 01/17/15  8:48 PM  Result Value Ref Range   Preg Test, Ur NEGATIVE NEGATIVE    Comment:        THE SENSITIVITY OF THIS METHODOLOGY IS >24 mIU/mL   hCG, quantitative, pregnancy     Status: Abnormal   Collection Time: 01/17/15  9:15 PM  Result Value Ref Range   hCG, Beta Chain, Quant, S 101 (H) <5 mIU/mL    Comment:          GEST. AGE      CONC.  (mIU/mL)   <=1 WEEK        5 - 50     2 WEEKS       50 - 500     3 WEEKS       100 - 10,000     4 WEEKS     1,000 - 30,000     5 WEEKS     3,500 - 115,000   6-8 WEEKS     12,000 - 270,000    12 WEEKS     15,000 - 220,000        FEMALE AND NON-PREGNANT FEMALE:     LESS THAN 5 mIU/mL    A: Positive blood pregnancy test  P: D/C home F/U with early prenatal care Pregnancy verification letter and list of providers given.  Pt may follow up with [facility] or with [facility]. Return to MAU as needed for emergencies  Fatima Blank Certified Nurse-Midwife

## 2015-01-25 ENCOUNTER — Encounter (HOSPITAL_COMMUNITY): Payer: Self-pay

## 2015-01-25 ENCOUNTER — Inpatient Hospital Stay (HOSPITAL_COMMUNITY)
Admission: AD | Admit: 2015-01-25 | Discharge: 2015-01-25 | Disposition: A | Discharge status: Home / Self Care | Payer: Medicaid Other | Source: Ambulatory Visit | Attending: Family Medicine | Admitting: Family Medicine

## 2015-01-25 DIAGNOSIS — Z3A01 Less than 8 weeks gestation of pregnancy: Secondary | ICD-10-CM | POA: Diagnosis not present

## 2015-01-25 DIAGNOSIS — O210 Mild hyperemesis gravidarum: Secondary | ICD-10-CM | POA: Diagnosis not present

## 2015-01-25 DIAGNOSIS — O2341 Unspecified infection of urinary tract in pregnancy, first trimester: Secondary | ICD-10-CM | POA: Diagnosis not present

## 2015-01-25 DIAGNOSIS — R109 Unspecified abdominal pain: Secondary | ICD-10-CM | POA: Diagnosis present

## 2015-01-25 DIAGNOSIS — O219 Vomiting of pregnancy, unspecified: Secondary | ICD-10-CM

## 2015-01-25 DIAGNOSIS — N3000 Acute cystitis without hematuria: Secondary | ICD-10-CM

## 2015-01-25 LAB — URINE MICROSCOPIC-ADD ON

## 2015-01-25 LAB — URINALYSIS, ROUTINE W REFLEX MICROSCOPIC
BILIRUBIN URINE: NEGATIVE
GLUCOSE, UA: NEGATIVE mg/dL
Hgb urine dipstick: NEGATIVE
Ketones, ur: NEGATIVE mg/dL
Nitrite: NEGATIVE
PH: 7 (ref 5.0–8.0)
Protein, ur: NEGATIVE mg/dL
SPECIFIC GRAVITY, URINE: 1.02 (ref 1.005–1.030)
Urobilinogen, UA: 2 mg/dL — ABNORMAL HIGH (ref 0.0–1.0)

## 2015-01-25 MED ORDER — NITROFURANTOIN MONOHYD MACRO 100 MG PO CAPS: 100.0000 mg | ORAL_CAPSULE | Freq: Two times a day (BID) | ORAL | Status: DC

## 2015-01-25 MED ORDER — METOCLOPRAMIDE HCL 10 MG PO TABS: 10.0000 mg | ORAL_TABLET | Freq: Four times a day (QID) | ORAL | Status: DC

## 2015-01-25 MED ORDER — METOCLOPRAMIDE HCL 10 MG PO TABS
5.0000 mg | ORAL_TABLET | Freq: Once | ORAL | Status: AC
  Administered 2015-01-25: 5 mg via ORAL
  Filled 2015-01-25: qty 1

## 2015-01-25 NOTE — MAU Note (Signed)
Feels better since taking reglan.

## 2015-01-25 NOTE — MAU Note (Signed)
Pt states here for hyperemesis, has vomited 3 times today. Last attempted eating a biscuit at 1000 today. Denies issues with constipation or diarrhea. Denies bleeding or abnormal vag d/c.

## 2015-01-25 NOTE — Discharge Instructions (Signed)
Eating Plan for Hyperemesis Gravidarum Severe cases of hyperemesis gravidarum can lead to dehydration and malnutrition. The hyperemesis eating plan is one way to lessen the symptoms of nausea and vomiting. It is often used with prescribed medicines to control your symptoms.  WHAT CAN I DO TO RELIEVE MY SYMPTOMS? Listen to your body. Everyone is different and has different preferences. Find what works best for you. Some of the following things may help:  Eat and drink slowly.  Eat 5-6 small meals daily instead of 3 large meals.   Eat crackers before you get out of bed in the morning.   Starchy foods are usually well tolerated (such as cereal, toast, bread, potatoes, pasta, rice, and pretzels).   Ginger may help with nausea. Add  tsp ground ginger to hot tea or choose ginger tea.   Try drinking 100% fruit juice or an electrolyte drink.  Continue to take your prenatal vitamins as directed by your health care provider. If you are having trouble taking your prenatal vitamins, talk with your health care provider about different options.  Include at least 1 serving of protein with your meals and snacks (such as meats or poultry, beans, nuts, eggs, or yogurt). Try eating a protein-rich snack before bed (such as cheese and crackers or a half [place] or peanut butter sandwich). WHAT THINGS SHOULD I AVOID TO REDUCE MY SYMPTOMS? The following things may help reduce your symptoms:  Avoid foods with strong smells. Try eating meals in well-ventilated areas that are free of odors.  Avoid drinking water or other beverages with meals. Try not to drink anything less than 30 minutes before and after meals.  Avoid drinking more than 1 cup of fluid at a time.  Avoid fried or high-fat foods, such as butter and cream sauces.  Avoid spicy foods.  Avoid skipping meals the best you can. Nausea can be more intense on an empty stomach. If you cannot tolerate food at that time, do not force it. Try sucking on  ice chips or other frozen items and make up the calories later.  Avoid lying down within 2 hours after eating. Document Released: 09/27/2007 Document Revised: 12/05/2013 Document Reviewed: 10/04/2013 [facility] Patient Information 2015 [place], [place]. This information is not intended to replace advice given to you by your health care provider. Make sure you discuss any questions you have with your health care provider.

## 2015-01-25 NOTE — MAU Provider Note (Signed)
History     CSN: [id number]  Arrival date and time: 01/25/15 1306   First Provider Initiated Contact with Patient 01/25/15 1508      Chief Complaint  Patient presents with  . Morning Sickness  . Abdominal Pain   HPI  Ms. Brenda Spencer is a 24 y.o. [phone] at [redacted]w[redacted]d who presents to MAU today with complaint of N/V. The patient states that she has noted increased N/V for the past few days. She states 3 episodes of emesis today. She is having some very mild nausea now. She denies abdominal pain, vaginal bleeding, discharge, fever or diarrhea or constipation. She does endorse occasional dysuria and low back pain, but denies flank pain.   OB History    Gravida Para Term Preterm AB TAB SAB Ectopic Multiple Living   5 3 2 1 1  1   3       Obstetric Comments   2011 PIH;IOL; PP CHF-HOSPITALIZED X 1 WEEK      Past Medical History  Diagnosis Date  . Eczema   . CHF (congestive heart failure)     RESOLVED; HAD CARDIAC EVAL   . Congestive heart failure 2011    related to preeclampsia  . Hypothyroidism AGE 45     NO TX  . Pregnancy induced hypertension 2011  . Preterm labor 2011  . Infection     UTI OCC  . Infection 2013    [place]  . Headache(784.0) 2011    MIGRAQINES; OTC  . Asthma 2009; CORNERSTONE    INHALER PRN  . Vaginal Pap smear, abnormal     colpo  . Hyperthyroidism     Past Surgical History  Procedure Laterality Date  . Tonsillectomy    . Wisdom tooth extraction    . Tear duct probing    . Cholecystectomy      Family History  Problem Relation Age of Onset  . Anesthesia problems Neg Hx   . Other Neg Hx   . Asthma Brother   . Learning disabilities Brother   . Asthma Daughter   . Learning disabilities Daughter   . Seizures Daughter   . Learning disabilities Other   . Colon cancer Maternal Grandmother   . Cancer Maternal Grandmother     colon    History  Substance Use Topics  . Smoking status: Never Smoker   . Smokeless tobacco: Never Used  .  Alcohol Use: No    Allergies: No Known Allergies  Prescriptions prior to admission  Medication Sig Dispense Refill Last Dose  . albuterol (PROVENTIL HFA;VENTOLIN HFA) 108 (90 BASE) MCG/ACT inhaler Inhale 2 puffs into the lungs every 6 (six) hours as needed for wheezing. 1 Inhaler 4 rescue    Review of Systems  Constitutional: Negative for fever and malaise/fatigue.  Gastrointestinal: Positive for nausea and vomiting. Negative for abdominal pain, diarrhea and constipation.  Genitourinary: Negative for dysuria, urgency and frequency.       Neg - vaginal bleeding, discharge   Physical Exam   Blood pressure 119/75, pulse 91, temperature 98.4 F (36.9 C), temperature source Oral, resp. rate 18, height 5\' 1"  (1.549 m), weight 196 lb (88.905 kg), last menstrual period 12/18/2014, SpO2 100 %, unknown if currently breastfeeding.  Physical Exam  Constitutional: She appears well-developed and well-nourished. No distress.  HENT:  Head: Normocephalic.  Cardiovascular: Normal rate.   Respiratory: Effort normal.  GI: Soft. Bowel sounds are normal. She exhibits no distension and no mass. There is no tenderness. There is no  rebound and no guarding.  Neurological: She is alert.  Skin: Skin is warm and dry. No erythema.  Psychiatric: She has a normal mood and affect.   Results for orders placed or performed during the hospital encounter of 01/25/15 (from the past 24 hour(s))  Urinalysis, Routine w reflex microscopic     Status: Abnormal   Collection Time: 01/25/15  1:30 PM  Result Value Ref Range   Color, Urine YELLOW YELLOW   APPearance HAZY (A) CLEAR   Specific Gravity, Urine 1.020 1.005 - 1.030   pH 7.0 5.0 - 8.0   Glucose, UA NEGATIVE NEGATIVE mg/dL   Hgb urine dipstick NEGATIVE NEGATIVE   Bilirubin Urine NEGATIVE NEGATIVE   Ketones, ur NEGATIVE NEGATIVE mg/dL   Protein, ur NEGATIVE NEGATIVE mg/dL   Urobilinogen, UA 2.0 (H) 0.0 - 1.0 mg/dL   Nitrite NEGATIVE NEGATIVE   Leukocytes, UA  SMALL (A) NEGATIVE  Urine microscopic-add on     Status: Abnormal   Collection Time: 01/25/15  1:30 PM  Result Value Ref Range   Squamous Epithelial / LPF MANY (A) RARE   WBC, UA 3-6 <3 WBC/hpf   RBC / HPF 3-6 <3 RBC/hpf   Bacteria, UA MANY (A) RARE   Urine-Other MUCOUS PRESENT     MAU Course  Procedures None  MDM UA today shows no signs of dehydration Reglan PO in MAU today since patient is first trimester and driving today.  Urine culture pending Assessment and Plan  A: SIUP at 109w3d UTI N/V in pregnancy prior to [redacted] weeks gestation  P: Discharge home Rx for Macrobid and Reglan sent to patient's pharmacy Diet for N/V in pregnancy discussed List of OTC medications safe in pregnancy given Advised increased hydration Discussed warning signs for pyelonephritis Patient advised to start prenatal care ASAP with provider of choice Patient may return to MAU as needed or if her condition were to change or worsen   Luvenia Redden, PA-C  01/25/2015, 3:49 PM

## 2015-01-25 NOTE — MAU Note (Signed)
Nausea and vomiting kicked in a couple days ago. Pain in upper abd, there when getting sick.

## 2015-01-27 LAB — CULTURE, OB URINE: Colony Count: 4000

## 2015-02-26 ENCOUNTER — Inpatient Hospital Stay (HOSPITAL_COMMUNITY)
Admission: AD | Admit: 2015-02-26 | Discharge: 2015-02-26 | Disposition: A | Discharge status: Home / Self Care | Payer: Medicaid Other | Source: Ambulatory Visit | Attending: Obstetrics & Gynecology | Admitting: Obstetrics & Gynecology

## 2015-02-26 ENCOUNTER — Encounter (HOSPITAL_COMMUNITY): Payer: Self-pay | Admitting: *Deleted

## 2015-02-26 DIAGNOSIS — O9989 Other specified diseases and conditions complicating pregnancy, childbirth and the puerperium: Secondary | ICD-10-CM | POA: Diagnosis not present

## 2015-02-26 DIAGNOSIS — R05 Cough: Secondary | ICD-10-CM | POA: Diagnosis present

## 2015-02-26 DIAGNOSIS — J069 Acute upper respiratory infection, unspecified: Secondary | ICD-10-CM

## 2015-02-26 DIAGNOSIS — Z3A09 9 weeks gestation of pregnancy: Secondary | ICD-10-CM | POA: Insufficient documentation

## 2015-02-26 DIAGNOSIS — H6090 Unspecified otitis externa, unspecified ear: Secondary | ICD-10-CM | POA: Diagnosis not present

## 2015-02-26 LAB — URINALYSIS, ROUTINE W REFLEX MICROSCOPIC
Bilirubin Urine: NEGATIVE
Glucose, UA: NEGATIVE mg/dL
Hgb urine dipstick: NEGATIVE
KETONES UR: NEGATIVE mg/dL
NITRITE: NEGATIVE
PROTEIN: NEGATIVE mg/dL
Specific Gravity, Urine: >1.03 — ABNORMAL HIGH (ref 1.005–1.030)
Urobilinogen, UA: 1 mg/dL (ref 0.0–1.0)
pH: 6 (ref 5.0–8.0)

## 2015-02-26 LAB — URINE MICROSCOPIC-ADD ON

## 2015-02-26 MED ORDER — PROMETHAZINE-CODEINE 6.25-10 MG/5ML PO SYRP: 5.0000 mL | ORAL_SOLUTION | Freq: Four times a day (QID) | ORAL | Status: DC | PRN

## 2015-02-26 MED ORDER — OTIC CARE 5.4-1.4-0.0097 % OT SOLN: 2.0000 [drp] | Freq: Two times a day (BID) | OTIC | Status: DC

## 2015-02-26 NOTE — MAU Note (Signed)
Started having head cold sx's on Sunday, cough, congestion, runny nose, HA.  Taking tylenol for HA but is ineffective.  Vomited twice today, denies diarrhea or fever.

## 2015-02-26 NOTE — Discharge Instructions (Signed)
Cool Mist Vaporizers Vaporizers may help relieve the symptoms of a cough and cold. They add moisture to the air, which helps mucus to become thinner and less sticky. This makes it easier to breathe and cough up secretions. Cool mist vaporizers do not cause serious burns like hot mist vaporizers, which may also be called steamers or humidifiers. Vaporizers have not been proven to help with colds. You should not use a vaporizer if you are allergic to mold. HOME CARE INSTRUCTIONS  Follow the package instructions for the vaporizer.  Do not use anything other than distilled water in the vaporizer.  Do not run the vaporizer all of the time. This can cause mold or bacteria to grow in the vaporizer.  Clean the vaporizer after each time it is used.  Clean and dry the vaporizer well before storing it.  Stop using the vaporizer if worsening respiratory symptoms develop. Document Released: 08/27/2004 Document Revised: 12/05/2013 Document Reviewed: 04/19/2013 [facility] Patient Information 2015 [place], [place]. This information is not intended to replace advice given to you by your health care provider. Make sure you discuss any questions you have with your health care provider.  Cough, Adult  A cough is a reflex that helps clear your throat and airways. It can help heal the body or may be a reaction to an irritated airway. A cough may only last 2 or 3 weeks (acute) or may last more than 8 weeks (chronic).  CAUSES Acute cough:  Viral or bacterial infections. Chronic cough:  Infections.  Allergies.  Asthma.  Post-nasal drip.  Smoking.  Heartburn or acid reflux.  Some medicines.  Chronic lung problems (COPD).  Cancer. SYMPTOMS   Cough.  Fever.  Chest pain.  Increased breathing rate.  High-pitched whistling sound when breathing (wheezing).  Colored mucus that you cough up (sputum). TREATMENT   A bacterial cough may be treated with antibiotic medicine.  A viral cough must run  its course and will not respond to antibiotics.  Your caregiver may recommend other treatments if you have a chronic cough. HOME CARE INSTRUCTIONS   Only take over-the-counter or prescription medicines for pain, discomfort, or fever as directed by your caregiver. Use cough suppressants only as directed by your caregiver.  Use a cold steam vaporizer or humidifier in your bedroom or home to help loosen secretions.  Sleep in a semi-upright position if your cough is worse at night.  Rest as needed.  Stop smoking if you smoke. SEEK IMMEDIATE MEDICAL CARE IF:   You have pus in your sputum.  Your cough starts to worsen.  You cannot control your cough with suppressants and are losing sleep.  You begin coughing up blood.  You have difficulty breathing.  You develop pain which is getting worse or is uncontrolled with medicine.  You have a fever. MAKE SURE YOU:   Understand these instructions.  Will watch your condition.  Will get help right away if you are not doing well or get worse. Document Released: 05/29/2011 Document Revised: 02/22/2012 Document Reviewed: 05/29/2011 [facility] Patient Information 2015 [place], [place]. This information is not intended to replace advice given to you by your health care provider. Make sure you discuss any questions you have with your health care provider.  Upper Respiratory Infection, Adult An upper respiratory infection (URI) is also sometimes known as the common cold. The upper respiratory tract includes the nose, sinuses, throat, trachea, and bronchi. Bronchi are the airways leading to the lungs. Most people improve within 1 week, but symptoms  can last up to 2 weeks. A residual cough may last even longer.  CAUSES Many different viruses can infect the tissues lining the upper respiratory tract. The tissues become irritated and inflamed and often become very moist. Mucus production is also common. A cold is contagious. You can easily spread the  virus to others by oral contact. This includes kissing, sharing a glass, coughing, or sneezing. Touching your mouth or nose and then touching a surface, which is then touched by another person, can also spread the virus. SYMPTOMS  Symptoms typically develop 1 to 3 days after you come in contact with a cold virus. Symptoms vary from person to person. They may include:  Runny nose.  Sneezing.  Nasal congestion.  Sinus irritation.  Sore throat.  Loss of voice (laryngitis).  Cough.  Fatigue.  Muscle aches.  Loss of appetite.  Headache.  Low-grade fever. DIAGNOSIS  You might diagnose your own cold based on familiar symptoms, since most people get a cold 2 to 3 times a year. Your caregiver can confirm this based on your exam. Most importantly, your caregiver can check that your symptoms are not due to another disease such as strep throat, sinusitis, pneumonia, asthma, or epiglottitis. Blood tests, throat tests, and X-rays are not necessary to diagnose a common cold, but they may sometimes be helpful in excluding other more serious diseases. Your caregiver will decide if any further tests are required. RISKS AND COMPLICATIONS  You may be at risk for a more severe case of the common cold if you smoke cigarettes, have chronic heart disease (such as heart failure) or lung disease (such as asthma), or if you have a weakened immune system. The very young and very old are also at risk for more serious infections. Bacterial sinusitis, middle ear infections, and bacterial pneumonia can complicate the common cold. The common cold can worsen asthma and chronic obstructive pulmonary disease (COPD). Sometimes, these complications can require emergency medical care and may be life-threatening. PREVENTION  The best way to protect against getting a cold is to practice good hygiene. Avoid oral or hand contact with people with cold symptoms. Wash your hands often if contact occurs. There is no clear evidence  that vitamin C, vitamin E, echinacea, or exercise reduces the chance of developing a cold. However, it is always recommended to get plenty of rest and practice good nutrition. TREATMENT  Treatment is directed at relieving symptoms. There is no cure. Antibiotics are not effective, because the infection is caused by a virus, not by bacteria. Treatment may include:  Increased fluid intake. Sports drinks offer valuable electrolytes, sugars, and fluids.  Breathing heated mist or steam (vaporizer or shower).  Eating chicken soup or other clear broths, and maintaining good nutrition.  Getting plenty of rest.  Using gargles or lozenges for comfort.  Controlling fevers with ibuprofen or acetaminophen as directed by your caregiver.  Increasing usage of your inhaler if you have asthma. Zinc gel and zinc lozenges, taken in the first 24 hours of the common cold, can shorten the duration and lessen the severity of symptoms. Pain medicines may help with fever, muscle aches, and throat pain. A variety of non-prescription medicines are available to treat congestion and runny nose. Your caregiver can make recommendations and may suggest nasal or lung inhalers for other symptoms.  HOME CARE INSTRUCTIONS   Only take over-the-counter or prescription medicines for pain, discomfort, or fever as directed by your caregiver.  Use a warm mist humidifier or inhale steam  from a shower to increase air moisture. This may keep secretions moist and make it easier to breathe.  Drink enough water and fluids to keep your urine clear or pale yellow.  Rest as needed.  Return to work when your temperature has returned to normal or as your caregiver advises. You may need to stay home longer to avoid infecting others. You can also use a face mask and careful hand washing to prevent spread of the virus. SEEK MEDICAL CARE IF:   After the first few days, you feel you are getting worse rather than better.  You need your  caregiver's advice about medicines to control symptoms.  You develop chills, worsening shortness of breath, or brown or red sputum. These may be signs of pneumonia.  You develop yellow or brown nasal discharge or pain in the face, especially when you bend forward. These may be signs of sinusitis.  You develop a fever, swollen neck glands, pain with swallowing, or white areas in the back of your throat. These may be signs of strep throat. SEEK IMMEDIATE MEDICAL CARE IF:   You have a fever.  You develop severe or persistent headache, ear pain, sinus pain, or chest pain.  You develop wheezing, a prolonged cough, cough up blood, or have a change in your usual mucus (if you have chronic lung disease).  You develop sore muscles or a stiff neck. Document Released: 05/26/2001 Document Revised: 02/22/2012 Document Reviewed: 03/07/2014 [facility] Patient Information 2015 [place], [place]. This information is not intended to replace advice given to you by your health care provider. Make sure you discuss any questions you have with your health care provider.

## 2015-02-26 NOTE — Progress Notes (Signed)
Chief Complaint: URI   First Provider Initiated Contact with Patient 02/26/15 1321     SUBJECTIVE HPI: Brenda Spencer is a 24 y.o. [phone] at [redacted]w[redacted]d by LMP who presents with non-productive cough, congestion, headache, rhinorrhea, dizziness, ear pain and vomiting of 2 days duration. She has taken tylenol without relief. She denies fever, diarrhea, syncope, vaginal discharge, bleeding, and changes in urine or stool. She is able to eat and drink normally. She has appointment to start prenatal care on 03/01/15.   Past Medical History  Diagnosis Date  . Eczema   . CHF (congestive heart failure)     RESOLVED; HAD CARDIAC EVAL   . Congestive heart failure 2011    related to preeclampsia  . Hypothyroidism AGE 75     NO TX  . Pregnancy induced hypertension 2011  . Preterm labor 2011  . Infection     UTI OCC  . Infection 2013    [place]  . Headache(784.0) 2011    MIGRAQINES; OTC  . Asthma 2009; CORNERSTONE    INHALER PRN  . Vaginal Pap smear, abnormal     colpo  . Hyperthyroidism    OB History  Gravida Para Term Preterm AB SAB TAB Ectopic Multiple Living  5 3 2 1 1 1    3     # Outcome Date GA Lbr Len/2nd Weight Sex Delivery Anes PTL Lv  5 Current           4 Term 05/22/14 [redacted]w[redacted]d 02:37 / 00:07 3.255 kg (7 lb 2.8 oz) M Vag-Spont EPI  Y  3 Term 04/29/13 [redacted]w[redacted]d 10:13 / 00:10 2.96 kg (6 lb 8.4 oz) F Vag-Spont EPI  Y  2 SAB 04/2012 109w0d         1 Preterm 04/14/10 [redacted]w[redacted]d  2.211 kg (4 lb 14 oz) F Vag-Spont EPI N Y     Comments: induced PIH, CHF after delivery    Obstetric Comments  2011 PIH;IOL; PP CHF-HOSPITALIZED X 1 WEEK   Past Surgical History  Procedure Laterality Date  . Tonsillectomy    . Wisdom tooth extraction    . Tear duct probing    . Cholecystectomy     History   Social History  . Marital Status: Married    Spouse Name: CHRISTOPHER  . Number of Children: 1  . Years of Education: 12   Occupational History  . HOMEMAKER    Social History Main Topics  . Smoking  status: Never Smoker   . Smokeless tobacco: Never Used  . Alcohol Use: No  . Drug Use: No  . Sexual Activity:    Partners: Male    Birth Control/ Protection: None   Other Topics Concern  . Not on file   Social History Narrative   No current facility-administered medications on file prior to encounter.   Current Outpatient Prescriptions on File Prior to Encounter  Medication Sig Dispense Refill  . metoCLOPramide (REGLAN) 10 MG tablet Take 1 tablet (10 mg total) by mouth every 6 (six) hours. 30 tablet 0  . albuterol (PROVENTIL HFA;VENTOLIN HFA) 108 (90 BASE) MCG/ACT inhaler Inhale 2 puffs into the lungs every 6 (six) hours as needed for wheezing. 1 Inhaler 4  . nitrofurantoin, macrocrystal-monohydrate, (MACROBID) 100 MG capsule Take 1 capsule (100 mg total) by mouth 2 (two) times daily. (Patient not taking: Reported on 02/26/2015) 14 capsule 0   No Known Allergies  ROS: Positive for: non-productive cough, congestion, headache, rhinorrhea, dizziness, ear pain and vomiting. Negative for:  fever,  diarrhea, syncope, vaginal discharge, bleeding, and changes in urine or stool  OBJECTIVE Blood pressure 120/75, pulse 94, temperature 98 F (36.7 C), temperature source Oral, resp. rate 18, height 5\' 1"  (1.549 m), weight 89.812 kg (198 lb), last menstrual period 12/18/2014, SpO2 100 %, unknown if currently breastfeeding. GENERAL: Well-developed, well-nourished female in no acute distress.  HEENT: Normocephalic, cervical lymphadenopathy HEART: normal rate RESP: normal effort, lungs clear to auscultation bilaterally ABDOMEN: Soft, non-tender EXTREMITIES: Nontender, no edema NEURO: Alert and oriented   LAB RESULTS Results for orders placed or performed during the hospital encounter of 02/26/15 (from the past 24 hour(s))  Urinalysis, Routine w reflex microscopic     Status: Abnormal   Collection Time: 02/26/15  1:00 PM  Result Value Ref Range   Color, Urine YELLOW YELLOW   APPearance HAZY  (A) CLEAR   Specific Gravity, Urine >1.030 (H) 1.005 - 1.030   pH 6.0 5.0 - 8.0   Glucose, UA NEGATIVE NEGATIVE mg/dL   Hgb urine dipstick NEGATIVE NEGATIVE   Bilirubin Urine NEGATIVE NEGATIVE   Ketones, ur NEGATIVE NEGATIVE mg/dL   Protein, ur NEGATIVE NEGATIVE mg/dL   Urobilinogen, UA 1.0 0.0 - 1.0 mg/dL   Nitrite NEGATIVE NEGATIVE   Leukocytes, UA SMALL (A) NEGATIVE  Urine microscopic-add on     Status: Abnormal   Collection Time: 02/26/15  1:00 PM  Result Value Ref Range   Squamous Epithelial / LPF MANY (A) RARE   WBC, UA 11-20 <3 WBC/hpf   Bacteria, UA MANY (A) RARE   Urine-Other MUCOUS PRESENT      MAU COURSE  ASSESSMENT 1. URI, acute   2. Otitis Externa  PLAN Discharge home with Promethazine with Codeine for respiratory symptoms and Otic Care Solution for Otitis Externa.      Follow-up Information    Follow up with [facility].   Specialty:  Obstetrics and Gynecology   Why:  Keep your scheduled prenatal appointment   Contact information:   [place] [phone]       Medication List    STOP taking these medications        nitrofurantoin (macrocrystal-monohydrate) 100 MG capsule  Commonly known as:  MACROBID      TAKE these medications        acetaminophen 325 MG tablet  Commonly known as:  TYLENOL  Take 650 mg by mouth every 6 (six) hours as needed.     albuterol 108 (90 BASE) MCG/ACT inhaler  Commonly known as:  PROVENTIL HFA;VENTOLIN HFA  Inhale 2 puffs into the lungs every 6 (six) hours as needed for wheezing.     metoCLOPramide 10 MG tablet  Commonly known as:  REGLAN  Take 1 tablet (10 mg total) by mouth every 6 (six) hours.     OTIC CARE 5.4-1.4-0.0097 % Soln  Place 2 drops in ear(s) 2 (two) times daily.     promethazine-codeine 6.25-10 MG/5ML syrup  Commonly known as:  PHENERGAN with CODEINE  Take 5 mLs by mouth every 6 (six) hours as needed for cough.         Lajean Manes, Med Student 02/26/2015  1:58 PM

## 2015-02-27 ENCOUNTER — Other Ambulatory Visit: Payer: Self-pay | Admitting: *Deleted

## 2015-02-27 DIAGNOSIS — H6690 Otitis media, unspecified, unspecified ear: Secondary | ICD-10-CM

## 2015-02-27 MED ORDER — ANTIPYRINE-BENZOCAINE 5.4-1.4 % OT SOLN: 2.0000 [drp] | Freq: Two times a day (BID) | OTIC | Status: DC

## 2015-02-28 ENCOUNTER — Telehealth: Payer: Self-pay | Admitting: *Deleted

## 2015-02-28 ENCOUNTER — Other Ambulatory Visit (HOSPITAL_COMMUNITY)
Admission: RE | Admit: 2015-02-28 | Discharge: 2015-02-28 | Disposition: A | Discharge status: Home / Self Care | Payer: Medicaid Other | Source: Ambulatory Visit | Attending: Physician Assistant | Admitting: Physician Assistant

## 2015-02-28 ENCOUNTER — Encounter: Payer: Self-pay | Admitting: Physician Assistant

## 2015-02-28 ENCOUNTER — Ambulatory Visit (INDEPENDENT_AMBULATORY_CARE_PROVIDER_SITE_OTHER): Payer: Medicaid Other | Admitting: Physician Assistant

## 2015-02-28 ENCOUNTER — Other Ambulatory Visit: Payer: Self-pay | Admitting: Physician Assistant

## 2015-02-28 VITALS — BP 117/77 | HR 97 | Temp 98.6°F | Wt 196.4 lb

## 2015-02-28 DIAGNOSIS — Z01419 Encounter for gynecological examination (general) (routine) without abnormal findings: Secondary | ICD-10-CM | POA: Diagnosis present

## 2015-02-28 DIAGNOSIS — Z8679 Personal history of other diseases of the circulatory system: Secondary | ICD-10-CM

## 2015-02-28 DIAGNOSIS — Z113 Encounter for screening for infections with a predominantly sexual mode of transmission: Secondary | ICD-10-CM | POA: Diagnosis present

## 2015-02-28 DIAGNOSIS — O3680X1 Pregnancy with inconclusive fetal viability, fetus 1: Secondary | ICD-10-CM

## 2015-02-28 DIAGNOSIS — Z3481 Encounter for supervision of other normal pregnancy, first trimester: Secondary | ICD-10-CM

## 2015-02-28 DIAGNOSIS — O0990 Supervision of high risk pregnancy, unspecified, unspecified trimester: Secondary | ICD-10-CM | POA: Insufficient documentation

## 2015-02-28 DIAGNOSIS — Z8759 Personal history of other complications of pregnancy, childbirth and the puerperium: Secondary | ICD-10-CM | POA: Insufficient documentation

## 2015-02-28 DIAGNOSIS — Z3682 Encounter for antenatal screening for nuchal translucency: Secondary | ICD-10-CM

## 2015-02-28 LAB — POCT URINALYSIS DIP (DEVICE)
Glucose, UA: NEGATIVE mg/dL
Glucose, UA: NEGATIVE mg/dL
HGB URINE DIPSTICK: NEGATIVE
HGB URINE DIPSTICK: NEGATIVE
Ketones, ur: NEGATIVE mg/dL
Ketones, ur: NEGATIVE mg/dL
Leukocytes, UA: NEGATIVE
Leukocytes, UA: NEGATIVE
NITRITE: NEGATIVE
NITRITE: NEGATIVE
Protein, ur: 30 mg/dL — AB
Protein, ur: 30 mg/dL — AB
UROBILINOGEN UA: 1 mg/dL (ref 0.0–1.0)
UROBILINOGEN UA: 1 mg/dL (ref 0.0–1.0)
pH: 5.5 (ref 5.0–8.0)
pH: 6 (ref 5.0–8.0)

## 2015-02-28 LAB — US OB COMP LESS 14 WKS

## 2015-02-28 NOTE — Telephone Encounter (Signed)
Message received by [facility] stating that Rx was sent yesterday for Auralgan. This medication and its generic equivalent are no longer available. Pt needs alternate Rx.  Per chart review, pt has clinic appt today @ 1420 and this need will be addressed at that time.

## 2015-02-28 NOTE — Progress Notes (Signed)
1st trimester screen scheduled for April 1 at 1015

## 2015-02-28 NOTE — Progress Notes (Signed)
Bedside US for viability = single IUP, FHR = 168 bpm per PW doppler, FM present.

## 2015-02-28 NOTE — Progress Notes (Signed)
Initial visit.  Initial labs today with early glucola given BMI and pap smear.  New OB packet given.  Wants genetic testing.  Declines flu.

## 2015-02-28 NOTE — Progress Notes (Signed)
Subjective:    Brenda Spencer is a [id number] [redacted]w[redacted]d being seen today for her first obstetrical visit.  Her obstetrical history is significant for pregnancy induced hypertension. 2 full term pregnancies followed the pregnancy with PIH and preterm delivery.   Patient does not intend to breast feed. Pregnancy history fully reviewed.  Patient reports no complaints.  She was seen in [facility] a couple days ago for URI.  She reports feeling much better.    Filed Vitals:   02/28/15 1431  BP: 117/77  Pulse: 97  Temp: 98.6 F (37 C)  Weight: 196 lb 6.4 oz (89.086 kg)    HISTORY: OB History  Gravida Para Term Preterm AB SAB TAB Ectopic Multiple Living  5 3 2 1 1 1    3     # Outcome Date GA Lbr Len/2nd Weight Sex Delivery Anes PTL Lv  5 Current           4 Term 05/22/14 [redacted]w[redacted]d 02:37 / 00:07 7 lb 2.8 oz (3.255 kg) [name] EPI  Y  3 Term 04/29/13 [redacted]w[redacted]d 10:13 / 00:10 6 lb 8.4 oz (2.96 kg) F Vag-Spont EPI  Y  2 SAB 04/2012 [redacted]w[redacted]d         1 Preterm 04/14/10 [redacted]w[redacted]d  4 lb 14 oz (2.211 kg) F Vag-Spont EPI N Y     Comments: induced PIH, CHF after delivery    Obstetric Comments  2011 PIH;IOL; PP CHF-HOSPITALIZED X 1 WEEK   Past Medical History  Diagnosis Date  . Eczema   . CHF (congestive heart failure)     RESOLVED; HAD CARDIAC EVAL   . Congestive heart failure 2011    related to preeclampsia  . Hypothyroidism AGE 24     NO TX  . Pregnancy induced hypertension 2011  . Preterm labor 2011  . Infection     UTI OCC  . Infection 2013    [place]  . Headache(784.0) 2011    MIGRAQINES; OTC  . Asthma 2009; [facility]    INHALER PRN  . Vaginal Pap smear, abnormal     colpo  . Hyperthyroidism    Past Surgical History  Procedure Laterality Date  . Tonsillectomy    . Wisdom tooth extraction    . Tear duct probing    . Cholecystectomy     Family History  Problem Relation Age of Onset  . Anesthesia problems Neg Hx   . Other Neg Hx   . Asthma Brother   . Learning disabilities Brother   .  Asthma Daughter   . Learning disabilities Daughter   . Seizures Daughter   . Learning disabilities Other   . Colon cancer Maternal Grandmother   . Cancer Maternal Grandmother     colon     Exam    Uterus:   gravid  Pelvic Exam:    Perineum: No Hemorrhoids, Normal Perineum   Vulva: normal   Vagina:  normal mucosa, normal discharge   pH:    Cervix: no bleeding following Pap and no cervical motion tenderness   Adnexa: normal adnexa and no mass, fullness, tenderness   Bony Pelvis: average  System: Breast:     Skin: normal coloration and turgor, no rashes    Neurologic: oriented, negative, grossly non-focal   Extremities: normal strength, tone, and muscle mass   HEENT extra ocular movement intact   Mouth/Teeth mucous membranes moist, pharynx normal without lesions and dental hygiene good   Neck supple and no masses   Cardiovascular: regular rate  and rhythm   Respiratory:  appears well, vitals normal, no respiratory distress, acyanotic, normal RR, ear and throat exam is normal, neck free of mass or lymphadenopathy, chest clear, no wheezing, crepitations, rhonchi, normal symmetric air entry   Abdomen: soft, non-tender; bowel sounds normal; no masses,  no organomegaly   Urinary: urethral meatus normal      Assessment:    Pregnancy: [id number] Patient Active Problem List   Diagnosis Date Noted  . Encounter for supervision of other normal pregnancy in first trimester 02/28/2015  . Second hand tobacco smoke exposure 03/09/2013  . S/P laparoscopic cholecystectomy 02/06/2013  . History of congestive heart failure 01/09/2013  . Mild persistent asthma 11/18/2012        Plan:     Initial labs drawn. Prenatal vitamins. Problem list reviewed and updated. Genetic Screening discussed First Screen: ordered.  Ultrasound discussed; fetal survey: requested.  Follow up in 4 weeks. 90% of 45 min visit spent on counseling and coordination of care.   She was prescribed Auralgan in [facility]  but his medication is no longer manufactured.  Pt reports improvement in symptoms and no alternative is prescribed.      Paticia Stack 02/28/2015

## 2015-02-28 NOTE — Patient Instructions (Signed)
Prenatal Care  WHAT IS PRENATAL CARE?  Prenatal care means health care during your pregnancy, before your baby is born. It is very important to take care of yourself and your baby during your pregnancy by:   Getting early prenatal care. If you know you are pregnant, or think you might be pregnant, call your health care provider as soon as possible. Schedule a visit for a prenatal exam.  Getting regular prenatal care. Follow your health care provider's schedule for blood and other necessary tests. Do not miss appointments.  Doing everything you can to keep yourself and your baby healthy during your pregnancy.  Getting complete care. Prenatal care should include evaluation of the medical, dietary, educational, psychological, and social needs of you and your significant other. The medical and genetic history of your family and the family of your baby's father should be discussed with your health care provider.  Discussing with your health care provider:  Prescription, over-the-counter, and herbal medicines that you take.  Any history of substance abuse, alcohol use, smoking, and illegal drug use.  Any history of domestic abuse and violence.  Immunizations you have received.  Your nutrition and diet.  The amount of exercise you do.  Any environmental and occupational hazards to which you are exposed.  History of sexually transmitted infections for both you and your partner.  Previous pregnancies you have had. WHY IS PRENATAL CARE SO IMPORTANT?  By regularly seeing your health care provider, you help ensure that problems can be identified early so that they can be treated as soon as possible. Other problems might be prevented. Many studies have shown that early and regular prenatal care is important for the health of mothers and their babies.  HOW CAN I TAKE CARE OF MYSELF WHILE I AM PREGNANT?  Here are ways to take care of yourself and your baby:   Start or continue taking your  multivitamin with 400 micrograms (mcg) of folic acid every day.  Get early and regular prenatal care. It is very important to see a health care provider during your pregnancy. Your health care provider will check at each visit to make sure that you and your baby are healthy. If there are any problems, action can be taken right away to help you and your baby.  Eat a healthy diet that includes:  Fruits.  Vegetables.  Foods low in saturated fat.  Whole grains.  Calcium-rich foods, such as milk, yogurt, and hard cheeses.  Drink 6-8 glasses of liquids a day.  Unless your health care provider tells you not to, try to be physically active for 30 minutes, most days of the week. If you are pressed for time, you can get your activity in through 10-minute segments, three times a day.  Do not smoke, drink alcohol, or use drugs. These can cause long-term damage to your baby. Talk with your health care provider about steps to take to stop smoking. Talk with a member of your faith community, a counselor, a trusted friend, or your health care provider if you are concerned about your alcohol or drug use.  Ask your health care provider before taking any medicine, even over-the-counter medicines. Some medicines are not safe to take during pregnancy.  Get plenty of rest and sleep.  Avoid hot tubs and saunas during pregnancy.  Do not have X-rays taken unless absolutely necessary and with the recommendation of your health care provider. A lead shield can be placed on your abdomen to protect your baby when   X-rays are taken in other parts of your body.  Do not empty the cat litter when you are pregnant. It may contain a parasite that causes an infection called toxoplasmosis, which can cause birth defects. Also, use gloves when working in garden areas used by cats.  Do not eat uncooked or undercooked meats or fish.  Do not eat soft, mold-ripened cheeses (Brie, Camembert, and chevre) or soft, blue-veined  cheese (Danish blue and Roquefort).  Stay away from toxic chemicals like:  Insecticides.  Solvents (some cleaners or paint thinners).  Lead.  Mercury.  Sexual intercourse may continue until the end of the pregnancy, unless you have a medical problem or there is a problem with the pregnancy and your health care provider tells you not to.  Do not wear high-heel shoes, especially during the second half of the pregnancy. You can lose your balance and fall.  Do not take long trips, unless absolutely necessary. Be sure to see your health care provider before going on the trip.  Do not sit in one position for more than 2 hours when on a trip.  Take a copy of your medical records when going on a trip. Know where a hospital is located in the city you are visiting, in case of an emergency.  Most dangerous household products will have pregnancy warnings on their labels. Ask your health care provider about products if you are unsure.  Limit or eliminate your caffeine intake from coffee, tea, sodas, medicines, and chocolate.  Many women continue working through pregnancy. Staying active might help you stay healthier. If you have a question about the safety or the hours you work at your particular job, talk with your health care provider.  Get informed:  Read books.  Watch videos.  Go to childbirth classes for you and your significant other.  Talk with experienced moms.  Ask your health care provider about childbirth education classes for you and your partner. Classes can help you and your partner prepare for the birth of your baby.  Ask about a baby doctor (pediatrician) and methods and pain medicine for labor, delivery, and possible cesarean delivery. HOW OFTEN SHOULD I SEE MY HEALTH CARE PROVIDER DURING PREGNANCY?  Your health care provider will give you a schedule for your prenatal visits. You will have visits more often as you get closer to the end of your pregnancy. An average  pregnancy lasts about 40 weeks.  A typical schedule includes visiting your health care provider:   About once each month during your first 6 months of pregnancy.  Every 2 weeks during the next 2 months.  Weekly in the last month, until the delivery date. Your health care provider will probably want to see you more often if:  You are older than 35 years.  Your pregnancy is high risk because you have certain health problems or problems with the pregnancy, such as:  Diabetes.  High blood pressure.  The baby is not growing on schedule, according to the dates of the pregnancy. Your health care provider will do special tests to make sure you and your baby are not having any serious problems. WHAT HAPPENS DURING PRENATAL VISITS?   At your first prenatal visit, your health care provider will do a physical exam and talk to you about your health history and the health history of your partner and your family. Your health care provider will be able to tell you what date to expect your baby to be born on.  Your   first physical exam will include checks of your blood pressure, measurements of your height and weight, and an exam of your pelvic organs. Your health care provider will do a Pap test if you have not had one recently and will do cultures of your cervix to make sure there is no infection.  At each prenatal visit, there will be tests of your blood, urine, blood pressure, weight, and the progress of the baby will be checked.  At your later prenatal visits, your health care provider will check how you are doing and how your baby is developing. You may have a number of tests done as your pregnancy progresses.  Ultrasound exams are often used to check on your baby's growth and health.  You may have more urine and blood tests, as well as special tests, if needed. These may include amniocentesis to examine fluid in the pregnancy sac, stress tests to check how the baby responds to contractions, or a  biophysical profile to measure your baby's well-being. Your health care provider will explain the tests and why they are necessary.  You should be tested for high blood sugar (gestational diabetes) between the 24th and 28th weeks of your pregnancy.  You should discuss with your health care provider your plans to breastfeed or bottle-feed your baby.  Each visit is also a chance for you to learn about staying healthy during pregnancy and to ask questions. Document Released: 12/03/2003 Document Revised: 12/05/2013 Document Reviewed: 02/14/2014 ExitCare Patient Information 2015 [employer]. This information is not intended to replace advice given to you by your health care provider. Make sure you discuss any questions you have with your health care provider.  

## 2015-03-01 ENCOUNTER — Encounter: Payer: Self-pay | Admitting: Physician Assistant

## 2015-03-01 LAB — PRENATAL PROFILE (SOLSTAS)
ANTIBODY SCREEN: NEGATIVE
BASOS ABS: 0 10*3/uL (ref 0.0–0.1)
Basophils Relative: 0 % (ref 0–1)
Eosinophils Absolute: 0.1 10*3/uL (ref 0.0–0.7)
Eosinophils Relative: 1 % (ref 0–5)
HCT: 37.4 % (ref 36.0–46.0)
HIV 1&2 Ab, 4th Generation: NONREACTIVE
Hemoglobin: 12.2 g/dL (ref 12.0–15.0)
Hepatitis B Surface Ag: NEGATIVE
LYMPHS PCT: 21 % (ref 12–46)
Lymphs Abs: 1.9 10*3/uL (ref 0.7–4.0)
MCH: 27.6 pg (ref 26.0–34.0)
MCHC: 32.6 g/dL (ref 30.0–36.0)
MCV: 84.6 fL (ref 78.0–100.0)
MONO ABS: 0.6 10*3/uL (ref 0.1–1.0)
MPV: 11.4 fL (ref 8.6–12.4)
Monocytes Relative: 7 % (ref 3–12)
NEUTROS PCT: 71 % (ref 43–77)
Neutro Abs: 6.5 10*3/uL (ref 1.7–7.7)
PLATELETS: 280 10*3/uL (ref 150–400)
RBC: 4.42 MIL/uL (ref 3.87–5.11)
RDW: 15.8 % — AB (ref 11.5–15.5)
RUBELLA: 2.36 {index} — AB (ref <0.90)
Rh Type: POSITIVE
WBC: 9.1 10*3/uL (ref 4.0–10.5)

## 2015-03-01 LAB — CULTURE, OB URINE
Colony Count: NO GROWTH
ORGANISM ID, BACTERIA: NO GROWTH

## 2015-03-01 LAB — GLUCOSE TOLERANCE, 1 HOUR (50G) W/O FASTING: Glucose, 1 Hour GTT: 92 mg/dL (ref 70–140)

## 2015-03-02 LAB — PRESCRIPTION MONITORING PROFILE (19 PANEL)
AMPHETAMINE/METH: NEGATIVE ng/mL
BENZODIAZEPINE SCREEN, URINE: NEGATIVE ng/mL
BUPRENORPHINE, URINE: NEGATIVE ng/mL
Barbiturate Screen, Urine: NEGATIVE ng/mL
Cannabinoid Scrn, Ur: NEGATIVE ng/mL
Carisoprodol, Urine: NEGATIVE ng/mL
Cocaine Metabolites: NEGATIVE ng/mL
Creatinine, Urine: 278.36 mg/dL (ref >20.0)
ECSTASY: NEGATIVE ng/mL
Fentanyl, Ur: NEGATIVE ng/mL
METHAQUALONE SCREEN (URINE): NEGATIVE ng/mL
Meperidine, Ur: NEGATIVE ng/mL
Methadone Screen, Urine: NEGATIVE ng/mL
Nitrites, Initial: NEGATIVE ug/mL
OPIATE SCREEN, URINE: NEGATIVE ng/mL
OXYCODONE SCRN UR: NEGATIVE ng/mL
PH URINE, INITIAL: 5.8 pH (ref 4.5–8.9)
PHENCYCLIDINE, UR: NEGATIVE ng/mL
Propoxyphene: NEGATIVE ng/mL
TAPENTADOLUR: NEGATIVE ng/mL
TRAMADOL UR: NEGATIVE ng/mL
Zolpidem, Urine: NEGATIVE ng/mL

## 2015-03-04 LAB — CYTOLOGY - PAP

## 2015-03-15 ENCOUNTER — Encounter (HOSPITAL_COMMUNITY): Payer: Self-pay

## 2015-03-15 ENCOUNTER — Ambulatory Visit (HOSPITAL_COMMUNITY)
Admission: RE | Admit: 2015-03-15 | Discharge: 2015-03-15 | Disposition: A | Discharge status: Home / Self Care | Payer: Medicaid Other | Source: Ambulatory Visit | Attending: Physician Assistant | Admitting: Physician Assistant

## 2015-03-15 ENCOUNTER — Other Ambulatory Visit: Payer: Self-pay | Admitting: Physician Assistant

## 2015-03-15 DIAGNOSIS — Z3682 Encounter for antenatal screening for nuchal translucency: Secondary | ICD-10-CM

## 2015-03-15 DIAGNOSIS — O351XX0 Maternal care for (suspected) chromosomal abnormality in fetus, not applicable or unspecified: Secondary | ICD-10-CM | POA: Diagnosis not present

## 2015-03-15 DIAGNOSIS — Z315 Encounter for genetic counseling: Secondary | ICD-10-CM | POA: Insufficient documentation

## 2015-03-15 DIAGNOSIS — O352XX0 Maternal care for (suspected) hereditary disease in fetus, not applicable or unspecified: Secondary | ICD-10-CM

## 2015-03-15 DIAGNOSIS — Z8489 Family history of other specified conditions: Secondary | ICD-10-CM | POA: Insufficient documentation

## 2015-03-15 DIAGNOSIS — Z3A12 12 weeks gestation of pregnancy: Secondary | ICD-10-CM | POA: Insufficient documentation

## 2015-03-15 DIAGNOSIS — Z8279 Family history of other congenital malformations, deformations and chromosomal abnormalities: Secondary | ICD-10-CM | POA: Insufficient documentation

## 2015-03-15 LAB — ROUTINE CHROMOSOME - KARYOTYPE + FISH

## 2015-03-15 NOTE — Progress Notes (Addendum)
Genetic Counseling  High-Risk Gestation Note  Appointment Date:  03/15/2015 Referred By: Paticia Stack, * Date of Birth:  [birth year]   Pregnancy History: [id number] Estimated Date of Delivery: 09/24/15 Estimated Gestational Age: [redacted]w[redacted]d Attending: Renella Cunas, MD   I met with [name] for genetic counseling because of a family history of a chromosome condition in her previous daughter.   We began by reviewing the family history in detail. Brenda Spencer reported that she and the father of the pregnancy have three children together. Their second daughter, Brenda Spencer, has 1q21.1 microdeletion syndrome. She was evaluated and diagnosed through [facility]. The patient reported that Brenda Spencer had congenital cataracts and has developmental delay. She will be 24 years old in May. No additional relatives have been tested for this same microdeletion. The patient reported that their oldest child, a daughter who will be 43 years old in May, has an appointment scheduled with [facility] later this year (approximately in November). The patient reported that this daughter also has developmental delay and receives therapies, including speech therapy through school. The patient reported that she has no history of specific medical issues and that she was on the A, B honor roll while in school. Her husband possibly has a history of learning delays. Brenda Spencer had limited information regarding his history at the time of today's visit. Their 25 month old son is reportedly meeting his milestones appropriately and has no health issues. The patient reported additional relatives in both family histories with learning delays. She reported that her father was "slow," and that her brother and maternal half-sister but required individual education plans while in school. For the father of the pregnancy, his maternal half-brother receives a disability check, but the patient does not  know more specific information regarding his disability.   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 This means that missing genetic material was detected on one copy of chromosome one from the long arm of the chromosome (denoted as "q") at band level 21.1. The deleted genetic material was detected at region 1q21.1 to 1q21.2, which is approximately 1.8 Mb in size. This deletion contains at least 14 genes.    We reviewed that chromosomes are the inherited structures that contain our instructions for development.  Genes are the smaller units of genetic information contained within our chromosomes.  Each cell of the body usually has 46 chromosomes, matched up into 23 pairs.  The last pair is called the sex chromosomes and determines gender.  Typically, a female has an X and a Y chromosome, while a female has two X chromosomes.  Any change in the number or structure of chromosomes can increase the risk of problems in the physical and mental development of a pregnancy.  The 1q21.1 microdeletion has been reported to have various clinical findings including microcephaly, mild intellectual disability, mildly dysmorphic facial features, eye abnormalities, and less commonly cardiac defects, genitourinary anomalies, skeletal malformations, and seizures. Psychiatric and behavioral abnormalities have also been reported with variable expressivity including autism spectrum disorders, attention deficit hyperactivity disorder, sleep disturbances, and schizophrenia. We discussed that the features are described to be variable even among relatives.   Currently little information is available regarding penetrance of 1q21.1 microdeletion. However, reduced penetrance is suspected given that it has been observed in individuals with normal phenotype or mild manifestations. It is reported that 18%-50% of 1q21.1 deletions occur de novo and are not inherited from  a  parent. For individuals who have the 1q21.1 deletion, it follows autosomal dominant inheritance, meaning each offspring of an individual with the deletion has a 1 in 2 (50%) chance to inherit the deletion. Thus, if one parent carries the 1q21.1 deletion, recurrence risk for each pregnancy for that individual would be 50% (1 in 2). If neither parent carries the microdeletion, recurrence risk for a pregnancy is low, likely <1%, but is greater than the general population risk given that possibility of germline mosaicism (some cells containing the deletion and some cells without the deletion in the egg or sperm cells) cannot be eliminated. We discussed the option of parental blood analysis from [name] and the father of the pregnancy to assess for the presence of the 1q21.1 deletion in order to refine recurrence risk for the current pregnancy. If either she or he also has the 1q21.1 microdeletion, then recurrence risk for the current pregnancy is a 1 in 2 (50%). If neither of them carries the microdeletion, then recurrence risk is estimated to be less than 1%.   Brenda Spencer stated that she and the father of the baby had planned to pursue this previously but that the results initially became available around the time of their son's birth, and they have not yet followed up on this. After careful consideration, Brenda Spencer elected to pursue blood analysis for herself at the time of today's visit for the 1q21.1 microdeletion reported to be present in her daughter. The father of the pregnancy may return with the patient for her appointment next week and elect to pursue testing at that time. We will contact the patient once these results are available.   We discussed that prenatal diagnosis for the 1q21.1 microdeletion present in the couple's daughter is available via chorionic villus sampling (CVS) and amniocentesis. We reviewed the risks, benefits, and limitations of these testing options  including the associated 1 in 144 risk for complications for CVS and the associated 1 in [phone] risk for complications from amniocentesis. After careful consideration, Brenda Spencer declined CVS at this time. She stated that she would first like to pursue parental blood analysis for herself and the father of the pregnancy, and if one of them is identified to also have the 1q21 microdeletion then she would further consider amniocentesis. She stated that she would be interested in the information provided by amniocentesis for preparation purposes. She indicated that if neither she nor the father of the pregnancy are found to have the microdeletion then she would not likely be interested in prenatal diagnosis via amniocentesis.  The family histories were otherwise found to be noncontributory for birth defects, intellectual disability, and known genetic conditions. Without further information regarding the provided family history, an accurate genetic risk cannot be calculated. Further genetic counseling is warranted if more information is obtained.   We reviewed available screening and diagnostic options.  Regarding screening tests, we discussed the options of First screen and ultrasound.  She understands that screening tests are used to modify a patient's a priori risk for aneuploidy, typically based on age.  This estimate provides a pregnancy specific risk assessment. We discussed the risks, limitations, and benefits of each.  Ms. Graffius expressed interest in pursuing first trimester screening. Nuchal translucency measurement was not able to be obtained today. She is scheduled to return on 03/21/15 for second NT attempt and first trimester screening. She understands that first trimester screening only assesses for Down syndrome, trisomy 37 and 22 and does not  assess for other chromosome conditions. She understands that ultrasound cannot rule out all birth defects or genetic syndromes. The patient was  advised of this limitation.   I counseled Brenda Spencer regarding the above risks and available options.  The approximate face-to-face time with the genetic counselor was 35 minutes.  Brenda Oman, MS Certified Genetic Counselor 03/15/2015

## 2015-03-21 ENCOUNTER — Ambulatory Visit (HOSPITAL_COMMUNITY)
Admission: RE | Admit: 2015-03-21 | Discharge: 2015-03-21 | Disposition: A | Discharge status: Home / Self Care | Payer: Medicaid Other | Source: Ambulatory Visit | Attending: Physician Assistant | Admitting: Physician Assistant

## 2015-03-21 ENCOUNTER — Encounter (HOSPITAL_COMMUNITY): Payer: Self-pay

## 2015-03-21 DIAGNOSIS — Z36 Encounter for antenatal screening of mother: Secondary | ICD-10-CM | POA: Diagnosis present

## 2015-03-21 DIAGNOSIS — Z3682 Encounter for antenatal screening for nuchal translucency: Secondary | ICD-10-CM

## 2015-03-21 DIAGNOSIS — O09299 Supervision of pregnancy with other poor reproductive or obstetric history, unspecified trimester: Secondary | ICD-10-CM | POA: Insufficient documentation

## 2015-03-21 DIAGNOSIS — Z3A13 13 weeks gestation of pregnancy: Secondary | ICD-10-CM | POA: Insufficient documentation

## 2015-03-21 DIAGNOSIS — O09291 Supervision of pregnancy with other poor reproductive or obstetric history, first trimester: Secondary | ICD-10-CM | POA: Diagnosis not present

## 2015-03-21 DIAGNOSIS — Z369 Encounter for antenatal screening, unspecified: Secondary | ICD-10-CM | POA: Insufficient documentation

## 2015-03-26 ENCOUNTER — Other Ambulatory Visit (HOSPITAL_COMMUNITY): Payer: Self-pay | Admitting: Physician Assistant

## 2015-03-27 ENCOUNTER — Encounter: Payer: Self-pay | Admitting: Advanced Practice Midwife

## 2015-03-27 ENCOUNTER — Ambulatory Visit (INDEPENDENT_AMBULATORY_CARE_PROVIDER_SITE_OTHER): Payer: Medicaid Other | Admitting: Advanced Practice Midwife

## 2015-03-27 VITALS — BP 132/67 | HR 104 | Wt 196.4 lb

## 2015-03-27 DIAGNOSIS — R0600 Dyspnea, unspecified: Secondary | ICD-10-CM

## 2015-03-27 DIAGNOSIS — O09292 Supervision of pregnancy with other poor reproductive or obstetric history, second trimester: Secondary | ICD-10-CM

## 2015-03-27 DIAGNOSIS — Z8279 Family history of other congenital malformations, deformations and chromosomal abnormalities: Secondary | ICD-10-CM

## 2015-03-27 DIAGNOSIS — Z8489 Family history of other specified conditions: Secondary | ICD-10-CM

## 2015-03-27 DIAGNOSIS — Z8639 Personal history of other endocrine, nutritional and metabolic disease: Secondary | ICD-10-CM

## 2015-03-27 DIAGNOSIS — Z8679 Personal history of other diseases of the circulatory system: Secondary | ICD-10-CM

## 2015-03-27 DIAGNOSIS — R0789 Other chest pain: Secondary | ICD-10-CM

## 2015-03-27 LAB — POCT URINALYSIS DIP (DEVICE)
GLUCOSE, UA: NEGATIVE mg/dL
Hgb urine dipstick: NEGATIVE
KETONES UR: NEGATIVE mg/dL
Nitrite: NEGATIVE
PROTEIN: NEGATIVE mg/dL
SPECIFIC GRAVITY, URINE: 1.025 (ref 1.005–1.030)
Urobilinogen, UA: 2 mg/dL — ABNORMAL HIGH (ref 0.0–1.0)
pH: 7 (ref 5.0–8.0)

## 2015-03-27 NOTE — Progress Notes (Signed)
Pt reports some chest pains and abdominal cramping.

## 2015-03-27 NOTE — Progress Notes (Signed)
Reviewed NOB labs, normal first trimester screen. Had Genetic Counseling appt at [facility]. Being tested for microdeletion. Reports several episodes of chest tightness and shortness of breath over the past few weeks. None today. Has history of congestive heart failure after pregnancy in 2011. Recent symptoms feel similar. Followed up with cardiology. States she was told that all follow-up testing was normal and that she did not need to follow-up any further with cardiology. Does not remember what office she went to. Heart regular rate and rhythm, no murmurs rubs or gallops today. Recommend the patient go to maternity admissions for EKG and further evaluation. Patient refuses. Recommend that patient follow-up with cardiologist, but she has pregnancy Medicaid which will not cover cardiologist. Will consult with attending. Recommend the patient go to [facility] ED immediately if symptoms return. Patient agrees to do so.

## 2015-03-27 NOTE — Progress Notes (Signed)
Urine:  Bilirubin: small, urobilinogen: 2.0, Leukocytes: small

## 2015-03-27 NOTE — Patient Instructions (Signed)

## 2015-04-01 ENCOUNTER — Telehealth (HOSPITAL_COMMUNITY): Payer: Self-pay | Admitting: MS"

## 2015-04-01 ENCOUNTER — Encounter: Payer: Self-pay | Admitting: Obstetrics and Gynecology

## 2015-04-01 ENCOUNTER — Other Ambulatory Visit: Payer: Medicaid Other

## 2015-04-01 DIAGNOSIS — Z8759 Personal history of other complications of pregnancy, childbirth and the puerperium: Secondary | ICD-10-CM

## 2015-04-01 LAB — COMPREHENSIVE METABOLIC PANEL
ALBUMIN: 3.4 g/dL — AB (ref 3.5–5.2)
ALT: 9 U/L (ref 0–35)
AST: 9 U/L (ref 0–37)
Alkaline Phosphatase: 78 U/L (ref 39–117)
BUN: 4 mg/dL — ABNORMAL LOW (ref 6–23)
CHLORIDE: 103 meq/L (ref 96–112)
CO2: 25 meq/L (ref 19–32)
Calcium: 8.2 mg/dL — ABNORMAL LOW (ref 8.4–10.5)
Creat: 0.36 mg/dL — ABNORMAL LOW (ref 0.50–1.10)
GLUCOSE: 75 mg/dL (ref 70–99)
POTASSIUM: 4.2 meq/L (ref 3.5–5.3)
Sodium: 137 mEq/L (ref 135–145)
TOTAL PROTEIN: 6.1 g/dL (ref 6.0–8.3)
Total Bilirubin: 0.3 mg/dL (ref 0.2–1.2)

## 2015-04-01 LAB — TSH: TSH: 1.027 u[IU]/mL (ref 0.350–4.500)

## 2015-04-01 NOTE — Telephone Encounter (Signed)
Called Ms. 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 Patient identified by name and DOB. Ms. Bunyan had testing for this microdeletion offered given that her daughter has a 1q21.1 microdeletion. Discussed with Ms. Ordaz that her testing was positive for the deletion that is in her daughter. Discussed that this has been reported in individuals who are asymptomatic and that the features can range for those with symptoms, even within families. Discussed that this indicates a 50% recurrence risk for the current pregnancy. Ms. Mcnaught inquired if this means her other daughter could also have the microdeletion. Reviewed that there is also a 1 in 2 (50%) chance for her other daughter to have inherited the deletion, and that this would be the same chance for each of her children, given autosomal dominant inheritance. Offered option of follow-up genetic counseling to review these results in more detail. Ms. Welge would like to have follow-up genetic counseling. This is scheduled for Thursday, 4/21, at 1:00 pm. Encouraged Ms. Chiasson to call if additional questions or concerns arise.   Santiago Glad Mirta Mally 04/01/2015 11:19 AM

## 2015-04-03 ENCOUNTER — Telehealth: Payer: Self-pay

## 2015-04-03 LAB — CREATININE CLEARANCE, URINE, 24 HOUR
Creatinine Clearance: 255 mL/min — ABNORMAL HIGH (ref 75–115)
Creatinine, 24H Ur: 1322 mg/d (ref 700–1800)
Creatinine, Urine: 146.9 mg/dL
Creatinine: 0.36 mg/dL — ABNORMAL LOW (ref 0.50–1.10)

## 2015-04-03 LAB — PROTEIN, URINE, 24 HOUR
Protein, 24H Urine: 207 mg/d — ABNORMAL HIGH (ref <150)
Protein, Urine: 23 mg/dL (ref 5–24)

## 2015-04-03 NOTE — Telephone Encounter (Signed)
-----   Message from [place], [place] sent at 04/02/2015  9:44 PM EDT ----- Please informed patient that I have ordered outpatient echocardiogram.

## 2015-04-03 NOTE — Telephone Encounter (Signed)
[place] at [phone]. Outpatient echo scheduled at [address] for Monday 04/08/15 at 1500. Confirmed that pregnancy medicaid will cover visit. Called patient and informed her of appointment. Patient requested I call back and leave message so that she will have the information as she is driving. Left message on voicemail informing her of appointment date, time and location and advised she arrive 15 minutes early.

## 2015-04-04 ENCOUNTER — Ambulatory Visit (HOSPITAL_COMMUNITY): Payer: Medicaid Other

## 2015-04-08 ENCOUNTER — Other Ambulatory Visit (HOSPITAL_COMMUNITY): Payer: Self-pay | Admitting: Obstetrics and Gynecology

## 2015-04-08 ENCOUNTER — Ambulatory Visit (HOSPITAL_COMMUNITY): Payer: Medicaid Other | Attending: Cardiology | Admitting: Radiology

## 2015-04-08 DIAGNOSIS — R079 Chest pain, unspecified: Secondary | ICD-10-CM | POA: Diagnosis not present

## 2015-04-08 DIAGNOSIS — R0600 Dyspnea, unspecified: Secondary | ICD-10-CM | POA: Insufficient documentation

## 2015-04-08 DIAGNOSIS — Z8679 Personal history of other diseases of the circulatory system: Secondary | ICD-10-CM | POA: Diagnosis not present

## 2015-04-08 DIAGNOSIS — R0789 Other chest pain: Secondary | ICD-10-CM | POA: Insufficient documentation

## 2015-04-08 NOTE — Progress Notes (Signed)
Echocardiogram performed with [facility].

## 2015-04-09 ENCOUNTER — Telehealth: Payer: Self-pay | Admitting: Radiology

## 2015-04-09 NOTE — Telephone Encounter (Signed)
New message      Pt calling stating that she had an Echo done yesterday and forgot to get a note for work. Pt would like for [place] to fax a note to her job stating that she had an appt here. Fax number is [phone], Attn: Darla. Please call pt if you have any questions.

## 2015-04-24 ENCOUNTER — Encounter: Payer: Self-pay | Admitting: Physician Assistant

## 2015-04-24 ENCOUNTER — Ambulatory Visit (INDEPENDENT_AMBULATORY_CARE_PROVIDER_SITE_OTHER): Payer: Medicaid Other | Admitting: Physician Assistant

## 2015-04-24 VITALS — BP 111/68 | HR 86 | Wt 193.5 lb

## 2015-04-24 DIAGNOSIS — Z3481 Encounter for supervision of other normal pregnancy, first trimester: Secondary | ICD-10-CM

## 2015-04-24 DIAGNOSIS — R519 Headache, unspecified: Secondary | ICD-10-CM

## 2015-04-24 DIAGNOSIS — R51 Headache: Secondary | ICD-10-CM

## 2015-04-24 LAB — POCT URINALYSIS DIP (DEVICE)
Bilirubin Urine: NEGATIVE
GLUCOSE, UA: NEGATIVE mg/dL
Ketones, ur: NEGATIVE mg/dL
Nitrite: NEGATIVE
Protein, ur: 30 mg/dL — AB
SPECIFIC GRAVITY, URINE: 1.025 (ref 1.005–1.030)
Urobilinogen, UA: 2 mg/dL — ABNORMAL HIGH (ref 0.0–1.0)
pH: 7.5 (ref 5.0–8.0)

## 2015-04-24 NOTE — Patient Instructions (Signed)
Second Trimester of Pregnancy The second trimester is from week 13 through week 28, months 4 through 6. The second trimester is often a time when you feel your best. Your body has also adjusted to being pregnant, and you begin to feel better physically. Usually, morning sickness has lessened or quit completely, you may have more energy, and you may have an increase in appetite. The second trimester is also a time when the fetus is growing rapidly. At the end of the sixth month, the fetus is about 9 inches long and weighs about 1 pounds. You will likely begin to feel the baby move (quickening) between 18 and 20 weeks of the pregnancy. BODY CHANGES Your body goes through many changes during pregnancy. The changes vary from woman to woman.   Your weight will continue to increase. You will notice your lower abdomen bulging out.  You may begin to get stretch marks on your hips, abdomen, and breasts.  You may develop headaches that can be relieved by medicines approved by your health care provider.  You may urinate more often because the fetus is pressing on your bladder.  You may develop or continue to have heartburn as a result of your pregnancy.  You may develop constipation because certain hormones are causing the muscles that push waste through your intestines to slow down.  You may develop hemorrhoids or swollen, bulging veins (varicose veins).  You may have back pain because of the weight gain and pregnancy hormones relaxing your joints between the bones in your pelvis and as a result of a shift in weight and the muscles that support your balance.  Your breasts will continue to grow and be tender.  Your gums may bleed and may be sensitive to brushing and flossing.  Dark spots or blotches (chloasma, mask of pregnancy) may develop on your face. This will likely fade after the baby is born.  A dark line from your belly button to the pubic area (linea nigra) may appear. This will likely fade  after the baby is born.  You may have changes in your hair. These can include thickening of your hair, rapid growth, and changes in texture. Some women also have hair loss during or after pregnancy, or hair that feels dry or thin. Your hair will most likely return to normal after your baby is born. WHAT TO EXPECT AT YOUR PRENATAL VISITS During a routine prenatal visit:  You will be weighed to make sure you and the fetus are growing normally.  Your blood pressure will be taken.  Your abdomen will be measured to track your baby's growth.  The fetal heartbeat will be listened to.  Any test results from the previous visit will be discussed. Your health care provider may ask you:  How you are feeling.  If you are feeling the baby move.  If you have had any abnormal symptoms, such as leaking fluid, bleeding, severe headaches, or abdominal cramping.  If you have any questions. Other tests that may be performed during your second trimester include:  Blood tests that check for:  Low iron levels (anemia).  Gestational diabetes (between 24 and 28 weeks).  Rh antibodies.  Urine tests to check for infections, diabetes, or protein in the urine.  An ultrasound to confirm the proper growth and development of the baby.  An amniocentesis to check for possible genetic problems.  Fetal screens for spina bifida and Down syndrome. HOME CARE INSTRUCTIONS   Avoid all smoking, herbs, alcohol, and unprescribed   drugs. These chemicals affect the formation and growth of the baby.  Follow your health care provider's instructions regarding medicine use. There are medicines that are either safe or unsafe to take during pregnancy.  Exercise only as directed by your health care provider. Experiencing uterine cramps is a good sign to stop exercising.  Continue to eat regular, healthy meals.  Wear a good support bra for breast tenderness.  Do not use hot tubs, steam rooms, or saunas.  Wear your  seat belt at all times when driving.  Avoid raw meat, uncooked cheese, cat litter boxes, and soil used by cats. These carry germs that can cause birth defects in the baby.  Take your prenatal vitamins.  Try taking a stool softener (if your health care provider approves) if you develop constipation. Eat more high-fiber foods, such as fresh vegetables or fruit and whole grains. Drink plenty of fluids to keep your urine clear or pale yellow.  Take warm sitz baths to soothe any pain or discomfort caused by hemorrhoids. Use hemorrhoid cream if your health care provider approves.  If you develop varicose veins, wear support hose. Elevate your feet for 15 minutes, 3-4 times a day. Limit salt in your diet.  Avoid heavy lifting, wear low heel shoes, and practice good posture.  Rest with your legs elevated if you have leg cramps or low back pain.  Visit your dentist if you have not gone yet during your pregnancy. Use a soft toothbrush to brush your teeth and be gentle when you floss.  A sexual relationship may be continued unless your health care provider directs you otherwise.  Continue to go to all your prenatal visits as directed by your health care provider. SEEK MEDICAL CARE IF:   You have dizziness.  You have mild pelvic cramps, pelvic pressure, or nagging pain in the abdominal area.  You have persistent nausea, vomiting, or diarrhea.  You have a bad smelling vaginal discharge.  You have pain with urination. SEEK IMMEDIATE MEDICAL CARE IF:   You have a fever.  You are leaking fluid from your vagina.  You have spotting or bleeding from your vagina.  You have severe abdominal cramping or pain.  You have rapid weight gain or loss.  You have shortness of breath with chest pain.  You notice sudden or extreme swelling of your face, hands, ankles, feet, or legs.  You have not felt your baby move in over an hour.  You have severe headaches that do not go away with  medicine.  You have vision changes. Document Released: 11/24/2001 Document Revised: 12/05/2013 Document Reviewed: 01/31/2013 Exit[employer] Patient Information 2015 [employer]. This information is not intended to replace advice given to you by your health care provider. Make sure you discuss any questions you have with your health care provider.  

## 2015-04-24 NOTE — Progress Notes (Signed)
18 weeks, eating well.  Endorses fetal movement.  Denies LOF, VB, dysuria.   The chest tightness and SOB has improved.   Reports increase in HA frequency.  It is bilat temporal, throbbing, severe intensity, lasting 1 minute with severe intensity and then improving,  It makes her very hot and sweaty and she feels like she needs a period of rest to recover.  She notes sleeping more.  It occurs with coughing, certain stretches or positional changes and happens multiple times every day.   Will refer to neuro for further eval.   Has U/S scheduled next week for anatomy Pt advised to increase fluids Urine culture had to be cancelled due to lack of urine after pt discharged. RTC 4 weeks for OBF.

## 2015-04-24 NOTE — Progress Notes (Signed)
Large leukocytes in urine.

## 2015-04-29 ENCOUNTER — Encounter (HOSPITAL_COMMUNITY): Payer: Self-pay | Admitting: *Deleted

## 2015-04-29 ENCOUNTER — Inpatient Hospital Stay (HOSPITAL_COMMUNITY): Payer: Medicaid Other

## 2015-04-29 ENCOUNTER — Inpatient Hospital Stay (HOSPITAL_COMMUNITY)
Admission: AD | Admit: 2015-04-29 | Discharge: 2015-04-29 | Disposition: A | Discharge status: Home / Self Care | Payer: Medicaid Other | Source: Ambulatory Visit | Attending: Family Medicine | Admitting: Family Medicine

## 2015-04-29 DIAGNOSIS — O26899 Other specified pregnancy related conditions, unspecified trimester: Secondary | ICD-10-CM

## 2015-04-29 DIAGNOSIS — R1032 Left lower quadrant pain: Secondary | ICD-10-CM | POA: Insufficient documentation

## 2015-04-29 DIAGNOSIS — Z3A18 18 weeks gestation of pregnancy: Secondary | ICD-10-CM | POA: Insufficient documentation

## 2015-04-29 DIAGNOSIS — R1011 Right upper quadrant pain: Secondary | ICD-10-CM | POA: Diagnosis not present

## 2015-04-29 DIAGNOSIS — O9989 Other specified diseases and conditions complicating pregnancy, childbirth and the puerperium: Secondary | ICD-10-CM | POA: Diagnosis not present

## 2015-04-29 DIAGNOSIS — R1031 Right lower quadrant pain: Secondary | ICD-10-CM | POA: Diagnosis not present

## 2015-04-29 DIAGNOSIS — R109 Unspecified abdominal pain: Secondary | ICD-10-CM

## 2015-04-29 DIAGNOSIS — N390 Urinary tract infection, site not specified: Secondary | ICD-10-CM | POA: Diagnosis not present

## 2015-04-29 DIAGNOSIS — R1012 Left upper quadrant pain: Secondary | ICD-10-CM | POA: Diagnosis not present

## 2015-04-29 DIAGNOSIS — O2342 Unspecified infection of urinary tract in pregnancy, second trimester: Secondary | ICD-10-CM | POA: Diagnosis not present

## 2015-04-29 LAB — COMPREHENSIVE METABOLIC PANEL
ALBUMIN: 2.6 g/dL — AB (ref 3.5–5.0)
ALT: 10 U/L — ABNORMAL LOW (ref 14–54)
AST: 11 U/L — AB (ref 15–41)
Alkaline Phosphatase: 84 U/L (ref 38–126)
Anion gap: 9 (ref 5–15)
BUN: 5 mg/dL — ABNORMAL LOW (ref 6–20)
CALCIUM: 7.9 mg/dL — AB (ref 8.9–10.3)
CO2: 23 mmol/L (ref 22–32)
Chloride: 103 mmol/L (ref 101–111)
Creatinine, Ser: 0.46 mg/dL (ref 0.44–1.00)
GFR calc Af Amer: >60 mL/min (ref >60)
GFR calc non Af Amer: >60 mL/min (ref >60)
Glucose, Bld: 88 mg/dL (ref 65–99)
Potassium: 4.1 mmol/L (ref 3.5–5.1)
Sodium: 135 mmol/L (ref 135–145)
Total Bilirubin: 0.5 mg/dL (ref 0.3–1.2)
Total Protein: 6.4 g/dL — ABNORMAL LOW (ref 6.5–8.1)

## 2015-04-29 LAB — WET PREP, GENITAL
TRICH WET PREP: NONE SEEN
Yeast Wet Prep HPF POC: NONE SEEN

## 2015-04-29 LAB — URINE MICROSCOPIC-ADD ON

## 2015-04-29 LAB — URINALYSIS, ROUTINE W REFLEX MICROSCOPIC
Glucose, UA: NEGATIVE mg/dL
Hgb urine dipstick: NEGATIVE
Ketones, ur: 15 mg/dL — AB
Nitrite: NEGATIVE
Protein, ur: NEGATIVE mg/dL
SPECIFIC GRAVITY, URINE: 1.025 (ref 1.005–1.030)
Urobilinogen, UA: 1 mg/dL (ref 0.0–1.0)
pH: 5.5 (ref 5.0–8.0)

## 2015-04-29 LAB — CBC
HCT: 33.2 % — ABNORMAL LOW (ref 36.0–46.0)
Hemoglobin: 11.1 g/dL — ABNORMAL LOW (ref 12.0–15.0)
MCH: 28.8 pg (ref 26.0–34.0)
MCHC: 33.4 g/dL (ref 30.0–36.0)
MCV: 86 fL (ref 78.0–100.0)
PLATELETS: 202 10*3/uL (ref 150–400)
RBC: 3.86 MIL/uL — AB (ref 3.87–5.11)
RDW: 14.7 % (ref 11.5–15.5)
WBC: 9.1 10*3/uL (ref 4.0–10.5)

## 2015-04-29 MED ORDER — HYDROCODONE-ACETAMINOPHEN 5-325 MG PO TABS
1.0000 | ORAL_TABLET | Freq: Once | ORAL | Status: AC
  Administered 2015-04-29: 1 via ORAL
  Filled 2015-04-29: qty 1

## 2015-04-29 MED ORDER — NITROFURANTOIN MONOHYD MACRO 100 MG PO CAPS: 100.0000 mg | ORAL_CAPSULE | Freq: Two times a day (BID) | ORAL | Status: DC

## 2015-04-29 NOTE — MAU Note (Signed)
Urine in lab 

## 2015-04-29 NOTE — MAU Provider Note (Signed)
History     CSN: [id number]  Arrival date and time: 04/29/15 1829   First Provider Initiated Contact with Patient 04/29/15 [phone]      Chief Complaint  Patient presents with  . Abdominal Pain   HPI Comments: Ms. Brenda Spencer is a 24 y.o. Female. [id number] at [redacted]w[redacted]d who presents with abdominal pain.   Abdominal Pain This is a new problem. The current episode started yesterday. The onset quality is sudden. The problem occurs intermittently. The problem has been gradually worsening. The pain is located in the LLQ, RLQ, RUQ and LUQ. The pain is at a severity of 10/10. The quality of the pain is cramping. Pertinent negatives include no anorexia, constipation, diarrhea, dysuria, fever, frequency, hematuria, nausea or vomiting. The pain is aggravated by movement. The pain is relieved by nothing. She has tried acetaminophen and antacids for the symptoms. The treatment provided no relief.    OB History    Gravida Para Term Preterm AB TAB SAB Ectopic Multiple Living   '5 3 2 1 1  1   3      '$ Obstetric Comments   2011 PIH;IOL; PP CHF-HOSPITALIZED X 1 WEEK      Past Medical History  Diagnosis Date  . Eczema   . CHF (congestive heart failure)     RESOLVED; HAD CARDIAC EVAL   . Congestive heart failure 2011    related to preeclampsia  . Hypothyroidism AGE 31     NO TX  . Pregnancy induced hypertension 2011  . Preterm labor 2011  . Infection     UTI OCC  . Infection 2013    [place]  . Headache(784.0) 2011    MIGRAQINES; OTC  . Asthma 2009; [facility]    INHALER PRN  . Vaginal Pap smear, abnormal     colpo  . Hyperthyroidism     Past Surgical History  Procedure Laterality Date  . Tonsillectomy    . Wisdom tooth extraction    . Tear duct probing    . Cholecystectomy      Family History  Problem Relation Age of Onset  . Anesthesia problems Neg Hx   . Other Neg Hx   . Asthma Brother   . Learning disabilities Brother   . Asthma Daughter   . Learning disabilities Daughter    . Seizures Daughter   . Learning disabilities Other   . Colon cancer Maternal Grandmother   . Cancer Maternal Grandmother     colon  . Chromosomal disorder Daughter     1q21.1 microdeletion  . Cataracts Daughter     congenital    History  Substance Use Topics  . Smoking status: Never Smoker   . Smokeless tobacco: Never Used  . Alcohol Use: No    Allergies: No Known Allergies  Prescriptions prior to admission  Medication Sig Dispense Refill Last Dose  . acetaminophen (TYLENOL) 325 MG tablet Take 650 mg by mouth every 6 (six) hours as needed.   04/28/2015 at Unknown time  . albuterol (PROVENTIL HFA;VENTOLIN HFA) 108 (90 BASE) MCG/ACT inhaler Inhale 2 puffs into the lungs every 6 (six) hours as needed for wheezing or shortness of breath.     . Prenatal Vit-Fe Fumarate-FA (PRENATAL VITAMIN PO) Take by mouth.   04/28/2015 at Unknown time   Results for orders placed or performed during the hospital encounter of 04/29/15 (from the past 48 hour(s))  Urinalysis, Routine w reflex microscopic     Status: Abnormal   Collection Time: 04/29/15  7:30 AM  Result Value Ref Range   Color, Urine Zahlia (A) YELLOW    Comment: BIOCHEMICALS MAY BE AFFECTED BY COLOR   APPearance CLEAR CLEAR   Specific Gravity, Urine 1.025 1.005 - 1.030   pH 5.5 5.0 - 8.0   Glucose, UA NEGATIVE NEGATIVE mg/dL   Hgb urine dipstick NEGATIVE NEGATIVE   Bilirubin Urine SMALL (A) NEGATIVE   Ketones, ur 15 (A) NEGATIVE mg/dL   Protein, ur NEGATIVE NEGATIVE mg/dL   Urobilinogen, UA 1.0 0.0 - 1.0 mg/dL   Nitrite NEGATIVE NEGATIVE   Leukocytes, UA SMALL (A) NEGATIVE  Urine microscopic-add on     Status: Abnormal   Collection Time: 04/29/15  7:30 AM  Result Value Ref Range   Squamous Epithelial / LPF FEW (A) RARE   WBC, UA 3-6 <3 WBC/hpf   RBC / HPF 0-2 <3 RBC/hpf   Bacteria, UA FEW (A) RARE   Urine-Other MUCOUS PRESENT   CBC     Status: Abnormal   Collection Time: 04/29/15  8:35 AM  Result Value Ref Range   WBC  9.1 4.0 - 10.5 K/uL   RBC 3.86 (L) 3.87 - 5.11 MIL/uL   Hemoglobin 11.1 (L) 12.0 - 15.0 g/dL   HCT 33.2 (L) 36.0 - 46.0 %   MCV 86.0 78.0 - 100.0 fL   MCH 28.8 26.0 - 34.0 pg   MCHC 33.4 30.0 - 36.0 g/dL   RDW 14.7 11.5 - 15.5 %   Platelets 202 150 - 400 K/uL  Comprehensive metabolic panel     Status: Abnormal   Collection Time: 04/29/15  8:35 AM  Result Value Ref Range   Sodium 135 135 - 145 mmol/L   Potassium 4.1 3.5 - 5.1 mmol/L   Chloride 103 101 - 111 mmol/L   CO2 23 22 - 32 mmol/L   Glucose, Bld 88 65 - 99 mg/dL   BUN 5 (L) 6 - 20 mg/dL   Creatinine, Ser 0.46 0.44 - 1.00 mg/dL   Calcium 7.9 (L) 8.9 - 10.3 mg/dL   Total Protein 6.4 (L) 6.5 - 8.1 g/dL   Albumin 2.6 (L) 3.5 - 5.0 g/dL   AST 11 (L) 15 - 41 U/L   ALT 10 (L) 14 - 54 U/L   Alkaline Phosphatase 84 38 - 126 U/L   Total Bilirubin 0.5 0.3 - 1.2 mg/dL   GFR calc non Af Amer >60 >60 mL/min   GFR calc Af Amer >60 >60 mL/min    Comment: (NOTE) The eGFR has been calculated using the CKD EPI equation. This calculation has not been validated in all clinical situations. eGFR's persistently <60 mL/min signify possible Chronic Kidney Disease.    Anion gap 9 5 - 15  Wet prep, genital     Status: Abnormal   Collection Time: 04/29/15  9:05 AM  Result Value Ref Range   Yeast Wet Prep HPF POC NONE SEEN NONE SEEN   Trich, Wet Prep NONE SEEN NONE SEEN   Clue Cells Wet Prep HPF POC FEW (A) NONE SEEN   WBC, Wet Prep HPF POC MANY (A) NONE SEEN    Comment: MODERATE BACTERIA SEEN           Review of Systems  Constitutional: Negative for fever and chills.  Gastrointestinal: Positive for abdominal pain. Negative for nausea, vomiting, diarrhea, constipation and anorexia.  Genitourinary: Negative for dysuria, urgency, frequency, hematuria and flank pain.   Physical Exam   Blood pressure 121/68, temperature 98 F (36.7 C), resp.  rate 18, last menstrual period 12/18/2014, unknown if currently breastfeeding.  Physical  Exam  Constitutional: She is oriented to person, place, and time. She appears well-developed and well-nourished. No distress.  HENT:  Head: Normocephalic.  Eyes: Pupils are equal, round, and reactive to light.  Neck: Neck supple.  Respiratory: Effort normal.  GI: Soft. Normal appearance. There is generalized tenderness. There is guarding. There is no rigidity, no rebound and no CVA tenderness.  Genitourinary:  Speculum exam: Vagina - Small amount of creamy discharge, no odor Cervix - No contact bleeding Bimanual exam: Cervix closed Uterus non tender, enlarged  Adnexa non tender, no masses bilaterally, + suprapubic tenderness  GC/Chlam, wet prep done Chaperone present for exam.  Neurological: She is alert and oriented to person, place, and time.  Skin: Skin is warm. She is not diaphoretic.  Psychiatric: Her behavior is normal.    MAU Course  Procedures  None  MDM  Limited US shows normal cervical length Vicodin 1 tab given in MAU; significant pain relief Urine culture pending  + fetal heart tones by doppler  Assessment and Plan   A:  1. UTI (lower urinary tract infection)   2. Abdominal pain during pregnancy     P:  Discharge home in stable condition Pregnancy support belt recommended RX: Macrobid Urine culture pending Return to MAU if symptoms worsen Ok to take tylenol over the counter as directed on the bottle Preterm labor precautions    Lezlie Lye, NP 04/29/2015 11:00 AM

## 2015-04-29 NOTE — MAU Note (Signed)
Pt rep[orts she started having a constant abd pain since yesterday.

## 2015-04-29 NOTE — Discharge Instructions (Signed)

## 2015-04-30 LAB — CULTURE, OB URINE
Colony Count: NO GROWTH
Culture: NO GROWTH
Special Requests: NORMAL

## 2015-04-30 LAB — GC/CHLAMYDIA PROBE AMP (~~LOC~~) NOT AT ARMC
Chlamydia: NEGATIVE
NEISSERIA GONORRHEA: NEGATIVE

## 2015-05-03 ENCOUNTER — Ambulatory Visit (HOSPITAL_COMMUNITY)
Admission: RE | Admit: 2015-05-03 | Discharge: 2015-05-03 | Disposition: A | Discharge status: Home / Self Care | Payer: Medicaid Other | Source: Ambulatory Visit | Attending: Physician Assistant | Admitting: Physician Assistant

## 2015-05-03 ENCOUNTER — Ambulatory Visit (HOSPITAL_COMMUNITY)
Admission: RE | Admit: 2015-05-03 | Discharge: 2015-05-03 | Disposition: A | Discharge status: Home / Self Care | Payer: Medicaid Other | Source: Ambulatory Visit | Attending: Advanced Practice Midwife | Admitting: Advanced Practice Midwife

## 2015-05-03 ENCOUNTER — Encounter (HOSPITAL_COMMUNITY): Payer: Self-pay

## 2015-05-03 ENCOUNTER — Telehealth: Payer: Self-pay

## 2015-05-03 DIAGNOSIS — Q9389 Other deletions from the autosomes: Secondary | ICD-10-CM | POA: Insufficient documentation

## 2015-05-03 DIAGNOSIS — Z8279 Family history of other congenital malformations, deformations and chromosomal abnormalities: Secondary | ICD-10-CM

## 2015-05-03 DIAGNOSIS — Z3689 Encounter for other specified antenatal screening: Secondary | ICD-10-CM | POA: Insufficient documentation

## 2015-05-03 DIAGNOSIS — Z8489 Family history of other specified conditions: Secondary | ICD-10-CM | POA: Diagnosis not present

## 2015-05-03 DIAGNOSIS — Z3A19 19 weeks gestation of pregnancy: Secondary | ICD-10-CM | POA: Insufficient documentation

## 2015-05-03 DIAGNOSIS — O352XX0 Maternal care for (suspected) hereditary disease in fetus, not applicable or unspecified: Secondary | ICD-10-CM | POA: Insufficient documentation

## 2015-05-03 DIAGNOSIS — O99212 Obesity complicating pregnancy, second trimester: Secondary | ICD-10-CM | POA: Insufficient documentation

## 2015-05-03 NOTE — Progress Notes (Signed)
Genetic Counseling  High-Risk Gestation Note  Appointment Date:  05/03/2015 Referred By: Paticia Stack, * Date of Birth:  December 22, 1990   Pregnancy History: [id number] Estimated Date of Delivery: 09/24/15 Estimated Gestational Age: [redacted]w[redacted]d Attending: Renella Cunas, MD    I met with 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   [name] was previously seen for genetic counseling on 03/15/15, given that her two year old daughter has 1q21.1 microdeletion. [name] elected to have parental blood analysis for herself at that time to assess for 1q21.1 microdeletion to further refine recurrence risk estimate for the current pregnancy. 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 We previously discussed this information via telephone.   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 We also reviewed the variable expressivity reported with this microdeletion and that reduced penetrance is described. Reduced penetrance means that an individual may inherit the 1q21.1 microdeletion but display no associated features of the condition. [name] reported that her older daughter and her son are currently scheduled to meet with medical genetics in the fall of this year to pursue genetic testing. We also discussed the option of [name] herself having a medical genetics evaluation to assess for possible features of 1q21.1 microdeletion following her testing result.   We reviewed the option of prenatal diagnosis via amniocentesis in the current pregnancy. [name] declined amniocentesis given the associated risk for complications. We discussed that postnatal assessment for features of 1q21.1 microdeletion syndrome would be available for the current pregnancy. We  reviewed that in the case that testing identified the presence of 1q21.1 microdeletion, the specific features, if any, that would be present in that individual could not be predicted. [name] had no additional questions at this time.   I counseled [name] regarding the above risks and available options.  The approximate face-to-face time with the genetic counselor was 20 minutes.  Chipper Oman, MS Certified Genetic Counselor 05/03/2015

## 2015-05-06 ENCOUNTER — Ambulatory Visit: Payer: Medicaid Other | Admitting: Neurology

## 2015-05-10 NOTE — Telephone Encounter (Signed)
Note opened accidentally

## 2015-05-14 ENCOUNTER — Encounter: Payer: Self-pay | Admitting: Neurology

## 2015-05-14 ENCOUNTER — Ambulatory Visit (INDEPENDENT_AMBULATORY_CARE_PROVIDER_SITE_OTHER): Payer: Medicaid Other | Admitting: Neurology

## 2015-05-14 VITALS — BP 112/74 | HR 102 | Resp 16 | Ht 60.0 in | Wt 196.0 lb

## 2015-05-14 DIAGNOSIS — Z3492 Encounter for supervision of normal pregnancy, unspecified, second trimester: Secondary | ICD-10-CM

## 2015-05-14 DIAGNOSIS — G43109 Migraine with aura, not intractable, without status migrainosus: Secondary | ICD-10-CM

## 2015-05-14 NOTE — Patient Instructions (Addendum)
Please remember, common headache triggers are: hormonal changes, including pregnancy, sleep deprivation, dehydration, overheating, stress, hypoglycemia or skipping meals and blood sugar fluctuations, excessive pain medications or excessive alcohol use or caffeine withdrawal. Some people have food triggers such as aged cheese, orange juice or chocolate, especially dark chocolate, or MSG (monosodium glutamate). Try to avoid these headache triggers as much possible. It may be helpful to keep a headache diary to figure out what makes your headaches worse or brings them on and what alleviates them. Some people report headache onset after exercise but studies have shown that regular exercise may actually prevent headaches from coming. If you have exercise-induced headaches, please make sure that you drink plenty of fluid before and after exercising and that you do not over do it and do not overheat.  If you have sudden onset of one sided weakness, numbness, tingling, slurring of speech or droopy face, hearing loss, significant ringing in the ears, double vision or visual field or loss of vision, or and unusually severe headache, you will have to have someone take you to the ER or call 911.   Decrease your caffeine intake and increase your water intake. Limit yourself to at the most one caffeine drink per day.

## 2015-05-14 NOTE — Progress Notes (Signed)
Subjective:    Patient ID: Brenda Spencer is a 24 y.o. female.  HPI     Interim history:   Brenda Spencer is a 24 year old right-handed woman with an underlying medical history of asthma, allergies, preeclampsia, currently 20 (and 6 days) weeks pregnant, who presents for follow-up consultation of her migraine headaches. The patient is unaccompanied today. I first met her on 01/09/2014 at the request of her OB/GYN, at which time she was pregnant [redacted] weeks. She was advised to continue as needed medications for her headaches as prescribed by her OB. She was advised to follow-up after delivery of her baby so we could talk about preventative medications of her migraines.  Today, 05/14/2015: she reports 4-5 migraines in the last 3 weeks. Usually, bi-temporal, associated with blurry vision and preceded by visual aura, associated also with nausea and vomiting, no recent tingling or numbness and followed by excess sweating. She has her blood sugar test at week 27 pending. She has been using tylenol. She is wondering if these of migraines of a different kind. She admits that she was drinking a lot of caffeine during the beginning of her pregnancy. She has been advised by her OB to reduce her caffeine intake. She was drinking 3-4 cans of Dr. Malachi Bonds a day. She is trying to drink less caffeine and more water. She works at [employer].  Previously:   Has had recurrent migraines since teenage years, age 82. She presented to the emergency room on 12/16/2013 with a migraine associated with arm tingling and arm numbness on the left as well as some blurry vision. I reviewed those records from the emergency room visit. She was diagnosed with the UTI as well as complicated migraine. She was treated with IV Benadryl, Reglan, and dexamethasone. She was also treated for her UTI. Outpatient treatment included magnesium, 200-400 mg daily, Phenergan as needed and ibuprofen 600 mg up to 3 times a day as needed. She feels  that during this pregnancy her migraines have become worse. She has nearly daily headaches. She has been using ibuprofen. She has not been using magnesium. She describes a throbbing headache, primarily in the bifrontal and bitemporal area, associated with photophobia and blurry vision. She has not had any more arm tingling. She's never had any weakness or slurring of speech or droopy face. She has a family history of migraines in her mother. During her other pregnancies she did not have exacerbation of her migraines. She has 2 young daughters. She is expecting a boy. Currently she feels well. She has no blurry vision. She has some associated vomiting at times but typically no nausea.    Her Past Medical History Is Significant For: Past Medical History  Diagnosis Date  . Eczema   . CHF (congestive heart failure)     RESOLVED; HAD CARDIAC EVAL   . Congestive heart failure 2011    related to preeclampsia  . Hypothyroidism AGE 64     NO TX  . Pregnancy induced hypertension 2011  . Preterm labor 2011  . Infection     UTI OCC  . Infection 2013    [place]  . Headache(784.0) 2011    MIGRAQINES; OTC  . Asthma 2009; CORNERSTONE    INHALER PRN  . Vaginal Pap smear, abnormal     colpo  . Hyperthyroidism     Her Past Surgical History Is Significant For: Past Surgical History  Procedure Laterality Date  . Tonsillectomy    . Wisdom tooth extraction    .  Tear duct probing    . Cholecystectomy      Her Family History Is Significant For: Family History  Problem Relation Age of Onset  . Anesthesia problems Neg Hx   . Other Neg Hx   . Asthma Brother   . Learning disabilities Brother   . Asthma Daughter   . Learning disabilities Daughter   . Seizures Daughter   . Learning disabilities Other   . Colon cancer Maternal Grandmother   . Cancer Maternal Grandmother     colon  . Chromosomal disorder Daughter     1q21.1 microdeletion  . Cataracts Daughter     congenital    Her Social History  Is Significant For: History   Social History  . Marital Status: Married    Spouse Name: CHRISTOPHER  . Number of Children: 1  . Years of Education: 12   Occupational History  . HOMEMAKER    Social History Main Topics  . Smoking status: Never Smoker   . Smokeless tobacco: Never Used  . Alcohol Use: No  . Drug Use: No  . Sexual Activity:    Partners: Male    Birth Control/ Protection: None   Other Topics Concern  . None   Social History Narrative   3-4 cups of tea and soda a day     Her Allergies Are:  No Known Allergies:   Her Current Medications Are:  Outpatient Encounter Prescriptions as of 05/14/2015  Medication Sig  . acetaminophen (TYLENOL) 325 MG tablet Take 650 mg by mouth every 6 (six) hours as needed.  Marland Kitchen albuterol (PROVENTIL HFA;VENTOLIN HFA) 108 (90 BASE) MCG/ACT inhaler Inhale 2 puffs into the lungs every 6 (six) hours as needed for wheezing or shortness of breath.  . nitrofurantoin, macrocrystal-monohydrate, (MACROBID) 100 MG capsule Take 1 capsule (100 mg total) by mouth 2 (two) times daily.  . Prenatal Vit-Fe Fumarate-FA (PRENATAL VITAMIN PO) Take by mouth.   No facility-administered encounter medications on file as of 05/14/2015.  :  Review of Systems:  Out of a complete 14 point review of systems, all are reviewed and negative with the exception of these symptoms as listed below:   Review of Systems  Constitutional:       Diaphoresis   Eyes:       Blurred vision   Gastrointestinal: Positive for nausea and vomiting.  Neurological: Positive for headaches.       Sudden onset of headaches     Objective:  Neurologic Exam  Physical Exam Physical Examination:   Filed Vitals:   05/14/15 0919  BP: 112/74  Pulse: 102  Resp: 16   General Examination: The patient is a very pleasant 24 y.o. female in no acute distress. She appears well-developed and well-nourished and adequately groomed.   HEENT: Normocephalic, atraumatic, pupils are equal, round  and reactive to light and accommodation. Funduscopic exam is normal with sharp disc margins noted. Extraocular tracking is good without limitation to gaze excursion or nystagmus noted. Normal smooth pursuit is noted. Hearing is grossly intact. Visual fields are full by finger perimetry again today.  Face is symmetric with normal facial animation and normal facial sensation. Speech is clear with no dysarthria noted. There is no hypophonia. There is no lip, neck/head, jaw or voice tremor. Neck is supple with full range of passive and active motion. There are no carotid bruits on auscultation. Oropharynx exam reveals: mild mouth dryness, adequate dental hygiene and mild airway crowding, due to narrow airway entry, she has a tongue  piercing. Mallampati is class II. Tongue protrudes centrally and palate elevates symmetrically. Tonsils are absent.   Chest: Clear to auscultation without wheezing, rhonchi or crackles noted.  Heart: S1+S2+0, regular and normal without murmurs, rubs or gallops noted.   Abdomen: Soft, non-tender and non-distended (other than pregnant belly) with normal bowel sounds appreciated on auscultation.  Extremities: There is no pitting edema in the distal lower extremities bilaterally. Pedal pulses are intact.  Skin: Warm and dry without trophic changes noted. There are no varicose veins.  Musculoskeletal: exam reveals no obvious joint deformities, tenderness or joint swelling or erythema.   Neurologically:  Mental status: The patient is awake, alert and oriented in all 4 spheres. Her memory, attention, language and knowledge are appropriate. There is no aphasia, agnosia, apraxia or anomia. Speech is clear with normal prosody and enunciation. Thought process is linear. Mood is congruent and affect is normal.  Cranial nerves are as described above under HEENT exam. In addition, shoulder shrug is normal with equal shoulder height noted. Motor exam: Normal bulk, strength and tone is  noted. There is no drift, tremor or rebound. Romberg is negative. Reflexes are 2+ throughout. Fine motor skills are intact with normal finger taps, normal hand movements, normal rapid alternating patting, normal foot taps and normal foot agility.  Cerebellar testing shows no dysmetria or intention tremor on finger to nose testing. Heel to shin is unremarkable bilaterally. There is no truncal or gait ataxia.  Sensory exam is intact to light touch, pinprick, vibration, temperature sense in the upper and lower extremities.  Gait, station and balance are unremarkable. No veering to one side is noted. No leaning to one side is noted. Posture is age-appropriate and stance is narrow based. No problems turning are noted. She turns en bloc. Tandem walk is unremarkable.   Assessment and Plan:   In summary, Brenda Spencer is a very pleasant 24 year old female with an underlying medical history of asthma, allergies, preeclampsia, currently 20+ weeks pregnant, who presents for follow-up consultation of her migraines. She has had migraines since age 56. During her last pregnancy she had worsening migraines. During this pregnancy she has more nausea and vomiting. Thankfully her physical exam and neurological exam are nonfocal. She is reassured in that regard. She is advised to use the medications as prescribed by her OB during her pregnancy and lactation. She is advised to reduce her caffeine intake and at the most limit herself to just one caffeine beverage per day. She is also advised that taking daily over-the-counter pain medication such as Tylenol which is usually deemed safe during pregnancy, and cause medication overuse headache is or rebound headaches of stopped suddenly. Similarly, reducing or stopping caffeine suddenly can cause withdrawal headaches. We talked about headache triggers as well. She is advised to make sure she drinks plenty of fluid, gets enough rest, and reduces stressors as much is possible.  She is advised that I would be happy to see her back once she has had her baby and once she is done breast-feeding if she decides to proceed. I answered all her questions today and the patient was in agreement. I spent 25 minutes in total face-to-face time with the patient, more than 50% of which was spent in counseling and coordination of care, reviewing test results, reviewing medication and discussing or reviewing the diagnosis of migraines, the prognosis and treatment options.

## 2015-05-22 ENCOUNTER — Ambulatory Visit (INDEPENDENT_AMBULATORY_CARE_PROVIDER_SITE_OTHER): Payer: Medicaid Other | Admitting: Family

## 2015-05-22 ENCOUNTER — Encounter: Payer: Self-pay | Admitting: Obstetrics and Gynecology

## 2015-05-22 VITALS — BP 119/62 | HR 97 | Temp 97.7°F | Wt 195.1 lb

## 2015-05-22 DIAGNOSIS — Z3481 Encounter for supervision of other normal pregnancy, first trimester: Secondary | ICD-10-CM

## 2015-05-22 DIAGNOSIS — O2342 Unspecified infection of urinary tract in pregnancy, second trimester: Secondary | ICD-10-CM

## 2015-05-22 DIAGNOSIS — R8271 Bacteriuria: Secondary | ICD-10-CM

## 2015-05-22 DIAGNOSIS — O9989 Other specified diseases and conditions complicating pregnancy, childbirth and the puerperium: Principal | ICD-10-CM

## 2015-05-22 LAB — POCT URINALYSIS DIP (DEVICE)
BILIRUBIN URINE: NEGATIVE
Glucose, UA: NEGATIVE mg/dL
KETONES UR: NEGATIVE mg/dL
Nitrite: NEGATIVE
PROTEIN: 30 mg/dL — AB
SPECIFIC GRAVITY, URINE: 1.02 (ref 1.005–1.030)
Urobilinogen, UA: 1 mg/dL (ref 0.0–1.0)
pH: 7 (ref 5.0–8.0)

## 2015-05-22 MED ORDER — NITROFURANTOIN MONOHYD MACRO 100 MG PO CAPS: 100.0000 mg | ORAL_CAPSULE | Freq: Two times a day (BID) | ORAL | Status: DC

## 2015-05-22 NOTE — Progress Notes (Signed)
Doing well; desires BTL, will sign consent today.  Large leuks in urine.  No UTI symptoms.  RX Macrobid.  Urine culture sent to lab.

## 2015-05-22 NOTE — Progress Notes (Signed)
Large leuks and small hgb noted in urine.

## 2015-05-22 NOTE — Progress Notes (Signed)
BTL papers signed. Correct name used verified with front office staff. Copy given to patient and original placed in scan folder.

## 2015-05-25 LAB — CULTURE, OB URINE: Colony Count: >=100000

## 2015-05-28 ENCOUNTER — Encounter: Payer: Self-pay | Admitting: General Practice

## 2015-06-12 ENCOUNTER — Encounter: Payer: Self-pay | Admitting: Family

## 2015-06-12 ENCOUNTER — Ambulatory Visit (INDEPENDENT_AMBULATORY_CARE_PROVIDER_SITE_OTHER): Payer: Medicaid Other | Admitting: Family

## 2015-06-12 ENCOUNTER — Encounter: Payer: Self-pay | Admitting: *Deleted

## 2015-06-12 VITALS — BP 115/64 | HR 102 | Temp 98.1°F | Wt 195.3 lb

## 2015-06-12 DIAGNOSIS — Z3481 Encounter for supervision of other normal pregnancy, first trimester: Secondary | ICD-10-CM

## 2015-06-12 DIAGNOSIS — N390 Urinary tract infection, site not specified: Secondary | ICD-10-CM

## 2015-06-12 DIAGNOSIS — R8271 Bacteriuria: Secondary | ICD-10-CM

## 2015-06-12 LAB — POCT URINALYSIS DIP (DEVICE)
BILIRUBIN URINE: NEGATIVE
Glucose, UA: NEGATIVE mg/dL
Ketones, ur: NEGATIVE mg/dL
NITRITE: NEGATIVE
PH: 8 (ref 5.0–8.0)
Protein, ur: 30 mg/dL — AB
Specific Gravity, Urine: 1.02 (ref 1.005–1.030)
Urobilinogen, UA: 2 mg/dL — ABNORMAL HIGH (ref 0.0–1.0)

## 2015-06-12 NOTE — Progress Notes (Signed)
Large leuks on UA, patient still reporting back pain. Finished macrobid on Tuesday  Patient plans on bottle feeding, will not breastfeed

## 2015-06-12 NOTE — Progress Notes (Signed)
Subjective:   Brenda Spencer  is a 24 y.o.  female being seen today for her obstetrical visit. She is at  [redacted]w[redacted]d . Patient reports no complaints. Fetal movement: normal.  Menstrual History: OB History    Gravida Para Term Preterm AB TAB SAB Ectopic Multiple Living   1               The following portions of the patient's history were reviewed and updated as appropriate: allergies, current medications, past family history, past medical history, past social history, past surgical history and problem list.  Review of Systems Constitutional: negative Genitourinary:negative; pt denies vaginal bleeding or leaking of fluid.  Reports no contractions.     Objective:   Filed Vitals:   06/12/15 0835  BP: 115/64  Pulse: 102  Temp: 98.1 F (36.7 C)    FHT:  138 BPM  Uterine Size: 27 cm        Assessment:   [id number]  at [redacted]w[redacted]d wks   Plan:   Urine culture sent again - wait for results prior to treatment Social worker today. Follow up in 2 Weeks.

## 2015-06-13 LAB — CULTURE, OB URINE
Colony Count: NO GROWTH
ORGANISM ID, BACTERIA: NO GROWTH

## 2015-06-26 ENCOUNTER — Encounter: Payer: Medicaid Other | Admitting: Certified Nurse Midwife

## 2015-07-02 ENCOUNTER — Encounter: Payer: Self-pay | Admitting: Obstetrics & Gynecology

## 2015-07-02 ENCOUNTER — Ambulatory Visit (INDEPENDENT_AMBULATORY_CARE_PROVIDER_SITE_OTHER): Payer: Medicaid Other | Admitting: Obstetrics & Gynecology

## 2015-07-02 VITALS — BP 122/78 | HR 102 | Temp 98.6°F | Wt 198.3 lb

## 2015-07-02 DIAGNOSIS — Z8489 Family history of other specified conditions: Secondary | ICD-10-CM

## 2015-07-02 DIAGNOSIS — Z3481 Encounter for supervision of other normal pregnancy, first trimester: Secondary | ICD-10-CM

## 2015-07-02 DIAGNOSIS — Z8279 Family history of other congenital malformations, deformations and chromosomal abnormalities: Secondary | ICD-10-CM

## 2015-07-02 DIAGNOSIS — O98819 Other maternal infectious and parasitic diseases complicating pregnancy, unspecified trimester: Secondary | ICD-10-CM

## 2015-07-02 DIAGNOSIS — O352XX1 Maternal care for (suspected) hereditary disease in fetus, fetus 1: Secondary | ICD-10-CM

## 2015-07-02 DIAGNOSIS — O26843 Uterine size-date discrepancy, third trimester: Secondary | ICD-10-CM

## 2015-07-02 DIAGNOSIS — B951 Streptococcus, group B, as the cause of diseases classified elsewhere: Secondary | ICD-10-CM | POA: Insufficient documentation

## 2015-07-02 DIAGNOSIS — O09293 Supervision of pregnancy with other poor reproductive or obstetric history, third trimester: Secondary | ICD-10-CM

## 2015-07-02 DIAGNOSIS — O99820 Streptococcus B carrier state complicating pregnancy: Secondary | ICD-10-CM

## 2015-07-02 DIAGNOSIS — M62830 Muscle spasm of back: Secondary | ICD-10-CM

## 2015-07-02 LAB — POCT URINALYSIS DIP (DEVICE)
Glucose, UA: NEGATIVE mg/dL
Hgb urine dipstick: NEGATIVE
Ketones, ur: NEGATIVE mg/dL
Nitrite: NEGATIVE
Protein, ur: NEGATIVE mg/dL
Specific Gravity, Urine: 1.025 (ref 1.005–1.030)
Urobilinogen, UA: 2 mg/dL — ABNORMAL HIGH (ref 0.0–1.0)
pH: 6.5 (ref 5.0–8.0)

## 2015-07-02 MED ORDER — ASPIRIN 81 MG PO CHEW: 81.0000 mg | CHEWABLE_TABLET | Freq: Every day | ORAL | Status: DC

## 2015-07-02 MED ORDER — CYCLOBENZAPRINE HCL 5 MG PO TABS: 5.0000 mg | ORAL_TABLET | Freq: Three times a day (TID) | ORAL | Status: DC | PRN

## 2015-07-02 NOTE — Progress Notes (Signed)
OB f/u US scheduled for 07/10/15 @ 1515

## 2015-07-02 NOTE — Progress Notes (Signed)
C/o feeling like she is getting sick- neck/throat and ear pain. Also c/o back spasms.

## 2015-07-02 NOTE — Patient Instructions (Signed)
Group B Streptococcus Infection During Pregnancy Group B streptococcus (GBS) is a type of bacteria often found in healthy women. GBS is not the same as the bacteria that causes strep throat. You may have GBS in your vagina, rectum, or bladder. GBS does not spread through sexual contact, but it can be passed to a baby during childbirth. This can be dangerous for your baby. It is not dangerous to you and usually does not cause any symptoms. Your health care provider may test you for GBS when your pregnancy is between 35 and 37 weeks. GBS is dangerous only during birth, so there is no need to test for it earlier. It is possible to have GBS during pregnancy and never pass it to your baby. If your test results are positive for GBS, your health care provider may recommend giving you antibiotic medicine during delivery to make sure your baby stays healthy. RISK FACTORS You are more likely to pass GBS to your baby if:   Your water breaks (ruptured membrane) or you go into labor before 37 weeks.  Your water breaks 18 hours before you deliver.  You passed GBS during a previous pregnancy.  You have a urinary tract infection caused by GBS any time during pregnancy.  You have a fever during labor. SYMPTOMS Most women who have GBS do not have any symptoms. If you have a urinary tract infection caused by GBS, you might have frequent or painful urination and fever. Babies who get GBS usually show symptoms within 7 days of birth. Symptoms may include:   Breathing problems.  Heart and blood pressure problems.  Digestive and kidney problems. DIAGNOSIS Routine screening for GBS is recommended for all pregnant women. A health care provider takes a sample of the fluid in your vagina and rectum with a swab. It is then sent to a lab to be checked for GBS. A sample of your urine may also be checked for the bacteria.  TREATMENT If you test positive for GBS, you may need treatment with an antibiotic medicine during  labor. As soon as you go into labor, or as soon as your membranes rupture, you will get the antibiotic medicine through an IV access. You will continue to get the medicine until after you give birth. You do not need antibiotic medicine if you are having a cesarean delivery.If your baby shows signs or symptoms of GBS after birth, your baby can also be treated with an antibiotic medicine. HOME CARE INSTRUCTIONS   Take all antibiotic medicine as prescribed by your health care provider. Only take medicine as directed.   Continue with prenatal visits and care.   Keep all follow-up appointments.  SEEK MEDICAL CARE IF:   You have pain when you urinate.   You have to urinate frequently.   You have a fever.  SEEK IMMEDIATE MEDICAL CARE IF:   Your membranes rupture.  You go into labor. Document Released: 03/08/2008 Document Revised: 12/05/2013 Document Reviewed: 09/22/2013 [facility] Patient Information 2015 [place], [place]. This information is not intended to replace advice given to you by your health care provider. Make sure you discuss any questions you have with your health care provider.

## 2015-07-03 ENCOUNTER — Encounter: Payer: Self-pay | Admitting: Family

## 2015-07-03 ENCOUNTER — Encounter: Payer: Medicaid Other | Admitting: Certified Nurse Midwife

## 2015-07-03 NOTE — Progress Notes (Signed)
Subjective:  Brenda Spencer is a 24 y.o. [phone] at [redacted]w[redacted]d being seen today for ongoing prenatal care.  Patient reports URI sx.  Contractions: Not present.  Vag. Bleeding: None. Movement: Present. Denies leaking of fluid.   The following portions of the patient's history were reviewed and updated as appropriate: allergies, current medications, past family history, past medical history, past social history, past surgical history and problem list.   Objective:   Filed Vitals:   07/02/15 1603  BP: 122/78  Pulse: 102  Temp: 98.6 F (37 C)  Weight: 198 lb 4.8 oz (89.948 kg)    Fetal Status: Fetal Heart Rate (bpm): 135 Fundal Height: 42 cm Movement: Present     General:  Alert, oriented and cooperative. Patient is in no acute distress.  Skin: Skin is warm and dry. No rash noted.   Cardiovascular: Normal heart rate noted  Respiratory: Normal respiratory effort, no problems with respiration noted  Abdomen: Soft, gravid, appropriate for gestational age. Pain/Pressure: Present     Vaginal: Vag. Bleeding: None.       Cervix: Not evaluated        Extremities: Normal range of motion.  Edema: None  Mental Status: Normal mood and affect. Normal behavior. Normal judgment and thought content.   Urinalysis: Urine Protein: Negative Urine Glucose: Negative  Assessment and Plan:  Pregnancy: [id number] at [redacted]w[redacted]d  1. Hx of preeclampsia, prior pregnancy, currently pregnant, third trimester  - aspirin 81 MG chewable tablet; Chew 1 tablet (81 mg total) by mouth daily.  Dispense: 90 tablet; Refill: 2  2. Encounter for supervision of other normal pregnancy in first trimester  - US OB Follow Up; 07/10/2015  3. Hereditary disease in family possibly affecting fetus, fetus 76  - US OB Follow Up; 07/10/2015  4. Parent of child with chromosome abnormality  - US OB Follow Up; 07/10/2015  5. Group B streptococcal infection during pregnancy  Treat in labor   6. Back spasm  - cyclobenzaprine (FLEXERIL) 5  MG tablet; Take 1 tablet (5 mg total) by mouth 3 (three) times daily as needed for muscle spasms.  Dispense: 30 tablet; Refill: 0  7. Uterine size date discrepancy, antepartum, third trimester  - US OB Follow Up; 07/10/2015  Preterm labor symptoms and general obstetric precautions including but not limited to vaginal bleeding, contractions, leaking of fluid and fetal movement were reviewed in detail with the patient. Please refer to After Visit Summary for other counseling recommendations.  Return in about 2 weeks (around 07/16/2015).   Lavonia Drafts, MD

## 2015-07-04 ENCOUNTER — Encounter: Payer: Self-pay | Admitting: Obstetrics & Gynecology

## 2015-07-04 ENCOUNTER — Other Ambulatory Visit: Payer: Medicaid Other

## 2015-07-04 DIAGNOSIS — Z8759 Personal history of other complications of pregnancy, childbirth and the puerperium: Secondary | ICD-10-CM

## 2015-07-04 LAB — CBC
HCT: 31.6 % — ABNORMAL LOW (ref 36.0–46.0)
HEMOGLOBIN: 10.2 g/dL — AB (ref 12.0–15.0)
MCH: 26.5 pg (ref 26.0–34.0)
MCHC: 32.3 g/dL (ref 30.0–36.0)
MCV: 82.1 fL (ref 78.0–100.0)
MPV: 11.7 fL (ref 8.6–12.4)
Platelets: 225 10*3/uL (ref 150–400)
RBC: 3.85 MIL/uL — ABNORMAL LOW (ref 3.87–5.11)
RDW: 14.8 % (ref 11.5–15.5)
WBC: 9.6 10*3/uL (ref 4.0–10.5)

## 2015-07-05 LAB — RPR

## 2015-07-05 LAB — GLUCOSE TOLERANCE, 1 HOUR

## 2015-07-05 LAB — HIV ANTIBODY (ROUTINE TESTING W REFLEX): HIV 1&2 Ab, 4th Generation: NONREACTIVE

## 2015-07-09 ENCOUNTER — Encounter (HOSPITAL_COMMUNITY): Payer: Self-pay | Admitting: *Deleted

## 2015-07-09 ENCOUNTER — Inpatient Hospital Stay (HOSPITAL_COMMUNITY)
Admission: AD | Admit: 2015-07-09 | Discharge: 2015-07-09 | Disposition: A | Discharge status: Home / Self Care | Payer: Medicaid Other | Source: Ambulatory Visit | Attending: Obstetrics and Gynecology | Admitting: Obstetrics and Gynecology

## 2015-07-09 DIAGNOSIS — O99810 Abnormal glucose complicating pregnancy: Secondary | ICD-10-CM | POA: Diagnosis present

## 2015-07-09 DIAGNOSIS — N949 Unspecified condition associated with female genital organs and menstrual cycle: Secondary | ICD-10-CM | POA: Diagnosis not present

## 2015-07-09 DIAGNOSIS — O26843 Uterine size-date discrepancy, third trimester: Secondary | ICD-10-CM

## 2015-07-09 DIAGNOSIS — O26893 Other specified pregnancy related conditions, third trimester: Secondary | ICD-10-CM

## 2015-07-09 DIAGNOSIS — Z3A29 29 weeks gestation of pregnancy: Secondary | ICD-10-CM | POA: Diagnosis not present

## 2015-07-09 DIAGNOSIS — R102 Pelvic and perineal pain unspecified side: Secondary | ICD-10-CM

## 2015-07-09 DIAGNOSIS — B951 Streptococcus, group B, as the cause of diseases classified elsewhere: Secondary | ICD-10-CM

## 2015-07-09 DIAGNOSIS — O9989 Other specified diseases and conditions complicating pregnancy, childbirth and the puerperium: Secondary | ICD-10-CM | POA: Diagnosis not present

## 2015-07-09 DIAGNOSIS — M549 Dorsalgia, unspecified: Secondary | ICD-10-CM | POA: Diagnosis not present

## 2015-07-09 DIAGNOSIS — M545 Low back pain: Secondary | ICD-10-CM | POA: Diagnosis present

## 2015-07-09 DIAGNOSIS — O98819 Other maternal infectious and parasitic diseases complicating pregnancy, unspecified trimester: Secondary | ICD-10-CM

## 2015-07-09 LAB — WET PREP, GENITAL
Clue Cells Wet Prep HPF POC: NONE SEEN
Trich, Wet Prep: NONE SEEN
YEAST WET PREP: NONE SEEN

## 2015-07-09 LAB — URINALYSIS, ROUTINE W REFLEX MICROSCOPIC
Glucose, UA: NEGATIVE mg/dL
HGB URINE DIPSTICK: NEGATIVE
Ketones, ur: NEGATIVE mg/dL
Nitrite: NEGATIVE
Protein, ur: NEGATIVE mg/dL
Specific Gravity, Urine: 1.02 (ref 1.005–1.030)
UROBILINOGEN UA: 2 mg/dL — AB (ref 0.0–1.0)
pH: 6.5 (ref 5.0–8.0)

## 2015-07-09 LAB — GLUCOSE TOLERANCE, 1 HOUR (50G) W/O FASTING: Glucose, 1 Hour GTT: 136 mg/dL (ref 70–140)

## 2015-07-09 LAB — URINE MICROSCOPIC-ADD ON

## 2015-07-09 NOTE — MAU Note (Signed)
Been having a lot of pelvic pressure, now it is going into her low back, causing pain.

## 2015-07-09 NOTE — MAU Provider Note (Signed)
History   CSN: [id number]  Arrival date and time: 07/09/15 1011   First Provider Initiated Contact with Patient 07/09/15 1223     Chief Complaint  Patient presents with  . Pelvic Pain  . Back Pain   HPI  Patient is 24 y.o. [id number] [redacted]w[redacted]d here with complaints of lower back pain and pelvic pressure that started yesterday. She says it seems to be getting worse. Both the lower back pain/pelvic pain get worse with movement. Current pain level is a 7/10. The lower back pain and pelvic pain occur independently and there is no associated pain radiation to other areas of the body. The back pain is constant, whereas the pelvic pain comes and goes. Denies any vaginal bleeding or discharge, nausea, vomiting, constipation, diarrhea, dizziness, SOB, pain with urination, blood in her urine, headache, fever, or diaphoresis. There are no sick contacts around her and she has not been sexually active in the past 24 hrs. 2 weeks ago she went to [place] for a 1 week vacation and other than shopping, she went swimming 3 days out of the 7 days of her trip, but denies feeling sick or vaginal symptoms during or after her trip. She has what she believed to be a sinus infection last week and was at [facility] clinic at that time for a 1hr GTT which of she is unsure of the results.  +FM, denies LOF, VB, contractions, vaginal discharge.   Past Medical History  Diagnosis Date  . Eczema   . CHF (congestive heart failure)     RESOLVED; HAD CARDIAC EVAL   . Congestive heart failure 2011    related to preeclampsia  . Hypothyroidism AGE 26     NO TX  . Pregnancy induced hypertension 2011  . Preterm labor 2011  . Infection     UTI OCC  . Infection 2013    [place]  . Headache(784.0) 2011    MIGRAQINES; OTC  . Asthma 2009; CORNERSTONE    INHALER PRN  . Vaginal Pap smear, abnormal     colpo  . Hyperthyroidism     Past Surgical History  Procedure Laterality Date  . Tonsillectomy    . Wisdom tooth extraction    .  Tear duct probing    . Cholecystectomy      Family History  Problem Relation Age of Onset  . Anesthesia problems Neg Hx   . Other Neg Hx   . Asthma Brother   . Learning disabilities Brother   . Asthma Daughter   . Learning disabilities Daughter   . Seizures Daughter   . Learning disabilities Other   . Colon cancer Maternal Grandmother   . Cancer Maternal Grandmother     colon  . Chromosomal disorder Daughter     1q21.1 microdeletion  . Cataracts Daughter     congenital    History  Substance Use Topics  . Smoking status: Never Smoker   . Smokeless tobacco: Never Used  . Alcohol Use: No    Allergies: No Known Allergies  No prescriptions prior to admission    Review of Systems  Constitutional: Negative for fever and chills.  Eyes: Negative for blurred vision and double vision.  Respiratory: Negative for cough and shortness of breath.   Cardiovascular: Negative for chest pain and orthopnea.  Gastrointestinal: Negative for nausea and vomiting.  Genitourinary: Negative for dysuria, frequency and flank pain.  Musculoskeletal: Negative for myalgias.  Skin: Negative for rash.  Neurological: Negative for dizziness, tingling, weakness and headaches.  Endo/Heme/Allergies: Does not bruise/bleed easily.  Psychiatric/Behavioral: Negative for depression and suicidal ideas. The patient is not nervous/anxious.    Physical Exam   Blood pressure 120/74, pulse 101, temperature 98.1 F (36.7 C), temperature source Oral, resp. rate 18, last menstrual period 12/18/2014, unknown if currently breastfeeding.  Physical Exam  Constitutional: She is oriented to person, place, and time. She appears well-developed and well-nourished. No distress.  Pregnant female  HENT:  Head: Normocephalic and atraumatic.  Eyes: Conjunctivae are normal. No scleral icterus.  Neck: Normal range of motion. Neck supple.  Cardiovascular: Normal rate and intact distal pulses.   Respiratory: Effort normal. She  exhibits no tenderness.  GI: Soft. There is no tenderness. There is no rebound and no guarding.  Gravid, fundal height> dates   Genitourinary: Vagina normal.  No pooling. Visually closed ton SSE with physiological discharge.  SVE closed/th/high  Musculoskeletal: Normal range of motion. She exhibits no edema.  Neurological: She is alert and oriented to person, place, and time.  Skin: Skin is warm and dry. No rash noted.  Psychiatric: She has a normal mood and affect.    MAU Course  Procedures  MDM UA with mild infectious characteristics with pyuria Wet prep - negative for BV, candida.  NST reviewed and reactive  Assessment and Plan  Brenda Spencer is a 24 y.o. [phone] at [redacted]w[redacted]d with medical history s/f h/o PreE and preterm delivery, CHF (after PreX), h/o of chromosomal abnormality in previous child presenting with lower abdominal pressure and back pain. Likely MSK from size>dates.   #Pelvic pressure: Non-infectious givne UA and wet prep. No PTL as the patient has not ctx on fetal toco and closed on exam.    #Abnormal Osullivan's: sent message to clinic to have 3hr gtt scheduled. Informed patient of abnormal value and need for testing.   #Size>Dates: has growth [place] scheduled for tomorrow 7/27   Caren Macadam, MD    07/09/2015, 3:12 PM

## 2015-07-09 NOTE — Progress Notes (Signed)
History     CSN: [id number]  Arrival date and time: 07/09/15 1011   None     Chief Complaint  Patient presents with  . Pelvic Pain  . Back Pain   HPI Brenda Spencer is a 24 yo [phone] that is [redacted]w[redacted]d that presents today to the MAU with lower back pain and pelvic pressure that started yesterday. She says it seems to be getting worse. Both the lower back pain/pelvic pain get worse with movement. Current pain level is a 7/10. The lower back pain and pelvic pain occur independently and there is no associated pain radiation to other areas of the body. The back pain is constant, whereas the pelvic pain comes and goes. Denies any vaginal bleeding or discharge, nausea, vomiting, constipation, diarrhea, dizziness, SOB, pain with urination, blood in her urine, headache, fever, or diaphoresis. There are no sick contacts around her and she has not been sexually active in the past 24 hrs. 2 weeks ago she went to [place] for a 1 week vacation and other than shopping, she went swimming 3 days out of the 7 days of her trip, but denies feeling sick or vaginal symptoms during or after her trip. She has what she believed to be a sinus infection last week and was at [facility] at that time for a 1hr GTT which of she is unsure of the results.  OB History    Gravida Para Term Preterm AB TAB SAB Ectopic Multiple Living   '5 3 2 1 1  1   3      '$ Obstetric Comments   2011 PIH;IOL; PP CHF-HOSPITALIZED X 1 WEEK      Past Medical History  Diagnosis Date  . Eczema   . CHF (congestive heart failure)     RESOLVED; HAD CARDIAC EVAL   . Congestive heart failure 2011    related to preeclampsia  . Hypothyroidism AGE 89     NO TX  . Pregnancy induced hypertension 2011  . Preterm labor 2011  . Infection     UTI OCC  . Infection 2013    [place]  . Headache(784.0) 2011    MIGRAQINES; OTC  . Asthma 2009; CORNERSTONE    INHALER PRN  . Vaginal Pap smear, abnormal     colpo  . Hyperthyroidism     Past  Surgical History  Procedure Laterality Date  . Tonsillectomy    . Wisdom tooth extraction    . Tear duct probing    . Cholecystectomy      Family History  Problem Relation Age of Onset  . Anesthesia problems Neg Hx   . Other Neg Hx   . Asthma Brother   . Learning disabilities Brother   . Asthma Daughter   . Learning disabilities Daughter   . Seizures Daughter   . Learning disabilities Other   . Colon cancer Maternal Grandmother   . Cancer Maternal Grandmother     colon  . Chromosomal disorder Daughter     1q21.1 microdeletion  . Cataracts Daughter     congenital    History  Substance Use Topics  . Smoking status: Never Smoker   . Smokeless tobacco: Never Used  . Alcohol Use: No    Allergies: No Known Allergies  Prescriptions prior to admission  Medication Sig Dispense Refill Last Dose  . aspirin 81 MG chewable tablet Chew 1 tablet (81 mg total) by mouth daily. 90 tablet 2 07/08/2015 at Unknown time  . Prenatal Vit-Fe Fumarate-FA (PRENATAL  VITAMIN PO) Take 1 tablet by mouth daily.    Past Month at Unknown time  . acetaminophen (TYLENOL) 325 MG tablet Take 650 mg by mouth every 6 (six) hours as needed for mild pain or headache.    prn  . albuterol (PROVENTIL HFA;VENTOLIN HFA) 108 (90 BASE) MCG/ACT inhaler Inhale 2 puffs into the lungs every 6 (six) hours as needed for wheezing or shortness of breath.   prn  . cyclobenzaprine (FLEXERIL) 5 MG tablet Take 1 tablet (5 mg total) by mouth 3 (three) times daily as needed for muscle spasms. (Patient not taking: Reported on 07/09/2015) 30 tablet 0 Not Taking at Unknown time    Review of Systems  Constitutional: Negative for fever, chills and diaphoresis.  Respiratory: Negative for cough and shortness of breath.   Cardiovascular: Negative for chest pain and leg swelling.  Gastrointestinal: Positive for abdominal pain. Negative for nausea, vomiting, diarrhea and constipation.       Pelvic/groin pain  Genitourinary: Negative for  dysuria, frequency and hematuria.  Musculoskeletal: Positive for back pain.  Neurological: Negative for dizziness and headaches.   Physical Exam   Blood pressure 120/76, pulse 117, temperature 98.1 F (36.7 C), temperature source Oral, resp. rate 18, last menstrual period 12/18/2014, unknown if currently breastfeeding.  Physical Exam  Constitutional: She is oriented to person, place, and time. She appears well-developed and well-nourished. She appears distressed.  HENT:  Head: Normocephalic and atraumatic.  Eyes: Conjunctivae and EOM are normal.  Cardiovascular: Normal rate, regular rhythm and normal heart sounds.  Exam reveals no gallop and no friction rub.   No murmur heard. Respiratory: Effort normal and breath sounds normal. No respiratory distress.  GI: Soft.  Musculoskeletal: She exhibits no edema.  Neurological: She is alert and oriented to person, place, and time.  Skin: Skin is warm and dry. She is not diaphoretic.  Psychiatric: She has a normal mood and affect. Her behavior is normal. Judgment and thought content normal.  Leg: There was some R. Leg posterior calf tenderness with moderately applied pressure and + Homan sign in the right leg.  FHR: Baseline rate 150bpm  MAU Course  Procedures   Assessment and Plan  [name] is a 24 year old female [phone] that is [redacted]w[redacted]d, that presents with some preterm labor of lower back pain and pelvic pressure that is more consistent with round ligament pain/pain associated growth in pregnancy. The absence of vaginal bleeding or discharge, and lack of contractions noted here in the MAU point me away from preterm labor, however due to the increase in pain that she has experienced since yesterday. I still want to rule out preterm labor/infectious etiologies(suspicion is low given her vitals). She has a history of preeclampsia in prior pregnancy but is on aspirin, BP today 120/76.  Plan: Urine culture, since asymptomatic bacteriuria is  associated with an increased risk of preterm labor and birth. She also has a history of GBS+urine in prior pregnancy. Fetal fibronectin (fFN) since she is less than 34 weeks Speculum exam to look for cervical dilation >3cm Wet prep If results are unremarkable, discharge to home on preterm labor precautions, with expected OB f/u US scheduled for 07/10/2015@15 :[place]. 07/09/2015, 11:19 AM

## 2015-07-10 ENCOUNTER — Encounter: Payer: Self-pay | Admitting: Obstetrics & Gynecology

## 2015-07-10 ENCOUNTER — Encounter (HOSPITAL_COMMUNITY): Payer: Self-pay

## 2015-07-10 ENCOUNTER — Other Ambulatory Visit: Payer: Self-pay | Admitting: Obstetrics & Gynecology

## 2015-07-10 ENCOUNTER — Other Ambulatory Visit: Payer: Medicaid Other

## 2015-07-10 ENCOUNTER — Ambulatory Visit (HOSPITAL_COMMUNITY)
Admission: RE | Admit: 2015-07-10 | Discharge: 2015-07-10 | Disposition: A | Discharge status: Home / Self Care | Payer: Medicaid Other | Source: Ambulatory Visit | Attending: Obstetrics & Gynecology | Admitting: Obstetrics & Gynecology

## 2015-07-10 VITALS — BP 128/78 | HR 110 | Wt 197.0 lb

## 2015-07-10 DIAGNOSIS — O26843 Uterine size-date discrepancy, third trimester: Secondary | ICD-10-CM

## 2015-07-10 DIAGNOSIS — Z3481 Encounter for supervision of other normal pregnancy, first trimester: Secondary | ICD-10-CM

## 2015-07-10 DIAGNOSIS — R7309 Other abnormal glucose: Secondary | ICD-10-CM

## 2015-07-10 DIAGNOSIS — Z3A00 Weeks of gestation of pregnancy not specified: Secondary | ICD-10-CM | POA: Diagnosis not present

## 2015-07-10 DIAGNOSIS — Z3A32 32 weeks gestation of pregnancy: Secondary | ICD-10-CM

## 2015-07-10 DIAGNOSIS — O26849 Uterine size-date discrepancy, unspecified trimester: Secondary | ICD-10-CM | POA: Insufficient documentation

## 2015-07-10 DIAGNOSIS — Z3A29 29 weeks gestation of pregnancy: Secondary | ICD-10-CM | POA: Insufficient documentation

## 2015-07-11 ENCOUNTER — Telehealth: Payer: Self-pay | Admitting: *Deleted

## 2015-07-11 LAB — GLUCOSE TOLERANCE, 3 HOURS
GLUCOSE 3 HOUR GTT: 60 mg/dL — AB (ref 70–144)
GLUCOSE, 1 HOUR-GESTATIONAL: 186 mg/dL (ref 70–189)
Glucose Tolerance, 2 hour: 154 mg/dL (ref 70–164)
Glucose Tolerance, Fasting: 80 mg/dL (ref 65–99)

## 2015-07-11 NOTE — Telephone Encounter (Signed)
Received message left on nurse line on 07/11/15 at 0932.  Patient requests a return call with her 3 hour gtt results.  Phoned patient.  3 hour gtt results given.  Patient states understanding.

## 2015-07-15 ENCOUNTER — Telehealth: Payer: Self-pay | Admitting: *Deleted

## 2015-07-15 NOTE — Telephone Encounter (Signed)
Pt has completed 3 hour gtt.

## 2015-07-15 NOTE — Telephone Encounter (Signed)
-----   Message from Woodroe Mode, MD sent at 07/14/2015  3:26 PM EDT ----- 136 1 hr, needs 3 hr GTT

## 2015-07-16 ENCOUNTER — Ambulatory Visit (INDEPENDENT_AMBULATORY_CARE_PROVIDER_SITE_OTHER): Payer: Medicaid Other | Admitting: Obstetrics and Gynecology

## 2015-07-16 ENCOUNTER — Encounter: Payer: Self-pay | Admitting: Obstetrics and Gynecology

## 2015-07-16 VITALS — BP 126/86 | HR 122 | Temp 98.1°F | Wt 198.4 lb

## 2015-07-16 DIAGNOSIS — Z23 Encounter for immunization: Secondary | ICD-10-CM | POA: Diagnosis not present

## 2015-07-16 DIAGNOSIS — O99013 Anemia complicating pregnancy, third trimester: Secondary | ICD-10-CM | POA: Diagnosis present

## 2015-07-16 DIAGNOSIS — O99019 Anemia complicating pregnancy, unspecified trimester: Secondary | ICD-10-CM

## 2015-07-16 LAB — POCT URINALYSIS DIP (DEVICE)
GLUCOSE, UA: NEGATIVE mg/dL
KETONES UR: NEGATIVE mg/dL
Nitrite: NEGATIVE
Protein, ur: 30 mg/dL — AB
SPECIFIC GRAVITY, URINE: 1.025 (ref 1.005–1.030)
Urobilinogen, UA: 1 mg/dL (ref 0.0–1.0)
pH: 6 (ref 5.0–8.0)

## 2015-07-16 LAB — FERRITIN: FERRITIN: 6 ng/mL — AB (ref 10–291)

## 2015-07-16 MED ORDER — FERROUS SULFATE 325 (65 FE) MG PO TABS: 325.0000 mg | ORAL_TABLET | Freq: Two times a day (BID) | ORAL | Status: DC

## 2015-07-16 NOTE — Progress Notes (Signed)
Subjective:  Brenda Spencer is a 24 y.o. [phone] at [redacted]w[redacted]d being seen today for ongoing prenatal care.  Patient reports vaginal pressure, no significant discharge, no contractions.  Contractions: Not present.  Vag. Bleeding: None. Movement: Present. Denies leaking of fluid.   The following portions of the patient's history were reviewed and updated as appropriate: allergies, current medications, past family history, past medical history, past social history, past surgical history and problem list.   Objective:   Filed Vitals:   07/16/15 1519  BP: 126/86  Pulse: 122  Temp: 98.1 F (36.7 C)  Weight: 198 lb 6.4 oz (89.994 kg)    Fetal Status: Fetal Heart Rate (bpm): 146 Fundal Height: 40 cm Movement: Present     General:  Alert, oriented and cooperative. Patient is in no acute distress.  Skin: Skin is warm and dry. No rash noted.   Cardiovascular: Normal heart rate noted  Respiratory: Normal respiratory effort, no problems with respiration noted  Abdomen: Soft, gravid, appropriate for gestational age. Pain/Pressure: Present     Vaginal: Vag. Bleeding: None.       Cervix: Not evaluated        Extremities: Normal range of motion.     Mental Status: Normal mood and affect. Normal behavior. Normal judgment and thought content.   Urinalysis:      Assessment and Plan:  Pregnancy: [id number] at [redacted]w[redacted]d  # supervision of pregnancy - tdap today  # asthma - 3 days of requriing inhaler daily - if persistent daily need, would start inhaled corticosteroid  # Anemia - mild, h 10.2 - ferritin today for confirmation - start ferrous sulfate 325 mg po bid - repeat h/h in 4 weeks  # siz3e > dates - growth scan 7/27 wnl though upper limit of normal afi - repeat scan ordered for 8/17  Preterm labor symptoms and general obstetric precautions including but not limited to vaginal bleeding, contractions, leaking of fluid and fetal movement were reviewed in detail with the patient. Please refer to  After Visit Summary for other counseling recommendations.  Return in about 2 weeks (around 07/30/2015).   Gwynne Edinger, MD

## 2015-07-24 ENCOUNTER — Telehealth: Payer: Self-pay | Admitting: *Deleted

## 2015-07-24 ENCOUNTER — Inpatient Hospital Stay (HOSPITAL_COMMUNITY)
Admission: AD | Admit: 2015-07-24 | Discharge: 2015-07-24 | Disposition: A | Discharge status: Home / Self Care | Payer: Medicaid Other | Source: Ambulatory Visit | Attending: Obstetrics & Gynecology | Admitting: Obstetrics & Gynecology

## 2015-07-24 ENCOUNTER — Encounter (HOSPITAL_COMMUNITY): Payer: Self-pay | Admitting: *Deleted

## 2015-07-24 DIAGNOSIS — O9989 Other specified diseases and conditions complicating pregnancy, childbirth and the puerperium: Secondary | ICD-10-CM | POA: Diagnosis not present

## 2015-07-24 DIAGNOSIS — O26899 Other specified pregnancy related conditions, unspecified trimester: Secondary | ICD-10-CM

## 2015-07-24 DIAGNOSIS — R109 Unspecified abdominal pain: Secondary | ICD-10-CM | POA: Insufficient documentation

## 2015-07-24 DIAGNOSIS — O99891 Other specified diseases and conditions complicating pregnancy: Secondary | ICD-10-CM

## 2015-07-24 DIAGNOSIS — O26843 Uterine size-date discrepancy, third trimester: Secondary | ICD-10-CM

## 2015-07-24 DIAGNOSIS — O99019 Anemia complicating pregnancy, unspecified trimester: Secondary | ICD-10-CM | POA: Diagnosis not present

## 2015-07-24 DIAGNOSIS — M549 Dorsalgia, unspecified: Secondary | ICD-10-CM | POA: Diagnosis not present

## 2015-07-24 DIAGNOSIS — O98819 Other maternal infectious and parasitic diseases complicating pregnancy, unspecified trimester: Secondary | ICD-10-CM

## 2015-07-24 DIAGNOSIS — Z3A31 31 weeks gestation of pregnancy: Secondary | ICD-10-CM | POA: Insufficient documentation

## 2015-07-24 DIAGNOSIS — B951 Streptococcus, group B, as the cause of diseases classified elsewhere: Secondary | ICD-10-CM

## 2015-07-24 LAB — WET PREP, GENITAL
Clue Cells Wet Prep HPF POC: NONE SEEN
Trich, Wet Prep: NONE SEEN
Yeast Wet Prep HPF POC: NONE SEEN

## 2015-07-24 LAB — URINALYSIS, ROUTINE W REFLEX MICROSCOPIC
Glucose, UA: NEGATIVE mg/dL
Hgb urine dipstick: NEGATIVE
Ketones, ur: 15 mg/dL — AB
NITRITE: NEGATIVE
PH: 6.5 (ref 5.0–8.0)
PROTEIN: NEGATIVE mg/dL
Specific Gravity, Urine: >1.03 — ABNORMAL HIGH (ref 1.005–1.030)
Urobilinogen, UA: 4 mg/dL — ABNORMAL HIGH (ref 0.0–1.0)

## 2015-07-24 LAB — URINE MICROSCOPIC-ADD ON

## 2015-07-24 MED ORDER — CYCLOBENZAPRINE HCL 10 MG PO TABS: 10.0000 mg | ORAL_TABLET | Freq: Three times a day (TID) | ORAL | Status: DC | PRN

## 2015-07-24 NOTE — MAU Provider Note (Signed)
History     CSN: [id number]  Arrival date and time: 07/24/15 1632   None     Chief Complaint  Patient presents with  . Back Pain   HPI Patient is 24 y.o. [id number] with complaints of back pain since 9 am this morning. Pain has been constant in nature midline across the lower back. Pain has been increasing in intensity. States that she occasionally has abdominal cramps/contractions, but that the majority of the pain is in her back. Has taken tylenol without relief. Denies sexual activity within the past 24 hours.   +FM, denies LOF, VB, contractions, vaginal discharge.  OB History    Gravida Para Term Preterm AB TAB SAB Ectopic Multiple Living   _0 Obstetric Comments   2011 PIH;IOL; PP CHF-HOSPITALIZED X 1 WEEK      Past Medical History  Diagnosis Date  . Eczema   . CHF (congestive heart failure)     RESOLVED; HAD CARDIAC EVAL   . Congestive heart failure 2011    related to preeclampsia  . Hypothyroidism AGE 24    NO TX  . Pregnancy induced hypertension 2011  . Preterm labor 2011  . Infection     UTI OCC  . Infection 2013    [place] . Headache(784.0) 2011    MIGRAQINES; OTC  . Asthma 2009; CORNERSTONE    INHALER PRN  . Vaginal Pap smear, abnormal     colpo  . Hyperthyroidism     Past Surgical History  Procedure Laterality Date  . Tonsillectomy    . Wisdom tooth extraction    . Tear duct probing    . Cholecystectomy      Family History  Problem Relation Age of Onset  . Anesthesia problems Neg Hx   . Other Neg Hx   . Asthma Brother   . Learning disabilities Brother   . Asthma Daughter   . Learning disabilities Daughter   . Seizures Daughter   . Learning disabilities Other   . Colon cancer Maternal Grandmother   . Cancer Maternal Grandmother     colon  . Chromosomal disorder Daughter     1q21.1 microdeletion  . Cataracts Daughter     congenital    Social History  Substance Use Topics  . Smoking status: Never  Smoker   . Smokeless tobacco: Never Used  . Alcohol Use: No    Allergies: No Known Allergies  Prescriptions prior to admission  Medication Sig Dispense Refill Last Dose  . albuterol (PROVENTIL HFA;VENTOLIN HFA) 108 (90 BASE) MCG/ACT inhaler Inhale 2 puffs into the lungs every 6 (six) hours as needed for wheezing or shortness of breath.   rescue  . aspirin 81 MG chewable tablet Chew 1 tablet (81 mg total) by mouth daily. 90 tablet 2 07/23/2015 at Unknown time  . ferrous sulfate (FERROUSUL) 325 (65 FE) MG tablet Take 1 tablet (325 mg total) by mouth 2 (two) times daily. (Patient taking differently: Take 325 mg by mouth daily. ) 60 tablet 1 07/23/2015 at Unknown time  . Menthol, Topical Analgesic, (ICY HOT EX) Apply 1 application topically as needed (back ache).   07/23/2015 at Unknown time  . Prenatal Vit-Fe Fumarate-FA (PRENATAL VITAMIN PO) Take 1 tablet by mouth daily.    07/23/2015 at Unknown time  . cyclobenzaprine (FLEXERIL) 5 MG tablet Take 1 tablet (5 mg total) by mouth 3 (three) times daily as needed  for muscle spasms. (Patient not taking: Reported on 07/24/2015) 30 tablet 0 Not Taking at Unknown time    Review of Systems  Constitutional: Negative for fever, chills and malaise/fatigue.  HENT: Negative for congestion.   Eyes: Negative for blurred vision and double vision.  Respiratory: Negative for cough and shortness of breath.   Cardiovascular: Negative for chest pain, palpitations, claudication and leg swelling.  Gastrointestinal: Positive for abdominal pain (occasional cramps). Negative for heartburn, nausea, vomiting, diarrhea and constipation.  Genitourinary: Negative for dysuria, urgency, frequency and hematuria.  Musculoskeletal: Positive for back pain. Negative for myalgias.  Skin: Negative for itching and rash.  Neurological: Negative for dizziness, loss of consciousness and headaches.   Physical Exam   Blood pressure 138/91, pulse 118, temperature 98.3 F (36.8 C), temperature  source Oral, resp. rate 18, height _0  (1.549 m), weight 200 lb 6.4 oz (90.901 kg), last menstrual period 12/18/2014, unknown if currently breastfeeding.  Physical Exam  Constitutional: She is oriented to person, place, and time. She appears well-developed and well-nourished. No distress.  HENT:  Head: Normocephalic and atraumatic.  Eyes: Conjunctivae and EOM are normal.  Neck: Normal range of motion. No thyromegaly present.  Cardiovascular: Normal rate, regular rhythm and normal heart sounds.  Exam reveals no gallop and no friction rub.   No murmur heard. Respiratory: Breath sounds normal. No respiratory distress. She has no wheezes. She has no rales.  GI: Soft. Bowel sounds are normal. She exhibits no distension. There is no tenderness.  Genitourinary: Vaginal discharge found.  Musculoskeletal: Normal range of motion. She exhibits no edema.  Neurological: She is alert and oriented to person, place, and time.  Skin: Skin is warm and dry. No rash noted. No erythema.  Psychiatric: She has a normal mood and affect. Her behavior is normal.  Dilation: Fingertip Effacement (%): Thick Cervical Position: Posterior Exam by:: Juleen China MD  FHR: baseline 150, good variability, + accels, no decels Toco: occasional UC  SSE: Cervical os visually closed. White milky discharge pooling in posterior fornix.    MAU Course  Procedures  MDM NST-reviewed and reactive  UA SSE: Wet prep, GC/chlamydia  Cervical exam   Assessment and Plan  Patient is 24 y.o. [id number] with complaints of back pain.  -wet prep negative  -Rx for flexeril  -encouraged good hydration; UA showed signs of dehydration  -fetal kick counts reinforced -preterm labor precautions given  -stable for discharge to home   KCaren Macadam8/09/2015, 6:47 PM

## 2015-07-24 NOTE — Progress Notes (Signed)
Dr Juleen China called and made aware of pt's complaints, states she will come and see pt

## 2015-07-24 NOTE — MAU Note (Signed)
Pt reprots having back pain and increased pelvic pain/pressure all day today. Reports some mild ctx but not steady

## 2015-07-24 NOTE — Telephone Encounter (Signed)
Pt left message stating that she is feeling pressure and back pain.  She also is having some UC's and feels dizzy. Please call back.

## 2015-07-24 NOTE — Discharge Instructions (Signed)
Call the clinic or go to [facility] if:  You begin to have strong, frequent contractions  Your water breaks.  Sometimes it is a big gush of fluid, sometimes it is just a trickle that keeps getting your panties wet or running down your legs  You have vaginal bleeding.  It is normal to have a small amount of spotting if your cervix was checked.   You don't feel your baby moving like normal.  If you don't, get you something to eat and drink and lay down and focus on feeling your baby move.  You should feel at least 10 movements in 2 hours.  If you don't, you should call the office or go to [facility].

## 2015-07-25 LAB — GC/CHLAMYDIA PROBE AMP (~~LOC~~) NOT AT ARMC
Chlamydia: NEGATIVE
NEISSERIA GONORRHEA: NEGATIVE

## 2015-07-25 NOTE — Telephone Encounter (Signed)
Pt contacted the clinic requesting test results.

## 2015-07-25 NOTE — Telephone Encounter (Signed)
Contacted patient, pt requesting iron results from several weeks ago.  Results given. Pt verbalizes understanding and has no further questions,.

## 2015-07-31 ENCOUNTER — Encounter: Payer: Self-pay | Admitting: Advanced Practice Midwife

## 2015-07-31 ENCOUNTER — Ambulatory Visit (INDEPENDENT_AMBULATORY_CARE_PROVIDER_SITE_OTHER): Payer: Medicaid Other | Admitting: Advanced Practice Midwife

## 2015-07-31 ENCOUNTER — Encounter (HOSPITAL_COMMUNITY): Payer: Self-pay

## 2015-07-31 ENCOUNTER — Ambulatory Visit (HOSPITAL_COMMUNITY)
Admission: RE | Admit: 2015-07-31 | Discharge: 2015-07-31 | Disposition: A | Discharge status: Home / Self Care | Payer: Medicaid Other | Source: Ambulatory Visit | Attending: Obstetrics & Gynecology | Admitting: Obstetrics & Gynecology

## 2015-07-31 ENCOUNTER — Other Ambulatory Visit: Payer: Self-pay | Admitting: Obstetrics & Gynecology

## 2015-07-31 VITALS — BP 129/78 | HR 108 | Wt 199.5 lb

## 2015-07-31 VITALS — BP 126/77 | HR 108 | Wt 198.9 lb

## 2015-07-31 DIAGNOSIS — Z3481 Encounter for supervision of other normal pregnancy, first trimester: Secondary | ICD-10-CM

## 2015-07-31 DIAGNOSIS — O26843 Uterine size-date discrepancy, third trimester: Secondary | ICD-10-CM | POA: Diagnosis not present

## 2015-07-31 DIAGNOSIS — Z8751 Personal history of pre-term labor: Secondary | ICD-10-CM

## 2015-07-31 DIAGNOSIS — Z3A32 32 weeks gestation of pregnancy: Secondary | ICD-10-CM

## 2015-07-31 DIAGNOSIS — O99213 Obesity complicating pregnancy, third trimester: Secondary | ICD-10-CM

## 2015-07-31 DIAGNOSIS — O09293 Supervision of pregnancy with other poor reproductive or obstetric history, third trimester: Secondary | ICD-10-CM

## 2015-07-31 DIAGNOSIS — R109 Unspecified abdominal pain: Secondary | ICD-10-CM

## 2015-07-31 LAB — POCT URINALYSIS DIP (DEVICE)
Glucose, UA: NEGATIVE mg/dL
Ketones, ur: NEGATIVE mg/dL
NITRITE: NEGATIVE
PH: 6.5 (ref 5.0–8.0)
Protein, ur: NEGATIVE mg/dL
Specific Gravity, Urine: 1.025 (ref 1.005–1.030)
Urobilinogen, UA: 2 mg/dL — ABNORMAL HIGH (ref 0.0–1.0)

## 2015-07-31 NOTE — Patient Instructions (Signed)
Postpartum Tubal Ligation A postpartum tubal ligation (PPTL) is a procedure that blocks the fallopian tubes right after childbirth or 1-2 days after childbirth. PPTL is done before the uterus returns to its normal location. The procedure is also called a minilaparotomy. By blocking the fallopian tubes, the eggs that are released from the ovaries cannot enter the uterus and sperm cannot reach the egg. A PPTL is done so you will not be able to get pregnant or have a baby.  Although this procedure may be reversed, it should be considered permanent and irreversible. If you want to have future pregnancies, you should not have this procedure. LET YOUR CAREGIVER KNOW ABOUT:  Allergies to food or medicine.  Medicines taken, including vitamins, herbs, eyedrops, over-the-counter medicines, and creams.  Use of steroids (by mouth or creams).  Previous problems with numbing medicines.  History of bleeding problems or blood clots.  Any recent colds or infections.  Previous surgery.  Other health problems, including diabetes and kidney problems. RISKS AND COMPLICATIONS  Infection.  Bleeding.  Injury to other organs.  Anesthetic side effects.  Failure of the procedure.  Ectopic pregnancy.  Future regret about having the procedure done. BEFORE THE PROCEDURE   You may need to sign certain permission forms with your insurance up to 30 days before your due date.  After delivering your baby, you cannot eat or drink anything if the procedure is performed the same day. If you are having the procedure a day after delivering, you may be able to eat and drink until midnight. Your caregiver will give you specific directions depending on your situation. PROCEDURE   If done 1-2 days after delivery:  You will be given a medicine to make you sleep (general anesthetic) during the procedure.  A tube will be put down your throat to help your breath while under general anesthesia.  A small cut  (incision) is made just beneath the belly button.  The fallopian tubes are brought up through the incision.  The fallopian tubes are then sealed, tied, or cut.  If done after a caesarean delivery:  Tubal ligation is done through the incision already made (after the baby is delivered).  Once the tubes are blocked, the incision is closed with stitches (sutures). A bandage will be placed over the incisions. AFTER THE PROCEDURE  You may have some pain or cramps in the abdominal area for the next 3-7 days.  You will be given pain medicine to ease any discomfort.  You may also feel sick to your stomach if you were given a general anesthetic.  You may have some mild discomfort in the throat. This is from the tube that may have been placed in your throat while you were sleeping.  You may feel tired and should rest the remainder of the day. Document Released: 11/30/2005 Document Revised: 05/31/2012 Document Reviewed: 03/12/2012 [facility] Patient Information 2015 [place], [place]. This information is not intended to replace advice given to you by your health care provider. Make sure you discuss any questions you have with your health care provider.  Braxton Hicks Contractions Contractions of the uterus can occur throughout pregnancy. Contractions are not always a sign that you are in labor.  WHAT ARE BRAXTON HICKS CONTRACTIONS?  Contractions that occur before labor are called Braxton Hicks contractions, or false labor. Toward the end of pregnancy (32-34 weeks), these contractions can develop more often and may become more forceful. This is not true labor because these contractions do not result in opening (  dilatation) and thinning of the cervix. They are sometimes difficult to tell apart from true labor because these contractions can be forceful and people have different pain tolerances. You should not feel embarrassed if you go to the hospital with false labor. Sometimes, the only way to tell if you  are in true labor is for your health care provider to look for changes in the cervix. If there are no prenatal problems or other health problems associated with the pregnancy, it is completely safe to be sent home with false labor and await the onset of true labor. HOW CAN YOU TELL THE DIFFERENCE BETWEEN TRUE AND FALSE LABOR? False Labor  The contractions of false labor are usually shorter and not as hard as those of true labor.   The contractions are usually irregular.   The contractions are often felt in the front of the lower abdomen and in the groin.   The contractions may go away when you walk around or change positions while lying down.   The contractions get weaker and are shorter lasting as time goes on.   The contractions do not usually become progressively stronger, regular, and closer together as with true labor.  True Labor  Contractions in true labor last 30-70 seconds, become very regular, usually become more intense, and increase in frequency.   The contractions do not go away with walking.   The discomfort is usually felt in the top of the uterus and spreads to the lower abdomen and low back.   True labor can be determined by your health care provider with an exam. This will show that the cervix is dilating and getting thinner.  WHAT TO REMEMBER  Keep up with your usual exercises and follow other instructions given by your health care provider.   Take medicines as directed by your health care provider.   Keep your regular prenatal appointments.   Eat and drink lightly if you think you are going into labor.   If Braxton Hicks contractions are making you uncomfortable:   Change your position from lying down or resting to walking, or from walking to resting.   Sit and rest in a tub of warm water.   Drink 2-3 glasses of water. Dehydration may cause these contractions.   Do slow and deep breathing several times an hour.  WHEN SHOULD I SEEK  IMMEDIATE MEDICAL CARE? Seek immediate medical care if:  Your contractions become stronger, more regular, and closer together.   You have fluid leaking or gushing from your vagina.   You have a fever.   You pass blood-tinged mucus.   You have vaginal bleeding.   You have continuous abdominal pain.   You have low back pain that you never had before.   You feel your baby's head pushing down and causing pelvic pressure.   Your baby is not moving as much as it used to.  Document Released: 11/30/2005 Document Revised: 12/05/2013 Document Reviewed: 09/11/2013 [facility] Patient Information 2015 [place], [place]. This information is not intended to replace advice given to you by your health care provider. Make sure you discuss any questions you have with your health care provider.

## 2015-07-31 NOTE — Progress Notes (Signed)
Large leukocytes in urine. Discussed breast feeding tip of the week.

## 2015-07-31 NOTE — Progress Notes (Signed)
Subjective:  Brenda Spencer is a 24 y.o. [phone] at [redacted]w[redacted]d being seen today for ongoing prenatal care.  Patient reports occasional contractions and back pain w/ contractions--left low back up to flank area. Denies dysuria, urgency, fever, chills, N/V. Has had urinary frequency througout pregnancy. .  Contractions: Irritability.  Vag. Bleeding: None. Movement: Present. Denies leaking of fluid.   Growth [place] today EFW 43%. AFI 19 cm.   The following portions of the patient's history were reviewed and updated as appropriate: allergies, current medications, past family history, past medical history, past social history, past surgical history and problem list.   Objective:   Filed Vitals:   07/31/15 0930  BP: 126/77  Pulse: 108  Weight: 198 lb 14.4 oz (90.22 kg)    Fetal Status: Fetal Heart Rate (bpm): 145 Fundal Height: 42 cm Movement: Present     General:  Alert, oriented and cooperative. Patient is in no acute distress.  Skin: Skin is warm and dry. No rash noted.   Cardiovascular: Normal heart rate noted  Respiratory: Normal respiratory effort, no problems with respiration noted  Abdomen: Soft, gravid, appropriate for gestational age. Pain/Pressure: Present . Mild CVAT and low back TTP.     Pelvic: Vag. Bleeding: None Vag D/C Character: White   Cervical exam performed Dilation: Fingertip Effacement (%): 0 Station: Ballotable  Extremities: Normal range of motion.  Edema: None  Mental Status: Normal mood and affect. Normal behavior. Normal judgment and thought content.   Urinalysis: Urine Protein: Negative (Simultaneous filing. User may not have seen previous data.) Urine Glucose: Negative (Simultaneous filing. User may not have seen previous data.)  Urine dipstick shows positive for WBC's and positive for RBC's.  Micro exam: not done.   Assessment and Plan:  Pregnancy: [id number] at [redacted]w[redacted]d  1. Flank pain  - Culture, OB Urine  2. Encounter for supervision of other normal pregnancy in  first trimester   Preterm labor symptoms and general obstetric precautions including but not limited to vaginal bleeding, contractions, leaking of fluid and fetal movement were reviewed in detail with the patient. Please refer to After Visit Summary for other counseling recommendations.  Return in about 2 weeks (around 08/14/2015).   Manya Silvas, CNM

## 2015-07-31 NOTE — Progress Notes (Signed)
Bilirubin : small, NOT:RRNHA, urobilinogen 2.0, Leukocytes: large

## 2015-08-01 LAB — CULTURE, OB URINE: Colony Count: 4000

## 2015-08-05 ENCOUNTER — Encounter (HOSPITAL_COMMUNITY): Payer: Self-pay | Admitting: *Deleted

## 2015-08-05 ENCOUNTER — Inpatient Hospital Stay (HOSPITAL_COMMUNITY)
Admission: AD | Admit: 2015-08-05 | Discharge: 2015-08-07 | DRG: 775 | Disposition: A | Discharge status: Home / Self Care | Payer: Medicaid Other | Source: Ambulatory Visit | Attending: Family Medicine | Admitting: Family Medicine

## 2015-08-05 DIAGNOSIS — R0789 Other chest pain: Secondary | ICD-10-CM | POA: Diagnosis present

## 2015-08-05 DIAGNOSIS — Z7982 Long term (current) use of aspirin: Secondary | ICD-10-CM

## 2015-08-05 DIAGNOSIS — O98819 Other maternal infectious and parasitic diseases complicating pregnancy, unspecified trimester: Secondary | ICD-10-CM

## 2015-08-05 DIAGNOSIS — O26843 Uterine size-date discrepancy, third trimester: Secondary | ICD-10-CM | POA: Diagnosis present

## 2015-08-05 DIAGNOSIS — J45909 Unspecified asthma, uncomplicated: Secondary | ICD-10-CM | POA: Diagnosis present

## 2015-08-05 DIAGNOSIS — Z3A33 33 weeks gestation of pregnancy: Secondary | ICD-10-CM | POA: Diagnosis not present

## 2015-08-05 DIAGNOSIS — Z3A32 32 weeks gestation of pregnancy: Secondary | ICD-10-CM | POA: Diagnosis present

## 2015-08-05 DIAGNOSIS — O403XX0 Polyhydramnios, third trimester, not applicable or unspecified: Secondary | ICD-10-CM | POA: Diagnosis present

## 2015-08-05 DIAGNOSIS — B951 Streptococcus, group B, as the cause of diseases classified elsewhere: Secondary | ICD-10-CM

## 2015-08-05 DIAGNOSIS — R079 Chest pain, unspecified: Secondary | ICD-10-CM

## 2015-08-05 DIAGNOSIS — O99513 Diseases of the respiratory system complicating pregnancy, third trimester: Secondary | ICD-10-CM | POA: Diagnosis present

## 2015-08-05 DIAGNOSIS — O99820 Streptococcus B carrier state complicating pregnancy: Secondary | ICD-10-CM | POA: Diagnosis present

## 2015-08-05 LAB — CBC
HCT: 30.8 % — ABNORMAL LOW (ref 36.0–46.0)
Hemoglobin: 9.5 g/dL — ABNORMAL LOW (ref 12.0–15.0)
MCH: 24.6 pg — AB (ref 26.0–34.0)
MCHC: 30.8 g/dL (ref 30.0–36.0)
MCV: 79.8 fL (ref 78.0–100.0)
Platelets: 188 10*3/uL (ref 150–400)
RBC: 3.86 MIL/uL — AB (ref 3.87–5.11)
RDW: 15.4 % (ref 11.5–15.5)
WBC: 9.2 10*3/uL (ref 4.0–10.5)

## 2015-08-05 LAB — PROTEIN / CREATININE RATIO, URINE
Creatinine, Urine: 107 mg/dL
Protein Creatinine Ratio: 0.17 mg/mg{Cre} — ABNORMAL HIGH (ref 0.00–0.15)
Total Protein, Urine: 18 mg/dL

## 2015-08-05 LAB — COMPREHENSIVE METABOLIC PANEL
ALK PHOS: 125 U/L (ref 38–126)
ALT: 10 U/L — AB (ref 14–54)
AST: 14 U/L — ABNORMAL LOW (ref 15–41)
Albumin: 2.5 g/dL — ABNORMAL LOW (ref 3.5–5.0)
Anion gap: 9 (ref 5–15)
BUN: 6 mg/dL (ref 6–20)
CALCIUM: 8 mg/dL — AB (ref 8.9–10.3)
CO2: 23 mmol/L (ref 22–32)
CREATININE: 0.38 mg/dL — AB (ref 0.44–1.00)
Chloride: 104 mmol/L (ref 101–111)
GFR calc non Af Amer: >60 mL/min (ref >60)
Glucose, Bld: 82 mg/dL (ref 65–99)
Potassium: 3.6 mmol/L (ref 3.5–5.1)
SODIUM: 136 mmol/L (ref 135–145)
Total Bilirubin: 0.4 mg/dL (ref 0.3–1.2)
Total Protein: 6.8 g/dL (ref 6.5–8.1)

## 2015-08-05 LAB — TYPE AND SCREEN
ABO/RH(D): A POS
ANTIBODY SCREEN: NEGATIVE

## 2015-08-05 MED ORDER — ZOLPIDEM TARTRATE 5 MG PO TABS: 5.0000 mg | ORAL_TABLET | Freq: Every evening | ORAL | Status: DC | PRN

## 2015-08-05 MED ORDER — BETAMETHASONE SOD PHOS & ACET 6 (3-3) MG/ML IJ SUSP
12.0000 mg | INTRAMUSCULAR | Status: AC
  Administered 2015-08-05 – 2015-08-06 (×2): 12 mg via INTRAMUSCULAR
  Filled 2015-08-05 (×2): qty 2

## 2015-08-05 MED ORDER — NIFEDIPINE 10 MG PO CAPS
10.0000 mg | ORAL_CAPSULE | Freq: Four times a day (QID) | ORAL | Status: DC
  Administered 2015-08-06 (×2): 10 mg via ORAL
  Filled 2015-08-05 (×2): qty 1

## 2015-08-05 MED ORDER — BUTORPHANOL TARTRATE 1 MG/ML IJ SOLN
2.0000 mg | INTRAMUSCULAR | Status: DC | PRN
  Administered 2015-08-05 – 2015-08-06 (×2): 2 mg via INTRAVENOUS
  Filled 2015-08-05 (×2): qty 2

## 2015-08-05 MED ORDER — CALCIUM CARBONATE ANTACID 500 MG PO CHEW: 2.0000 | CHEWABLE_TABLET | ORAL | Status: DC | PRN

## 2015-08-05 MED ORDER — FERROUS SULFATE 325 (65 FE) MG PO TABS
325.0000 mg | ORAL_TABLET | Freq: Two times a day (BID) | ORAL | Status: DC
  Administered 2015-08-06 – 2015-08-07 (×3): 325 mg via ORAL
  Filled 2015-08-05 (×3): qty 1

## 2015-08-05 MED ORDER — PRENATAL MULTIVITAMIN CH
1.0000 | ORAL_TABLET | Freq: Every day | ORAL | Status: DC
  Administered 2015-08-06 – 2015-08-07 (×2): 1 via ORAL
  Filled 2015-08-05 (×2): qty 1

## 2015-08-05 MED ORDER — MAGNESIUM SULFATE BOLUS VIA INFUSION
4.0000 g | Freq: Once | INTRAVENOUS | Status: AC
  Administered 2015-08-05: 4 g via INTRAVENOUS
  Filled 2015-08-05: qty 500

## 2015-08-05 MED ORDER — DOCUSATE SODIUM 100 MG PO CAPS
100.0000 mg | ORAL_CAPSULE | Freq: Every day | ORAL | Status: DC
  Administered 2015-08-06 – 2015-08-07 (×2): 100 mg via ORAL
  Filled 2015-08-05 (×2): qty 1

## 2015-08-05 MED ORDER — ACETAMINOPHEN 325 MG PO TABS
650.0000 mg | ORAL_TABLET | ORAL | Status: DC | PRN
  Administered 2015-08-06 (×2): 650 mg via ORAL
  Filled 2015-08-05 (×2): qty 2

## 2015-08-05 MED ORDER — ALBUTEROL SULFATE (2.5 MG/3ML) 0.083% IN NEBU
3.0000 mL | INHALATION_SOLUTION | Freq: Four times a day (QID) | RESPIRATORY_TRACT | Status: DC | PRN
  Administered 2015-08-06 (×2): 3 mL via RESPIRATORY_TRACT
  Filled 2015-08-05 (×2): qty 3

## 2015-08-05 MED ORDER — MAGNESIUM SULFATE 50 % IJ SOLN
2.0000 g/h | INTRAVENOUS | Status: DC
  Administered 2015-08-05 – 2015-08-06 (×2): 2 g/h via INTRAVENOUS
  Filled 2015-08-05 (×2): qty 80

## 2015-08-05 MED ORDER — NIFEDIPINE 10 MG PO CAPS
20.0000 mg | ORAL_CAPSULE | Freq: Once | ORAL | Status: AC
  Administered 2015-08-05: 20 mg via ORAL
  Filled 2015-08-05: qty 2

## 2015-08-05 NOTE — Progress Notes (Signed)
Dr. Ernestina Patches informed of pt condition and complaints.  Pt stating that she started contracting approx 2pm.  Pt denies ROM, VB. Unable to palpate contractions although pt intermittently feeling pain. Abd overdistended due to polyhydramnios per pt and pt H/O Pre E in 2011 with a week long hospital stay after delivery due to CHF. BP slightly elevated and orders received for labs.

## 2015-08-05 NOTE — MAU Provider Note (Signed)
History     CSN: [id number]  Arrival date and time: 08/05/15 1755   First Provider Initiated Contact with Patient 08/05/15 1925      Chief Complaint  Patient presents with  . Contractions   HPI Patient is 24 y.o. [id number] [redacted]w[redacted]d here with complaints of abdominal pain/contractions. 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   She reports abdominal discomfort and contractions. Started at 1400, reports worsening contractions. Did not time ctx at home. Reports this is her only complaint.   Denies nausea, no vomiting, No HA, change in vision/blurry vision, denies increased swelling.    +FM, denies LOF, VB,  vaginal discharge.  Has not received BMZ this pregnancy.   Recent US at 32 wk showed normal AFI  OB History    Gravida Para Term Preterm AB TAB SAB Ectopic Multiple Living   '5 3 2 1 1  1   3      '$ Obstetric Comments   2011 PIH;IOL; PP CHF-HOSPITALIZED X 1 WEEK      Past Medical History  Diagnosis Date  . Eczema   . CHF (congestive heart failure)     RESOLVED; HAD CARDIAC EVAL   . Congestive heart failure 2011    related to preeclampsia  . Hypothyroidism AGE 67     NO TX  . Pregnancy induced hypertension 2011  . Preterm labor 2011  . Infection     UTI OCC  . Infection 2013    [place]  . Asthma 2009; [facility]    INHALER PRN  . Vaginal Pap smear, abnormal     colpo  . Hyperthyroidism     Past Surgical History  Procedure Laterality Date  . Tonsillectomy    . Wisdom tooth extraction    . Tear duct probing    . Cholecystectomy      Family History  Problem Relation Age of Onset  . Anesthesia problems Neg Hx   . Other Neg Hx   . Asthma Brother   . Learning disabilities Brother   . Asthma Daughter   . Learning disabilities Daughter   . Seizures Daughter   . Learning disabilities Other   . Colon cancer Maternal Grandmother   .  Cancer Maternal Grandmother     colon  . Chromosomal disorder Daughter     1q21.1 microdeletion  . Cataracts Daughter     congenital    Social History  Substance Use Topics  . Smoking status: Never Smoker   . Smokeless tobacco: Never Used  . Alcohol Use: No    Allergies: No Known Allergies  Prescriptions prior to admission  Medication Sig Dispense Refill Last Dose  . aspirin 81 MG chewable tablet Chew 1 tablet (81 mg total) by mouth daily. 90 tablet 2 08/05/2015 at Unknown time  . cyclobenzaprine (FLEXERIL) 10 MG tablet Take 1 tablet (10 mg total) by mouth 3 (three) times daily as needed for muscle spasms. 30 tablet 0 08/05/2015 at Unknown time  . ferrous sulfate (FERROUSUL) 325 (65 FE) MG tablet Take 1 tablet (325 mg total) by mouth 2 (two) times daily. (Patient taking differently: Take 325 mg by mouth daily. ) 60 tablet 1 Past Week at Unknown time  . Menthol, Topical Analgesic, (ICY HOT EX) Apply 1 application topically as needed (back ache).   08/05/2015 at Unknown time  . Prenatal Vit-Fe Fumarate-FA (PRENATAL VITAMIN PO) Take 1 tablet by mouth  daily.    08/05/2015 at Unknown time  . albuterol (PROVENTIL HFA;VENTOLIN HFA) 108 (90 BASE) MCG/ACT inhaler Inhale 2 puffs into the lungs every 6 (six) hours as needed for wheezing or shortness of breath.   rescue    Review of Systems  Constitutional: Negative for fever and chills.  Eyes: Negative for blurred vision and double vision.  Respiratory: Negative for cough and shortness of breath.   Cardiovascular: Negative for chest pain and orthopnea.  Gastrointestinal: Negative for nausea and vomiting.  Genitourinary: Negative for dysuria, frequency and flank pain.  Musculoskeletal: Negative for myalgias.  Skin: Negative for rash.  Neurological: Negative for dizziness, weakness and headaches.  Endo/Heme/Allergies: Does not bruise/bleed easily.  Psychiatric/Behavioral: Negative for depression. The patient is not nervous/anxious.    Physical  Exam   Blood pressure 126/93, pulse 108, temperature 98.1 F (36.7 C), temperature source Oral, resp. rate 20, last menstrual period 12/18/2014, unknown if currently breastfeeding.  Physical Exam  Constitutional: She is oriented to person, place, and time. She appears well-developed and well-nourished. No distress.  Pregnant female  HENT:  Head: Normocephalic and atraumatic.  Eyes: Conjunctivae are normal. No scleral icterus.  Neck: Normal range of motion. Neck supple.  Cardiovascular: Normal rate and intact distal pulses.   Respiratory: Effort normal. She exhibits no tenderness.  GI: Soft. There is no tenderness. There is no rebound and no guarding.  Gravid  Genitourinary: Vagina normal.  Musculoskeletal: Normal range of motion. She exhibits no edema.  Neurological: She is alert and oriented to person, place, and time.  Skin: Skin is warm and dry. No rash noted.  Psychiatric: She has a normal mood and affect.    MAU Course  Procedures  MDM CMP CBC- hgb is 9.5 which is below baseline UPC NST155/mod/+accels/ no decels Toco- quiet but patient has palpable ctx every 5-7 minutes, breathing through ctx in room.  Assessment and Plan  Patient is 24 y.o. [id number] [redacted]w[redacted]d here with complaints of contractions  --patient admitted see H&P  Caren Macadam 08/05/2015, 7:47 PM

## 2015-08-05 NOTE — MAU Note (Signed)
Per Pam, RN charge antenatal, pt to go to room 159 for triage.

## 2015-08-05 NOTE — H&P (Signed)
FACULTY PRACTICE ANTEPARTUM ADMISSION HISTORY AND PHYSICAL NOTE  History of Present Illness: Brenda Spencer is a 24 y.o. [phone] at 72w6dadmitted for threatened preterm labor. 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     Patient reports contractions starting at 1400 that have been worsening and increasing in intensity. She has not timed them. She reports sensation of pressure. She has had preterm labor in the past and delivered an infant at 331 weeks She has not received BMZ this pregnancy. Reports contractions are her only complaint.  Denies nausea, no vomiting, No HA, change in vision/blurry vision, denies increased swelling.    Patient reports the fetal movement as active. Patient reports uterine contraction  activity as regular- has not timed them.  Patient reports  vaginal bleeding as none. Patient describes fluid per vagina as None. Fetal presentation is cephalic- by leopolds   Past Medical History  Diagnosis Date  . Eczema   . CHF (congestive heart failure)     RESOLVED; HAD CARDIAC EVAL   . Congestive heart failure 2011    related to preeclampsia  . Hypothyroidism AGE 24    NO TX  . Pregnancy induced hypertension 2011  . Preterm labor 2011  . Infection     [place]  . [place]; [place]    INHALER PRN  . Vaginal Pap smear, abnormal     colpo  . Hyperthyroidism     Past Surgical History  Procedure Laterality Date  . Tonsillectomy    . Wisdom tooth extraction    . Tear duct probing    . Cholecystectomy      OB History  Gravida Para Term Preterm AB SAB TAB Ectopic Multiple Living  _0 # Outcome Date GA Lbr Len/2nd Weight Sex Delivery Anes PTL Lv  5 Current           4 Term 05/22/14 322w0d2:37 / 00:07 7 lb 2.8 oz (3.255 kg) M [place] Court[place]  Y  3 Term 04/29/13 3887w6d:13 /  00:10 6 lb 8.4 oz (2.96 kg) F Vag-Spont EPI  Y  2 SAB 04/2012 10w52w0d     1 Preterm 04/14/10 35w091w0db 14 oz (2.211 kg) F Vag-Spont EPI N Y     Comments: induced PIH, CHF after delivery    Obstetric Comments  2011 PIH;IOL; PP CHF-HOSPITALIZED X 1 WEEK    Social History   Social History  . Marital Status: Married    Spouse Name: CHRISTOPHER  . Number of Children: 1  . Years of Education: 12   Occupational History  . HOMEMAKER    Social History Main Topics  . Smoking status: Never Smoker   . Smokeless tobacco: Never Used  . Alcohol Use: No  . Drug Use: No  . Sexual Activity:    Partners: Male    Birth Control/ Protection: None   Other Topics Concern  . None   Social History Narrative   3-4 cups of tea and soda a day     Family History  Problem Relation Age of Onset  . Anesthesia problems Neg Hx   . Other Neg Hx   . Asthma Brother   . Learning disabilities Brother   . Asthma Daughter   . Learning  disabilities Daughter   . Seizures Daughter   . Learning disabilities Other   . Colon cancer Maternal Grandmother   . Cancer Maternal Grandmother     colon  . Chromosomal disorder Daughter     1q21.1 microdeletion  . Cataracts Daughter     congenital    No Known Allergies  Prescriptions prior to admission  Medication Sig Dispense Refill Last Dose  . albuterol (PROVENTIL HFA;VENTOLIN HFA) 108 (90 BASE) MCG/ACT inhaler Inhale 2 puffs into the lungs every 6 (six) hours as needed for wheezing or shortness of breath.   08/04/2015 at Unknown time  . aspirin 81 MG chewable tablet Chew 1 tablet (81 mg total) by mouth daily. 90 tablet 2 08/05/2015 at Unknown time  . cyclobenzaprine (FLEXERIL) 10 MG tablet Take 1 tablet (10 mg total) by mouth 3 (three) times daily as needed for muscle spasms. 30 tablet 0 Past Week at Unknown time  . ferrous sulfate (FERROUSUL) 325 (65 FE) MG tablet Take 1 tablet (325 mg total) by mouth 2 (two) times daily. (Patient taking differently: Take  325 mg by mouth daily. ) 60 tablet 1 08/05/2015 at Unknown time  . Menthol, Topical Analgesic, (ICY HOT EX) Apply 1 application topically as needed (back ache).   Past Week at Unknown time  . Prenatal Vit-Fe Fumarate-FA (PRENATAL VITAMIN PO) Take 1 tablet by mouth daily.    08/05/2015 at Unknown time    Review of Systems - Negative except contractions, persisent abdominal pain from enlarged abdomen, and intermittent SOB that is stable.   Vitals:  BP 126/93 mmHg  Pulse 108  Temp(Src) 98.1 F (36.7 C) (Oral)  Resp 20  LMP 12/18/2014 Physical Examination: CONSTITUTIONAL: Well-developed, well-nourished female in no acute distress.  HENT:  Normocephalic, atraumatic, External right and left ear normal. Oropharynx is clear and moist EYES: Conjunctivae and EOM are normal. Pupils are equal, round, and reactive to light. No scleral icterus.  NECK: Normal range of motion, supple, no masses SKIN: Skin is warm and dry. No rash noted. Not diaphoretic. No erythema. No pallor. [place]: Alert and oriented to person, place, and time. Normal reflexes, muscle tone coordination. No cranial nerve deficit noted. PSYCHIATRIC: Normal mood and affect. Normal behavior. Normal judgment and thought content. CARDIOVASCULAR: Normal heart rate noted, regular rhythm RESPIRATORY: Effort and breath sounds normal, no problems with respiration noted ABDOMEN: Soft, nontender, nondistended, gravid. MUSCULOSKELETAL: Normal range of motion. No edema and no tenderness. 2+ distal pulses.  Cervix: Evaluated by digital exam. and found to be 2 cm/ 50%/Floating and fetal presentation is cephalic. Membranes:intact Fetal Monitoring:Baseline: 155 bpm/Mod/+accels/no decels Tocometer: Flat but palpates every 5-6 minutes.   Labs:  Results for orders placed or performed during the hospital encounter of 08/05/15 (from the past 24 hour(s))  CBC   Collection Time: 08/05/15  7:15 PM  Result Value Ref Range   WBC 9.2 4.0 - 10.5 K/uL    RBC 3.86 (L) 3.87 - 5.11 MIL/uL   Hemoglobin 9.5 (L) 12.0 - 15.0 g/dL   HCT 30.8 (L) 36.0 - 46.0 %   MCV 79.8 78.0 - 100.0 fL   MCH 24.6 (L) 26.0 - 34.0 pg   MCHC 30.8 30.0 - 36.0 g/dL   RDW 15.4 11.5 - 15.5 %   Platelets 188 150 - 400 K/uL  Comprehensive metabolic panel   Collection Time: 08/05/15  7:15 PM  Result Value Ref Range   Sodium 136 135 - 145 mmol/L   Potassium 3.6 3.5 - 5.1 mmol/L  Chloride 104 101 - 111 mmol/L   CO2 23 22 - 32 mmol/L   Glucose, Bld 82 65 - 99 mg/dL   BUN 6 6 - 20 mg/dL   Creatinine, Ser 0.38 (L) 0.44 - 1.00 mg/dL   Calcium 8.0 (L) 8.9 - 10.3 mg/dL   Total Protein 6.8 6.5 - 8.1 g/dL   Albumin 2.5 (L) 3.5 - 5.0 g/dL   AST 14 (L) 15 - 41 U/L   ALT 10 (L) 14 - 54 U/L   Alkaline Phosphatase 125 38 - 126 U/L   Total Bilirubin 0.4 0.3 - 1.2 mg/dL   GFR calc non Af Amer >60 >60 mL/min   GFR calc Af Amer >60 >60 mL/min   Anion gap 9 5 - 15    Imaging Studies: 07/31/2015 _0 - AFI 19.7 cm, [id number] (43rd%) 07/10/2015 @ 29wk1d- AFI 20.76, EFW- 1278g (44th%) 05/03/2015 @ 19wk3d- Detailed fetal anatomy wnl, AFI nml   Assessment and Plan: TIMESHA CERVANTEZ is a 24 y.o. [phone] at 53w6dwith pregnancy complicated by history of PreX in prior pregnancy with CHF, h/o preterm labor/delivery, history of congenital abnormality in previous child (1q21 deletion), obesity, asthma presenting with onset of contractions today with possible cervical change.   Admit to Antenatal Routine antenatal care  #Threateneded preterm labor:  - will give nifedipine for tocolysis (267mthen 10q6) - GBS culture  - Repeat exam in 4 hours if ctx persist  #FWB: Cat I currently - BMZ 12g every 12 x2 doses for lung maturity. First dose at 2015 - Magnesium 4g bolus with 2g/hr infusion for neuroprotection  #Size greater than dates - recently has MFM scan _1  which showed normal AFI  #History of prior child with 1q21 deletion: declined antenatal testing. [facility].    KiCaren MacadamMD Fellow [facility]

## 2015-08-06 ENCOUNTER — Inpatient Hospital Stay (HOSPITAL_COMMUNITY): Payer: Medicaid Other

## 2015-08-06 DIAGNOSIS — R0789 Other chest pain: Secondary | ICD-10-CM

## 2015-08-06 DIAGNOSIS — Z3A33 33 weeks gestation of pregnancy: Secondary | ICD-10-CM

## 2015-08-06 LAB — URINE MICROSCOPIC-ADD ON

## 2015-08-06 LAB — URINALYSIS, ROUTINE W REFLEX MICROSCOPIC
BILIRUBIN URINE: NEGATIVE
Glucose, UA: NEGATIVE mg/dL
KETONES UR: 15 mg/dL — AB
Leukocytes, UA: NEGATIVE
NITRITE: NEGATIVE
PROTEIN: NEGATIVE mg/dL
UROBILINOGEN UA: 1 mg/dL (ref 0.0–1.0)
pH: 6 (ref 5.0–8.0)

## 2015-08-06 LAB — MAGNESIUM: Magnesium: 4.6 mg/dL — ABNORMAL HIGH (ref 1.7–2.4)

## 2015-08-06 MED ORDER — HYDROMORPHONE HCL 1 MG/ML IJ SOLN
1.0000 mg | INTRAMUSCULAR | Status: DC | PRN
  Administered 2015-08-06 – 2015-08-07 (×2): 1 mg via INTRAVENOUS
  Filled 2015-08-06 (×2): qty 1

## 2015-08-06 MED ORDER — LACTATED RINGERS IV SOLN
INTRAVENOUS | Status: DC
  Administered 2015-08-05 – 2015-08-07 (×4): via INTRAVENOUS

## 2015-08-06 NOTE — Progress Notes (Signed)
Subjective:SOB and chest pain slowly improving Patient reports no contractions, good FM. Received albuterol nebulizer initially no improvement. No cough  Objective: I have reviewed patient's vital signs and medications. Blood pressure 114/68, pulse 109, temperature 98.7 F (37.1 C), temperature source Oral, resp. rate 18, height 5\' 1"  (1.549 m), weight 90.266 kg (199 lb), last menstrual period 12/18/2014, SpO2 96 %, unknown if currently breastfeeding.  General: alert, cooperative and mild distress Resp: clear to auscultation bilaterally   Assessment/Plan: H/o asthma, sx of CP and SOB, will obtain EKG and reevaluate her sx. No elevated BP or suspicion of CHF currently   LOS: 1 day    Suliman Termini 08/06/2015, 9:24 PM

## 2015-08-06 NOTE — Progress Notes (Signed)
Patient ID: Brenda Spencer, female   DOB: March 10, 1991, 24 y.o.   MRN: [id number]  [place] ANTEPARTUM NOTE  Brenda Spencer is a 24 y.o. [id number] at [redacted]w[redacted]d  who is admitted for Preterm labor.   Fetal presentation is cephalic. Length of Stay:  1  Days  Subjective: Patient on magnesium.  Contractions have resolved.  Denies vaginal pressure.   Does have some chest tightness.  Denies wheezing, cough.  Mild SOB. Patient reports good fetal movement.   She reports no uterine contractions She reports no bleeding  She reports no loss of fluid per vagina.  Vitals:  Blood pressure 113/76, pulse 112, temperature 97.9 F (36.6 C), temperature source Oral, resp. rate 16, height 5\' 1"  (1.549 m), weight 199 lb (90.266 kg), last menstrual period 12/18/2014, SpO2 100 %, unknown if currently breastfeeding. Physical Examination:  General appearance - alert, well appearing, and in no distress Chest - clear to auscultation, no wheezes, rales or rhonchi, symmetric air entry Heart - normal rate, regular rhythm, normal S1, S2, no murmurs, rubs, clicks or gallops Abdomen - soft, nontender, nondistended, no masses or organomegaly Fundal Height:  size equals dates Extremities: extremities normal, atraumatic, no cyanosis or edema   Fetal Monitoring:  Baseline: 130 bpm, Variability: Good {> 6 bpm), Accelerations: non-reactive and Decelerations: Absent  Labs:  Results for orders placed or performed during the hospital encounter of 08/05/15 (from the past 24 hour(s))  Protein / creatinine ratio, urine   Collection Time: 08/05/15  6:22 PM  Result Value Ref Range   Creatinine, Urine 107.00 mg/dL   Total Protein, Urine 18 mg/dL   Protein Creatinine Ratio 0.17 (H) 0.00 - 0.15 mg/mg[Cre]  CBC   Collection Time: 08/05/15  7:15 PM  Result Value Ref Range   WBC 9.2 4.0 - 10.5 K/uL   RBC 3.86 (L) 3.87 - 5.11 MIL/uL   Hemoglobin 9.5 (L) 12.0 - 15.0 g/dL   HCT 30.8 (L) 36.0 - 46.0 %   MCV 79.8 78.0 - 100.0  fL   MCH 24.6 (L) 26.0 - 34.0 pg   MCHC 30.8 30.0 - 36.0 g/dL   RDW 15.4 11.5 - 15.5 %   Platelets 188 150 - 400 K/uL  Comprehensive metabolic panel   Collection Time: 08/05/15  7:15 PM  Result Value Ref Range   Sodium 136 135 - 145 mmol/L   Potassium 3.6 3.5 - 5.1 mmol/L   Chloride 104 101 - 111 mmol/L   CO2 23 22 - 32 mmol/L   Glucose, Bld 82 65 - 99 mg/dL   BUN 6 6 - 20 mg/dL   Creatinine, Ser 0.38 (L) 0.44 - 1.00 mg/dL   Calcium 8.0 (L) 8.9 - 10.3 mg/dL   Total Protein 6.8 6.5 - 8.1 g/dL   Albumin 2.5 (L) 3.5 - 5.0 g/dL   AST 14 (L) 15 - 41 U/L   ALT 10 (L) 14 - 54 U/L   Alkaline Phosphatase 125 38 - 126 U/L   Total Bilirubin 0.4 0.3 - 1.2 mg/dL   GFR calc non Af Amer >60 >60 mL/min   GFR calc Af Amer >60 >60 mL/min   Anion gap 9 5 - 15  Type and screen   Collection Time: 08/05/15 10:25 PM  Result Value Ref Range   ABO/RH(D) A POS    Antibody Screen NEG    Sample Expiration 08/08/2015     Imaging Studies:    none   Medications:  Scheduled . betamethasone acetate-betamethasone sodium phosphate  12 mg Intramuscular Q24 Hr x 2  . docusate sodium  100 mg Oral Daily  . ferrous sulfate  325 mg Oral BID WC  . NIFEdipine  10 mg Oral 4 times per day  . prenatal multivitamin  1 tablet Oral Q1200   I have reviewed the patient's current medications.  ASSESSMENT: Patient Active Problem List   Diagnosis Date Noted  . Preterm labor 08/05/2015  . Anemia affecting pregnancy 07/16/2015  . Uterine size-date discrepancy in third trimester, antepartum 07/09/2015  . Group B streptococcal infection during pregnancy 07/02/2015  . Hereditary disease in family possibly affecting fetus   . Obesity affecting pregnancy in second trimester, antepartum   . Chromosome 1 deletion syndrome   . Hx of preeclampsia, prior pregnancy, currently pregnant   . Parent of child with chromosome abnormality   . Encounter for supervision of other normal pregnancy in first trimester 02/28/2015  .  History of pregnancy induced hypertension 02/28/2015  . Second hand tobacco smoke exposure 03/09/2013  . S/P laparoscopic cholecystectomy 02/06/2013  . History of congestive heart failure 01/09/2013  . Mild persistent asthma 11/18/2012    PLAN: 1.  Preterm Labor  Continue Magnesium - no contractions  Betamethasone second dose later today  Continuous monitoring for now 2. Chest tightness  May be from Magnesium - check magnesium level  Will give breathing treatment to see if improves.  If not, will check CXR as has history of CHF 3.  IUP at 33 weeks  Non-reactive NST, but on magnesium.  Continue to monitor. Continue routine antenatal care.   Truett Mainland, DO 08/06/2015,6:12 AM

## 2015-08-06 NOTE — Progress Notes (Signed)
Notified Dr. Roselie Awkward of pt c/o continuous chest pain & SOB even after breathing treatment. Per  MD will come evaluate.

## 2015-08-06 NOTE — Progress Notes (Signed)
Dr. Nehemiah Settle made aware of patient C/O chest pain when taking deep breath. O2 SAT 99% on RA, Lungs clear, 2L O2 placed per [place].

## 2015-08-06 NOTE — Plan of Care (Signed)
Problem: Consults Goal: Birthing Suites Patient Information Press F2 to bring up selections list  Outcome: Not Applicable Date Met:  42/37/02  Patient admitted to antenatal

## 2015-08-07 LAB — CULTURE, OB URINE: Culture: 6000

## 2015-08-07 MED ORDER — OXYCODONE-ACETAMINOPHEN 5-325 MG PO TABS
1.0000 | ORAL_TABLET | ORAL | Status: DC | PRN
  Administered 2015-08-07 (×2): 1 via ORAL
  Filled 2015-08-07 (×2): qty 1

## 2015-08-07 NOTE — Discharge Summary (Signed)
Physician Discharge Summary  Patient ID: Brenda Spencer MRN: [id number] DOB/AGE: 1991-01-05 24 y.o.  Admit date: 08/05/2015 Discharge date: 08/07/2015  Admission Diagnoses: preterm labor  Discharge Diagnoses:  Principal Problem:   Preterm labor Active Problems:   Chest tightness   Discharged Condition: good  Hospital Course: Patient admitted secondary to preterm labor. She received betamethasone and magnesium sulfate for tocolysis. The magnesium sulfate was stopped and the patient remained stable without contraction or pelvic pressure. Discharge instructions were provided and the patient is scheuled to follow up in one week for routine prenatal care. Repeat cervical exam remained unchanged from admission 2/thick/high  Consults: None  Treatments: betamethasone and tocolysis  Discharge Exam: Blood pressure 126/71, pulse 105, temperature 98.4 F (36.9 C), temperature source Oral, resp. rate 18, height 5\' 1"  (1.549 m), weight 199 lb (90.266 kg), last menstrual period 12/18/2014, SpO2 96 %, unknown if currently breastfeeding. General appearance: alert, cooperative and no distress Resp: clear to auscultation bilaterally Cardio: regular rate and rhythm GI: soft, gravid, NT Extremities: Homans sign is negative, no sign of DVT and no edema, redness or tenderness in the calves or thighs cervix 2/thick/high  Disposition: 01-Home or Self Care     Medication List    TAKE these medications        albuterol 108 (90 BASE) MCG/ACT inhaler  Commonly known as:  PROVENTIL HFA;VENTOLIN HFA  Inhale 2 puffs into the lungs every 6 (six) hours as needed for wheezing or shortness of breath.     aspirin 81 MG chewable tablet  Chew 1 tablet (81 mg total) by mouth daily.     cyclobenzaprine 10 MG tablet  Commonly known as:  FLEXERIL  Take 1 tablet (10 mg total) by mouth 3 (three) times daily as needed for muscle spasms.     ferrous sulfate 325 (65 FE) MG tablet  Commonly known as:   FERROUSUL  Take 1 tablet (325 mg total) by mouth 2 (two) times daily.     ICY HOT EX  Apply 1 application topically as needed (back ache).     PRENATAL VITAMIN PO  Take 1 tablet by mouth daily.           Follow-up Information    Follow up with [facility] On 08/14/2015.   Specialty:  Obstetrics and Gynecology   Why:  at 9 am as previously scheduled   Contact information:   [address] [place] [phone]      Signed: Mora Bellman 08/07/2015, 1:43 PM

## 2015-08-07 NOTE — Progress Notes (Signed)
Pt requesting toco off, MD o.k with pt having toco off. Will monitor prn contractions'

## 2015-08-07 NOTE — Progress Notes (Signed)
FACULTY PRACTICE ANTEPARTUM(COMPREHENSIVE) NOTE  Brenda Spencer is a 24 y.o. [phone] at [redacted]w[redacted]d by LMP who is admitted for Preterm labor.    Length of Stay:  2  Days  Subjective: No contraction, CP and SOB resolved Patient reports the fetal movement as active. Patient reports uterine contraction  activity as none. Patient reports  vaginal bleeding as none. Patient describes fluid per vagina as None.  Vitals:  Blood pressure 126/78, pulse 120, temperature 98.7 F (37.1 C), temperature source Oral, resp. rate 20, height 5\' 1"  (1.549 m), weight 90.266 kg (199 lb), last menstrual period 12/18/2014, SpO2 97 %, unknown if currently breastfeeding. Physical Examination:  General appearance - alert, well appearing, and in no distress Heart - normal rate and regular rhythm Abdomen - soft, nontender, nondistended Fundal Height:  size equals dates Cervical Exam: Not evaluated.  Extremities: extremities normal, atraumatic, no cyanosis or edema and Homans sign is negative, no sign of DVT with DTRs 2+ bilaterally Membranes:intact  Fetal Monitoring:  Fetal Heart Rate A      Mode  External filed at 08/06/2015 2206    Baseline Rate (A)  140 bpm filed at 08/06/2015 2206    Variability  6-25 BPM filed at 08/06/2015 2206    Accelerations  15 x 15 filed at 08/06/2015 2206    Decelerations  None filed at 08/06/2015 2206        Labs:  Results for orders placed or performed during the hospital encounter of 08/05/15 (from the past 24 hour(s))  Urinalysis, Routine w reflex microscopic (not at [facility])   Collection Time: 08/06/15  2:45 PM  Result Value Ref Range   Color, Urine YELLOW YELLOW   APPearance CLEAR CLEAR   Specific Gravity, Urine >1.030 (H) 1.005 - 1.030   pH 6.0 5.0 - 8.0   Glucose, UA NEGATIVE NEGATIVE mg/dL   Hgb urine dipstick TRACE (A) NEGATIVE   Bilirubin Urine NEGATIVE NEGATIVE   Ketones, ur 15 (A) NEGATIVE mg/dL   Protein, ur NEGATIVE NEGATIVE mg/dL   Urobilinogen, UA  1.0 0.0 - 1.0 mg/dL   Nitrite NEGATIVE NEGATIVE   Leukocytes, UA NEGATIVE NEGATIVE  Urine microscopic-add on   Collection Time: 08/06/15  2:45 PM  Result Value Ref Range   Squamous Epithelial / LPF RARE RARE   WBC, UA 3-6 <3 WBC/hpf   RBC / HPF 0-2 <3 RBC/hpf   Bacteria, UA FEW (A) RARE   Urine-Other MUCOUS PRESENT       Medications:  Scheduled . docusate sodium  100 mg Oral Daily  . ferrous sulfate  325 mg Oral BID WC  . prenatal multivitamin  1 tablet Oral Q1200   I have reviewed the patient's current medications.  ASSESSMENT: Patient Active Problem List   Diagnosis Date Noted  . Chest tightness 08/06/2015  . Preterm labor 08/05/2015  . Anemia affecting pregnancy 07/16/2015  . Uterine size-date discrepancy in third trimester, antepartum 07/09/2015  . Group B streptococcal infection during pregnancy 07/02/2015  . Hereditary disease in family possibly affecting fetus   . Obesity affecting pregnancy in second trimester, antepartum   . Chromosome 1 deletion syndrome   . Hx of preeclampsia, prior pregnancy, currently pregnant   . Parent of child with chromosome abnormality   . Encounter for supervision of other normal pregnancy in first trimester 02/28/2015  . History of pregnancy induced hypertension 02/28/2015  . Second hand tobacco smoke exposure 03/09/2013  . S/P laparoscopic cholecystectomy 02/06/2013  . History of congestive heart failure 01/09/2013  .  Mild persistent asthma 11/18/2012    PLAN: D/C magnesium sulfate and observe for s/sx of PTL  Abdullahi Vallone 08/07/2015,7:24 AM

## 2015-08-07 NOTE — Discharge Instructions (Signed)
Preterm Labor Information Preterm labor is when labor starts before you are [redacted] weeks pregnant. The normal length of pregnancy is 39 to 41 weeks.  CAUSES  The cause of preterm labor is not often known. The most common known cause is infection. RISK FACTORS  Having a history of preterm labor.  Having your water break before it should.  Having a placenta that covers the opening of the cervix.  Having a placenta that breaks away from the uterus.  Having a cervix that is too weak to hold the baby in the uterus.  Having too much fluid in the amniotic sac.  Taking drugs or smoking while pregnant.  Not gaining enough weight while pregnant.  Being younger than 60 and older than 25 years old.  Having a low income.  Being African American. SYMPTOMS  Period-like cramps, belly (abdominal) pain, or back pain.  Contractions that are regular, as often as six in an hour. They may be mild or painful.  Contractions that start at the top of the belly. They then move to the lower belly and back.  Lower belly pressure that seems to get stronger.  Bleeding from the vagina.  Fluid leaking from the vagina. TREATMENT  Treatment depends on:  Your condition.  The condition of your baby.  How many weeks pregnant you are. Your doctor may have you:  Take medicine to stop contractions.  Stay in bed except to use the restroom (bed rest).  Stay in the hospital. WHAT SHOULD YOU DO IF YOU THINK YOU ARE IN PRETERM LABOR? Call your doctor right away. You need to go to the hospital right away.  HOW CAN YOU PREVENT PRETERM LABOR IN FUTURE PREGNANCIES?  Stop smoking, if you smoke.  Maintain healthy weight gain.  Do not take drugs or be around chemicals that are not needed.  Tell your doctor if you think you have an infection.  Tell your doctor if you had a preterm labor before. Document Released: 02/26/2009 Document Revised: 09/20/2013 Document Reviewed: 02/26/2009 [facility] Patient  Information 2015 [place], [place]. This information is not intended to replace advice given to you by your health care provider. Make sure you discuss any questions you have with your health care provider.

## 2015-08-07 NOTE — Progress Notes (Signed)
MD made aware that reflexes 3+ and clonus noted on pt. No h/a or visual changes

## 2015-08-08 ENCOUNTER — Telehealth: Payer: Self-pay | Admitting: *Deleted

## 2015-08-08 ENCOUNTER — Encounter (HOSPITAL_COMMUNITY): Payer: Self-pay | Admitting: *Deleted

## 2015-08-08 ENCOUNTER — Inpatient Hospital Stay (HOSPITAL_COMMUNITY)
Admission: AD | Admit: 2015-08-08 | Discharge: 2015-08-08 | Disposition: A | Discharge status: Home / Self Care | Payer: Medicaid Other | Source: Ambulatory Visit | Attending: Family Medicine | Admitting: Family Medicine

## 2015-08-08 DIAGNOSIS — O4703 False labor before 37 completed weeks of gestation, third trimester: Secondary | ICD-10-CM

## 2015-08-08 DIAGNOSIS — Z7982 Long term (current) use of aspirin: Secondary | ICD-10-CM | POA: Insufficient documentation

## 2015-08-08 DIAGNOSIS — R109 Unspecified abdominal pain: Secondary | ICD-10-CM

## 2015-08-08 DIAGNOSIS — N898 Other specified noninflammatory disorders of vagina: Secondary | ICD-10-CM | POA: Insufficient documentation

## 2015-08-08 DIAGNOSIS — O9989 Other specified diseases and conditions complicating pregnancy, childbirth and the puerperium: Secondary | ICD-10-CM | POA: Insufficient documentation

## 2015-08-08 DIAGNOSIS — Z3A33 33 weeks gestation of pregnancy: Secondary | ICD-10-CM | POA: Insufficient documentation

## 2015-08-08 DIAGNOSIS — O26899 Other specified pregnancy related conditions, unspecified trimester: Secondary | ICD-10-CM

## 2015-08-08 LAB — URINALYSIS, ROUTINE W REFLEX MICROSCOPIC
BILIRUBIN URINE: NEGATIVE
GLUCOSE, UA: NEGATIVE mg/dL
HGB URINE DIPSTICK: NEGATIVE
Ketones, ur: 15 mg/dL — AB
Leukocytes, UA: NEGATIVE
Nitrite: NEGATIVE
PH: 6.5 (ref 5.0–8.0)
Protein, ur: NEGATIVE mg/dL
SPECIFIC GRAVITY, URINE: 1.02 (ref 1.005–1.030)
Urobilinogen, UA: 4 mg/dL — ABNORMAL HIGH (ref 0.0–1.0)

## 2015-08-08 LAB — POCT FERN TEST: POCT Fern Test: NEGATIVE

## 2015-08-08 LAB — AMNISURE RUPTURE OF MEMBRANE (ROM) NOT AT ARMC: Amnisure ROM: NEGATIVE

## 2015-08-08 NOTE — Telephone Encounter (Signed)
Pt called and stated that she is having braxton hicks contractions. She is also having some pressure. Patient reports some wetness but isn't sure if it is sweat or her water has broken. I advised patient to stay hydrated and rest to help with contractions. Also recommended that she put on dry clothes and see if the underwear become wet again, if so she should come to MAU. Patient had no further questions.

## 2015-08-08 NOTE — MAU Provider Note (Signed)
History   [id number]   Chief Complaint  Patient presents with  . Vaginal Discharge    HPI Brenda Spencer is a 24 y.o. female  [phone] here with report of vaginal discharge at approximately 1330.  Leaking of fluid has continued.  Pt reports occasional contractions and denies vaginal bleeding.  Last intercourse 4 days ago.   +fetal movement.   All other systems negative.    Patient's last menstrual period was 12/18/2014.  OB History  Gravida Para Term Preterm AB SAB TAB Ectopic Multiple Living  $Remov'5 3 2 1 1 1    3    'MFYeXc$ # Outcome Date GA Lbr Len/2nd Weight Sex Delivery Anes PTL Lv  5 Current           4 Term 05/22/14 [redacted]w[redacted]d 02:37 / 00:07 3.255 kg (7 lb 2.8 oz) M Vag-Spont EPI  Y  3 Term 04/29/13 [redacted]w[redacted]d 10:13 / 00:10 2.96 kg (6 lb 8.4 oz) F Vag-Spont EPI  Y  2 SAB 04/2012 [redacted]w[redacted]d         1 Preterm 04/14/10 [redacted]w[redacted]d  2.211 kg (4 lb 14 oz) F Vag-Spont EPI N Y     Comments: induced PIH, CHF after delivery    Obstetric Comments  2011 PIH;IOL; PP CHF-HOSPITALIZED X 1 WEEK    Past Medical History  Diagnosis Date  . Eczema   . CHF (congestive heart failure)     RESOLVED; HAD CARDIAC EVAL   . Congestive heart failure 2011    related to preeclampsia  . Hypothyroidism AGE 74     NO TX  . Pregnancy induced hypertension 2011  . Preterm labor 2011  . Infection     UTI OCC  . Infection 2013    [place]  . Asthma 2009; [facility]    INHALER PRN  . Vaginal Pap smear, abnormal     colpo  . Hyperthyroidism     Family History  Problem Relation Age of Onset  . Anesthesia problems Neg Hx   . Other Neg Hx   . Asthma Brother   . Learning disabilities Brother   . Asthma Daughter   . Learning disabilities Daughter   . Seizures Daughter   . Learning disabilities Other   . Colon cancer Maternal Grandmother   . Cancer Maternal Grandmother     colon  . Chromosomal disorder Daughter     1q21.1 microdeletion  . Cataracts Daughter     congenital    Social History   Social History  .  Marital Status: Married    Spouse Name: CHRISTOPHER  . Number of Children: 1  . Years of Education: 12   Occupational History  . HOMEMAKER    Social History Main Topics  . Smoking status: Never Smoker   . Smokeless tobacco: Never Used  . Alcohol Use: No  . Drug Use: No  . Sexual Activity:    Partners: Male    Birth Control/ Protection: None   Other Topics Concern  . None   Social History Narrative   3-4 cups of tea and soda a day     No Known Allergies  No current facility-administered medications on file prior to encounter.   Current Outpatient Prescriptions on File Prior to Encounter  Medication Sig Dispense Refill  . albuterol (PROVENTIL HFA;VENTOLIN HFA) 108 (90 BASE) MCG/ACT inhaler Inhale 2 puffs into the lungs every 6 (six) hours as needed for wheezing or shortness of breath.    Marland Kitchen aspirin 81 MG chewable tablet Chew 1  tablet (81 mg total) by mouth daily. 90 tablet 2  . cyclobenzaprine (FLEXERIL) 10 MG tablet Take 1 tablet (10 mg total) by mouth 3 (three) times daily as needed for muscle spasms. 30 tablet 0  . ferrous sulfate (FERROUSUL) 325 (65 FE) MG tablet Take 1 tablet (325 mg total) by mouth 2 (two) times daily. (Patient taking differently: Take 325 mg by mouth daily. ) 60 tablet 1  . Menthol, Topical Analgesic, (ICY HOT EX) Apply 1 application topically as needed (back ache).    . Prenatal Vit-Fe Fumarate-FA (PRENATAL VITAMIN PO) Take 1 tablet by mouth daily.        Review of Systems  Constitutional: Negative for fever.  Gastrointestinal: Positive for abdominal pain (contractions). Negative for vomiting, diarrhea and constipation.  Genitourinary: Positive for vaginal discharge. Negative for dysuria and urgency.  All other systems reviewed and are negative.    Physical Exam   Filed Vitals:   08/08/15 1954  BP: 126/77  Pulse: 113  Temp: 98.4 F (36.9 C)  TempSrc: Oral  Resp: 19  Height: $Remove'5\' 1"'EcNBkyV$  (1.549 m)  Weight: 91.173 kg (201 lb)  SpO2: 100%     Physical Exam  Constitutional: She is oriented to person, place, and time. She appears well-developed and well-nourished. No distress.  HENT:  Head: Normocephalic.  Neck: Normal range of motion. Neck supple.  Cardiovascular: Normal rate, regular rhythm and normal heart sounds.   Respiratory: Effort normal and breath sounds normal.  GI: Soft. There is no tenderness.  Genitourinary: No bleeding in the vagina. Vaginal discharge ( white, not watery) found.  Negative pooling, negative ferning  Neurological: She is alert and oriented to person, place, and time.  Skin: Skin is warm and dry.   Dilation: 1.5 Effacement (%): 20 Cervical Position: Posterior   Results for orders placed or performed during the hospital encounter of 08/08/15 (from the past 24 hour(s))  Urinalysis, Routine w reflex microscopic (not at [facility])     Status: Abnormal   Collection Time: 08/08/15  7:55 PM  Result Value Ref Range   Color, Urine YELLOW YELLOW   APPearance CLEAR CLEAR   Specific Gravity, Urine 1.020 1.005 - 1.030   pH 6.5 5.0 - 8.0   Glucose, UA NEGATIVE NEGATIVE mg/dL   Hgb urine dipstick NEGATIVE NEGATIVE   Bilirubin Urine NEGATIVE NEGATIVE   Ketones, ur 15 (A) NEGATIVE mg/dL   Protein, ur NEGATIVE NEGATIVE mg/dL   Urobilinogen, UA 4.0 (H) 0.0 - 1.0 mg/dL   Nitrite NEGATIVE NEGATIVE   Leukocytes, UA NEGATIVE NEGATIVE  Amnisure rupture of membrane (rom)not at [facility]     Status: None   Collection Time: 08/08/15  9:10 PM  Result Value Ref Range   Amnisure ROM NEGATIVE     MAU Course  Procedures  Amnisure ordered  2100 Report given to H. Norman Herrlich who assumes care of patient. Venia Carbon Michiel Cowboy, CNM   Assessment and Plan

## 2015-08-08 NOTE — Discharge Instructions (Signed)
Braxton Hicks Contractions °Contractions of the uterus can occur throughout pregnancy. Contractions are not always a sign that you are in labor.  °WHAT ARE BRAXTON HICKS CONTRACTIONS?  °Contractions that occur before labor are called Braxton Hicks contractions, or false labor. Toward the end of pregnancy (32-34 weeks), these contractions can develop more often and may become more forceful. This is not true labor because these contractions do not result in opening (dilatation) and thinning of the cervix. They are sometimes difficult to tell apart from true labor because these contractions can be forceful and people have different pain tolerances. You should not feel embarrassed if you go to the hospital with false labor. Sometimes, the only way to tell if you are in true labor is for your health care provider to look for changes in the cervix. °If there are no prenatal problems or other health problems associated with the pregnancy, it is completely safe to be sent home with false labor and await the onset of true labor. °HOW CAN YOU TELL THE DIFFERENCE BETWEEN TRUE AND FALSE LABOR? °False Labor °· The contractions of false labor are usually shorter and not as hard as those of true labor.   °· The contractions are usually irregular.   °· The contractions are often felt in the front of the lower abdomen and in the groin.   °· The contractions may go away when you walk around or change positions while lying down.   °· The contractions get weaker and are shorter lasting as time goes on.   °· The contractions do not usually become progressively stronger, regular, and closer together as with true labor.   °True Labor °· Contractions in true labor last 30-70 seconds, become very regular, usually become more intense, and increase in frequency.   °· The contractions do not go away with walking.   °· The discomfort is usually felt in the top of the uterus and spreads to the lower abdomen and low back.   °· True labor can be  determined by your health care provider with an exam. This will show that the cervix is dilating and getting thinner.   °WHAT TO REMEMBER °· Keep up with your usual exercises and follow other instructions given by your health care provider.   °· Take medicines as directed by your health care provider.   °· Keep your regular prenatal appointments.   °· Eat and drink lightly if you think you are going into labor.   °· If Braxton Hicks contractions are making you uncomfortable:   °¨ Change your position from lying down or resting to walking, or from walking to resting.   °¨ Sit and rest in a tub of warm water.   °¨ Drink 2-3 glasses of water. Dehydration may cause these contractions.   °¨ Do slow and deep breathing several times an hour.   °WHEN SHOULD I SEEK IMMEDIATE MEDICAL CARE? °Seek immediate medical care if: °· Your contractions become stronger, more regular, and closer together.   °· You have fluid leaking or gushing from your vagina.   °· You have a fever.   °· You pass blood-tinged mucus.   °· You have vaginal bleeding.   °· You have continuous abdominal pain.   °· You have low back pain that you never had before.   °· You feel your baby's head pushing down and causing pelvic pressure.   °· Your baby is not moving as much as it used to.   °Document Released: 11/30/2005 Document Revised: 12/05/2013 Document Reviewed: 09/11/2013 °[employer]® Patient Information ©2015 [employer]. This information is not intended to replace advice given to you by your health care   provider. Make sure you discuss any questions you have with your health care provider. ° °

## 2015-08-08 NOTE — MAU Note (Signed)
Pt reports she thinks she may have been leaking fluid since 1pm and has been having contractions.

## 2015-08-11 ENCOUNTER — Inpatient Hospital Stay (HOSPITAL_COMMUNITY)
Admission: AD | Admit: 2015-08-11 | Discharge: 2015-08-11 | Disposition: A | Discharge status: Home / Self Care | Payer: Medicaid Other | Source: Ambulatory Visit | Attending: Obstetrics and Gynecology | Admitting: Obstetrics and Gynecology

## 2015-08-11 ENCOUNTER — Encounter (HOSPITAL_COMMUNITY): Payer: Self-pay | Admitting: *Deleted

## 2015-08-11 DIAGNOSIS — O9989 Other specified diseases and conditions complicating pregnancy, childbirth and the puerperium: Secondary | ICD-10-CM | POA: Diagnosis not present

## 2015-08-11 DIAGNOSIS — J45909 Unspecified asthma, uncomplicated: Secondary | ICD-10-CM | POA: Insufficient documentation

## 2015-08-11 DIAGNOSIS — Z3A33 33 weeks gestation of pregnancy: Secondary | ICD-10-CM | POA: Insufficient documentation

## 2015-08-11 DIAGNOSIS — O26843 Uterine size-date discrepancy, third trimester: Secondary | ICD-10-CM

## 2015-08-11 DIAGNOSIS — O1490 Unspecified pre-eclampsia, unspecified trimester: Secondary | ICD-10-CM | POA: Diagnosis not present

## 2015-08-11 DIAGNOSIS — I509 Heart failure, unspecified: Secondary | ICD-10-CM | POA: Diagnosis not present

## 2015-08-11 DIAGNOSIS — R109 Unspecified abdominal pain: Secondary | ICD-10-CM | POA: Diagnosis not present

## 2015-08-11 DIAGNOSIS — B951 Streptococcus, group B, as the cause of diseases classified elsewhere: Secondary | ICD-10-CM

## 2015-08-11 DIAGNOSIS — O98819 Other maternal infectious and parasitic diseases complicating pregnancy, unspecified trimester: Secondary | ICD-10-CM

## 2015-08-11 LAB — URINE MICROSCOPIC-ADD ON

## 2015-08-11 LAB — URINALYSIS, ROUTINE W REFLEX MICROSCOPIC
Bilirubin Urine: NEGATIVE
Glucose, UA: NEGATIVE mg/dL
Hgb urine dipstick: NEGATIVE
KETONES UR: NEGATIVE mg/dL
NITRITE: NEGATIVE
PH: 6.5 (ref 5.0–8.0)
Protein, ur: NEGATIVE mg/dL
SPECIFIC GRAVITY, URINE: 1.015 (ref 1.005–1.030)
UROBILINOGEN UA: 1 mg/dL (ref 0.0–1.0)

## 2015-08-11 LAB — FETAL FIBRONECTIN: FETAL FIBRONECTIN: NEGATIVE

## 2015-08-11 MED ORDER — CYCLOBENZAPRINE HCL 10 MG PO TABS
10.0000 mg | ORAL_TABLET | Freq: Once | ORAL | Status: AC
  Administered 2015-08-11: 10 mg via ORAL
  Filled 2015-08-11: qty 1

## 2015-08-11 MED ORDER — NALBUPHINE HCL 10 MG/ML IJ SOLN
10.0000 mg | Freq: Once | INTRAMUSCULAR | Status: AC
  Administered 2015-08-11: 10 mg via INTRAMUSCULAR
  Filled 2015-08-11: qty 1

## 2015-08-11 MED ORDER — CYCLOBENZAPRINE HCL 10 MG PO TABS: 10.0000 mg | ORAL_TABLET | Freq: Three times a day (TID) | ORAL | Status: DC | PRN

## 2015-08-11 NOTE — Discharge Instructions (Signed)
Abdominal Pain During Pregnancy Abdominal pain is common in pregnancy. Most of the time, it does not cause harm. There are many causes of abdominal pain. Some causes are more serious than others. Some of the causes of abdominal pain in pregnancy are easily diagnosed. Occasionally, the diagnosis takes time to understand. Other times, the cause is not determined. Abdominal pain can be a sign that something is very wrong with the pregnancy, or the pain may have nothing to do with the pregnancy at all. For this reason, always tell your health care provider if you have any abdominal discomfort. HOME CARE INSTRUCTIONS  Monitor your abdominal pain for any changes. The following actions may help to alleviate any discomfort you are experiencing:  Do not have sexual intercourse or put anything in your vagina until your symptoms go away completely.  Get plenty of rest until your pain improves.  Drink clear fluids if you feel nauseous. Avoid solid food as long as you are uncomfortable or nauseous.  Only take over-the-counter or prescription medicine as directed by your health care provider.  Keep all follow-up appointments with your health care provider. SEEK IMMEDIATE MEDICAL CARE IF:  You are bleeding, leaking fluid, or passing tissue from the vagina.  You have increasing pain or cramping.  You have persistent vomiting.  You have painful or bloody urination.  You have a fever.  You notice a decrease in your baby's movements.  You have extreme weakness or feel faint.  You have shortness of breath, with or without abdominal pain.  You develop a severe headache with abdominal pain.  You have abnormal vaginal discharge with abdominal pain.  You have persistent diarrhea.  You have abdominal pain that continues even after rest, or gets worse. MAKE SURE YOU:   Understand these instructions.  Will watch your condition.  Will get help right away if you are not doing well or get  worse. Document Released: 11/30/2005 Document Revised: 09/20/2013 Document Reviewed: 06/29/2013 [facility] Patient Information 2015 [place], [place]. This information is not intended to replace advice given to you by your health care provider. Make sure you discuss any questions you have with your health care provider.    Preterm Labor Information Preterm labor is when labor starts at less than 37 weeks of pregnancy. The normal length of a pregnancy is 39 to 41 weeks. CAUSES Often, there is no identifiable underlying cause as to why a woman goes into preterm labor. One of the most common known causes of preterm labor is infection. Infections of the uterus, cervix, vagina, amniotic sac, bladder, kidney, or even the lungs (pneumonia) can cause labor to start. Other suspected causes of preterm labor include:  Urogenital infections, such as yeast infections and bacterial vaginosis.  Uterine abnormalities (uterine shape, uterine septum, fibroids, or bleeding from the placenta).  A cervix that has been operated on (it may fail to stay closed).  Malformations in the fetus.  Multiple gestations (twins, triplets, and so on).  Breakage of the amniotic sac.  RISK FACTORS Having a previous history of preterm labor.  Having premature rupture of membranes (PROM).  Having a placenta that covers the opening of the cervix (placenta previa).  Having a placenta that separates from the uterus (placental abruption).  Having a cervix that is too weak to hold the fetus in the uterus (incompetent cervix).  Having too much fluid in the amniotic sac (polyhydramnios).  Taking illegal drugs or smoking while pregnant.  Not gaining enough weight while pregnant.  Being younger  than 61 and older than 26 years old.  Having a low socioeconomic status.  Being African American. SYMPTOMS Signs and symptoms of preterm labor include:  Menstrual-like cramps, abdominal pain, or back pain. Uterine contractions  that are regular, as frequent as six in an hour, regardless of their intensity (may be mild or painful). Contractions that start on the top of the uterus and spread down to the lower abdomen and back.  A sense of increased pelvic pressure.  A watery or bloody mucus discharge that comes from the vagina.  TREATMENT Depending on the length of the pregnancy and other circumstances, your health care provider may suggest bed rest. If necessary, there are medicines that can be given to stop contractions and to mature the fetal lungs. If labor happens before 34 weeks of pregnancy, a prolonged hospital stay may be recommended. Treatment depends on the condition of both you and the fetus.  WHAT SHOULD YOU DO IF YOU THINK YOU ARE IN PRETERM LABOR? Call your health care provider right away. You will need to go to the hospital to get checked immediately. HOW CAN YOU PREVENT PRETERM LABOR IN FUTURE PREGNANCIES? You should:  Stop smoking if you smoke. Maintain healthy weight gain and avoid chemicals and drugs that are not necessary. Be watchful for any type of infection. Inform your health care provider if you have a known history of preterm labor. Document Released: 02/20/2004 Document Revised: 08/02/2013 Document Reviewed: 01/02/2013 [facility] Patient Information 2015 [place], [place]. This information is not intended to replace advice given to you by your health care provider. Make sure you discuss any questions you have with your health care provider.

## 2015-08-11 NOTE — MAU Provider Note (Signed)
History     CSN: [id number]  Arrival date and time: 08/11/15 0017   First Provider Initiated Contact with Patient 08/11/15 [phone]     Chief Complaint  Patient presents with  . Contractions   HPI Patient is 24 y.o. [id number] [redacted]w[redacted]d with pregnancy complicated by h/o preeclampsia with CHF, hypothyroidism, history of preterm delivery here with complaints of contractions that started at 1500 on 8/27. She has not timed them. They have become more intense. She was recently admitted for threatened PTL and was given BMZ on 8/22 and 8/23.  Last sexual activity Monday, last exam was Thursday   +FM, denies LOF, VB, vaginal discharge.   OB History    Gravida Para Term Preterm AB TAB SAB Ectopic Multiple Living   '5 3 2 1 1  1   3      '$ Obstetric Comments   2011 PIH;IOL; PP CHF-HOSPITALIZED X 1 WEEK      Past Medical History  Diagnosis Date  . Eczema   . CHF (congestive heart failure)     RESOLVED; HAD CARDIAC EVAL   . Congestive heart failure 2011    related to preeclampsia  . Hypothyroidism AGE 72     NO TX  . Pregnancy induced hypertension 2011  . Preterm labor 2011  . Infection     UTI OCC  . Infection 2013    [place]  . Asthma 2009; [facility]    INHALER PRN  . Vaginal Pap smear, abnormal     colpo  . Hyperthyroidism     Past Surgical History  Procedure Laterality Date  . Tonsillectomy    . Wisdom tooth extraction    . Tear duct probing    . Cholecystectomy      Family History  Problem Relation Age of Onset  . Anesthesia problems Neg Hx   . Other Neg Hx   . Asthma Brother   . Learning disabilities Brother   . Asthma Daughter   . Learning disabilities Daughter   . Seizures Daughter   . Learning disabilities Other   . Colon cancer Maternal Grandmother   . Cancer Maternal Grandmother     colon  . Chromosomal disorder Daughter     1q21.1 microdeletion  . Cataracts Daughter     congenital    Social History  Substance Use Topics  . Smoking status: Never Smoker    . Smokeless tobacco: Never Used  . Alcohol Use: No    Allergies: No Known Allergies  Prescriptions prior to admission  Medication Sig Dispense Refill Last Dose  . aspirin 81 MG chewable tablet Chew 1 tablet (81 mg total) by mouth daily. 90 tablet 2 08/11/2015 at Unknown time  . Menthol, Topical Analgesic, (ICY HOT EX) Apply 1 application topically as needed (back ache).   08/11/2015 at Unknown time  . Prenatal Vit-Fe Fumarate-FA (PRENATAL VITAMIN PO) Take 1 tablet by mouth daily.    08/10/2015 at Unknown time  . albuterol (PROVENTIL HFA;VENTOLIN HFA) 108 (90 BASE) MCG/ACT inhaler Inhale 2 puffs into the lungs every 6 (six) hours as needed for wheezing or shortness of breath.   More than a month at Unknown time    Review of Systems  Constitutional: Negative for fever and chills.  Eyes: Negative for blurred vision and double vision.  Respiratory: Negative for cough and shortness of breath.   Cardiovascular: Negative for chest pain and orthopnea.  Gastrointestinal: Negative for nausea and vomiting.  Genitourinary: Negative for dysuria, frequency and flank pain.  Musculoskeletal:  Negative for myalgias.  Skin: Negative for rash.  Neurological: Negative for dizziness, tingling, weakness and headaches.  Endo/Heme/Allergies: Does not bruise/bleed easily.  Psychiatric/Behavioral: Negative for depression and suicidal ideas. The patient is not nervous/anxious.    Physical Exam   Blood pressure 127/83, pulse 103, temperature 98.7 F (37.1 C), temperature source Oral, resp. rate 16, height $RemoveBe'5\' 1"'yAcDCRqMZ$  (1.549 m), weight 200 lb 6 oz (90.89 kg), last menstrual period 12/18/2014, SpO2 99 %, unknown if currently breastfeeding.  Physical Exam  Nursing note and vitals reviewed. Constitutional: She is oriented to person, place, and time. She appears well-developed and well-nourished. No distress.  Pregnant female  HENT:  Head: Normocephalic and atraumatic.  Eyes: Conjunctivae are normal. No scleral  icterus.  Neck: Normal range of motion. Neck supple.  Cardiovascular: Normal rate and intact distal pulses.   Respiratory: Effort normal. She exhibits no tenderness.  GI: Soft. There is no tenderness. There is no rebound and no guarding.  Gravid  Genitourinary: Vagina normal.  Musculoskeletal: Normal range of motion. She exhibits no edema.  Neurological: She is alert and oriented to person, place, and time.  Skin: Skin is warm and dry. No rash noted.  Psychiatric: She has a normal mood and affect.   Last cervical exam: 2/th/high on 8/24 (document on discharge summary) Dilation: 1.5 Effacement (%): 60 Cervical Position: Posterior Station: -2 Presentation: Vertex Exam by:: DCALLAWAY, RN      MAU Course  Procedures  MDM Fetal fibronectin- NEGATIVE NST-140/mod/+accels, no decels Toco- irritable  Assessment and Plan  Brenda Spencer is a 24 y.o. [phone] at [redacted]w[redacted]d presenting with contractions that started at 1500 on 8/27.  #Abdominal cramping: ddx includes MSK. Unlikely PTL given lack of cervical change since 8/24. Unlikely urinary in  [place] given negative UA.  - Will give nubain and flexeril here - Rx for Flexeril for home - encouraged to follow up in clinic - Reviewed preterm labor precautions  #FWB: Reactive. S/p BMZ on 8/22 and 8/23  Juanita Craver [facility] 08/11/2015, 4:16 AM

## 2015-08-11 NOTE — MAU Note (Signed)
Contractions since 3 pm and are getting stronger, unable to time them.  No bleeding. No leaking.  Baby moving well. Mucus discharge yesterday morning.

## 2015-08-11 NOTE — MAU Note (Signed)
PT  SAYS  SHE  STARTED  UC  AT 3PM-  AND   HAS  GOTTEN  STRONGER.    SAYS  SHE WAS ADMITTED  LAST  WEEK.    DENIES HSV AND  MRSA.

## 2015-08-14 ENCOUNTER — Ambulatory Visit (INDEPENDENT_AMBULATORY_CARE_PROVIDER_SITE_OTHER): Payer: Medicaid Other | Admitting: Certified Nurse Midwife

## 2015-08-14 VITALS — BP 135/77 | HR 119 | Temp 98.3°F | Wt 196.1 lb

## 2015-08-14 DIAGNOSIS — Z3483 Encounter for supervision of other normal pregnancy, third trimester: Secondary | ICD-10-CM | POA: Diagnosis present

## 2015-08-14 DIAGNOSIS — O99820 Streptococcus B carrier state complicating pregnancy: Secondary | ICD-10-CM

## 2015-08-14 DIAGNOSIS — O98819 Other maternal infectious and parasitic diseases complicating pregnancy, unspecified trimester: Principal | ICD-10-CM

## 2015-08-14 DIAGNOSIS — B951 Streptococcus, group B, as the cause of diseases classified elsewhere: Secondary | ICD-10-CM

## 2015-08-14 LAB — POCT URINALYSIS DIP (DEVICE)
Bilirubin Urine: NEGATIVE
Glucose, UA: NEGATIVE mg/dL
Hgb urine dipstick: NEGATIVE
Ketones, ur: NEGATIVE mg/dL
Nitrite: NEGATIVE
Protein, ur: NEGATIVE mg/dL
Specific Gravity, Urine: 1.02 (ref 1.005–1.030)
Urobilinogen, UA: 1 mg/dL (ref 0.0–1.0)
pH: 7 (ref 5.0–8.0)

## 2015-08-14 NOTE — Progress Notes (Signed)
Subjective:  Brenda Spencer is a 24 y.o. [phone] at [redacted]w[redacted]d being seen today for ongoing prenatal care.  Patient reports occasional contractions.  Contractions: Irritability.  Vag. Bleeding: Bloody Show. Movement: Present. Denies leaking of fluid.   The following portions of the patient's history were reviewed and updated as appropriate: allergies, current medications, past family history, past medical history, past social history, past surgical history and problem list.   Objective:   Filed Vitals:   08/14/15 0812  BP: 135/77  Pulse: 119  Temp: 98.3 F (36.8 C)  Weight: 196 lb 1.6 oz (88.95 kg)    Fetal Status:     Movement: Present     General:  Alert, oriented and cooperative. Patient is in no acute distress.  Skin: Skin is warm and dry. No rash noted.   Cardiovascular: Normal heart rate noted  Respiratory: Normal respiratory effort, no problems with respiration noted  Abdomen: Soft, gravid, appropriate for gestational age. Pain/Pressure: Present     Pelvic: Vag. Bleeding: Bloody Show Vag D/C Character: Mucous   Cervical exam performed        Extremities: Normal range of motion.  Edema: None  Mental Status: Normal mood and affect. Normal behavior. Normal judgment and thought content.   Urinalysis: Urine Protein: Negative Urine Glucose: Negative  Assessment and Plan:  Pregnancy: [id number] at 109w1d  1. Group B streptococcal infection during pregnancy   Preterm labor symptoms and general obstetric precautions including but not limited to vaginal bleeding, contractions, leaking of fluid and fetal movement were reviewed in detail with the patient. Please refer to After Visit Summary for other counseling recommendations.  No Follow-up on file.   Larey Days, CNM

## 2015-08-14 NOTE — Patient Instructions (Signed)
Group B Streptococcus Infection During Pregnancy Group B streptococcus (GBS) is a type of bacteria often found in healthy women. GBS is not the same as the bacteria that causes strep throat. You may have GBS in your vagina, rectum, or bladder. GBS does not spread through sexual contact, but it can be passed to a baby during childbirth. This can be dangerous for your baby. It is not dangerous to you and usually does not cause any symptoms. Your health care provider may test you for GBS when your pregnancy is between 35 and 37 weeks. GBS is dangerous only during birth, so there is no need to test for it earlier. It is possible to have GBS during pregnancy and never pass it to your baby. If your test results are positive for GBS, your health care provider may recommend giving you antibiotic medicine during delivery to make sure your baby stays healthy. RISK FACTORS You are more likely to pass GBS to your baby if:   Your water breaks (ruptured membrane) or you go into labor before 37 weeks.  Your water breaks 18 hours before you deliver.  You passed GBS during a previous pregnancy.  You have a urinary tract infection caused by GBS any time during pregnancy.  You have a fever during labor. SYMPTOMS Most women who have GBS do not have any symptoms. If you have a urinary tract infection caused by GBS, you might have frequent or painful urination and fever. Babies who get GBS usually show symptoms within 7 days of birth. Symptoms may include:   Breathing problems.  Heart and blood pressure problems.  Digestive and kidney problems. DIAGNOSIS Routine screening for GBS is recommended for all pregnant women. A health care provider takes a sample of the fluid in your vagina and rectum with a swab. It is then sent to a lab to be checked for GBS. A sample of your urine may also be checked for the bacteria.  TREATMENT If you test positive for GBS, you may need treatment with an antibiotic medicine during  labor. As soon as you go into labor, or as soon as your membranes rupture, you will get the antibiotic medicine through an IV access. You will continue to get the medicine until after you give birth. You do not need antibiotic medicine if you are having a cesarean delivery.If your baby shows signs or symptoms of GBS after birth, your baby can also be treated with an antibiotic medicine. HOME CARE INSTRUCTIONS   Take all antibiotic medicine as prescribed by your health care provider. Only take medicine as directed.   Continue with prenatal visits and care.   Keep all follow-up appointments.  SEEK MEDICAL CARE IF:   You have pain when you urinate.   You have to urinate frequently.   You have a fever.  SEEK IMMEDIATE MEDICAL CARE IF:   Your membranes rupture.  You go into labor. Document Released: 03/08/2008 Document Revised: 12/05/2013 Document Reviewed: 09/22/2013 ExitCare Patient Information 2015 [employer]. This information is not intended to replace advice given to you by your health care provider. Make sure you discuss any questions you have with your health care provider.  

## 2015-08-14 NOTE — Progress Notes (Signed)
Pain- epigastric and lower abd  Pt reports going to MAU on Saturday; pt requests to have cervix checked

## 2015-08-28 ENCOUNTER — Ambulatory Visit (INDEPENDENT_AMBULATORY_CARE_PROVIDER_SITE_OTHER): Payer: Medicaid Other | Admitting: Family Medicine

## 2015-08-28 ENCOUNTER — Other Ambulatory Visit (HOSPITAL_COMMUNITY)
Admission: RE | Admit: 2015-08-28 | Discharge: 2015-08-28 | Disposition: A | Discharge status: Home / Self Care | Payer: Medicaid Other | Source: Ambulatory Visit | Attending: Family Medicine | Admitting: Family Medicine

## 2015-08-28 VITALS — BP 137/80 | HR 112 | Temp 98.6°F | Wt 199.6 lb

## 2015-08-28 DIAGNOSIS — O99213 Obesity complicating pregnancy, third trimester: Secondary | ICD-10-CM | POA: Diagnosis not present

## 2015-08-28 DIAGNOSIS — Z118 Encounter for screening for other infectious and parasitic diseases: Secondary | ICD-10-CM

## 2015-08-28 DIAGNOSIS — O26843 Uterine size-date discrepancy, third trimester: Secondary | ICD-10-CM | POA: Diagnosis not present

## 2015-08-28 DIAGNOSIS — O0993 Supervision of high risk pregnancy, unspecified, third trimester: Secondary | ICD-10-CM | POA: Diagnosis not present

## 2015-08-28 DIAGNOSIS — O09293 Supervision of pregnancy with other poor reproductive or obstetric history, third trimester: Secondary | ICD-10-CM

## 2015-08-28 DIAGNOSIS — O99013 Anemia complicating pregnancy, third trimester: Secondary | ICD-10-CM

## 2015-08-28 DIAGNOSIS — E669 Obesity, unspecified: Secondary | ICD-10-CM

## 2015-08-28 DIAGNOSIS — O99820 Streptococcus B carrier state complicating pregnancy: Secondary | ICD-10-CM

## 2015-08-28 DIAGNOSIS — Z8489 Family history of other specified conditions: Secondary | ICD-10-CM

## 2015-08-28 DIAGNOSIS — O99019 Anemia complicating pregnancy, unspecified trimester: Secondary | ICD-10-CM

## 2015-08-28 DIAGNOSIS — Z113 Encounter for screening for infections with a predominantly sexual mode of transmission: Secondary | ICD-10-CM | POA: Diagnosis not present

## 2015-08-28 DIAGNOSIS — Z8679 Personal history of other diseases of the circulatory system: Secondary | ICD-10-CM

## 2015-08-28 DIAGNOSIS — Z8279 Family history of other congenital malformations, deformations and chromosomal abnormalities: Secondary | ICD-10-CM

## 2015-08-28 DIAGNOSIS — O98819 Other maternal infectious and parasitic diseases complicating pregnancy, unspecified trimester: Secondary | ICD-10-CM

## 2015-08-28 DIAGNOSIS — B951 Streptococcus, group B, as the cause of diseases classified elsewhere: Secondary | ICD-10-CM

## 2015-08-28 DIAGNOSIS — O99212 Obesity complicating pregnancy, second trimester: Secondary | ICD-10-CM

## 2015-08-28 DIAGNOSIS — Z23 Encounter for immunization: Secondary | ICD-10-CM | POA: Diagnosis not present

## 2015-08-28 LAB — CBC
HCT: 30.5 % — ABNORMAL LOW (ref 36.0–46.0)
HEMOGLOBIN: 9.6 g/dL — AB (ref 12.0–15.0)
MCH: 23.5 pg — AB (ref 26.0–34.0)
MCHC: 31.5 g/dL (ref 30.0–36.0)
MCV: 74.8 fL — ABNORMAL LOW (ref 78.0–100.0)
MPV: 11.1 fL (ref 8.6–12.4)
PLATELETS: 202 10*3/uL (ref 150–400)
RBC: 4.08 MIL/uL (ref 3.87–5.11)
RDW: 16.8 % — ABNORMAL HIGH (ref 11.5–15.5)
WBC: 9 10*3/uL (ref 4.0–10.5)

## 2015-08-28 LAB — POCT URINALYSIS DIP (DEVICE)
Bilirubin Urine: NEGATIVE
GLUCOSE, UA: NEGATIVE mg/dL
Hgb urine dipstick: NEGATIVE
Ketones, ur: NEGATIVE mg/dL
Nitrite: NEGATIVE
PH: 6.5 (ref 5.0–8.0)
PROTEIN: NEGATIVE mg/dL
Specific Gravity, Urine: 1.02 (ref 1.005–1.030)
UROBILINOGEN UA: 1 mg/dL (ref 0.0–1.0)

## 2015-08-28 LAB — OB RESULTS CONSOLE GC/CHLAMYDIA: Gonorrhea: NEGATIVE

## 2015-08-28 LAB — OB RESULTS CONSOLE GBS: STREP GROUP B AG: NEGATIVE

## 2015-08-28 MED ORDER — RANITIDINE HCL 150 MG PO TABS: 150.0000 mg | ORAL_TABLET | Freq: Two times a day (BID) | ORAL | Status: DC

## 2015-08-28 MED ORDER — FERROUS SULFATE 325 (65 FE) MG PO TABS: 325.0000 mg | ORAL_TABLET | Freq: Every day | ORAL | Status: DC

## 2015-08-28 NOTE — Progress Notes (Signed)
Subjective:  Brenda Spencer is a 24 y.o. [phone] at [redacted]w[redacted]d being seen today for ongoing prenatal care.  Patient reports no complaints and shortness fo breath.  Contractions: Irritability.  Vag. Bleeding: None. Movement: Present. Denies leaking of fluid.  SOB: Reports this has been persistent but worsened in the last 2-3 days. She reports trouble lying back. Denies swelling in legs and hands. No fevers/chills/Nausea/vomiting  The following portions of the patient's history were reviewed and updated as appropriate: allergies, current medications, past family history, past medical history, past social history, past surgical history and problem list.   Objective:   Filed Vitals:   08/28/15 0947  BP: 137/80  Pulse: 112  Temp: 98.6 F (37 C)  Weight: 199 lb 9.6 oz (90.538 kg)    Fetal Status: Fetal Heart Rate (bpm): 157   Movement: Present     General:  Alert, oriented and cooperative. Patient is in no acute distress.  Skin: Skin is warm and dry. No rash noted.   Cardiovascular: Normal heart rate noted. Regular rythym. No m/r/g. No JVD present  Respiratory: Normal respiratory effort, no problems with respiration noted. CTAB with full aeration of lower lung fields. No crackles.   Abdomen: Soft, gravid, appropriate for gestational age. Pain/Pressure: Present     Pelvic: Vag. Bleeding: None Vag D/C Character: Mucous   Cervical exam deferred        Extremities: Normal range of motion.  Edema: None  Mental Status: Normal mood and affect. Normal behavior. Normal judgment and thought content.   Urinalysis: Urine Protein: Negative Urine Glucose: Negative  Assessment and Plan:  Pregnancy: [id number] at [redacted]w[redacted]d  1. History of congestive heart failure - lungs clear, no edema today. Does have SOB but likely physiologic. If continued or worsening consider echo to r/o cardiomyopathy or CHF though this is low likelihood she does have hx of CHF with prior preg int eh setting of preX  2. Parent of child  with chromosome abnormality  3. Hx of preeclampsia, prior pregnancy, currently pregnant, third trimester BP is stable today.  4. Group B streptococcal infection during pregnancy GBS cx obtained  5. Uterine size-date discrepancy in third trimester, antepartum Continue to monitor  6. Anemia affecting pregnancy Refilled iron CBC today to check hgb   7. Obesity affecting pregnancy in second trimester, antepartum  8. Supervision of high risk pregnancy, antepartum, third trimester Updated overview GBS- though will need treatment regardless in labor given bacteruria. GC/CT  Preterm labor symptoms and general obstetric precautions including but not limited to vaginal bleeding, contractions, leaking of fluid and fetal movement were reviewed in detail with the patient. Please refer to After Visit Summary for other counseling recommendations.  Return in about 1 week (around 09/04/2015) for Routine prenatal care.   Caren Macadam, MD

## 2015-08-28 NOTE — Patient Instructions (Signed)
Third Trimester of Pregnancy The third trimester is from week 29 through week 42, months 7 through 9. The third trimester is a time when the fetus is growing rapidly. At the end of the ninth month, the fetus is about 20 inches in length and weighs 6-10 pounds.  BODY CHANGES Your body goes through many changes during pregnancy. The changes vary from woman to woman.   Your weight will continue to increase. You can expect to gain 25-35 pounds (11-16 kg) by the end of the pregnancy.  You may begin to get stretch marks on your hips, abdomen, and breasts.  You may urinate more often because the fetus is moving lower into your pelvis and pressing on your bladder.  You may develop or continue to have heartburn as a result of your pregnancy.  You may develop constipation because certain hormones are causing the muscles that push waste through your intestines to slow down.  You may develop hemorrhoids or swollen, bulging veins (varicose veins).  You may have pelvic pain because of the weight gain and pregnancy hormones relaxing your joints between the bones in your pelvis. Backaches may result from overexertion of the muscles supporting your posture.  You may have changes in your hair. These can include thickening of your hair, rapid growth, and changes in texture. Some women also have hair loss during or after pregnancy, or hair that feels dry or thin. Your hair will most likely return to normal after your baby is born.  Your breasts will continue to grow and be tender. A yellow discharge may leak from your breasts called colostrum.  Your belly button may stick out.  You may feel short of breath because of your expanding uterus.  You may notice the fetus "dropping," or moving lower in your abdomen.  You may have a bloody mucus discharge. This usually occurs a few days to a week before labor begins.  Your cervix becomes thin and soft (effaced) near your due date. WHAT TO EXPECT AT YOUR PRENATAL  EXAMS  You will have prenatal exams every 2 weeks until week 36. Then, you will have weekly prenatal exams. During a routine prenatal visit:  You will be weighed to make sure you and the fetus are growing normally.  Your blood pressure is taken.  Your abdomen will be measured to track your baby's growth.  The fetal heartbeat will be listened to.  Any test results from the previous visit will be discussed.  You may have a cervical check near your due date to see if you have effaced. At around 36 weeks, your caregiver will check your cervix. At the same time, your caregiver will also perform a test on the secretions of the vaginal tissue. This test is to determine if a type of bacteria, Group B streptococcus, is present. Your caregiver will explain this further. Your caregiver may ask you:  What your birth plan is.  How you are feeling.  If you are feeling the baby move.  If you have had any abnormal symptoms, such as leaking fluid, bleeding, severe headaches, or abdominal cramping.  If you have any questions. Other tests or screenings that may be performed during your third trimester include:  Blood tests that check for low iron levels (anemia).  Fetal testing to check the health, activity level, and growth of the fetus. Testing is done if you have certain medical conditions or if there are problems during the pregnancy. FALSE LABOR You may feel small, irregular contractions that   eventually go away. These are called Braxton Hicks contractions, or false labor. Contractions may last for hours, days, or even weeks before true labor sets in. If contractions come at regular intervals, intensify, or become painful, it is best to be seen by your caregiver.  SIGNS OF LABOR   Menstrual-like cramps.  Contractions that are 5 minutes apart or less.  Contractions that start on the top of the uterus and spread down to the lower abdomen and back.  A sense of increased pelvic pressure or back  pain.  A watery or bloody mucus discharge that comes from the vagina. If you have any of these signs before the 37th week of pregnancy, call your caregiver right away. You need to go to the hospital to get checked immediately. HOME CARE INSTRUCTIONS   Avoid all smoking, herbs, alcohol, and unprescribed drugs. These chemicals affect the formation and growth of the baby.  Follow your caregiver's instructions regarding medicine use. There are medicines that are either safe or unsafe to take during pregnancy.  Exercise only as directed by your caregiver. Experiencing uterine cramps is a good sign to stop exercising.  Continue to eat regular, healthy meals.  Wear a good support bra for breast tenderness.  Do not use hot tubs, steam rooms, or saunas.  Wear your seat belt at all times when driving.  Avoid raw meat, uncooked cheese, cat litter boxes, and soil used by cats. These carry germs that can cause birth defects in the baby.  Take your prenatal vitamins.  Try taking a stool softener (if your caregiver approves) if you develop constipation. Eat more high-fiber foods, such as fresh vegetables or fruit and whole grains. Drink plenty of fluids to keep your urine clear or pale yellow.  Take warm sitz baths to soothe any pain or discomfort caused by hemorrhoids. Use hemorrhoid cream if your caregiver approves.  If you develop varicose veins, wear support hose. Elevate your feet for 15 minutes, 3-4 times a day. Limit salt in your diet.  Avoid heavy lifting, wear low heal shoes, and practice good posture.  Rest a lot with your legs elevated if you have leg cramps or low back pain.  Visit your dentist if you have not gone during your pregnancy. Use a soft toothbrush to brush your teeth and be gentle when you floss.  A sexual relationship may be continued unless your caregiver directs you otherwise.  Do not travel far distances unless it is absolutely necessary and only with the approval  of your caregiver.  Take prenatal classes to understand, practice, and ask questions about the labor and delivery.  Make a trial run to the hospital.  Pack your hospital bag.  Prepare the baby's nursery.  Continue to go to all your prenatal visits as directed by your caregiver. SEEK MEDICAL CARE IF:  You are unsure if you are in labor or if your water has broken.  You have dizziness.  You have mild pelvic cramps, pelvic pressure, or nagging pain in your abdominal area.  You have persistent nausea, vomiting, or diarrhea.  You have a bad smelling vaginal discharge.  You have pain with urination. SEEK IMMEDIATE MEDICAL CARE IF:   You have a fever.  You are leaking fluid from your vagina.  You have spotting or bleeding from your vagina.  You have severe abdominal cramping or pain.  You have rapid weight loss or gain.  You have shortness of breath with chest pain.  You notice sudden or extreme swelling   of your face, hands, ankles, feet, or legs.  You have not felt your baby move in over an hour.  You have severe headaches that do not go away with medicine.  You have vision changes. Document Released: 11/24/2001 Document Revised: 12/05/2013 Document Reviewed: 01/31/2013 Exit[employer] Patient Information 2015 [employer]. This information is not intended to replace advice given to you by your health care provider. Make sure you discuss any questions you have with your health care provider.  

## 2015-08-29 LAB — GC/CHLAMYDIA PROBE AMP (~~LOC~~) NOT AT ARMC
CHLAMYDIA, DNA PROBE: NEGATIVE
NEISSERIA GONORRHEA: NEGATIVE

## 2015-08-29 MED ORDER — FERROUS SULFATE 325 (65 FE) MG PO TABS: 325.0000 mg | ORAL_TABLET | Freq: Two times a day (BID) | ORAL | Status: DC

## 2015-08-29 NOTE — Addendum Note (Signed)
Addended by: Caryl Bis on: 08/29/2015 07:36 AM   Modules accepted: Orders

## 2015-08-30 LAB — CULTURE, BETA STREP (GROUP B ONLY)

## 2015-09-02 ENCOUNTER — Telehealth: Payer: Self-pay | Admitting: General Practice

## 2015-09-02 NOTE — Telephone Encounter (Signed)
Per Dr Ernestina Patches, patient needs to increase iron to twice daily. Called patient and informed her of results and recommendations. Patient verbalized understanding and had no questions

## 2015-09-05 ENCOUNTER — Encounter: Payer: Self-pay | Admitting: Obstetrics & Gynecology

## 2015-09-05 ENCOUNTER — Ambulatory Visit (INDEPENDENT_AMBULATORY_CARE_PROVIDER_SITE_OTHER): Payer: Medicaid Other | Admitting: Obstetrics & Gynecology

## 2015-09-05 VITALS — BP 130/76 | HR 108 | Temp 98.3°F | Wt 202.3 lb

## 2015-09-05 DIAGNOSIS — Z7722 Contact with and (suspected) exposure to environmental tobacco smoke (acute) (chronic): Secondary | ICD-10-CM | POA: Diagnosis not present

## 2015-09-05 DIAGNOSIS — O26843 Uterine size-date discrepancy, third trimester: Secondary | ICD-10-CM

## 2015-09-05 DIAGNOSIS — E669 Obesity, unspecified: Secondary | ICD-10-CM | POA: Diagnosis not present

## 2015-09-05 DIAGNOSIS — O0993 Supervision of high risk pregnancy, unspecified, third trimester: Secondary | ICD-10-CM

## 2015-09-05 DIAGNOSIS — O99019 Anemia complicating pregnancy, unspecified trimester: Secondary | ICD-10-CM | POA: Diagnosis not present

## 2015-09-05 DIAGNOSIS — O99212 Obesity complicating pregnancy, second trimester: Secondary | ICD-10-CM

## 2015-09-05 DIAGNOSIS — O09293 Supervision of pregnancy with other poor reproductive or obstetric history, third trimester: Secondary | ICD-10-CM | POA: Diagnosis not present

## 2015-09-05 LAB — POCT URINALYSIS DIP (DEVICE)
GLUCOSE, UA: NEGATIVE mg/dL
HGB URINE DIPSTICK: NEGATIVE
Nitrite: NEGATIVE
PROTEIN: 30 mg/dL — AB
SPECIFIC GRAVITY, URINE: 1.025 (ref 1.005–1.030)
UROBILINOGEN UA: 1 mg/dL (ref 0.0–1.0)
pH: 6.5 (ref 5.0–8.0)

## 2015-09-05 NOTE — Patient Instructions (Signed)
Breastfeeding Deciding to breastfeed is one of the best choices you can make for you and your baby. A change in hormones during pregnancy causes your breast tissue to grow and increases the number and size of your milk ducts. These hormones also allow proteins, sugars, and fats from your blood supply to make breast milk in your milk-producing glands. Hormones prevent breast milk from being released before your baby is born as well as prompt milk flow after birth. Once breastfeeding has begun, thoughts of your baby, as well as his or her sucking or crying, can stimulate the release of milk from your milk-producing glands.  BENEFITS OF BREASTFEEDING For Your Baby  Your first milk (colostrum) helps your baby's digestive system function better.   There are antibodies in your milk that help your baby fight off infections.   Your baby has a lower incidence of asthma, allergies, and sudden infant death syndrome.   The nutrients in breast milk are better for your baby than infant formulas and are designed uniquely for your baby's needs.   Breast milk improves your baby's brain development.   Your baby is less likely to develop other conditions, such as childhood obesity, asthma, or type 2 diabetes mellitus.  For You   Breastfeeding helps to create a very special bond between you and your baby.   Breastfeeding is convenient. Breast milk is always available at the correct temperature and costs nothing.   Breastfeeding helps to burn calories and helps you lose the weight gained during pregnancy.   Breastfeeding makes your uterus contract to its prepregnancy size faster and slows bleeding (lochia) after you give birth.   Breastfeeding helps to lower your risk of developing type 2 diabetes mellitus, osteoporosis, and breast or ovarian cancer later in life. SIGNS THAT YOUR BABY IS HUNGRY Early Signs of Hunger  Increased alertness or activity.  Stretching.  Movement of the head from  side to side.  Movement of the head and opening of the mouth when the corner of the mouth or cheek is stroked (rooting).  Increased sucking sounds, smacking lips, cooing, sighing, or squeaking.  Hand-to-mouth movements.  Increased sucking of fingers or hands. Late Signs of Hunger  Fussing.  Intermittent crying. Extreme Signs of Hunger Signs of extreme hunger will require calming and consoling before your baby will be able to breastfeed successfully. Do not wait for the following signs of extreme hunger to occur before you initiate breastfeeding:   Restlessness.  A loud, strong cry.   Screaming. BREASTFEEDING BASICS Breastfeeding Initiation  Find a comfortable place to sit or lie down, with your neck and back well supported.  Place a pillow or rolled up blanket under your baby to bring him or her to the level of your breast (if you are seated). Nursing pillows are specially designed to help support your arms and your baby while you breastfeed.  Make sure that your baby's abdomen is facing your abdomen.   Gently massage your breast. With your fingertips, massage from your chest wall toward your nipple in a circular motion. This encourages milk flow. You may need to continue this action during the feeding if your milk flows slowly.  Support your breast with 4 fingers underneath and your thumb above your nipple. Make sure your fingers are well away from your nipple and your baby's mouth.   Stroke your baby's lips gently with your finger or nipple.   When your baby's mouth is open wide enough, quickly bring your baby to your  breast, placing your entire nipple and as much of the colored area around your nipple (areola) as possible into your baby's mouth.   More areola should be visible above your baby's upper lip than below the lower lip.   Your baby's tongue should be between his or her lower gum and your breast.   Ensure that your baby's mouth is correctly positioned  around your nipple (latched). Your baby's lips should create a seal on your breast and be turned out (everted).  It is common for your baby to suck about 2-3 minutes in order to start the flow of breast milk. Latching Teaching your baby how to latch on to your breast properly is very important. An improper latch can cause nipple pain and decreased milk supply for you and poor weight gain in your baby. Also, if your baby is not latched onto your nipple properly, he or she may swallow some air during feeding. This can make your baby fussy. Burping your baby when you switch breasts during the feeding can help to get rid of the air. However, teaching your baby to latch on properly is still the best way to prevent fussiness from swallowing air while breastfeeding. Signs that your baby has successfully latched on to your nipple:    Silent tugging or silent sucking, without causing you pain.   Swallowing heard between every 3-4 sucks.    Muscle movement above and in front of his or her ears while sucking.  Signs that your baby has not successfully latched on to nipple:   Sucking sounds or smacking sounds from your baby while breastfeeding.  Nipple pain. If you think your baby has not latched on correctly, slip your finger into the corner of your baby's mouth to break the suction and place it between your baby's gums. Attempt breastfeeding initiation again. Signs of Successful Breastfeeding Signs from your baby:   A gradual decrease in the number of sucks or complete cessation of sucking.   Falling asleep.   Relaxation of his or her body.   Retention of a small amount of milk in his or her mouth.   Letting go of your breast by himself or herself. Signs from you:  Breasts that have increased in firmness, weight, and size 1-3 hours after feeding.   Breasts that are softer immediately after breastfeeding.  Increased milk volume, as well as a change in milk consistency and color by  the fifth day of breastfeeding.   Nipples that are not sore, cracked, or bleeding. Signs That Your Randel Books is Getting Enough Milk  Wetting at least 3 diapers in a 24-hour period. The urine should be clear and pale yellow by age 11 days.  At least 3 stools in a 24-hour period by age 11 days. The stool should be soft and yellow.  At least 3 stools in a 24-hour period by age 18 days. The stool should be seedy and yellow.  No loss of weight greater than 10% of birth weight during the first 55 days of age.  Average weight gain of 4-7 ounces (113-198 g) per week after age 36 days.  Consistent daily weight gain by age 9 days, without weight loss after the age of 2 weeks. After a feeding, your baby may spit up a small amount. This is common. BREASTFEEDING FREQUENCY AND DURATION Frequent feeding will help you make more milk and can prevent sore nipples and breast engorgement. Breastfeed when you feel the need to reduce the fullness of your breasts  or when your baby shows signs of hunger. This is called "breastfeeding on demand." Avoid introducing a pacifier to your baby while you are working to establish breastfeeding (the first 4-6 weeks after your baby is born). After this time you may choose to use a pacifier. Research has shown that pacifier use during the first year of a baby's life decreases the risk of sudden infant death syndrome (SIDS). Allow your baby to feed on each breast as long as he or she wants. Breastfeed until your baby is finished feeding. When your baby unlatches or falls asleep while feeding from the first breast, offer the second breast. Because newborns are often sleepy in the first few weeks of life, you may need to awaken your baby to get him or her to feed. Breastfeeding times will vary from baby to baby. However, the following rules can serve as a guide to help you ensure that your baby is properly fed:  Newborns (babies 5 weeks of age or younger) may breastfeed every 1-3  hours.  Newborns should not go longer than 3 hours during the day or 5 hours during the night without breastfeeding.  You should breastfeed your baby a minimum of 8 times in a 24-hour period until you begin to introduce solid foods to your baby at around 54 months of age. BREAST MILK PUMPING Pumping and storing breast milk allows you to ensure that your baby is exclusively fed your breast milk, even at times when you are unable to breastfeed. This is especially important if you are going back to work while you are still breastfeeding or when you are not able to be present during feedings. Your lactation consultant can give you guidelines on how long it is safe to store breast milk.  A breast pump is a machine that allows you to pump milk from your breast into a sterile bottle. The pumped breast milk can then be stored in a refrigerator or freezer. Some breast pumps are operated by hand, while others use electricity. Ask your lactation consultant which type will work best for you. Breast pumps can be purchased, but some hospitals and breastfeeding support groups lease breast pumps on a monthly basis. A lactation consultant can teach you how to hand express breast milk, if you prefer not to use a pump.  CARING FOR YOUR BREASTS WHILE YOU BREASTFEED Nipples can become dry, cracked, and sore while breastfeeding. The following recommendations can help keep your breasts moisturized and healthy:  Avoid using soap on your nipples.   Wear a supportive bra. Although not required, special nursing bras and tank tops are designed to allow access to your breasts for breastfeeding without taking off your entire bra or top. Avoid wearing underwire-style bras or extremely tight bras.  Air dry your nipples for 3-98minutes after each feeding.   Use only cotton bra pads to absorb leaked breast milk. Leaking of breast milk between feedings is normal.   Use lanolin on your nipples after breastfeeding. Lanolin helps to  maintain your skin's normal moisture barrier. If you use pure lanolin, you do not need to wash it off before feeding your baby again. Pure lanolin is not toxic to your baby. You may also hand express a few drops of breast milk and gently massage that milk into your nipples and allow the milk to air dry. In the first few weeks after giving birth, some women experience extremely full breasts (engorgement). Engorgement can make your breasts feel heavy, warm, and tender to the  touch. Engorgement peaks within 3-5 days after you give birth. The following recommendations can help ease engorgement:  Completely empty your breasts while breastfeeding or pumping. You may want to start by applying warm, moist heat (in the shower or with warm water-soaked hand towels) just before feeding or pumping. This increases circulation and helps the milk flow. If your baby does not completely empty your breasts while breastfeeding, pump any extra milk after he or she is finished.  Wear a snug bra (nursing or regular) or tank top for 1-2 days to signal your body to slightly decrease milk production.  Apply ice packs to your breasts, unless this is too uncomfortable for you.  Make sure that your baby is latched on and positioned properly while breastfeeding. If engorgement persists after 48 hours of following these recommendations, contact your health care provider or a lactation consultant. OVERALL HEALTH CARE RECOMMENDATIONS WHILE BREASTFEEDING  Eat healthy foods. Alternate between meals and snacks, eating 3 of each per day. Because what you eat affects your breast milk, some of the foods may make your baby more irritable than usual. Avoid eating these foods if you are sure that they are negatively affecting your baby.  Drink milk, fruit juice, and water to satisfy your thirst (about 10 glasses a day).   Rest often, relax, and continue to take your prenatal vitamins to prevent fatigue, stress, and anemia.  Continue  breast self-awareness checks.  Avoid chewing and smoking tobacco.  Avoid alcohol and drug use. Some medicines that may be harmful to your baby can pass through breast milk. It is important to ask your health care provider before taking any medicine, including all over-the-counter and prescription medicine as well as vitamin and herbal supplements. It is possible to become pregnant while breastfeeding. If birth control is desired, ask your health care provider about options that will be safe for your baby. SEEK MEDICAL CARE IF:   You feel like you want to stop breastfeeding or have become frustrated with breastfeeding.  You have painful breasts or nipples.  Your nipples are cracked or bleeding.  Your breasts are red, tender, or warm.  You have a swollen area on either breast.  You have a fever or chills.  You have nausea or vomiting.  You have drainage other than breast milk from your nipples.  Your breasts do not become full before feedings by the fifth day after you give birth.  You feel sad and depressed.  Your baby is too sleepy to eat well.  Your baby is having trouble sleeping.   Your baby is wetting less than 3 diapers in a 24-hour period.  Your baby has less than 3 stools in a 24-hour period.  Your baby's skin or the white part of his or her eyes becomes yellow.   Your baby is not gaining weight by 5 days of age. SEEK IMMEDIATE MEDICAL CARE IF:   Your baby is overly tired (lethargic) and does not want to wake up and feed.  Your baby develops an unexplained fever. Document Released: 11/30/2005 Document Revised: 12/05/2013 Document Reviewed: 05/24/2013 [employer] Patient Information 2015 [employer]. This information is not intended to replace advice given to you by your health care provider. Make sure you discuss any questions you have with your health care provider.  

## 2015-09-05 NOTE — Progress Notes (Signed)
Pt requests to have cervix checked and membranes sweep

## 2015-09-05 NOTE — Progress Notes (Deleted)
Subjective:  Brenda Spencer is a 24 y.o. [phone] at [redacted]w[redacted]d being seen today for ongoing prenatal care.  Patient reports {sx:14538}.   .   .  . Denies leaking of fluid.   The following portions of the patient's history were reviewed and updated as appropriate: allergies, current medications, past family history, past medical history, past social history, past surgical history and problem list.   Objective:  There were no vitals filed for this visit.  Fetal Status:           General:  Alert, oriented and cooperative. Patient is in no acute distress.  Skin: Skin is warm and dry. No rash noted.   Cardiovascular: Normal heart rate noted  Respiratory: Normal respiratory effort, no problems with respiration noted  Abdomen: Soft, gravid, appropriate for gestational age.       Pelvic:       {Blank single:19197::"Cervical exam performed","Cervical exam deferred"}        Extremities: Normal range of motion.     Mental Status: Normal mood and affect. Normal behavior. Normal judgment and thought content.   Urinalysis:      Assessment and Plan:  Pregnancy: [id number] at [redacted]w[redacted]d  1. Second hand tobacco smoke exposure ***  2. Supervision of high risk pregnancy, antepartum, third trimester ***  3. Hx of preeclampsia, prior pregnancy, currently pregnant, third trimester ***  4. Obesity affecting pregnancy in second trimester, antepartum ***  5. Uterine size-date discrepancy in third trimester, antepartum ***  6. Anemia affecting pregnancy ***  {Blank single:19197::"Term","Preterm"} labor symptoms and general obstetric precautions including but not limited to vaginal bleeding, contractions, leaking of fluid and fetal movement were reviewed in detail with the patient. Please refer to After Visit Summary for other counseling recommendations.  Return in about 1 week (around 09/12/2015) for Routine prenatal care.   Caren Macadam, MD

## 2015-09-05 NOTE — Progress Notes (Signed)
Subjective:  Brenda Spencer is a 24 y.o. [phone] at [redacted]w[redacted]d being seen today for ongoing prenatal care.  Patient reports no bleeding, no contractions and mild cramping.  Contractions: Irritability.  Vag. Bleeding: None. Movement: Present. Denies leaking of fluid.   The following portions of the patient's history were reviewed and updated as appropriate: allergies, current medications, past family history, past medical history, past social history, past surgical history and problem list.   Objective:   Filed Vitals:   09/05/15 1305  BP: 130/76  Pulse: 108  Temp: 98.3 F (36.8 C)  Weight: 202 lb 4.8 oz (91.763 kg)    Fetal Status: Fetal Heart Rate (bpm): 156 Fundal Height: 41 cm Movement: Present     General:  Alert, oriented and cooperative. Patient is in no acute distress.  Skin: Skin is warm and dry. No rash noted.   Cardiovascular: Normal heart rate noted  Respiratory: Normal respiratory effort, no problems with respiration noted  Abdomen: Soft, gravid, appropriate for gestational age. Pain/Pressure: Present     Pelvic: Vag. Bleeding: None Vag D/C Character: Mucous   Cervical exam performed Dilation: 3 Effacement (%): 50 Station: Ballotable3cm/50%  Extremities: Normal range of motion.  Edema: None  Mental Status: Normal mood and affect. Normal behavior. Normal judgment and thought content.   Urinalysis: Urine Protein: 1+ Urine Glucose: Negative  Assessment and Plan:  Pregnancy: [id number] at [redacted]w[redacted]d  1. Second hand tobacco smoke exposure   2. Supervision of high risk pregnancy, antepartum, third trimester Parous cervix   3. Hx of preeclampsia, prior pregnancy, currently pregnant, third trimester BP wnl  4. Obesity affecting pregnancy in second trimester, antepartum  5. Uterine size-date discrepancy in third trimester, antepartum  6. Anemia affecting pregnancy  Term labor symptoms and general obstetric precautions including but not limited to vaginal bleeding,  contractions, leaking of fluid and fetal movement were reviewed in detail with the patient. Please refer to After Visit Summary for other counseling recommendations.  Return in about 1 week (around 09/12/2015) for Routine prenatal care.   Lavonia Drafts, MD

## 2015-09-07 ENCOUNTER — Encounter (HOSPITAL_COMMUNITY): Payer: Self-pay | Admitting: *Deleted

## 2015-09-07 ENCOUNTER — Inpatient Hospital Stay (HOSPITAL_COMMUNITY)
Admission: AD | Admit: 2015-09-07 | Discharge: 2015-09-07 | Disposition: A | Discharge status: Home / Self Care | Payer: Medicaid Other | Source: Ambulatory Visit | Attending: Obstetrics and Gynecology | Admitting: Obstetrics and Gynecology

## 2015-09-07 DIAGNOSIS — R030 Elevated blood-pressure reading, without diagnosis of hypertension: Secondary | ICD-10-CM | POA: Insufficient documentation

## 2015-09-07 DIAGNOSIS — Z7982 Long term (current) use of aspirin: Secondary | ICD-10-CM | POA: Diagnosis not present

## 2015-09-07 DIAGNOSIS — O9989 Other specified diseases and conditions complicating pregnancy, childbirth and the puerperium: Secondary | ICD-10-CM | POA: Diagnosis not present

## 2015-09-07 DIAGNOSIS — M549 Dorsalgia, unspecified: Secondary | ICD-10-CM | POA: Diagnosis not present

## 2015-09-07 DIAGNOSIS — M545 Low back pain: Secondary | ICD-10-CM | POA: Diagnosis not present

## 2015-09-07 DIAGNOSIS — O0993 Supervision of high risk pregnancy, unspecified, third trimester: Secondary | ICD-10-CM

## 2015-09-07 DIAGNOSIS — Z3A37 37 weeks gestation of pregnancy: Secondary | ICD-10-CM | POA: Insufficient documentation

## 2015-09-07 DIAGNOSIS — O26843 Uterine size-date discrepancy, third trimester: Secondary | ICD-10-CM

## 2015-09-07 DIAGNOSIS — B951 Streptococcus, group B, as the cause of diseases classified elsewhere: Secondary | ICD-10-CM

## 2015-09-07 DIAGNOSIS — O98819 Other maternal infectious and parasitic diseases complicating pregnancy, unspecified trimester: Secondary | ICD-10-CM

## 2015-09-07 DIAGNOSIS — IMO0001 Reserved for inherently not codable concepts without codable children: Secondary | ICD-10-CM

## 2015-09-07 DIAGNOSIS — O99019 Anemia complicating pregnancy, unspecified trimester: Secondary | ICD-10-CM

## 2015-09-07 LAB — COMPREHENSIVE METABOLIC PANEL
ALBUMIN: 2.4 g/dL — AB (ref 3.5–5.0)
ALT: 10 U/L — ABNORMAL LOW (ref 14–54)
ANION GAP: 9 (ref 5–15)
AST: 14 U/L — ABNORMAL LOW (ref 15–41)
Alkaline Phosphatase: 167 U/L — ABNORMAL HIGH (ref 38–126)
BUN: 7 mg/dL (ref 6–20)
CHLORIDE: 107 mmol/L (ref 101–111)
CO2: 21 mmol/L — AB (ref 22–32)
Calcium: 8.1 mg/dL — ABNORMAL LOW (ref 8.9–10.3)
Creatinine, Ser: 0.42 mg/dL — ABNORMAL LOW (ref 0.44–1.00)
GFR calc Af Amer: >60 mL/min (ref >60)
GFR calc non Af Amer: >60 mL/min (ref >60)
GLUCOSE: 82 mg/dL (ref 65–99)
POTASSIUM: 3.9 mmol/L (ref 3.5–5.1)
SODIUM: 137 mmol/L (ref 135–145)
Total Bilirubin: 0.4 mg/dL (ref 0.3–1.2)
Total Protein: 6.7 g/dL (ref 6.5–8.1)

## 2015-09-07 LAB — PROTEIN / CREATININE RATIO, URINE
CREATININE, URINE: 216 mg/dL
PROTEIN CREATININE RATIO: 0.18 mg/mg{creat} — AB (ref 0.00–0.15)
TOTAL PROTEIN, URINE: 39 mg/dL

## 2015-09-07 LAB — CBC
HEMATOCRIT: 29.7 % — AB (ref 36.0–46.0)
HEMOGLOBIN: 9.1 g/dL — AB (ref 12.0–15.0)
MCH: 23.6 pg — AB (ref 26.0–34.0)
MCHC: 30.6 g/dL (ref 30.0–36.0)
MCV: 77.1 fL — ABNORMAL LOW (ref 78.0–100.0)
Platelets: 196 10*3/uL (ref 150–400)
RBC: 3.85 MIL/uL — AB (ref 3.87–5.11)
RDW: 16.6 % — ABNORMAL HIGH (ref 11.5–15.5)
WBC: 9.4 10*3/uL (ref 4.0–10.5)

## 2015-09-07 NOTE — Discharge Instructions (Signed)

## 2015-09-07 NOTE — MAU Provider Note (Signed)
History     CSN: [id number] Arrival date and time: 09/07/15 1415  First Brenda Spencer Initiated Contact with Patient 09/07/15 1654      Chief Complaint  Patient presents with  . Contractions   HPI Patient is 24 y.o. [id number] with complaints of back pain. Started at 9 AM today.  Reports located in low back, non radiating. Reports belly will tighten with back pain. She reports this happening every 333m-1 hours. Denies dysuria, no CVA tenderness, no fevers, chills, nausea/vomiting. Thinks she lost her mucous plug.   +FM, denies LOF, VB, vaginal discharge.  OB History    Gravida Para Term Preterm AB TAB SAB Ectopic Multiple Living   _0 Obstetric Comments   2011 PIH;IOL; PP CHF-HOSPITALIZED X 1 WEEK      Past Medical History  Diagnosis Date  . Eczema   . CHF (congestive heart failure)     RESOLVED; HAD CARDIAC EVAL   . Congestive heart failure 2011    related to preeclampsia  . Hypothyroidism AGE 24   NO TX  . Pregnancy induced hypertension 2011  . Preterm labor 2011  . Infection     UTI OCC  . Infection [place]    [facility] Asthma 2009; [facility]    INHALER PRN  . Vaginal Pap smear, abnormal     colpo  . Hyperthyroidism     Past Surgical History  Procedure Laterality Date  . Tonsillectomy    . Wisdom tooth extraction    . Tear duct probing    . Cholecystectomy      Family History  Problem Relation Age of Onset  . Anesthesia problems Neg Hx   . Other Neg Hx   . Asthma Brother   . Learning disabilities Brother   . Asthma Daughter   . Learning disabilities Daughter   . Seizures Daughter   . Learning disabilities Other   . Colon cancer Maternal Grandmother   . Cancer Maternal Grandmother     colon  . Chromosomal disorder Daughter     1q21.1 microdeletion  . Cataracts Daughter     congenital    Social History  Substance Use Topics  . Smoking status: Never Smoker   . Smokeless tobacco: Never Used  . Alcohol Use: No     Allergies: No Known Allergies  Prescriptions prior to admission  Medication Sig Dispense Refill Last Dose  . aspirin 81 MG chewable tablet Chew 1 tablet (81 mg total) by mouth daily. 90 tablet 2 09/06/2015 at Unknown time  . ferrous sulfate 325 (65 FE) MG tablet Take 1 tablet (325 mg total) by mouth 2 (two) times daily with a meal. 60 tablet 3 09/06/2015 at Unknown time  . Prenatal Vit-Fe Fumarate-FA (PRENATAL VITAMIN PO) Take 1 tablet by mouth daily.    09/06/2015 at Unknown time  . ranitidine (ZANTAC) 150 MG tablet Take 1 tablet (150 mg total) by mouth 2 (two) times daily. 60 tablet 3 09/06/2015 at Unknown time  . albuterol (PROVENTIL HFA;VENTOLIN HFA) 108 (90 BASE) MCG/ACT inhaler Inhale 2 puffs into the lungs every 6 (six) hours as needed for wheezing or shortness of breath.   rescue  . cyclobenzaprine (FLEXERIL) 10 MG tablet Take 1 tablet (10 mg total) by mouth 3 (three) times daily as needed for muscle spasms. (Patient not taking: Reported on 09/07/2015) 30 tablet 2 Taking    Review of Systems  Constitutional: Negative for fever and chills.  Eyes: Negative for blurred vision and double vision.  Respiratory: Negative for cough and shortness of breath.   Cardiovascular: Negative for chest pain and orthopnea.  Gastrointestinal: Negative for nausea and vomiting.  Genitourinary: Negative for dysuria, frequency and flank pain.  Musculoskeletal: Negative for myalgias.  Skin: Negative for rash.  Neurological: Negative for dizziness, tingling, weakness and headaches.  Endo/Heme/Allergies: Does not bruise/bleed easily.  Psychiatric/Behavioral: Negative for depression and suicidal ideas. The patient is not nervous/anxious.    Physical Exam   Blood pressure 136/89, pulse 115, temperature 97.8 F (36.6 C), temperature source Oral, resp. rate 18, last menstrual period 12/18/2014, unknown if currently breastfeeding.  Physical Exam  Nursing note and vitals reviewed. Constitutional: She is  oriented to person, place, and time. She appears well-developed and well-nourished. No distress.  Pregnant female  HENT:  Head: Normocephalic and atraumatic.  Eyes: Conjunctivae are normal. No scleral icterus.  Neck: Normal range of motion. Neck supple.  Cardiovascular: Normal rate and intact distal pulses.   Respiratory: Effort normal. She exhibits no tenderness.  GI: Soft. There is no tenderness. There is no rebound and no guarding.  Gravid  Genitourinary: Vagina normal.  Musculoskeletal: Normal range of motion. She exhibits no edema.  Neurological: She is alert and oriented to person, place, and time.  Skin: Skin is warm and dry. No rash noted.  Psychiatric: She has a normal mood and affect.    MAU Course  Procedures  MDM CMP- WNL UPC- at baseline 0.18 (last was 0.17) CBC- hgb low at 9.1 but plts are wnl  NST: 140-155/mod/+accels/ no decels, wandering baseline but reactive throughout monitoring strip Toco: quiet  Assessment and Plan  Brenda Spencer is a 24 y.o. [phone] at 12w4dpresenting with low back pain and contractions with elevated BP in the setting of h/o preeclampsia.   #Elevated BP: concern would be for PreX but only two elevated BP 141/95 and 142/94. She did have several diastolics > 90 but is without sx and labs are normal. ddx includes gHTN, less likely Preeclampsia.  -Routine follow up in clinic - Reviewed strict return precautions regarding Preeclampsia -If elevate BP at clinic would meet criteria for gHTN and IOL would be indicate, she is quiet favorable. I swept her membranes today.   #Low back pain:  Likely back labor given regularity and association with abdominal/uterine tightening. She is likely in latent/early labor. Recommended flexeril use at home. -return for regular contractions (<10 minutes for 1-2 hours)  #FWB: reactive NST #Patient reaffirmed desire for BTL- consent on chart.   KJuanita CraverNRaleigh Endoscopy Center Cary9/24/2016, 4:55 PM

## 2015-09-07 NOTE — Plan of Care (Signed)
Pt. Urine in lab 

## 2015-09-07 NOTE — MAU Note (Signed)
Pt states she is having back pain, increased pelvic pressure & tightness in her abdomen which all started this morning.  Denies bleeding or LOF.

## 2015-09-08 ENCOUNTER — Inpatient Hospital Stay (EMERGENCY_DEPARTMENT_HOSPITAL)
Admission: AD | Admit: 2015-09-08 | Discharge: 2015-09-08 | Disposition: A | Discharge status: Home / Self Care | Payer: Medicaid Other | Source: Ambulatory Visit | Attending: Obstetrics & Gynecology | Admitting: Obstetrics & Gynecology

## 2015-09-08 ENCOUNTER — Encounter (HOSPITAL_COMMUNITY): Payer: Self-pay | Admitting: *Deleted

## 2015-09-08 DIAGNOSIS — M549 Dorsalgia, unspecified: Secondary | ICD-10-CM | POA: Diagnosis not present

## 2015-09-08 DIAGNOSIS — O98819 Other maternal infectious and parasitic diseases complicating pregnancy, unspecified trimester: Secondary | ICD-10-CM

## 2015-09-08 DIAGNOSIS — B951 Streptococcus, group B, as the cause of diseases classified elsewhere: Secondary | ICD-10-CM

## 2015-09-08 DIAGNOSIS — O0993 Supervision of high risk pregnancy, unspecified, third trimester: Secondary | ICD-10-CM

## 2015-09-08 DIAGNOSIS — O9989 Other specified diseases and conditions complicating pregnancy, childbirth and the puerperium: Secondary | ICD-10-CM

## 2015-09-08 DIAGNOSIS — O26843 Uterine size-date discrepancy, third trimester: Secondary | ICD-10-CM

## 2015-09-08 LAB — COMPREHENSIVE METABOLIC PANEL
ALBUMIN: 2.5 g/dL — AB (ref 3.5–5.0)
ALK PHOS: 173 U/L — AB (ref 38–126)
ALT: 10 U/L — ABNORMAL LOW (ref 14–54)
ANION GAP: 10 (ref 5–15)
AST: 14 U/L — ABNORMAL LOW (ref 15–41)
BILIRUBIN TOTAL: 0.5 mg/dL (ref 0.3–1.2)
BUN: 7 mg/dL (ref 6–20)
CALCIUM: 8.4 mg/dL — AB (ref 8.9–10.3)
CO2: 22 mmol/L (ref 22–32)
Chloride: 103 mmol/L (ref 101–111)
Creatinine, Ser: 0.52 mg/dL (ref 0.44–1.00)
GLUCOSE: 91 mg/dL (ref 65–99)
POTASSIUM: 3.9 mmol/L (ref 3.5–5.1)
Sodium: 135 mmol/L (ref 135–145)
TOTAL PROTEIN: 6.8 g/dL (ref 6.5–8.1)

## 2015-09-08 LAB — CBC
HEMATOCRIT: 30 % — AB (ref 36.0–46.0)
HEMOGLOBIN: 9.2 g/dL — AB (ref 12.0–15.0)
MCH: 23.7 pg — AB (ref 26.0–34.0)
MCHC: 30.7 g/dL (ref 30.0–36.0)
MCV: 77.3 fL — AB (ref 78.0–100.0)
Platelets: 198 10*3/uL (ref 150–400)
RBC: 3.88 MIL/uL (ref 3.87–5.11)
RDW: 16.6 % — AB (ref 11.5–15.5)
WBC: 10.3 10*3/uL (ref 4.0–10.5)

## 2015-09-08 LAB — PROTEIN / CREATININE RATIO, URINE
CREATININE, URINE: 335 mg/dL
PROTEIN CREATININE RATIO: 0.19 mg/mg{creat} — AB (ref 0.00–0.15)
Total Protein, Urine: 65 mg/dL

## 2015-09-08 MED ORDER — OXYCODONE-ACETAMINOPHEN 5-325 MG PO TABS
2.0000 | ORAL_TABLET | Freq: Once | ORAL | Status: AC
  Administered 2015-09-08: 2 via ORAL
  Filled 2015-09-08: qty 2

## 2015-09-08 NOTE — MAU Note (Signed)
Back pain,   Denies bleeding, ? Leaking fluid

## 2015-09-08 NOTE — Discharge Instructions (Signed)

## 2015-09-08 NOTE — MAU Provider Note (Signed)
History    CSN: [id number]  Arrival date and time: 09/08/15 1446   First Provider Initiated Contact with Patient 09/08/15 1625      Chief Complaint  Patient presents with  . Labor Eval   HPI  [id number] at 37.5 in with c/o back pain for two days.  OB History    Gravida Para Term Preterm AB TAB SAB Ectopic Multiple Living   '5 3 2 1 1  1   3      '$ Obstetric Comments   2011 PIH;IOL; PP CHF-HOSPITALIZED X 1 WEEK      Past Medical History  Diagnosis Date  . Eczema   . CHF (congestive heart failure)     RESOLVED; HAD CARDIAC EVAL   . Congestive heart failure 2011    related to preeclampsia  . Hypothyroidism AGE 24     NO TX  . Pregnancy induced hypertension 2011  . Preterm labor 2011  . Infection     UTI OCC  . Infection 2013    [place]  . Asthma 2009; CORNERSTONE    INHALER PRN  . Vaginal Pap smear, abnormal     colpo  . Hyperthyroidism     Past Surgical History  Procedure Laterality Date  . Tonsillectomy    . Wisdom tooth extraction    . Tear duct probing    . Cholecystectomy      Family History  Problem Relation Age of Onset  . Anesthesia problems Neg Hx   . Other Neg Hx   . Asthma Brother   . Learning disabilities Brother   . Asthma Daughter   . Learning disabilities Daughter   . Seizures Daughter   . Learning disabilities Other   . Colon cancer Maternal Grandmother   . Cancer Maternal Grandmother     colon  . Chromosomal disorder Daughter     1q21.1 microdeletion  . Cataracts Daughter     congenital    Social History  Substance Use Topics  . Smoking status: Never Smoker   . Smokeless tobacco: Never Used  . Alcohol Use: No    Allergies: No Known Allergies  Prescriptions prior to admission  Medication Sig Dispense Refill Last Dose  . albuterol (PROVENTIL HFA;VENTOLIN HFA) 108 (90 BASE) MCG/ACT inhaler Inhale 2 puffs into the lungs every 6 (six) hours as needed for wheezing or shortness of breath.   PRN at PRN  . aspirin 81 MG chewable  tablet Chew 81 mg by mouth at bedtime.   09/07/2015 at 2000  . cyclobenzaprine (FLEXERIL) 10 MG tablet Take 1 tablet (10 mg total) by mouth 3 (three) times daily as needed for muscle spasms. 30 tablet 2 Past Month at Unknown time  . ferrous sulfate 325 (65 FE) MG tablet Take 1 tablet (325 mg total) by mouth 2 (two) times daily with a meal. 60 tablet 3 09/07/2015 at Unknown time  . Prenatal Vit-Fe Fumarate-FA (PRENATAL MULTIVITAMIN) TABS tablet Take 1 tablet by mouth daily.   09/07/2015 at Unknown time  . ranitidine (ZANTAC) 150 MG tablet Take 1 tablet (150 mg total) by mouth 2 (two) times daily. 60 tablet 3 09/07/2015 at Unknown time    Review of Systems  Constitutional: Negative.   HENT: Negative.   Eyes: Negative.   Respiratory: Negative.   Cardiovascular: Negative.   Gastrointestinal: Positive for abdominal pain.  Genitourinary: Negative.   Musculoskeletal: Positive for back pain.  Skin: Negative.   Neurological: Negative.   Endo/Heme/Allergies: Negative.   Psychiatric/Behavioral: Negative.  Physical Exam   Blood pressure 114/74, pulse 108, last menstrual period 12/18/2014, unknown if currently breastfeeding.  Physical Exam  Constitutional: She is oriented to person, place, and time. She appears well-developed and well-nourished.  HENT:  Head: Normocephalic.  Eyes: Pupils are equal, round, and reactive to light.  Neck: Normal range of motion.  Cardiovascular: Normal rate, regular rhythm, normal heart sounds and intact distal pulses.   Respiratory: Effort normal and breath sounds normal.  GI: Soft. Bowel sounds are normal.  Genitourinary:  gravid  Musculoskeletal: Normal range of motion.  Neurological: She is alert and oriented to person, place, and time. She has normal reflexes.  Skin: Skin is warm and dry.  Psychiatric: She has a normal mood and affect. Her behavior is normal. Judgment and thought content normal.    MAU Course  Procedures  MDM Back pain in  pregnancy  Assessment and Plan  SVE 3/th/posdt/high. bp sl elevated on admit but has since returned to normal and PIH labs are al normal. Will d/c home.  Koren Shiver DARLENE 09/08/2015, 4:33 PM

## 2015-09-09 ENCOUNTER — Inpatient Hospital Stay (HOSPITAL_COMMUNITY)
Admission: AD | Admit: 2015-09-09 | Discharge: 2015-09-12 | DRG: 767 | Disposition: A | Discharge status: Home / Self Care | Payer: Medicaid Other | Source: Ambulatory Visit | Attending: Family Medicine | Admitting: Family Medicine

## 2015-09-09 ENCOUNTER — Telehealth: Payer: Self-pay | Admitting: *Deleted

## 2015-09-09 ENCOUNTER — Encounter (HOSPITAL_COMMUNITY): Payer: Self-pay

## 2015-09-09 ENCOUNTER — Inpatient Hospital Stay (HOSPITAL_COMMUNITY): Payer: Medicaid Other | Admitting: Anesthesiology

## 2015-09-09 DIAGNOSIS — Z8679 Personal history of other diseases of the circulatory system: Secondary | ICD-10-CM | POA: Diagnosis not present

## 2015-09-09 DIAGNOSIS — O9902 Anemia complicating childbirth: Secondary | ICD-10-CM | POA: Diagnosis present

## 2015-09-09 DIAGNOSIS — O98819 Other maternal infectious and parasitic diseases complicating pregnancy, unspecified trimester: Secondary | ICD-10-CM

## 2015-09-09 DIAGNOSIS — D649 Anemia, unspecified: Secondary | ICD-10-CM | POA: Diagnosis present

## 2015-09-09 DIAGNOSIS — Z825 Family history of asthma and other chronic lower respiratory diseases: Secondary | ICD-10-CM

## 2015-09-09 DIAGNOSIS — K219 Gastro-esophageal reflux disease without esophagitis: Secondary | ICD-10-CM | POA: Diagnosis present

## 2015-09-09 DIAGNOSIS — Z3A37 37 weeks gestation of pregnancy: Secondary | ICD-10-CM

## 2015-09-09 DIAGNOSIS — O99214 Obesity complicating childbirth: Secondary | ICD-10-CM | POA: Diagnosis present

## 2015-09-09 DIAGNOSIS — Z6838 Body mass index (BMI) 38.0-38.9, adult: Secondary | ICD-10-CM | POA: Diagnosis not present

## 2015-09-09 DIAGNOSIS — B951 Streptococcus, group B, as the cause of diseases classified elsewhere: Secondary | ICD-10-CM

## 2015-09-09 DIAGNOSIS — Z800 Family history of malignant neoplasm of digestive organs: Secondary | ICD-10-CM | POA: Diagnosis not present

## 2015-09-09 DIAGNOSIS — O4290 Premature rupture of membranes, unspecified as to length of time between rupture and onset of labor, unspecified weeks of gestation: Secondary | ICD-10-CM | POA: Diagnosis present

## 2015-09-09 DIAGNOSIS — O99824 Streptococcus B carrier state complicating childbirth: Secondary | ICD-10-CM | POA: Diagnosis present

## 2015-09-09 DIAGNOSIS — O0990 Supervision of high risk pregnancy, unspecified, unspecified trimester: Secondary | ICD-10-CM

## 2015-09-09 DIAGNOSIS — O26843 Uterine size-date discrepancy, third trimester: Secondary | ICD-10-CM | POA: Diagnosis present

## 2015-09-09 DIAGNOSIS — Z302 Encounter for sterilization: Secondary | ICD-10-CM

## 2015-09-09 DIAGNOSIS — O4292 Full-term premature rupture of membranes, unspecified as to length of time between rupture and onset of labor: Principal | ICD-10-CM | POA: Diagnosis present

## 2015-09-09 DIAGNOSIS — O9962 Diseases of the digestive system complicating childbirth: Secondary | ICD-10-CM | POA: Diagnosis present

## 2015-09-09 DIAGNOSIS — O99210 Obesity complicating pregnancy, unspecified trimester: Secondary | ICD-10-CM | POA: Diagnosis present

## 2015-09-09 DIAGNOSIS — O0993 Supervision of high risk pregnancy, unspecified, third trimester: Secondary | ICD-10-CM

## 2015-09-09 LAB — TYPE AND SCREEN
ABO/RH(D): A POS
Antibody Screen: NEGATIVE

## 2015-09-09 LAB — URINE MICROSCOPIC-ADD ON

## 2015-09-09 LAB — URINALYSIS, ROUTINE W REFLEX MICROSCOPIC
Bilirubin Urine: NEGATIVE
Glucose, UA: NEGATIVE mg/dL
Ketones, ur: 15 mg/dL — AB
Nitrite: NEGATIVE
Protein, ur: 30 mg/dL — AB
SPECIFIC GRAVITY, URINE: 1.025 (ref 1.005–1.030)
UROBILINOGEN UA: 1 mg/dL (ref 0.0–1.0)
pH: 6 (ref 5.0–8.0)

## 2015-09-09 LAB — CBC
HCT: 29.1 % — ABNORMAL LOW (ref 36.0–46.0)
Hemoglobin: 9.1 g/dL — ABNORMAL LOW (ref 12.0–15.0)
MCH: 24.1 pg — AB (ref 26.0–34.0)
MCHC: 31.3 g/dL (ref 30.0–36.0)
MCV: 77.2 fL — ABNORMAL LOW (ref 78.0–100.0)
PLATELETS: 200 10*3/uL (ref 150–400)
RBC: 3.77 MIL/uL — ABNORMAL LOW (ref 3.87–5.11)
RDW: 17.3 % — ABNORMAL HIGH (ref 11.5–15.5)
WBC: 10.2 10*3/uL (ref 4.0–10.5)

## 2015-09-09 LAB — POCT FERN TEST: POCT Fern Test: POSITIVE

## 2015-09-09 MED ORDER — EPHEDRINE 5 MG/ML INJ
10.0000 mg | INTRAVENOUS | Status: DC | PRN
Start: 2015-09-09 — End: 2015-09-10
  Filled 2015-09-09: qty 2

## 2015-09-09 MED ORDER — OXYCODONE-ACETAMINOPHEN 5-325 MG PO TABS: 2.0000 | ORAL_TABLET | ORAL | Status: DC | PRN

## 2015-09-09 MED ORDER — OXYCODONE-ACETAMINOPHEN 5-325 MG PO TABS: 1.0000 | ORAL_TABLET | ORAL | Status: DC | PRN

## 2015-09-09 MED ORDER — DIPHENHYDRAMINE HCL 50 MG/ML IJ SOLN: 12.5000 mg | INTRAMUSCULAR | Status: DC | PRN

## 2015-09-09 MED ORDER — PHENYLEPHRINE 40 MCG/ML (10ML) SYRINGE FOR IV PUSH (FOR BLOOD PRESSURE SUPPORT)
80.0000 ug | PREFILLED_SYRINGE | INTRAVENOUS | Status: DC | PRN
  Filled 2015-09-09: qty 2
  Filled 2015-09-09: qty 20

## 2015-09-09 MED ORDER — LIDOCAINE HCL (PF) 1 % IJ SOLN
30.0000 mL | INTRAMUSCULAR | Status: DC | PRN
  Filled 2015-09-09: qty 30

## 2015-09-09 MED ORDER — CITRIC ACID-SODIUM CITRATE 334-500 MG/5ML PO SOLN
30.0000 mL | ORAL | Status: DC | PRN
  Administered 2015-09-10: 30 mL via ORAL
  Filled 2015-09-09: qty 15

## 2015-09-09 MED ORDER — ACETAMINOPHEN 325 MG PO TABS: 650.0000 mg | ORAL_TABLET | ORAL | Status: DC | PRN

## 2015-09-09 MED ORDER — OXYTOCIN BOLUS FROM INFUSION: 500.0000 mL | INTRAVENOUS | Status: DC

## 2015-09-09 MED ORDER — OXYTOCIN 40 UNITS IN LACTATED RINGERS INFUSION - SIMPLE MED
1.0000 m[IU]/min | INTRAVENOUS | Status: DC
  Administered 2015-09-09: 2 m[IU]/min via INTRAVENOUS
  Filled 2015-09-09: qty 1000

## 2015-09-09 MED ORDER — LACTATED RINGERS IV SOLN
INTRAVENOUS | Status: DC
  Administered 2015-09-09: 125 mL/h via INTRAVENOUS

## 2015-09-09 MED ORDER — FENTANYL 2.5 MCG/ML BUPIVACAINE 1/10 % EPIDURAL INFUSION (WH - ANES)
14.0000 mL/h | INTRAMUSCULAR | Status: DC | PRN
  Administered 2015-09-09: 14 mL/h via EPIDURAL
  Filled 2015-09-09: qty 125

## 2015-09-09 MED ORDER — ONDANSETRON HCL 4 MG/2ML IJ SOLN: 4.0000 mg | Freq: Four times a day (QID) | INTRAMUSCULAR | Status: DC | PRN

## 2015-09-09 MED ORDER — SODIUM CHLORIDE 0.9 % IV SOLN
2.0000 g | Freq: Four times a day (QID) | INTRAVENOUS | Status: DC
  Administered 2015-09-09: 2 g via INTRAVENOUS
  Filled 2015-09-09 (×4): qty 2000

## 2015-09-09 MED ORDER — OXYTOCIN 40 UNITS IN LACTATED RINGERS INFUSION - SIMPLE MED: 62.5000 mL/h | INTRAVENOUS | Status: DC

## 2015-09-09 MED ORDER — TERBUTALINE SULFATE 1 MG/ML IJ SOLN
0.2500 mg | Freq: Once | INTRAMUSCULAR | Status: DC | PRN
  Filled 2015-09-09: qty 1

## 2015-09-09 MED ORDER — LACTATED RINGERS IV SOLN: 500.0000 mL | INTRAVENOUS | Status: DC | PRN

## 2015-09-09 NOTE — MAU Note (Signed)
Report called to [facility] on BS. Will go to 160 after IV and labs

## 2015-09-09 NOTE — H&P (Signed)
LABOR ADMISSION HISTORY AND PHYSICAL  Brenda Spencer is a 24 y.o. female [phone] with IUP at 7w6dby LMP presenting for PROM.  Ms. WMickelsenreports that about 7 hours ago she felt a gush of fluid after having intercourse. She continued to leak fluid over the next few hours, and decided to come to the MAU. She reports +FM, + contractions,  no VB, no blurry vision, headaches or peripheral edema, and RUQ pain.  She plans on bottle feeding. She request BTL for birth control and has already signed papers.  Dating: By LMP (12/18/14) --->  Estimated Date of Delivery: 09/24/15  Sono:    _0 , CWD, normal anatomy, cephalic presentation, longitudnial lie, 1772g, 43% EFW _1 , CWD, normal anatomy, cephalic presentation, longitudnial lie, 276g, 41% EFW   Prenatal History/Complications:  [facility]  Dating  LMP Blood type: A/POS/-- (03/17 1555)   Genetic Screen FISH > 1q21 deletion > declined amniocentesis Antibody:NEG (03/17 1555)  Anatomic [place] normal female. _2  Growth ultrasound 3rd trimester  Rubella: 2.36 (03/17 1555)immune  GTT Early:  92    Third trimester: 136  3 hr [phone] (nml is less than [phone] so third value is one point from being abnormal RPR: NON REAC (03/17 1555)   Flu vaccine  08/28/15 HBsAg: NEGATIVE (03/17 1555) neg  TDaP vaccine  07/16/15                                          HIV: NONREACTIVE (03/17 1555)   GBS      Positive in urine                                        GBS: Pos in urine  Contraception  BTL > consent signed (new form 06/12/15) Pap: Negative; GC/CT negx2  Baby Food  Bottle   Circumcision  Female fetus   [place]      Past Medical History: Past Medical History  Diagnosis Date  . Eczema   . CHF (congestive heart failure)     RESOLVED; HAD CARDIAC EVAL   . Congestive heart failure 2011    related to preeclampsia  . Hypothyroidism AGE 24    NO TX  .  Pregnancy induced hypertension 2011  . Preterm labor 2011  . Infection     [place]  . [place] 2009; [place]    INHALER PRN  . Vaginal Pap smear, abnormal     colpo  . Hyperthyroidism     Past Surgical History: Past Surgical History  Procedure Laterality Date  . Tonsillectomy    . Wisdom tooth extraction    . Tear duct probing    . Cholecystectomy      Obstetrical History: OB History    Gravida Para Term Preterm AB TAB SAB Ectopic Multiple Living   _3 Obstetric Comments   2011 PIH;IOL; PP CHF-HOSPITALIZED X 1 WEEK      Social History: Social History   Social History  . Marital Status: Married    Spouse Name: CHRISTOPHER  . Number of Children: 1  . Years of Education:  12   Occupational History  . HOMEMAKER    Social History Main Topics  . Smoking status: Never Smoker   . Smokeless tobacco: Never Used  . Alcohol Use: No  . Drug Use: No  . Sexual Activity:    Partners: Male    Birth Control/ Protection: None   Other Topics Concern  . None   Social History Narrative   3-4 cups of tea and soda a day     Family History: Family History  Problem Relation Age of Onset  . Anesthesia problems Neg Hx   . Other Neg Hx   . Asthma Brother   . Learning disabilities Brother   . Asthma Daughter   . Learning disabilities Daughter   . Seizures Daughter   . Learning disabilities Other   . Colon cancer Maternal Grandmother   . Cancer Maternal Grandmother     colon  . Chromosomal disorder Daughter     1q21.1 microdeletion  . Cataracts Daughter     congenital    Allergies: No Known Allergies  Prescriptions prior to admission  Medication Sig Dispense Refill Last Dose  . aspirin 81 MG chewable tablet Chew 81 mg by mouth at bedtime.   09/08/2015 at Unknown time  . ferrous sulfate 325 (65 FE) MG tablet Take 1 tablet (325 mg total) by mouth 2 (two) times daily with a meal. 60 tablet 3 09/08/2015 at Unknown time  .  Prenatal Vit-Fe Fumarate-FA (PRENATAL MULTIVITAMIN) TABS tablet Take 1 tablet by mouth daily.   09/08/2015 at Unknown time  . ranitidine (ZANTAC) 150 MG tablet Take 1 tablet (150 mg total) by mouth 2 (two) times daily. 60 tablet 3 09/08/2015 at Unknown time  . albuterol (PROVENTIL HFA;VENTOLIN HFA) 108 (90 BASE) MCG/ACT inhaler Inhale 2 puffs into the lungs every 6 (six) hours as needed for wheezing or shortness of breath.   rescue  . cyclobenzaprine (FLEXERIL) 10 MG tablet Take 1 tablet (10 mg total) by mouth 3 (three) times daily as needed for muscle spasms. (Patient not taking: Reported on 09/09/2015) 30 tablet 2 More than a month at Unknown time     Review of Systems   All systems reviewed and negative except as stated in HPI  BP 136/88 mmHg  Pulse 104  Temp(Src) 98.3 F (36.8 C) (Oral)  Resp 20  Ht $R'5\' 1"'ow$  (1.549 m)  Wt 204 lb (92.534 kg)  BMI 38.57 kg/m2  SpO2 99%  LMP 12/18/2014 General appearance: alert, cooperative and no distress Lungs: non-labored breathing Heart: regular rate  Abdomen: gravid, non-tender Presentation: cephalic Fetal monitoringBaseline: 155 bpm, Variability: Good {> 6 bpm) and Accelerations: Reactive Uterine activityFrequency: occasional, Duration: 10 seconds Dilation: 4 Effacement (%): 70 Station: -2 Exam by:: Alycia Rossetti RN   Prenatal Transfer Tool  Maternal Diabetes: No Genetic Screening: Abnormal:  Results: Other:pt and daughter have 1q21 deletion, however patient declined amnio for this pregnancy. Desires testing for baby after delivery. Maternal Ultrasounds/Referrals: Normal Fetal Ultrasounds or other Referrals:  None Maternal Substance Abuse:  No Significant Maternal Medications:  Meds include: Other: ASA for preE prophylaxis Significant Maternal Lab Results: None  Results for orders placed or performed during the hospital encounter of 09/09/15 (from the past 24 hour(s))  Urinalysis, Routine w reflex microscopic (not at [facility])   Collection  Time: 09/09/15  8:12 PM  Result Value Ref Range   Color, Urine YELLOW YELLOW   APPearance CLEAR CLEAR   Specific Gravity, Urine 1.025 1.005 - 1.030   pH 6.0  5.0 - 8.0   Glucose, UA NEGATIVE NEGATIVE mg/dL   Hgb urine dipstick TRACE (A) NEGATIVE   Bilirubin Urine NEGATIVE NEGATIVE   Ketones, ur 15 (A) NEGATIVE mg/dL   Protein, ur 30 (A) NEGATIVE mg/dL   Urobilinogen, UA 1.0 0.0 - 1.0 mg/dL   Nitrite NEGATIVE NEGATIVE   Leukocytes, UA SMALL (A) NEGATIVE  Urine microscopic-add on   Collection Time: 09/09/15  8:12 PM  Result Value Ref Range   Squamous Epithelial / LPF RARE RARE   WBC, UA 7-10 <3 WBC/hpf   RBC / HPF 3-6 <3 RBC/hpf   Bacteria, UA FEW (A) RARE  Fern Test   Collection Time: 09/09/15  9:05 PM  Result Value Ref Range   POCT Fern Test Positive = ruptured amniotic membanes   CBC   Collection Time: 09/09/15  9:20 PM  Result Value Ref Range   WBC 10.2 4.0 - 10.5 K/uL   RBC 3.77 (L) 3.87 - 5.11 MIL/uL   Hemoglobin 9.1 (L) 12.0 - 15.0 g/dL   HCT 29.1 (L) 36.0 - 46.0 %   MCV 77.2 (L) 78.0 - 100.0 fL   MCH 24.1 (L) 26.0 - 34.0 pg   MCHC 31.3 30.0 - 36.0 g/dL   RDW 17.3 (H) 11.5 - 15.5 %   Platelets 200 150 - 400 K/uL  Type and screen   Collection Time: 09/09/15  9:20 PM  Result Value Ref Range   ABO/RH(D) A POS    Antibody Screen NEG    Sample Expiration 09/12/2015     Patient Active Problem List   Diagnosis Date Noted  . Premature rupture of membranes 09/09/2015  . Preterm labor in third trimester without delivery   . Anemia affecting pregnancy 07/16/2015  . Uterine size-date discrepancy in third trimester, antepartum 07/09/2015  . Group B streptococcal infection during pregnancy 07/02/2015  . Obesity affecting pregnancy in second trimester, antepartum   . Chromosome 1 deletion syndrome   . Hx of preeclampsia, prior pregnancy, currently pregnant   . Parent of child with chromosome abnormality   . Supervision of high risk pregnancy, antepartum 02/28/2015  .  History of pregnancy induced hypertension 02/28/2015  . Second hand tobacco smoke exposure 03/09/2013  . S/P laparoscopic cholecystectomy 02/06/2013  . History of congestive heart failure 01/09/2013  . Mild persistent asthma 11/18/2012    Assessment: Brenda Spencer is a 24 y.o. [phone] at 46w6dhere for augmentation of labor for PROM.  #Labor:Begin pitocin #Hx CHF, apparently 2/2 postpartum cardiomyopathy - echo this pregnancy with preserved EF #Pain: Epidural #FWB: Category 1 #ID:  GBS positive - ordering ampicillin given multiparity and PROM #Anemia: H 9.1 on admission #MOF: Bottle #MOC:BTL (papers already signed)  AAdin Hector MD PGY-1 [facility] PontesFamily Medicine   OB FELLOW HISTORY AND PHYSICAL ATTESTATION  I have seen and examined this patient; I agree with above documentation in the resident's note.   NPatsy LagerWouk 09/10/2015, 12:19 AM

## 2015-09-09 NOTE — Telephone Encounter (Signed)
Brenda Spencer called Saturday 09/07/15 1pm and left a voice message that she was  Having really bad back pain / pressure and wasn't sure if it was back labor or just back pain. States she is 37-38 weeks and wants to know what should she do.  Per chart review it appears she came to MAU 09/08/15 for back pain and was released. Has ob fu scheduled 09/13/15.

## 2015-09-09 NOTE — MAU Note (Signed)
Pt presents complaining of a big gush of fluid at 1400 and is continuing to leak. Denies bleeding. Denies pain. Reports good fetal movement.

## 2015-09-10 ENCOUNTER — Encounter (HOSPITAL_COMMUNITY)
Admission: AD | Disposition: A | Discharge status: Home / Self Care | Payer: Self-pay | Source: Ambulatory Visit | Attending: Family Medicine

## 2015-09-10 ENCOUNTER — Encounter (HOSPITAL_COMMUNITY): Payer: Self-pay

## 2015-09-10 ENCOUNTER — Inpatient Hospital Stay (HOSPITAL_COMMUNITY): Payer: Medicaid Other | Admitting: Anesthesiology

## 2015-09-10 DIAGNOSIS — E669 Obesity, unspecified: Secondary | ICD-10-CM

## 2015-09-10 DIAGNOSIS — Z3A37 37 weeks gestation of pregnancy: Secondary | ICD-10-CM

## 2015-09-10 DIAGNOSIS — O99824 Streptococcus B carrier state complicating childbirth: Secondary | ICD-10-CM

## 2015-09-10 DIAGNOSIS — O4292 Full-term premature rupture of membranes, unspecified as to length of time between rupture and onset of labor: Secondary | ICD-10-CM

## 2015-09-10 DIAGNOSIS — O9902 Anemia complicating childbirth: Secondary | ICD-10-CM

## 2015-09-10 DIAGNOSIS — O99214 Obesity complicating childbirth: Secondary | ICD-10-CM

## 2015-09-10 DIAGNOSIS — D649 Anemia, unspecified: Secondary | ICD-10-CM

## 2015-09-10 DIAGNOSIS — Z302 Encounter for sterilization: Secondary | ICD-10-CM

## 2015-09-10 HISTORY — PX: TUBAL LIGATION: SHX77

## 2015-09-10 LAB — RPR: RPR Ser Ql: NONREACTIVE

## 2015-09-10 LAB — MRSA PCR SCREENING: MRSA by PCR: NEGATIVE

## 2015-09-10 SURGERY — LIGATION, FALLOPIAN TUBE, POSTPARTUM
Anesthesia: Epidural | Laterality: Bilateral

## 2015-09-10 MED ORDER — DIPHENHYDRAMINE HCL 25 MG PO CAPS: 25.0000 mg | ORAL_CAPSULE | Freq: Four times a day (QID) | ORAL | Status: DC | PRN

## 2015-09-10 MED ORDER — BUPIVACAINE HCL (PF) 0.25 % IJ SOLN
INTRAMUSCULAR | Status: DC | PRN
  Administered 2015-09-09 (×2): 4 mL via EPIDURAL

## 2015-09-10 MED ORDER — PROMETHAZINE HCL 25 MG/ML IJ SOLN: 6.2500 mg | INTRAMUSCULAR | Status: DC | PRN

## 2015-09-10 MED ORDER — LIDOCAINE-EPINEPHRINE (PF) 2 %-1:200000 IJ SOLN
INTRAMUSCULAR | Status: DC | PRN
  Administered 2015-09-09: 4 mL

## 2015-09-10 MED ORDER — WITCH HAZEL-GLYCERIN EX PADS: 1.0000 "application " | MEDICATED_PAD | CUTANEOUS | Status: DC | PRN

## 2015-09-10 MED ORDER — SODIUM BICARBONATE 8.4 % IV SOLN
INTRAVENOUS | Status: DC | PRN
  Administered 2015-09-10: 7 mL via EPIDURAL
  Administered 2015-09-10 (×2): 5 mL via EPIDURAL
  Administered 2015-09-10: 3 mL via EPIDURAL

## 2015-09-10 MED ORDER — MEPERIDINE HCL 25 MG/ML IJ SOLN: 6.2500 mg | INTRAMUSCULAR | Status: DC | PRN

## 2015-09-10 MED ORDER — FENTANYL CITRATE (PF) 100 MCG/2ML IJ SOLN
INTRAMUSCULAR | Status: AC
  Filled 2015-09-10: qty 4

## 2015-09-10 MED ORDER — PROPOFOL 500 MG/50ML IV EMUL
INTRAVENOUS | Status: DC | PRN
  Administered 2015-09-10: 50 ug/kg/min via INTRAVENOUS

## 2015-09-10 MED ORDER — SIMETHICONE 80 MG PO CHEW
80.0000 mg | CHEWABLE_TABLET | ORAL | Status: DC | PRN
  Administered 2015-09-10: 80 mg via ORAL
  Filled 2015-09-10: qty 1

## 2015-09-10 MED ORDER — ZOLPIDEM TARTRATE 5 MG PO TABS: 5.0000 mg | ORAL_TABLET | Freq: Every evening | ORAL | Status: DC | PRN

## 2015-09-10 MED ORDER — LIDOCAINE-EPINEPHRINE (PF) 2 %-1:200000 IJ SOLN
INTRAMUSCULAR | Status: AC
  Filled 2015-09-10: qty 20

## 2015-09-10 MED ORDER — OXYCODONE-ACETAMINOPHEN 5-325 MG PO TABS
2.0000 | ORAL_TABLET | ORAL | Status: DC | PRN
  Administered 2015-09-10 – 2015-09-12 (×7): 2 via ORAL
  Filled 2015-09-10 (×8): qty 2

## 2015-09-10 MED ORDER — TETANUS-DIPHTH-ACELL PERTUSSIS 5-2.5-18.5 LF-MCG/0.5 IM SUSP
0.5000 mL | Freq: Once | INTRAMUSCULAR | Status: DC
Start: 2015-09-11 — End: 2015-09-11

## 2015-09-10 MED ORDER — ONDANSETRON HCL 4 MG/2ML IJ SOLN: 4.0000 mg | INTRAMUSCULAR | Status: DC | PRN

## 2015-09-10 MED ORDER — FAMOTIDINE 20 MG PO TABS
20.0000 mg | ORAL_TABLET | Freq: Two times a day (BID) | ORAL | Status: DC | PRN
  Administered 2015-09-10: 20 mg via ORAL
  Filled 2015-09-10: qty 1

## 2015-09-10 MED ORDER — HYDROMORPHONE HCL 1 MG/ML IJ SOLN
0.2500 mg | INTRAMUSCULAR | Status: DC | PRN
  Administered 2015-09-10 (×2): 0.5 mg via INTRAVENOUS

## 2015-09-10 MED ORDER — PRENATAL MULTIVITAMIN CH
1.0000 | ORAL_TABLET | Freq: Every day | ORAL | Status: DC
  Administered 2015-09-11: 1 via ORAL
  Filled 2015-09-10: qty 1

## 2015-09-10 MED ORDER — OXYCODONE-ACETAMINOPHEN 5-325 MG PO TABS
1.0000 | ORAL_TABLET | ORAL | Status: DC | PRN
  Administered 2015-09-10 – 2015-09-11 (×3): 1 via ORAL
  Filled 2015-09-10 (×2): qty 1

## 2015-09-10 MED ORDER — FAMOTIDINE 20 MG PO TABS
40.0000 mg | ORAL_TABLET | Freq: Once | ORAL | Status: AC
  Administered 2015-09-10: 40 mg via ORAL
  Filled 2015-09-10: qty 2

## 2015-09-10 MED ORDER — BENZOCAINE-MENTHOL 20-0.5 % EX AERO
1.0000 "application " | INHALATION_SPRAY | CUTANEOUS | Status: DC | PRN
  Administered 2015-09-11: 1 via TOPICAL
  Filled 2015-09-10: qty 56

## 2015-09-10 MED ORDER — ONDANSETRON HCL 4 MG PO TABS
4.0000 mg | ORAL_TABLET | ORAL | Status: DC | PRN
  Administered 2015-09-10: 4 mg via ORAL
  Filled 2015-09-10: qty 1

## 2015-09-10 MED ORDER — MIDAZOLAM HCL 2 MG/2ML IJ SOLN
INTRAMUSCULAR | Status: AC
  Filled 2015-09-10: qty 4

## 2015-09-10 MED ORDER — BUPIVACAINE HCL (PF) 0.25 % IJ SOLN
INTRAMUSCULAR | Status: AC
  Filled 2015-09-10: qty 30

## 2015-09-10 MED ORDER — DIBUCAINE 1 % RE OINT: 1.0000 "application " | TOPICAL_OINTMENT | RECTAL | Status: DC | PRN

## 2015-09-10 MED ORDER — MIDAZOLAM HCL 2 MG/2ML IJ SOLN
INTRAMUSCULAR | Status: DC | PRN
  Administered 2015-09-10 (×2): 1 mg via INTRAVENOUS

## 2015-09-10 MED ORDER — MENTHOL 3 MG MT LOZG
1.0000 | LOZENGE | OROMUCOSAL | Status: DC | PRN
  Administered 2015-09-10: 3 mg via ORAL
  Filled 2015-09-10: qty 9

## 2015-09-10 MED ORDER — HYDROMORPHONE HCL 1 MG/ML IJ SOLN
INTRAMUSCULAR | Status: AC
  Administered 2015-09-10: 0.5 mg via INTRAVENOUS
  Filled 2015-09-10: qty 1

## 2015-09-10 MED ORDER — FENTANYL CITRATE (PF) 100 MCG/2ML IJ SOLN
INTRAMUSCULAR | Status: AC
  Filled 2015-09-10: qty 2

## 2015-09-10 MED ORDER — LANOLIN HYDROUS EX OINT: TOPICAL_OINTMENT | CUTANEOUS | Status: DC | PRN

## 2015-09-10 MED ORDER — CALCIUM CARBONATE ANTACID 500 MG PO CHEW
1.0000 | CHEWABLE_TABLET | Freq: Two times a day (BID) | ORAL | Status: DC | PRN
  Administered 2015-09-10: 200 mg via ORAL
  Filled 2015-09-10: qty 1

## 2015-09-10 MED ORDER — BUPIVACAINE HCL 0.25 % IJ SOLN
INTRAMUSCULAR | Status: DC | PRN
  Administered 2015-09-10: 10 mL

## 2015-09-10 MED ORDER — ACETAMINOPHEN 325 MG PO TABS: 650.0000 mg | ORAL_TABLET | ORAL | Status: DC | PRN

## 2015-09-10 MED ORDER — IBUPROFEN 600 MG PO TABS
600.0000 mg | ORAL_TABLET | Freq: Four times a day (QID) | ORAL | Status: DC
  Administered 2015-09-10 – 2015-09-12 (×7): 600 mg via ORAL
  Filled 2015-09-10 (×7): qty 1

## 2015-09-10 MED ORDER — SODIUM BICARBONATE 8.4 % IV SOLN
INTRAVENOUS | Status: AC
  Filled 2015-09-10: qty 50

## 2015-09-10 MED ORDER — METOCLOPRAMIDE HCL 10 MG PO TABS
10.0000 mg | ORAL_TABLET | Freq: Once | ORAL | Status: AC
  Administered 2015-09-10: 10 mg via ORAL
  Filled 2015-09-10: qty 1

## 2015-09-10 MED ORDER — KETOROLAC TROMETHAMINE 30 MG/ML IJ SOLN: 30.0000 mg | Freq: Once | INTRAMUSCULAR | Status: DC | PRN

## 2015-09-10 MED ORDER — SENNOSIDES-DOCUSATE SODIUM 8.6-50 MG PO TABS
2.0000 | ORAL_TABLET | ORAL | Status: DC
  Administered 2015-09-10: 2 via ORAL
  Filled 2015-09-10 (×3): qty 2

## 2015-09-10 MED ORDER — ONDANSETRON HCL 4 MG/2ML IJ SOLN
INTRAMUSCULAR | Status: AC
  Filled 2015-09-10: qty 2

## 2015-09-10 MED ORDER — FENTANYL CITRATE (PF) 100 MCG/2ML IJ SOLN
INTRAMUSCULAR | Status: DC | PRN
  Administered 2015-09-10 (×2): 50 ug via INTRAVENOUS

## 2015-09-10 MED ORDER — LACTATED RINGERS IV SOLN
INTRAVENOUS | Status: DC
  Administered 2015-09-10: 125 mL/h via INTRAVENOUS
  Administered 2015-09-10 (×2): via INTRAVENOUS

## 2015-09-10 SURGICAL SUPPLY — 19 items
CHLORAPREP W/TINT 26ML (MISCELLANEOUS) ×3 IMPLANT
CONTAINER PREFILL 10% NBF 15ML (MISCELLANEOUS) ×6 IMPLANT
DRSG OPSITE POSTOP 3X4 (GAUZE/BANDAGES/DRESSINGS) ×3 IMPLANT
GLOVE BIOGEL PI IND STRL 6.5 (GLOVE) ×1 IMPLANT
GLOVE BIOGEL PI INDICATOR 6.5 (GLOVE) ×2
GLOVE SURG SS PI 6.0 STRL IVOR (GLOVE) ×3 IMPLANT
GOWN STRL REUS W/TWL LRG LVL3 (GOWN DISPOSABLE) ×6 IMPLANT
NEEDLE HYPO 22GX1.5 SAFETY (NEEDLE) ×2 IMPLANT
NS IRRIG 1000ML POUR BTL (IV SOLUTION) ×3 IMPLANT
PACK ABDOMINAL MINOR (CUSTOM PROCEDURE TRAY) ×3 IMPLANT
SPONGE LAP 4X18 X RAY DECT (DISPOSABLE) ×2 IMPLANT
SUT PLAIN 0 NONE (SUTURE) ×3 IMPLANT
SUT VIC AB 0 CT1 27 (SUTURE) ×6
SUT VIC AB 0 CT1 27XBRD ANBCTR (SUTURE) ×1 IMPLANT
SUT VIC AB 3-0 PS2 18 (SUTURE) ×3 IMPLANT
SYR CONTROL 10ML LL (SYRINGE) ×2 IMPLANT
TOWEL OR 17X24 6PK STRL BLUE (TOWEL DISPOSABLE) ×6 IMPLANT
TRAY FOLEY CATH SILVER 14FR (SET/KITS/TRAYS/PACK) ×3 IMPLANT
WATER STERILE IRR 1000ML POUR (IV SOLUTION) ×3 IMPLANT

## 2015-09-10 NOTE — Progress Notes (Signed)
24 y.o. yo [phone]  with undesired fertility,status post vaginal delivery, desires permanent sterilization. Risks and benefits of procedure discussed with patient including permanence of method, bleeding, infection, injury to surrounding organs and need for additional procedures. Risk failure of 0.5-1% with increased risk of ectopic gestation if pregnancy occurs was also discussed with patient.

## 2015-09-10 NOTE — Transfer of Care (Signed)
Immediate Anesthesia Transfer of Care Note  Patient: Brenda Spencer  Procedure(s) Performed: Procedure(s): POST PARTUM TUBAL LIGATION (Bilateral)  Patient Location: PACU  Anesthesia Type:Epidural  Level of Consciousness: awake, alert  and oriented  Airway & Oxygen Therapy: Patient Spontanous Breathing and Patient connected to nasal cannula oxygen  Post-op Assessment: Report given to RN and Post -op Vital signs reviewed and stable  Post vital signs: Reviewed and stable  Last Vitals:  Filed Vitals:   09/10/15 1214  BP: 128/86  Pulse: 84  Temp: 36.9 C  Resp:     Complications: No apparent anesthesia complications

## 2015-09-10 NOTE — Op Note (Signed)
PREOPERATIVE DIAGNOSIS:  Undesired fertility  POSTOPERATIVE DIAGNOSIS:  Undesired fertility  PROCEDURE:  Postpartum Bilateral Tubal Sterilization using Pomeroy method   ANESTHESIA:  Epidural  COMPLICATIONS:  None immediate.  ESTIMATED BLOOD LOSS:  100cc.  FLUIDS: 600 cc LR.   INDICATIONS: 24 y.o. yo [id number]  with undesired fertility,status post vaginal delivery, desires permanent sterilization. Risks and benefits of procedure discussed with patient including permanence of method, bleeding, infection, injury to surrounding organs and need for additional procedures. Risk failure of 0.5-1% with increased risk of ectopic gestation if pregnancy occurs was also discussed with patient.   FINDINGS:  Normal uterus, tubes, and ovaries.  TECHNIQUE: After informed consent was obtained, the patient was taken to the operating room where anesthesia was induced and found to be adequate. A small transverse, infraumbilical skin incision was made with the scalpel. This incision was carried down to the underlying layer of fascia. The fascia was grasped with Kocher clamps tented up and entered sharply with Mayo scissors. Underlying peritoneum was then identified tented up and entered sharply with Metzenbaum scissors. The fascia was tagged with 0 Vicryl. The patient's left fallopian tube was then identified, brought to the incision, and grasped with a Babcock clamp. The tube was then followed out to the fimbria. The Babcock clamp was then used to grasp the tube approximately 4 cm from the cornual region. A 3 cm segment of the tube was then ligated with free tie of plain gut suture, transected and excised. Good hemostasis was noted and the tube was returned to the abdomen. The right fallopian tube was then identified to its fimbriated end, ligated, and a 3 cm segment excised in a similar fashion. Excellent hemostasis was noted, and the tube returned to the abdomen. The fascia was re-approximated with 0 Vicryl. The skin  was closed in a subcuticular fashion with 3-0 Vicryl. Quarter percent Marcaine solution was then injected at the incision site. The patient tolerated the procedure well. Sponge, lap, and needle count were correct x2. The patient was taken to recovery room in stable condition.

## 2015-09-10 NOTE — Progress Notes (Signed)
Pt reports left ear pain that was present when she woke up on the MBU unit this morning.  Mother also reports a sore throat and states pain from ear is giving her a headache.  Pt denies ear pain prior to admission and denies vision pain.  MD notified and RN given permission to give pt percocet.  Will continue to monitor.

## 2015-09-10 NOTE — Anesthesia Preprocedure Evaluation (Signed)
Anesthesia Evaluation  Patient identified by MRN, date of birth, ID band Patient awake    Reviewed: Allergy & Precautions, NPO status , Patient's Chart, lab work & pertinent test results  Airway Mallampati: I       Dental no notable dental hx.    Pulmonary    Pulmonary exam normal        Cardiovascular hypertension, Normal cardiovascular exam     Neuro/Psych    GI/Hepatic   Endo/Other    Renal/GU      Musculoskeletal   Abdominal Normal abdominal exam  (+)   Peds  Hematology   Anesthesia Other Findings   Reproductive/Obstetrics                             Anesthesia Physical Anesthesia Plan  ASA: II  Anesthesia Plan: Epidural   Post-op Pain Management:    Induction:   Airway Management Planned:   Additional Equipment:   Intra-op Plan:   Post-operative Plan:   Informed Consent:   Plan Discussed with: Anesthesiologist, CRNA and Surgeon  Anesthesia Plan Comments:         Anesthesia Quick Evaluation

## 2015-09-10 NOTE — Anesthesia Preprocedure Evaluation (Signed)
Anesthesia Evaluation  Patient identified by MRN, date of birth, ID band Patient awake    Reviewed: Allergy & Precautions, NPO status , Patient's Chart, lab work & pertinent test results  History of Anesthesia Complications Negative for: history of anesthetic complications  Airway Mallampati: II  TM Distance: >3 FB Neck ROM: Full    Dental  (+) Teeth Intact   Pulmonary asthma ,    breath sounds clear to auscultation       Cardiovascular negative cardio ROS   Rhythm:Regular     Neuro/Psych negative neurological ROS  negative psych ROS   GI/Hepatic Neg liver ROS, GERD  Medicated and Controlled,  Endo/Other  Morbid obesity  Renal/GU negative Renal ROS     Musculoskeletal   Abdominal   Peds  Hematology  (+) anemia ,   Anesthesia Other Findings   Reproductive/Obstetrics                             Anesthesia Physical Anesthesia Plan  ASA: II  Anesthesia Plan: Epidural   Post-op Pain Management:    Induction:   Airway Management Planned:   Additional Equipment:   Intra-op Plan:   Post-operative Plan:   Informed Consent: I have reviewed the patients History and Physical, chart, labs and discussed the procedure including the risks, benefits and alternatives for the proposed anesthesia with the patient or authorized representative who has indicated his/her understanding and acceptance.     Plan Discussed with: Anesthesiologist  Anesthesia Plan Comments:         Anesthesia Quick Evaluation

## 2015-09-10 NOTE — Anesthesia Postprocedure Evaluation (Signed)
Anesthesia Post Note  Patient: Brenda Spencer  Procedure(s) Performed: * No procedures listed *  Anesthesia type: Epidural  Patient location: Mother/Baby  Post pain: Pain level controlled  Post assessment: Post-op Vital signs reviewed  Last Vitals:  Filed Vitals:   09/10/15 1214  BP: 128/86  Pulse: 84  Temp: 36.9 C  Resp:     Post vital signs: Reviewed  Level of consciousness:alert  Complications: No apparent anesthesia complications

## 2015-09-10 NOTE — Anesthesia Procedure Notes (Signed)
Epidural Patient location during procedure: OB  Staffing Anesthesiologist: MOSER, CHRISTOPHER Performed by: anesthesiologist   Preanesthetic Checklist Completed: patient identified, surgical consent, pre-op evaluation, timeout performed, IV checked, risks and benefits discussed and monitors and equipment checked  Epidural Patient position: sitting Prep: DuraPrep Patient monitoring: heart rate, cardiac monitor, continuous pulse ox and blood pressure Approach: midline Location: L3-L4 Injection technique: LOR saline  Needle:  Needle type: Tuohy  Needle gauge: 17 G Needle length: 9 cm Needle insertion depth: 7 cm Catheter type: closed end flexible Catheter size: 19 Gauge Catheter at skin depth: 13 cm Test dose: negative and 2% lidocaine with Epi 1:200 K  Assessment Events: blood not aspirated, injection not painful, no injection resistance, negative IV test and no paresthesia  Additional Notes Reason for block:procedure for pain

## 2015-09-10 NOTE — Anesthesia Postprocedure Evaluation (Signed)
Anesthesia Post Note  Patient: Brenda Spencer  Procedure(s) Performed: Procedure(s) (LRB): POST PARTUM TUBAL LIGATION (Bilateral)  Anesthesia type: Epidural  Patient location: Mother/Baby  Post pain: Pain level controlled  Post assessment: Post-op Vital signs reviewed  Last Vitals:  Filed Vitals:   09/10/15 1958  BP: 124/62  Pulse: 89  Temp: 36.6 C  Resp: 18    Post vital signs: Reviewed  Level of consciousness: awake  Complications: No apparent anesthesia complications

## 2015-09-11 ENCOUNTER — Encounter (HOSPITAL_COMMUNITY): Payer: Self-pay | Admitting: Obstetrics and Gynecology

## 2015-09-11 MED ORDER — IBUPROFEN 600 MG PO TABS: 600.0000 mg | ORAL_TABLET | Freq: Four times a day (QID) | ORAL | Status: DC

## 2015-09-11 MED ORDER — OXYCODONE-ACETAMINOPHEN 5-325 MG PO TABS: 1.0000 | ORAL_TABLET | ORAL | Status: DC | PRN

## 2015-09-11 NOTE — Discharge Summary (Signed)
OB Discharge Summary  Patient Name: Brenda Spencer DOB: [birth year] MRN: [id number]  Date of admission: 09/09/2015 Delivering MD: Verner Mould   Date of discharge: 09/11/2015  Admitting diagnosis: 37 W LEAKING FLUID desired sterilization Intrauterine pregnancy: [redacted]w[redacted]d     Secondary diagnosis: None     Discharge diagnosis: Term Pregnancy Delivered                                                                                                Post partum procedures:postpartum tubal ligation  Augmentation: Pitocin  Complications: None  Hospital course:  Onset of Labor With Vaginal Delivery     24 y.o. yo [id number] at [redacted]w[redacted]d was admitted in Latent Laboron 09/09/2015. Patient had an uncomplicated labor course as follows:  Membrane Rupture Time/Date: 2:00 PM ,09/09/2015   Intrapartum Procedures: Episiotomy: None [1]                                         Lacerations:  None [1]  Mediations and procedures used include: Pitocin  Patient had a delivery of a Viable infant. 09/10/2015  Information for the patient's newborn:  Lamoyne, Palencia [[id number]]  Delivery Method: Vaginal, Spontaneous Delivery (Filed from Delivery Summary)    Pateint had an uncomplicated postpartum course.  She is ambulating, tolerating a regular diet, passing flatus, and urinating well. Patient is discharged home in stable condition on No discharge date for patient encounter.Marland Kitchen    Physical exam  Filed Vitals:   09/10/15 1527 09/10/15 1958 09/10/15 2334 09/11/15 0437  BP: 143/85 124/62 119/67 137/81  Pulse: 92 89 84 81  Temp: 98.7 F (37.1 C) 97.8 F (36.6 C) 98.1 F (36.7 C) 99.1 F (37.3 C)  TempSrc: Oral Axillary Oral Oral  Resp: 20 18 18 16   Height:      Weight:      SpO2: 97%      General: alert, cooperative and no distress Uterine Fundus: firm Incision: Dressing is clean, dry, and intact DVT Evaluation: No evidence of DVT seen on physical exam. Labs: Lab Results   Component Value Date   WBC 10.2 09/09/2015   HGB 9.1* 09/09/2015   HCT 29.1* 09/09/2015   MCV 77.2* 09/09/2015   PLT 200 09/09/2015   CMP Latest Ref Rng 09/08/2015  Glucose 65 - 99 mg/dL 91  BUN 6 - 20 mg/dL 7  Creatinine 0.44 - 1.00 mg/dL 0.52  Sodium 135 - 145 mmol/L 135  Potassium 3.5 - 5.1 mmol/L 3.9  Chloride 101 - 111 mmol/L 103  CO2 22 - 32 mmol/L 22  Calcium 8.9 - 10.3 mg/dL 8.4(L)  Total Protein 6.5 - 8.1 g/dL 6.8  Total Bilirubin 0.3 - 1.2 mg/dL 0.5  Alkaline Phos 38 - 126 U/L 173(H)  AST 15 - 41 U/L 14(L)  ALT 14 - 54 U/L 10(L)    Discharge instruction: per After Visit Summary and "Baby and Me Booklet".  Medications:  Current facility-administered medications:  .  acetaminophen (TYLENOL) tablet  650 mg, 650 mg, Oral, Q4H PRN, Verner Mould, MD .  benzocaine-Menthol (DERMOPLAST) 20-0.5 % topical spray 1 application, 1 application, Topical, PRN, Verner Mould, MD .  calcium carbonate (TUMS - dosed in mg elemental calcium) chewable tablet 200 mg of elemental calcium, 1 tablet, Oral, BID PRN, Francene Boyers, MD, 200 mg of elemental calcium at 09/10/15 1956 .  witch hazel-glycerin (TUCKS) pad 1 application, 1 application, Topical, PRN **AND** dibucaine (NUPERCAINAL) 1 % rectal ointment 1 application, 1 application, Rectal, PRN, Verner Mould, MD .  diphenhydrAMINE (BENADRYL) capsule 25 mg, 25 mg, Oral, Q6H PRN, Verner Mould, MD .  famotidine (PEPCID) tablet 20 mg, 20 mg, Oral, BID PRN, Francene Boyers, MD, 20 mg at 09/10/15 1956 .  ibuprofen (ADVIL,MOTRIN) tablet 600 mg, 600 mg, Oral, 4 times per day, Verner Mould, MD, 600 mg at 09/11/15 0600 .  lactated ringers infusion, , Intravenous, Continuous, Tanna Savoy Stinson, DO, Stopped at 09/10/15 1600 .  lanolin ointment, , Topical, PRN, Verner Mould, MD .  menthol-cetylpyridinium (CEPACOL) lozenge 3 mg, 1 lozenge, Oral, PRN, Francene Boyers, MD, 3 mg at  09/10/15 1957 .  ondansetron (ZOFRAN) tablet 4 mg, 4 mg, Oral, Q4H PRN, 4 mg at 09/10/15 0603 **OR** ondansetron (ZOFRAN) injection 4 mg, 4 mg, Intravenous, Q4H PRN, Verner Mould, MD .  oxyCODONE-acetaminophen (PERCOCET/ROXICET) 5-325 MG per tablet 1 tablet, 1 tablet, Oral, Q4H PRN, Verner Mould, MD, 1 tablet at 09/10/15 1041 .  oxyCODONE-acetaminophen (PERCOCET/ROXICET) 5-325 MG per tablet 2 tablet, 2 tablet, Oral, Q4H PRN, Verner Mould, MD, 2 tablet at 09/11/15 0600 .  prenatal multivitamin tablet 1 tablet, 1 tablet, Oral, Q1200, Verner Mould, MD, 1 tablet at 09/10/15 1141 .  senna-docusate (Senokot-S) tablet 2 tablet, 2 tablet, Oral, Q24H, Verner Mould, MD, 2 tablet at 09/10/15 2324 .  simethicone (MYLICON) chewable tablet 80 mg, 80 mg, Oral, PRN, Verner Mould, MD, 80 mg at 09/10/15 2324 .  Tdap (BOOSTRIX) injection 0.5 mL, 0.5 mL, Intramuscular, Once, Verner Mould, MD .  zolpidem [facility]) tablet 5 mg, 5 mg, Oral, QHS PRN, Verner Mould, MD  Facility-Administered Medications Ordered in Other Encounters:  .  bupivacaine (PF) (MARCAINE) 0.25 % injection, , , Anesthesia Intra-op, Oleta Mouse, MD, 4 mL at 09/09/15 2240 .  lidocaine-EPINEPHrine (XYLOCAINE W/EPI) 2 %-1:200000 (PF) injection, , Other, Anesthesia Intra-op, Oleta Mouse, MD, 4 mL at 09/09/15 2233  Diet: routine diet  Activity: Advance as tolerated. Pelvic rest for 6 weeks.   Outpatient follow up:6 weeks  Postpartum contraception: Tubal Ligation  Newborn Data: Live born female  Birth Weight: 6 lb 7 oz (2920 g) APGAR: 9, 10  Baby Feeding: Bottle Disposition:home with mother   09/11/2015 Alice Rieger, Med Student

## 2015-09-11 NOTE — Addendum Note (Signed)
Addendum  created 09/11/15 0759 by Talbot Grumbling, CRNA   Modules edited: Notes Section   Notes Section:  File: [id number]

## 2015-09-11 NOTE — Discharge Summary (Signed)
OB Discharge Summary  Patient Name: Brenda Spencer DOB: [birth year] MRN: [id number]  Date of admission: 09/09/2015 Delivering MD: Verner Mould   Date of discharge: 09/11/2015  Admitting diagnosis: 37 W LEAKING FLUID desired sterilization Intrauterine pregnancy: [redacted]w[redacted]d  Secondary diagnosis: None  Discharge diagnosis: Term Pregnancy Delivered    Post partum procedures:postpartum tubal ligation  Augmentation: Pitocin  Complications: None  Hospital course: Onset of Labor With Vaginal Delivery 24 y.o. yo [id number] at [redacted]w[redacted]d was admitted in Latent Laboron 09/09/2015. Patient had an uncomplicated labor course as follows:  Membrane Rupture Time/Date: 2:00 PM ,09/09/2015   Intrapartum Procedures: Episiotomy: None [1]   Lacerations: None [1]  Mediations and procedures used include: Pitocin Patient had a delivery of a Viable infant. 09/10/2015  Information for the patient's newborn:  Detra, Bores [[id number]]  Delivery Method: Vaginal, Spontaneous Delivery (Filed from Delivery Summary)    Pateint had an uncomplicated postpartum course. She did have an elective Bilateral Tubal Ligation yesterday.  She tolerated that well and only has minimal postoperative pain.    She is ambulating, tolerating a regular diet, passing flatus, and urinating well. Patient is discharged home in stable condition on No discharge date for patient encounter.Marland Kitchen    Physical exam  Filed Vitals:   09/10/15 1527 09/10/15 1958 09/10/15 2334 09/11/15 0437  BP: 143/85 124/62 119/67 137/81  Pulse: 92 89 84 81  Temp: 98.7 F (37.1 C) 97.8 F (36.6 C) 98.1 F (36.7 C) 99.1 F (37.3 C)  TempSrc: Oral Axillary Oral Oral  Resp: 20 18 18  16   Height:      Weight:      SpO2: 97%      General: alert, cooperative and no distress Uterine Fundus: firm Lochia within normal limits Abdomen soft and appropriately tender Incision: Dressing is clean, dry, and intact DVT Evaluation: No evidence of DVT seen on physical exam.  Labs:  Recent Labs    Lab Results  Component Value Date   WBC 10.2 09/09/2015   HGB 9.1* 09/09/2015   HCT 29.1* 09/09/2015   MCV 77.2* 09/09/2015   PLT 200 09/09/2015     CMP Latest Ref Rng 09/08/2015  Glucose 65 - 99 mg/dL 91  BUN 6 - 20 mg/dL 7  Creatinine 0.44 - 1.00 mg/dL 0.52  Sodium 135 - 145 mmol/L 135  Potassium 3.5 - 5.1 mmol/L 3.9  Chloride 101 - 111 mmol/L 103  CO2 22 - 32 mmol/L 22  Calcium 8.9 - 10.3 mg/dL 8.4(L)  Total Protein 6.5 - 8.1 g/dL 6.8  Total Bilirubin 0.3 - 1.2 mg/dL 0.5  Alkaline Phos 38 - 126 U/L 173(H)  AST 15 - 41 U/L 14(L)  ALT 14 - 54 U/L 10(L)    Discharge instruction: per After Visit Summary and "Baby and Me Booklet".  Medications:  Current facility-administered medications:  . acetaminophen (TYLENOL) tablet 650 mg, 650 mg, Oral, Q4H PRN, Verner Mould, MD . benzocaine-Menthol (DERMOPLAST) 20-0.5 % topical spray 1 application, 1 application, Topical, PRN, Verner Mould, MD . calcium carbonate (TUMS - dosed in mg elemental calcium) chewable tablet 200 mg of elemental calcium, 1 tablet, Oral, BID PRN, Francene Boyers, MD, 200 mg of elemental calcium at 09/10/15 1956 . witch hazel-glycerin (TUCKS) pad 1 application, 1 application, Topical, PRN **AND** dibucaine (NUPERCAINAL) 1 % rectal ointment 1 application, 1 application, Rectal, PRN, Verner Mould, MD . diphenhydrAMINE (BENADRYL) capsule 25 mg, 25 mg, Oral, Q6H PRN, Verner Mould, MD . famotidine (  PEPCID) tablet 20 mg, 20 mg, Oral, BID PRN, Francene Boyers, MD, 20 mg at 09/10/15  1956 . ibuprofen (ADVIL,MOTRIN) tablet 600 mg, 600 mg, Oral, 4 times per day, Verner Mould, MD, 600 mg at 09/11/15 0600 . lactated ringers infusion, , Intravenous, Continuous, Tanna Savoy Stinson, DO, Stopped at 09/10/15 1600 . lanolin ointment, , Topical, PRN, Verner Mould, MD . menthol-cetylpyridinium (CEPACOL) lozenge 3 mg, 1 lozenge, Oral, PRN, Francene Boyers, MD, 3 mg at 09/10/15 1957 . ondansetron (ZOFRAN) tablet 4 mg, 4 mg, Oral, Q4H PRN, 4 mg at 09/10/15 0603 **OR** ondansetron (ZOFRAN) injection 4 mg, 4 mg, Intravenous, Q4H PRN, Verner Mould, MD . oxyCODONE-acetaminophen (PERCOCET/ROXICET) 5-325 MG per tablet 1 tablet, 1 tablet, Oral, Q4H PRN, Verner Mould, MD, 1 tablet at 09/10/15 1041 . oxyCODONE-acetaminophen (PERCOCET/ROXICET) 5-325 MG per tablet 2 tablet, 2 tablet, Oral, Q4H PRN, Verner Mould, MD, 2 tablet at 09/11/15 0600 . prenatal multivitamin tablet 1 tablet, 1 tablet, Oral, Q1200, Verner Mould, MD, 1 tablet at 09/10/15 1141 . senna-docusate (Senokot-S) tablet 2 tablet, 2 tablet, Oral, Q24H, Verner Mould, MD, 2 tablet at 09/10/15 2324 . simethicone (MYLICON) chewable tablet 80 mg, 80 mg, Oral, PRN, Verner Mould, MD, 80 mg at 09/10/15 2324 . Tdap (BOOSTRIX) injection 0.5 mL, 0.5 mL, Intramuscular, Once, Verner Mould, MD . zolpidem [facility]) tablet 5 mg, 5 mg, Oral, QHS PRN, Verner Mould, MD  Facility-Administered Medications Ordered in Other Encounters:  . bupivacaine (PF) (MARCAINE) 0.25 % injection, , , Anesthesia Intra-op, Oleta Mouse, MD, 4 mL at 09/09/15 2240 . lidocaine-EPINEPHrine (XYLOCAINE W/EPI) 2 %-1:200000 (PF) injection, , Other, Anesthesia Intra-op, Oleta Mouse, MD, 4 mL at 09/09/15 2233  Diet: routine diet  Activity: Advance as tolerated. Pelvic rest for 6 weeks.   Outpatient follow up:6 weeks  Postpartum contraception:  Tubal Ligation  Newborn Data: Live born female  Birth Weight: 6 lb 7 oz (2920 g) APGAR: 9, 10  Baby Feeding: Bottle feeding Disposition:home with mother   09/11/2015 Hansel Feinstein CNM

## 2015-09-11 NOTE — Telephone Encounter (Signed)
Per chart review Brenda Spencer has came and delivered her baby.

## 2015-09-11 NOTE — Discharge Instructions (Signed)
Postpartum Tubal Ligation A postpartum tubal ligation (PPTL) is a procedure that blocks the fallopian tubes right after childbirth or 1-2 days after childbirth. PPTL is done before the uterus returns to its normal location. The procedure is also called a minilaparotomy. By blocking the fallopian tubes, the eggs that are released from the ovaries cannot enter the uterus and sperm cannot reach the egg. A PPTL is done so you will not be able to get pregnant or have a baby.  Although this procedure may be reversed, it should be considered permanent and irreversible. If you want to have future pregnancies, you should not have this procedure. LET YOUR CAREGIVER KNOW ABOUT:  Allergies to food or medicine.  Medicines taken, including vitamins, herbs, eyedrops, over-the-counter medicines, and creams.  Use of steroids (by mouth or creams).  Previous problems with numbing medicines.  History of bleeding problems or blood clots.  Any recent colds or infections.  Previous surgery.  Other health problems, including diabetes and kidney problems. RISKS AND COMPLICATIONS  Infection.  Bleeding.  Injury to other organs.  Anesthetic side effects.  Failure of the procedure.  Ectopic pregnancy.  Future regret about having the procedure done. BEFORE THE PROCEDURE   You may need to sign certain permission forms with your insurance up to 30 days before your due date.  After delivering your baby, you cannot eat or drink anything if the procedure is performed the same day. If you are having the procedure a day after delivering, you may be able to eat and drink until midnight. Your caregiver will give you specific directions depending on your situation. PROCEDURE   If done 1-2 days after delivery:  You will be given a medicine to make you sleep (general anesthetic) during the procedure.  A tube will be put down your throat to help your breath while under general anesthesia.  A small cut  (incision) is made just beneath the belly button.  The fallopian tubes are brought up through the incision.  The fallopian tubes are then sealed, tied, or cut.  If done after a caesarean delivery:  Tubal ligation is done through the incision already made (after the baby is delivered).  Once the tubes are blocked, the incision is closed with stitches (sutures). A bandage will be placed over the incisions. AFTER THE PROCEDURE  You may have some pain or cramps in the abdominal area for the next 3-7 days.  You will be given pain medicine to ease any discomfort.  You may also feel sick to your stomach if you were given a general anesthetic.  You may have some mild discomfort in the throat. This is from the tube that may have been placed in your throat while you were sleeping.  You may feel tired and should rest the remainder of the day. Document Released: 11/30/2005 Document Revised: 05/31/2012 Document Reviewed: 03/12/2012 [facility] Patient Information 2015 [place], [place]. This information is not intended to replace advice given to you by your health care provider. Make sure you discuss any questions you have with your health care provider. Postpartum Care After Vaginal Delivery After you deliver your newborn (postpartum period), the usual stay in the hospital is 24-72 hours. If there were problems with your labor or delivery, or if you have other medical problems, you might be in the hospital longer.  While you are in the hospital, you will receive help and instructions on how to care for yourself and your newborn during the postpartum period.  While you are  in the hospital:  Be sure to tell your nurses if you have pain or discomfort, as well as where you feel the pain and what makes the pain worse.  If you had an incision made near your vagina (episiotomy) or if you had some tearing during delivery, the nurses may put ice packs on your episiotomy or tear. The ice packs may help to reduce  the pain and swelling.  If you are breastfeeding, you may feel uncomfortable contractions of your uterus for a couple of weeks. This is normal. The contractions help your uterus get back to normal size.  It is normal to have some bleeding after delivery.  For the first 1-3 days after delivery, the flow is red and the amount may be similar to a period.  It is common for the flow to start and stop.  In the first few days, you may pass some small clots. Let your nurses know if you begin to pass large clots or your flow increases.  Do not  flush blood clots down the toilet before having the nurse look at them.  During the next 3-10 days after delivery, your flow should become more watery and pink or brown-tinged in color.  Ten to fourteen days after delivery, your flow should be a small amount of yellowish-white discharge.  The amount of your flow will decrease over the first few weeks after delivery. Your flow may stop in 6-8 weeks. Most women have had their flow stop by 12 weeks after delivery.  You should change your sanitary pads frequently.  Wash your hands thoroughly with soap and water for at least 20 seconds after changing pads, using the toilet, or before holding or feeding your newborn.  You should feel like you need to empty your bladder within the first 6-8 hours after delivery.  In case you become weak, lightheaded, or faint, call your nurse before you get out of bed for the first time and before you take a shower for the first time.  Within the first few days after delivery, your breasts may begin to feel tender and full. This is called engorgement. Breast tenderness usually goes away within 48-72 hours after engorgement occurs. You may also notice milk leaking from your breasts. If you are not breastfeeding, do not stimulate your breasts. Breast stimulation can make your breasts produce more milk.  Spending as much time as possible with your newborn is very important. During  this time, you and your newborn can feel close and get to know each other. Having your newborn stay in your room (rooming in) will help to strengthen the bond with your newborn. It will give you time to get to know your newborn and become comfortable caring for your newborn.  Your hormones change after delivery. Sometimes the hormone changes can temporarily cause you to feel sad or tearful. These feelings should not last more than a few days. If these feelings last longer than that, you should talk to your caregiver.  If desired, talk to your caregiver about methods of family planning or contraception.  Talk to your caregiver about immunizations. Your caregiver may want you to have the following immunizations before leaving the hospital:  Tetanus, diphtheria, and pertussis (Tdap) or tetanus and diphtheria (Td) immunization. It is very important that you and your family (including grandparents) or others caring for your newborn are up-to-date with the Tdap or Td immunizations. The Tdap or Td immunization can help protect your newborn from getting ill.  Rubella  immunization.  Varicella (chickenpox) immunization.  Influenza immunization. You should receive this annual immunization if you did not receive the immunization during your pregnancy. Document Released: 09/27/2007 Document Revised: 08/24/2012 Document Reviewed: 07/27/2012 [facility] Patient Information 2015 [place], [place]. This information is not intended to replace advice given to you by your health care provider. Make sure you discuss any questions you have with your health care provider.

## 2015-09-11 NOTE — Anesthesia Postprocedure Evaluation (Signed)
Anesthesia Post Note  Patient: Brenda Spencer  Procedure(s) Performed: Procedure(s) (LRB): POST PARTUM TUBAL LIGATION (Bilateral)  Anesthesia type: Epidural  Patient location: Mother/Baby  Post pain: Pain level controlled  Post assessment: Post-op Vital signs reviewed  Last Vitals:  Filed Vitals:   09/11/15 0437  BP: 137/81  Pulse: 81  Temp: 37.3 C  Resp: 16    Post vital signs: Reviewed  Level of consciousness: awake  Complications: No apparent anesthesia complications

## 2015-09-11 NOTE — Progress Notes (Signed)
UR chart review completed.  

## 2015-09-12 MED ORDER — CLINDAMYCIN HCL 300 MG PO CAPS
300.0000 mg | ORAL_CAPSULE | Freq: Once | ORAL | Status: DC
  Filled 2015-09-12: qty 1

## 2015-09-12 MED ORDER — CLINDAMYCIN HCL 300 MG PO CAPS: 300.0000 mg | ORAL_CAPSULE | Freq: Four times a day (QID) | ORAL | Status: DC

## 2015-09-12 NOTE — Discharge Summary (Signed)
OB Discharge Summary  Patient Name: Brenda Spencer DOB: Jun 28, 1991 MRN: [id number]  Date of admission: 09/09/2015 Delivering MD: Verner Mould   Date of discharge: 09/12/2015  Admitting diagnosis: 37 W LEAKING FLUID desired sterilization Intrauterine pregnancy: [redacted]w[redacted]d     Secondary diagnosis: None     Discharge diagnosis: Term Pregnancy Delivered                                                                                                Post partum procedures:postpartum tubal ligation  Augmentation: Pitocin  Complications: None  Hospital course:  Onset of Labor With Vaginal Delivery     24 y.o. yo [id number] at [redacted]w[redacted]d was admitted in Latent Laboron 09/09/2015. Patient had an uncomplicated labor course as follows:  Membrane Rupture Time/Date: 2:00 PM ,09/09/2015   Intrapartum Procedures: Episiotomy: None [1]                                         Lacerations:  None [1]  Mediations and procedures used include: Pitocin  Patient had a delivery of a Viable infant. 09/10/2015  Information for the patient's newborn:  Brenda, Spencer [[id number]]  Delivery Method: Vaginal, Spontaneous Delivery (Filed from Delivery Summary)     Patient's postpartum course complicated by redness surrounding the site of her btl incision site on ppd1. This thought to be a local reaction 2/2 the adhesive dressing, which was removed. She will be sent home with a prescription to clindamycin with instructions to start if redness not improving within 24 hours, and to f/u in clinic early next week if redness has not improved and has started antibiotics. We also return precautions should symptoms worsen despite antibiotics. She is ambulating, tolerating a regular diet, passing flatus, and urinating well. Patient is discharged home in stable condition on 9/29.    Physical exam  Filed Vitals:   09/11/15 0829 09/11/15 1231 09/11/15 1700 09/12/15 0652  BP: 126/81 139/89 120/85 136/82  Pulse:  93 89 100 88  Temp: 97.9 F (36.6 C)  98.6 F (37 C) 98.3 F (36.8 C)  TempSrc: Oral  Oral Oral  Resp: 18 16 18 17   Height:      Weight:      SpO2:       General: alert, cooperative and no distress  HEENT: Left ear tympanic membrane retracted. No effusion visible. Clear tympanic membranes.  Uterine Fundus: firm Incision: Dressing is clean, dry, and intact Skin: erythema surrounding and inferior to BTL incision site, which is c/d/i. No tenderness to palpation or induration.  DVT Evaluation: No evidence of DVT seen on physical exam.  Labs: Lab Results  Component Value Date   WBC 10.2 09/09/2015   HGB 9.1* 09/09/2015   HCT 29.1* 09/09/2015   MCV 77.2* 09/09/2015   PLT 200 09/09/2015   CMP Latest Ref Rng 09/08/2015  Glucose 65 - 99 mg/dL 91  BUN 6 - 20 mg/dL 7  Creatinine 0.44 - 1.00 mg/dL 0.52  Sodium 135 - 145  mmol/L 135  Potassium 3.5 - 5.1 mmol/L 3.9  Chloride 101 - 111 mmol/L 103  CO2 22 - 32 mmol/L 22  Calcium 8.9 - 10.3 mg/dL 8.4(L)  Total Protein 6.5 - 8.1 g/dL 6.8  Total Bilirubin 0.3 - 1.2 mg/dL 0.5  Alkaline Phos 38 - 126 U/L 173(H)  AST 15 - 41 U/L 14(L)  ALT 14 - 54 U/L 10(L)    Discharge instruction: per After Visit Summary and "Baby and Me Booklet".  Medications:  Current facility-administered medications:  .  acetaminophen (TYLENOL) tablet 650 mg, 650 mg, Oral, Q4H PRN, Verner Mould, MD .  benzocaine-Menthol (DERMOPLAST) 20-0.5 % topical spray 1 application, 1 application, Topical, PRN, Verner Mould, MD, 1 application at 91/47/82 2107 .  calcium carbonate (TUMS - dosed in mg elemental calcium) chewable tablet 200 mg of elemental calcium, 1 tablet, Oral, BID PRN, Francene Boyers, MD, 200 mg of elemental calcium at 09/10/15 1956 .  witch hazel-glycerin (TUCKS) pad 1 application, 1 application, Topical, PRN **AND** dibucaine (NUPERCAINAL) 1 % rectal ointment 1 application, 1 application, Rectal, PRN, Verner Mould, MD .   diphenhydrAMINE (BENADRYL) capsule 25 mg, 25 mg, Oral, Q6H PRN, Verner Mould, MD .  famotidine (PEPCID) tablet 20 mg, 20 mg, Oral, BID PRN, Francene Boyers, MD, 20 mg at 09/10/15 1956 .  ibuprofen (ADVIL,MOTRIN) tablet 600 mg, 600 mg, Oral, 4 times per day, Verner Mould, MD, 600 mg at 09/12/15 0542 .  lactated ringers infusion, , Intravenous, Continuous, Tanna Savoy Stinson, DO, Stopped at 09/10/15 1600 .  lanolin ointment, , Topical, PRN, Verner Mould, MD .  menthol-cetylpyridinium (CEPACOL) lozenge 3 mg, 1 lozenge, Oral, PRN, Francene Boyers, MD, 3 mg at 09/10/15 1957 .  ondansetron (ZOFRAN) tablet 4 mg, 4 mg, Oral, Q4H PRN, 4 mg at 09/10/15 0603 **OR** ondansetron (ZOFRAN) injection 4 mg, 4 mg, Intravenous, Q4H PRN, Verner Mould, MD .  oxyCODONE-acetaminophen (PERCOCET/ROXICET) 5-325 MG per tablet 1 tablet, 1 tablet, Oral, Q4H PRN, Verner Mould, MD, 1 tablet at 09/11/15 1235 .  oxyCODONE-acetaminophen (PERCOCET/ROXICET) 5-325 MG per tablet 2 tablet, 2 tablet, Oral, Q4H PRN, Verner Mould, MD, 2 tablet at 09/12/15 [phone] .  prenatal multivitamin tablet 1 tablet, 1 tablet, Oral, Q1200, Verner Mould, MD, 1 tablet at 09/11/15 1229 .  senna-docusate (Senokot-S) tablet 2 tablet, 2 tablet, Oral, Q24H, Verner Mould, MD, 2 tablet at 09/10/15 2324 .  simethicone (MYLICON) chewable tablet 80 mg, 80 mg, Oral, PRN, Verner Mould, MD, 80 mg at 09/10/15 2324 .  zolpidem (AMBIEN) tablet 5 mg, 5 mg, Oral, QHS PRN, Verner Mould, MD  Facility-Administered Medications Ordered in Other Encounters:  .  bupivacaine (PF) (MARCAINE) 0.25 % injection, , , Anesthesia Intra-op, Oleta Mouse, MD, 4 mL at 09/09/15 2240 .  lidocaine-EPINEPHrine (XYLOCAINE W/EPI) 2 %-1:200000 (PF) injection, , Other, Anesthesia Intra-op, Oleta Mouse, MD, 4 mL at 09/09/15 2233  Diet: routine diet  Activity: Advance as  tolerated. Pelvic rest for 6 weeks.   Outpatient follow up:6 weeks  Postpartum contraception: Tubal Ligation  Newborn Data: Live born female  Birth Weight: 6 lb 7 oz (2920 g) APGAR: 9, 10  Baby Feeding: Bottle Disposition:home with mother

## 2015-09-13 ENCOUNTER — Encounter: Payer: Medicaid Other | Admitting: Advanced Practice Midwife

## 2015-09-16 ENCOUNTER — Ambulatory Visit: Payer: Medicaid Other | Admitting: Family Medicine

## 2015-10-15 ENCOUNTER — Encounter: Payer: Self-pay | Admitting: Certified Nurse Midwife

## 2015-10-15 ENCOUNTER — Ambulatory Visit (INDEPENDENT_AMBULATORY_CARE_PROVIDER_SITE_OTHER): Payer: Medicaid Other | Admitting: Certified Nurse Midwife

## 2015-10-15 MED ORDER — AZITHROMYCIN 250 MG PO TABS: ORAL_TABLET | ORAL | Status: DC

## 2015-10-15 NOTE — Progress Notes (Signed)
C/o cold like symptoms today- non-productive cough, head congestion.   Patient ID: Brenda Spencer, female   DOB: 12/29/1990, 24 y.o.   MRN: [id number] Subjective:     [name] is a 24 y.o. female who presents for a postpartum visit. She is 5 weeks postpartum following a spontaneous vaginal delivery. I have fully reviewed the prenatal and intrapartum course. The delivery was at [redacted]w[redacted]d gestational weeks. Outcome: spontaneous vaginal delivery. Anesthesia: epidural. Postpartum course has been uneventful. Baby's course has been uneventful. Baby is feeding by bottle - Similac Neosure. Bleeding no bleeding. Bowel function is normal. Bladder function is normal. Patient is not sexually active. Contraception method is tubal ligation. Postpartum depression screening: negative.  The following portions of the patient's history were reviewed and updated as appropriate: past family history, past medical history, past social history and past surgical history.  Review of Systems Pertinent items are noted in HPI.   Objective:    BP 123/81 mmHg  Pulse 99  Temp(Src) 98.2 F (36.8 C)  Ht 5\' 1"  (1.549 m)  Wt 184 lb 3.2 oz (83.553 kg)  BMI 34.82 kg/m2  Breastfeeding? No  General:  alert and cooperative   Breasts:    Lungs: clear to auscultation bilaterally  Heart:  regular rate and rhythm, S1, S2 normal, no murmur, click, rub or gallop  Abdomen: soft, non-tender; bowel sounds normal; no masses,  no organomegaly   Vulva:    Vagina:   Cervix:    Corpus:   Adnexa:    Rectal Exam:         Assessment:    5 week postpartum exam. Pap smear not done at today's visit.  URI Plan:    1. Contraception: tubal ligation 2. Z Pack 3. Follow up in: 1 year or as needed.

## 2015-10-15 NOTE — Patient Instructions (Signed)
Upper Respiratory Infection, Adult Most upper respiratory infections (URIs) are a viral infection of the air passages leading to the lungs. A URI affects the nose, throat, and upper air passages. The most common type of URI is nasopharyngitis and is typically referred to as "the common cold." URIs run their course and usually go away on their own. Most of the time, a URI does not require medical attention, but sometimes a bacterial infection in the upper airways can follow a viral infection. This is called a secondary infection. Sinus and middle ear infections are common types of secondary upper respiratory infections. Bacterial pneumonia can also complicate a URI. A URI can worsen asthma and chronic obstructive pulmonary disease (COPD). Sometimes, these complications can require emergency medical care and may be life threatening.  CAUSES Almost all URIs are caused by viruses. A virus is a type of germ and can spread from one person to another.  RISKS FACTORS You may be at risk for a URI if:   You smoke.   You have chronic heart or lung disease.  You have a weakened defense (immune) system.   You are very young or very old.   You have nasal allergies or asthma.  You work in crowded or poorly ventilated areas.  You work in health care facilities or schools. SIGNS AND SYMPTOMS  Symptoms typically develop 2-3 days after you come in contact with a cold virus. Most viral URIs last 7-10 days. However, viral URIs from the influenza virus (flu virus) can last 14-18 days and are typically more severe. Symptoms may include:   Runny or stuffy (congested) nose.   Sneezing.   Cough.   Sore throat.   Headache.   Fatigue.   Fever.   Loss of appetite.   Pain in your forehead, behind your eyes, and over your cheekbones (sinus pain).  Muscle aches.  DIAGNOSIS  Your health care provider may diagnose a URI by:  Physical exam.  Tests to check that your symptoms are not due to  another condition such as:  Strep throat.  Sinusitis.  Pneumonia.  Asthma. TREATMENT  A URI goes away on its own with time. It cannot be cured with medicines, but medicines may be prescribed or recommended to relieve symptoms. Medicines may help:  Reduce your fever.  Reduce your cough.  Relieve nasal congestion. HOME CARE INSTRUCTIONS   Take medicines only as directed by your health care provider.   Gargle warm saltwater or take cough drops to comfort your throat as directed by your health care provider.  Use a warm mist humidifier or inhale steam from a shower to increase air moisture. This may make it easier to breathe.  Drink enough fluid to keep your urine clear or pale yellow.   Eat soups and other clear broths and maintain good nutrition.   Rest as needed.   Return to work when your temperature has returned to normal or as your health care provider advises. You may need to stay home longer to avoid infecting others. You can also use a face mask and careful hand washing to prevent spread of the virus.  Increase the usage of your inhaler if you have asthma.   Do not use any tobacco products, including cigarettes, chewing tobacco, or electronic cigarettes. If you need help quitting, ask your health care provider. PREVENTION  The best way to protect yourself from getting a cold is to practice good hygiene.   Avoid oral or hand contact with people with cold   symptoms.   Wash your hands often if contact occurs.  There is no clear evidence that vitamin C, vitamin E, echinacea, or exercise reduces the chance of developing a cold. However, it is always recommended to get plenty of rest, exercise, and practice good nutrition.  SEEK MEDICAL CARE IF:   You are getting worse rather than better.   Your symptoms are not controlled by medicine.   You have chills.  You have worsening shortness of breath.  You have brown or red mucus.  You have yellow or brown nasal  discharge.  You have pain in your face, especially when you bend forward.  You have a fever.  You have swollen neck glands.  You have pain while swallowing.  You have white areas in the back of your throat. SEEK IMMEDIATE MEDICAL CARE IF:   You have severe or persistent:  Headache.  Ear pain.  Sinus pain.  Chest pain.  You have chronic lung disease and any of the following:  Wheezing.  Prolonged cough.  Coughing up blood.  A change in your usual mucus.  You have a stiff neck.  You have changes in your:  Vision.  Hearing.  Thinking.  Mood. MAKE SURE YOU:   Understand these instructions.  Will watch your condition.  Will get help right away if you are not doing well or get worse.   This information is not intended to replace advice given to you by your health care provider. Make sure you discuss any questions you have with your health care provider.   Document Released: 05/26/2001 Document Revised: 04/16/2015 Document Reviewed: 03/07/2014 Elsevier Interactive Patient Education 2016 [employer]  

## 2015-10-16 ENCOUNTER — Ambulatory Visit: Payer: Medicaid Other | Admitting: Obstetrics & Gynecology

## 2015-11-19 ENCOUNTER — Encounter (HOSPITAL_COMMUNITY): Payer: Self-pay | Admitting: *Deleted

## 2015-12-12 ENCOUNTER — Emergency Department (HOSPITAL_COMMUNITY)
Admission: EM | Admit: 2015-12-12 | Discharge: 2015-12-12 | Disposition: A | Discharge status: Home / Self Care | Payer: Medicaid Other | Attending: Emergency Medicine | Admitting: Emergency Medicine

## 2015-12-12 ENCOUNTER — Encounter (HOSPITAL_COMMUNITY): Payer: Self-pay | Admitting: *Deleted

## 2015-12-12 DIAGNOSIS — Z79899 Other long term (current) drug therapy: Secondary | ICD-10-CM | POA: Diagnosis not present

## 2015-12-12 DIAGNOSIS — Z8639 Personal history of other endocrine, nutritional and metabolic disease: Secondary | ICD-10-CM | POA: Insufficient documentation

## 2015-12-12 DIAGNOSIS — H6691 Otitis media, unspecified, right ear: Secondary | ICD-10-CM | POA: Diagnosis not present

## 2015-12-12 DIAGNOSIS — Z87440 Personal history of urinary (tract) infections: Secondary | ICD-10-CM | POA: Insufficient documentation

## 2015-12-12 DIAGNOSIS — I509 Heart failure, unspecified: Secondary | ICD-10-CM | POA: Insufficient documentation

## 2015-12-12 DIAGNOSIS — J45909 Unspecified asthma, uncomplicated: Secondary | ICD-10-CM | POA: Insufficient documentation

## 2015-12-12 DIAGNOSIS — R05 Cough: Secondary | ICD-10-CM | POA: Diagnosis not present

## 2015-12-12 DIAGNOSIS — Z8619 Personal history of other infectious and parasitic diseases: Secondary | ICD-10-CM | POA: Insufficient documentation

## 2015-12-12 DIAGNOSIS — H6091 Unspecified otitis externa, right ear: Secondary | ICD-10-CM | POA: Insufficient documentation

## 2015-12-12 DIAGNOSIS — H9201 Otalgia, right ear: Secondary | ICD-10-CM | POA: Diagnosis present

## 2015-12-12 DIAGNOSIS — Z872 Personal history of diseases of the skin and subcutaneous tissue: Secondary | ICD-10-CM | POA: Diagnosis not present

## 2015-12-12 MED ORDER — AMOXICILLIN 875 MG PO TABS: 875.0000 mg | ORAL_TABLET | Freq: Two times a day (BID) | ORAL | Status: DC

## 2015-12-12 MED ORDER — CIPROFLOXACIN-DEXAMETHASONE 0.3-0.1 % OT SUSP: 4.0000 [drp] | Freq: Two times a day (BID) | OTIC | Status: DC

## 2015-12-12 NOTE — ED Provider Notes (Signed)
CSN: [id number]     Arrival date & time 12/12/15  1115 History   By signing my name below, I, [name], attest that this documentation has been prepared under the direction and in the presence of Carlisle Cater, PA-C. Electronically Signed: [name], ED Scribe. 12/12/2015. 12:09 PM.      Chief Complaint  Patient presents with  . Otalgia  . Cough    The history is provided by the patient. No language interpreter was used.   HPI Comments: Brenda Spencer is a 24 y.o. female who presents to the Emergency Department complaining of right ear pain onset 2 days prior. Pt reports associated drainage from the right ear and decreased hearing. Pt reports dry cough as well. Pt denies any medication PTA. Pt doesn't report any alleviating or worsening factors. Denies fever, congestion or rhinorrhea.   Past Medical History  Diagnosis Date  . Eczema   . CHF (congestive heart failure) (HCC)     RESOLVED; HAD CARDIAC EVAL   . Congestive heart failure ([place]) 2011    related to preeclampsia  . Hypothyroidism AGE 50     NO TX  . Pregnancy induced hypertension 2011  . Preterm labor 2011  . Infection     UTI OCC  . Infection 2013    [place]  . Asthma 2009; [facility]    INHALER PRN  . Vaginal Pap smear, abnormal     colpo  . Hyperthyroidism    Past Surgical History  Procedure Laterality Date  . Tonsillectomy    . Wisdom tooth extraction    . Tear duct probing    . Cholecystectomy    . Tubal ligation Bilateral 09/10/2015    Procedure: POST PARTUM TUBAL LIGATION;  Surgeon: Mora Bellman, MD;  Location: [facility];  Service: Gynecology;  Laterality: Bilateral;   Family History  Problem Relation Age of Onset  . Anesthesia problems Neg Hx   . Other Neg Hx   . Asthma Brother   . Learning disabilities Brother   . Asthma Daughter   . Learning disabilities Daughter   . Seizures Daughter   . Learning disabilities Other   . Colon cancer Maternal Grandmother   . Cancer Maternal  Grandmother     colon  . Chromosomal disorder Daughter     1q21.1 microdeletion  . Cataracts Daughter     congenital   Social History  Substance Use Topics  . Smoking status: Never Smoker   . Smokeless tobacco: Never Used  . Alcohol Use: No   OB History    Gravida Para Term Preterm AB TAB SAB Ectopic Multiple Living   '5 4 3 1 1  1  '$ 0 4      Obstetric Comments   2011 PIH;IOL; PP CHF-HOSPITALIZED X 1 WEEK     Review of Systems  Constitutional: Negative for fever, chills and fatigue.  HENT: Positive for ear discharge and ear pain. Negative for congestion, rhinorrhea, sinus pressure and sore throat.   Eyes: Negative for redness.  Respiratory: Positive for cough. Negative for wheezing.   Gastrointestinal: Negative for nausea, vomiting, abdominal pain and diarrhea.  Genitourinary: Negative for dysuria.  Musculoskeletal: Negative for myalgias and neck stiffness.  Skin: Negative for rash.  Neurological: Negative for headaches.  Hematological: Negative for adenopathy.      Allergies  Review of patient's allergies indicates no known allergies.  Home Medications   Prior to Admission medications   Medication Sig Start Date End Date Taking? Authorizing Provider  albuterol (  PROVENTIL HFA;VENTOLIN HFA) 108 (90 BASE) MCG/ACT inhaler Inhale 2 puffs into the lungs every 6 (six) hours as needed for wheezing or shortness of breath.    Historical Provider, MD  azithromycin (ZITHROMAX Z-PAK) 250 MG tablet Take as directed 10/15/15   Cecille Rubin A Clemmons, CNM  cyclobenzaprine (FLEXERIL) 10 MG tablet TAKE 1 TABLET BY MOUTH 3 TIMES A DAY AS NEEDED FOR MUSCLE SPASMS 08/11/15   Historical Provider, MD  ferrous sulfate 325 (65 FE) MG tablet Take 1 tablet (325 mg total) by mouth 2 (two) times daily with a meal. Patient not taking: Reported on 10/15/2015 08/29/15   Caren Macadam, MD  ibuprofen (ADVIL,MOTRIN) 600 MG tablet Take 1 tablet (600 mg total) by mouth every 6 (six) hours. 09/11/15   Seabron Spates, CNM   BP 129/81 mmHg  Pulse 102  Temp(Src) 97.7 F (36.5 C) (Oral)  Resp 16  SpO2 97%  LMP 11/21/2015 (Approximate)  Breastfeeding? No   Physical Exam  Constitutional: She is oriented to person, place, and time. She appears well-developed and well-nourished. No distress.  HENT:  Head: Normocephalic and atraumatic.  Right Ear: External ear normal.  Left Ear: Tympanic membrane, external ear and ear canal normal.  Nose: Nose normal. No mucosal edema or rhinorrhea.  Mouth/Throat: Uvula is midline, oropharynx is clear and moist and mucous membranes are normal. Mucous membranes are not dry. No oral lesions. No trismus in the jaw. No uvula swelling. No oropharyngeal exudate, posterior oropharyngeal edema, posterior oropharyngeal erythema or tonsillar abscesses.  Pain in the right ear with tugging on the pinna and with insertion of the speculum into the canal. Mild edema of the canal with debris. TM appears pain consistent with some layering of purulent material at the base.  Eyes: Conjunctivae and EOM are normal. Right eye exhibits no discharge. Left eye exhibits no discharge.  Neck: Normal range of motion. Neck supple. No tracheal deviation present.  Cardiovascular: Normal rate, regular rhythm and normal heart sounds.   Pulmonary/Chest: Effort normal and breath sounds normal. No respiratory distress. She has no wheezes. She has no rales.  Abdominal: Soft. There is no tenderness.  Musculoskeletal: Normal range of motion.  Lymphadenopathy:    She has no cervical adenopathy.  Neurological: She is alert and oriented to person, place, and time.  Skin: Skin is warm and dry.  Psychiatric: She has a normal mood and affect. Her behavior is normal.  Nursing note and vitals reviewed.   ED Course  Procedures (including critical care time) DIAGNOSTIC STUDIES: Oxygen Saturation is 97% on RA, normal by my interpretation.    COORDINATION OF CARE: 12:35 PM-Discussed treatment plan with pt  at bedside and pt agreed to plan.     Labs Review Labs Reviewed - No data to display  Imaging Review No results found.    EKG Interpretation None       Vital signs reviewed and are as follows: Filed Vitals:   12/12/15 1144  BP: 129/81  Pulse: 102  Temp: 97.7 F (36.5 C)  Resp: 16   Will treat as otitis externa, otitis media. Prescription for Ciprodex and amoxicillin given. Encouraged to return if symptoms worsen, if she developed a fever, or she has any other concerns. She verbalizes understanding and agrees with plan.  MDM   Final diagnoses:  Otitis externa, right  Acute right otitis media, recurrence not specified, unspecified otitis media type   Patient with ear pain: Pain and swelling of canal consistent with otitis externa however  TM is not normal and there is some layering of pus at the base of the TM posterior to TM. For this reason, patient treated with drops and oral antibiotics. Patient appears well, nontoxic. Lung sounds are clear and I do not suspect pneumonia as source of cough.  I personally performed the services described in this documentation, which was scribed in my presence. The recorded information has been reviewed and is accurate.       Carlisle Cater, PA-C 12/12/15 [place], MD 12/14/15 [phone]

## 2015-12-12 NOTE — ED Notes (Signed)
Pt reports right ear pain and non productive cough since Tuesday. Pain 8/10.

## 2015-12-12 NOTE — Discharge Instructions (Signed)
Please read and follow all provided instructions.  Your diagnoses today include:  1. Otitis externa, right   2. Acute right otitis media, recurrence not specified, unspecified otitis media type    Tests performed today include:  Vital signs. See below for your results today.   Medications prescribed:   Augmentin - antibiotic  You have been prescribed an antibiotic medicine: take the entire course of medicine even if you are feeling better. Stopping early can cause the antibiotic not to work.   Ciprodex ear drops - instill 4 drops into affected ear twice daily for 7 days  Take any prescribed medications only as directed. Treatment for your infection is aimed at treating the symptoms. There are no medications, such as antibiotics, that will cure your infection.   Home care instructions:  Follow any educational materials contained in this packet.   Your illness is contagious and can be spread to others, especially during the first 3 or 4 days. It cannot be cured by antibiotics or other medicines. Take basic precautions such as washing your hands often, covering your mouth when you cough or sneeze, and avoiding public places where you could spread your illness to others.   Please continue drinking plenty of fluids.  Use over-the-counter medicines as needed as directed on packaging for symptom relief.  You may also use ibuprofen or tylenol as directed on packaging for pain or fever.  Do not take multiple medicines containing Tylenol or acetaminophen to avoid taking too much of this medication.  Follow-up instructions: Please follow-up with your primary care provider in the next 3 days for further evaluation of your symptoms if you are not feeling better.   Return instructions:   Please return to the Emergency Department if you experience worsening symptoms.   RETURN IMMEDIATELY IF you develop shortness of breath, confusion or altered mental status, a new rash, become dizzy, faint, or  poorly responsive, or are unable to be cared for at home.  Please return if you have persistent vomiting and cannot keep down fluids or develop a fever that is not controlled by tylenol or motrin.    Please return if you have any other emergent concerns.  Additional Information:  Your vital signs today were: BP 129/81 mmHg   Pulse 102   Temp(Src) 97.7 F (36.5 C) (Oral)   Resp 16   SpO2 97%   LMP 11/21/2015 (Approximate)   Breastfeeding? No If your blood pressure (BP) was elevated above 135/85 this visit, please have this repeated by your doctor within one month. --------------

## 2015-12-17 ENCOUNTER — Encounter (HOSPITAL_COMMUNITY): Payer: Self-pay | Admitting: Emergency Medicine

## 2015-12-17 ENCOUNTER — Emergency Department (HOSPITAL_COMMUNITY)
Admission: EM | Admit: 2015-12-17 | Discharge: 2015-12-17 | Discharge status: Against Medical Advice | Payer: Medicaid Other | Attending: Emergency Medicine | Admitting: Emergency Medicine

## 2015-12-17 DIAGNOSIS — R51 Headache: Secondary | ICD-10-CM | POA: Insufficient documentation

## 2015-12-17 DIAGNOSIS — J45909 Unspecified asthma, uncomplicated: Secondary | ICD-10-CM | POA: Diagnosis not present

## 2015-12-17 DIAGNOSIS — I509 Heart failure, unspecified: Secondary | ICD-10-CM | POA: Diagnosis not present

## 2015-12-17 NOTE — ED Notes (Signed)
Attempted to call for patient in lobby and outside, no answer, will attempt again shortly
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
## 2015-12-20 ENCOUNTER — Encounter: Payer: Self-pay | Admitting: Pediatrics

## 2015-12-20 ENCOUNTER — Ambulatory Visit (INDEPENDENT_AMBULATORY_CARE_PROVIDER_SITE_OTHER): Payer: Medicaid Other | Admitting: Pediatrics

## 2015-12-20 VITALS — BP 124/79 | HR 93 | Temp 97.0°F | Ht 61.0 in | Wt 194.6 lb

## 2015-12-20 DIAGNOSIS — G44321 Chronic post-traumatic headache, intractable: Secondary | ICD-10-CM

## 2015-12-20 MED ORDER — RIZATRIPTAN BENZOATE 10 MG PO TBDP: 10.0000 mg | ORAL_TABLET | ORAL | Status: DC | PRN
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
## 2015-12-24 ENCOUNTER — Encounter (HOSPITAL_BASED_OUTPATIENT_CLINIC_OR_DEPARTMENT_OTHER): Payer: Self-pay

## 2015-12-24 ENCOUNTER — Emergency Department (HOSPITAL_BASED_OUTPATIENT_CLINIC_OR_DEPARTMENT_OTHER)
Admission: EM | Admit: 2015-12-24 | Discharge: 2015-12-25 | Disposition: A | Discharge status: Home / Self Care | Payer: Medicaid Other | Attending: Emergency Medicine | Admitting: Emergency Medicine

## 2015-12-24 DIAGNOSIS — R202 Paresthesia of skin: Secondary | ICD-10-CM | POA: Insufficient documentation

## 2015-12-24 DIAGNOSIS — G44321 Chronic post-traumatic headache, intractable: Secondary | ICD-10-CM | POA: Insufficient documentation

## 2015-12-24 DIAGNOSIS — Z872 Personal history of diseases of the skin and subcutaneous tissue: Secondary | ICD-10-CM | POA: Diagnosis not present

## 2015-12-24 DIAGNOSIS — Z8619 Personal history of other infectious and parasitic diseases: Secondary | ICD-10-CM | POA: Diagnosis not present

## 2015-12-24 DIAGNOSIS — J45909 Unspecified asthma, uncomplicated: Secondary | ICD-10-CM | POA: Insufficient documentation

## 2015-12-24 DIAGNOSIS — R51 Headache: Secondary | ICD-10-CM | POA: Diagnosis present

## 2015-12-24 DIAGNOSIS — Z79899 Other long term (current) drug therapy: Secondary | ICD-10-CM | POA: Insufficient documentation

## 2015-12-24 DIAGNOSIS — I509 Heart failure, unspecified: Secondary | ICD-10-CM | POA: Insufficient documentation

## 2015-12-24 DIAGNOSIS — Z8639 Personal history of other endocrine, nutritional and metabolic disease: Secondary | ICD-10-CM | POA: Diagnosis not present

## 2015-12-24 MED ORDER — METOCLOPRAMIDE HCL 5 MG/ML IJ SOLN
10.0000 mg | Freq: Once | INTRAMUSCULAR | Status: AC
  Administered 2015-12-24: 10 mg via INTRAVENOUS
  Filled 2015-12-24: qty 2

## 2015-12-24 MED ORDER — KETOROLAC TROMETHAMINE 15 MG/ML IJ SOLN
15.0000 mg | Freq: Once | INTRAMUSCULAR | Status: AC
  Administered 2015-12-24: 15 mg via INTRAVENOUS
  Filled 2015-12-24: qty 1

## 2015-12-24 MED ORDER — SODIUM CHLORIDE 0.9 % IV BOLUS (SEPSIS)
1000.0000 mL | Freq: Once | INTRAVENOUS | Status: AC
  Administered 2015-12-24: 1000 mL via INTRAVENOUS

## 2015-12-24 MED ORDER — DIPHENHYDRAMINE HCL 50 MG/ML IJ SOLN
25.0000 mg | Freq: Once | INTRAMUSCULAR | Status: AC
  Administered 2015-12-24: 25 mg via INTRAVENOUS
  Filled 2015-12-24: qty 1
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
## 2015-12-24 NOTE — ED Notes (Signed)
C/o HA " a year"-was seen by PCP and eye doctor last week-to be scheduled with neuro

## 2015-12-25 ENCOUNTER — Telehealth: Payer: Self-pay | Admitting: Pediatrics

## 2015-12-31 NOTE — Telephone Encounter (Signed)
Pt aware that she can call [facility] to see if earlier appmt

## 2016-01-02 ENCOUNTER — Other Ambulatory Visit: Payer: Self-pay | Admitting: Pediatrics

## 2016-01-02 DIAGNOSIS — G44321 Chronic post-traumatic headache, intractable: Secondary | ICD-10-CM

## 2016-01-03 ENCOUNTER — Emergency Department (HOSPITAL_COMMUNITY)
Admission: EM | Admit: 2016-01-03 | Discharge: 2016-01-03 | Disposition: A | Discharge status: Home / Self Care | Payer: Medicaid Other | Attending: Emergency Medicine | Admitting: Emergency Medicine

## 2016-01-03 ENCOUNTER — Telehealth: Payer: Self-pay | Admitting: Pediatrics

## 2016-01-03 ENCOUNTER — Encounter (HOSPITAL_COMMUNITY): Payer: Self-pay | Admitting: *Deleted

## 2016-01-03 DIAGNOSIS — Z872 Personal history of diseases of the skin and subcutaneous tissue: Secondary | ICD-10-CM | POA: Diagnosis not present

## 2016-01-03 DIAGNOSIS — G43809 Other migraine, not intractable, without status migrainosus: Secondary | ICD-10-CM | POA: Diagnosis not present

## 2016-01-03 DIAGNOSIS — Z8751 Personal history of pre-term labor: Secondary | ICD-10-CM | POA: Diagnosis not present

## 2016-01-03 DIAGNOSIS — J45909 Unspecified asthma, uncomplicated: Secondary | ICD-10-CM | POA: Insufficient documentation

## 2016-01-03 DIAGNOSIS — Z79899 Other long term (current) drug therapy: Secondary | ICD-10-CM | POA: Diagnosis not present

## 2016-01-03 DIAGNOSIS — Z8639 Personal history of other endocrine, nutritional and metabolic disease: Secondary | ICD-10-CM | POA: Insufficient documentation

## 2016-01-03 DIAGNOSIS — R519 Headache, unspecified: Secondary | ICD-10-CM

## 2016-01-03 DIAGNOSIS — Z87440 Personal history of urinary (tract) infections: Secondary | ICD-10-CM | POA: Insufficient documentation

## 2016-01-03 DIAGNOSIS — I509 Heart failure, unspecified: Secondary | ICD-10-CM | POA: Insufficient documentation

## 2016-01-03 DIAGNOSIS — R51 Headache: Secondary | ICD-10-CM | POA: Diagnosis present

## 2016-01-03 MED ORDER — KETOROLAC TROMETHAMINE 30 MG/ML IJ SOLN
30.0000 mg | Freq: Once | INTRAMUSCULAR | Status: AC
  Administered 2016-01-03: 30 mg via INTRAVENOUS
  Filled 2016-01-03: qty 1

## 2016-01-03 MED ORDER — DEXAMETHASONE SODIUM PHOSPHATE 10 MG/ML IJ SOLN
10.0000 mg | Freq: Once | INTRAMUSCULAR | Status: AC
  Administered 2016-01-03: 10 mg via INTRAVENOUS
  Filled 2016-01-03: qty 1

## 2016-01-03 MED ORDER — DIPHENHYDRAMINE HCL 50 MG/ML IJ SOLN
12.5000 mg | Freq: Once | INTRAMUSCULAR | Status: AC
  Administered 2016-01-03: 12.5 mg via INTRAVENOUS
  Filled 2016-01-03: qty 1

## 2016-01-03 MED ORDER — SODIUM CHLORIDE 0.9 % IV BOLUS (SEPSIS)
500.0000 mL | Freq: Once | INTRAVENOUS | Status: AC
  Administered 2016-01-03: 500 mL via INTRAVENOUS

## 2016-01-03 MED ORDER — METOCLOPRAMIDE HCL 5 MG/ML IJ SOLN
10.0000 mg | Freq: Once | INTRAMUSCULAR | Status: AC
  Administered 2016-01-03: 10 mg via INTRAVENOUS
  Filled 2016-01-03: qty 2
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
## 2016-01-03 NOTE — Discharge Instructions (Signed)
Migraine Headache A migraine headache is an intense, throbbing pain on one or both sides of your head. A migraine can last for 30 minutes to several hours. CAUSES  The exact cause of a migraine headache is not always known. However, a migraine may be caused when nerves in the brain become irritated and release chemicals that cause inflammation. This causes pain. Certain things may also trigger migraines, such as:  Alcohol.  Smoking.  Stress.  Menstruation.  Aged cheeses.  Foods or drinks that contain nitrates, glutamate, aspartame, or tyramine.  Lack of sleep.  Chocolate.  Caffeine.  Hunger.  Physical exertion.  Fatigue.  Medicines used to treat chest pain (nitroglycerine), birth control pills, estrogen, and some blood pressure medicines. SIGNS AND SYMPTOMS  Pain on one or both sides of your head.  Pulsating or throbbing pain.  Severe pain that prevents daily activities.  Pain that is aggravated by any physical activity.  Nausea, vomiting, or both.  Dizziness.  Pain with exposure to bright lights, loud noises, or activity.  General sensitivity to bright lights, loud noises, or smells. Before you get a migraine, you may get warning signs that a migraine is coming (aura). An aura may include:  Seeing flashing lights.  Seeing bright spots, halos, or zigzag lines.  Having tunnel vision or blurred vision.  Having feelings of numbness or tingling.  Having trouble talking.  Having muscle weakness. DIAGNOSIS  A migraine headache is often diagnosed based on:  Symptoms.  Physical exam.  A CT scan or MRI of your head. These imaging tests cannot diagnose migraines, but they can help rule out other causes of headaches. TREATMENT Medicines may be given for pain and nausea. Medicines can also be given to help prevent recurrent migraines.  HOME CARE INSTRUCTIONS  Only take over-the-counter or prescription medicines for pain or discomfort as directed by your  health care provider. The use of long-term narcotics is not recommended.  Lie down in a dark, quiet room when you have a migraine.  Keep a journal to find out what may trigger your migraine headaches. For example, write down:  What you eat and drink.  How much sleep you get.  Any change to your diet or medicines.  Limit alcohol consumption.  Quit smoking if you smoke.  Get 7-9 hours of sleep, or as recommended by your health care provider.  Limit stress.  Keep lights dim if bright lights bother you and make your migraines worse. SEEK IMMEDIATE MEDICAL CARE IF:   Your migraine becomes severe.  You have a fever.  You have a stiff neck.  You have vision loss.  You have muscular weakness or loss of muscle control.  You start losing your balance or have trouble walking.  You feel faint or pass out.  You have severe symptoms that are different from your first symptoms. MAKE SURE YOU:   Understand these instructions.  Will watch your condition.  Will get help right away if you are not doing well or get worse.   This information is not intended to replace advice given to you by your health care provider. Make sure you discuss any questions you have with your health care provider.  Keep scheduled appointment for MRI brain tomorrow. Follow-up with primary care provider for reevaluation. Continue taking home Maxalt and ibuprofen as needed for breakthrough headaches. Return to the emergency department if you experience severe worsening of her symptoms, neck pain or stiffness, fever, vomiting, numbness or tingling in your extremities, blurry vision.

## 2016-01-03 NOTE — ED Notes (Signed)
Pt reports severe and frequent migraines. Having nausea, no vomiting. Seen at [facility] on 1/10 for same. No acute distress noted.
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
## 2016-01-04 ENCOUNTER — Ambulatory Visit (HOSPITAL_BASED_OUTPATIENT_CLINIC_OR_DEPARTMENT_OTHER)
Admission: RE | Admit: 2016-01-04 | Discharge: 2016-01-04 | Disposition: A | Discharge status: Home / Self Care | Payer: Medicaid Other | Source: Ambulatory Visit | Attending: Pediatrics | Admitting: Pediatrics

## 2016-01-04 DIAGNOSIS — R110 Nausea: Secondary | ICD-10-CM | POA: Diagnosis not present

## 2016-01-04 DIAGNOSIS — R51 Headache: Secondary | ICD-10-CM | POA: Insufficient documentation

## 2016-01-04 DIAGNOSIS — G935 Compression of brain: Secondary | ICD-10-CM | POA: Insufficient documentation

## 2016-01-04 DIAGNOSIS — G44321 Chronic post-traumatic headache, intractable: Secondary | ICD-10-CM | POA: Insufficient documentation

## 2016-01-04 MED ORDER — GADOBENATE DIMEGLUMINE 529 MG/ML IV SOLN
18.0000 mL | Freq: Once | INTRAVENOUS | Status: DC | PRN
Start: 2016-01-04 — End: 2016-01-05

## 2016-01-06 ENCOUNTER — Telehealth: Payer: Self-pay | Admitting: Pediatrics

## 2016-01-06 ENCOUNTER — Ambulatory Visit (HOSPITAL_COMMUNITY): Payer: Medicaid Other

## 2016-01-06 DIAGNOSIS — G8929 Other chronic pain: Secondary | ICD-10-CM

## 2016-01-06 DIAGNOSIS — R51 Headache: Principal | ICD-10-CM

## 2016-01-06 NOTE — Telephone Encounter (Signed)
Stp and advised of md feedback, pt voiced understanding.

## 2016-01-06 NOTE — Telephone Encounter (Signed)
She can use OTC robitussin, keep water with her, lozenges, or a teaspoon of honey to help with cough. I talked with her this morning, I don't want to give her a medicine that makes her sleepy the way Rx cough medicine does with everything else going on. Also we require pts to be seen in clinic before getting that kind of medicine but I still think not the best medicine for her now.
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
## 2016-01-16 ENCOUNTER — Ambulatory Visit (INDEPENDENT_AMBULATORY_CARE_PROVIDER_SITE_OTHER): Payer: Medicaid Other | Admitting: Pediatrics

## 2016-01-16 ENCOUNTER — Encounter: Payer: Self-pay | Admitting: Pediatrics

## 2016-01-16 VITALS — BP 117/86 | HR 98 | Temp 97.4°F | Ht 60.0 in | Wt 193.6 lb

## 2016-01-16 DIAGNOSIS — G44319 Acute post-traumatic headache, not intractable: Secondary | ICD-10-CM

## 2016-01-16 DIAGNOSIS — R062 Wheezing: Secondary | ICD-10-CM

## 2016-01-16 DIAGNOSIS — Z7722 Contact with and (suspected) exposure to environmental tobacco smoke (acute) (chronic): Secondary | ICD-10-CM | POA: Diagnosis not present

## 2016-01-16 DIAGNOSIS — J069 Acute upper respiratory infection, unspecified: Secondary | ICD-10-CM

## 2016-01-16 MED ORDER — SPACER/AERO CHAMBER MOUTHPIECE MISC: Status: DC

## 2016-01-16 MED ORDER — ALBUTEROL SULFATE HFA 108 (90 BASE) MCG/ACT IN AERS: 2.0000 | INHALATION_SPRAY | Freq: Four times a day (QID) | RESPIRATORY_TRACT | Status: DC | PRN

## 2016-01-16 MED ORDER — GUAIFENESIN-CODEINE 100-10 MG/5ML PO SOLN: 5.0000 mL | Freq: Three times a day (TID) | ORAL | Status: DC | PRN
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
## 2016-01-19 ENCOUNTER — Encounter: Payer: Self-pay | Admitting: Pediatrics

## 2016-01-19 DIAGNOSIS — R062 Wheezing: Secondary | ICD-10-CM | POA: Insufficient documentation

## 2016-01-19 DIAGNOSIS — G44319 Acute post-traumatic headache, not intractable: Secondary | ICD-10-CM

## 2016-01-19 DIAGNOSIS — Z7722 Contact with and (suspected) exposure to environmental tobacco smoke (acute) (chronic): Secondary | ICD-10-CM | POA: Insufficient documentation

## 2016-01-19 HISTORY — DX: Acute post-traumatic headache, not intractable: G44.319

## 2016-01-20 ENCOUNTER — Ambulatory Visit (INDEPENDENT_AMBULATORY_CARE_PROVIDER_SITE_OTHER): Payer: Medicaid Other | Admitting: Neurology

## 2016-01-20 ENCOUNTER — Encounter: Payer: Self-pay | Admitting: Neurology

## 2016-01-20 VITALS — BP 136/66 | HR 98 | Ht 61.0 in | Wt 197.0 lb

## 2016-01-20 DIAGNOSIS — G935 Compression of brain: Secondary | ICD-10-CM | POA: Diagnosis not present

## 2016-01-20 DIAGNOSIS — G4441 Drug-induced headache, not elsewhere classified, intractable: Secondary | ICD-10-CM | POA: Diagnosis not present

## 2016-01-20 DIAGNOSIS — G932 Benign intracranial hypertension: Secondary | ICD-10-CM

## 2016-01-20 DIAGNOSIS — G4440 Drug-induced headache, not elsewhere classified, not intractable: Secondary | ICD-10-CM

## 2016-01-20 DIAGNOSIS — G43109 Migraine with aura, not intractable, without status migrainosus: Secondary | ICD-10-CM | POA: Diagnosis not present

## 2016-01-20 MED ORDER — ACETAZOLAMIDE ER 500 MG PO CP12: 500.0000 mg | ORAL_CAPSULE | Freq: Two times a day (BID) | ORAL | Status: DC

## 2016-01-20 NOTE — Patient Instructions (Signed)
1.  We will start acetazolamide 500mg  twice daily.  You may experience numbness and tingling, which is a side effect of the medication. 2.  Follow up with the eye doctor. 3.  I would like you to have another eye exam in 3 months and see me soon afterwards.
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
## 2016-01-21 ENCOUNTER — Other Ambulatory Visit: Payer: Self-pay

## 2016-01-22 ENCOUNTER — Telehealth: Payer: Self-pay | Admitting: Pediatrics

## 2016-01-22 NOTE — Telephone Encounter (Signed)
Returned phone call, use albuterol three times a day, needs to stay away from cigarette smoke, keep water with her, use lozenges. Come back in 2 weeks. May need to start controller med for breathing.

## 2016-01-22 NOTE — Telephone Encounter (Signed)
Patient states that she is coughing really bad and the cough medication is not helping.

## 2016-01-23 ENCOUNTER — Ambulatory Visit (HOSPITAL_COMMUNITY)
Admission: RE | Admit: 2016-01-23 | Discharge: 2016-01-23 | Disposition: A | Discharge status: Home / Self Care | Payer: Medicaid Other | Source: Ambulatory Visit | Attending: Pediatrics | Admitting: Pediatrics

## 2016-01-23 DIAGNOSIS — R51 Headache: Secondary | ICD-10-CM | POA: Diagnosis not present

## 2016-01-23 DIAGNOSIS — G935 Compression of brain: Secondary | ICD-10-CM | POA: Diagnosis not present

## 2016-01-23 DIAGNOSIS — H4710 Unspecified papilledema: Secondary | ICD-10-CM | POA: Insufficient documentation

## 2016-01-23 DIAGNOSIS — R519 Headache, unspecified: Secondary | ICD-10-CM

## 2016-02-05 ENCOUNTER — Ambulatory Visit: Payer: Medicaid Other | Admitting: Neurology

## 2016-02-13 ENCOUNTER — Ambulatory Visit (INDEPENDENT_AMBULATORY_CARE_PROVIDER_SITE_OTHER): Payer: Medicaid Other | Admitting: Pediatrics

## 2016-02-13 ENCOUNTER — Encounter: Payer: Self-pay | Admitting: Pediatrics

## 2016-02-13 VITALS — BP 132/88 | HR 82 | Temp 97.9°F | Ht 61.0 in | Wt 193.0 lb

## 2016-02-13 DIAGNOSIS — R51 Headache: Secondary | ICD-10-CM | POA: Diagnosis not present

## 2016-02-13 DIAGNOSIS — R519 Headache, unspecified: Secondary | ICD-10-CM

## 2016-02-13 DIAGNOSIS — Z6836 Body mass index (BMI) 36.0-36.9, adult: Secondary | ICD-10-CM

## 2016-02-13 DIAGNOSIS — Z638 Other specified problems related to primary support group: Secondary | ICD-10-CM

## 2016-02-13 DIAGNOSIS — R059 Cough, unspecified: Secondary | ICD-10-CM

## 2016-02-13 DIAGNOSIS — M545 Low back pain, unspecified: Secondary | ICD-10-CM

## 2016-02-13 DIAGNOSIS — R05 Cough: Secondary | ICD-10-CM

## 2016-02-13 DIAGNOSIS — F439 Reaction to severe stress, unspecified: Secondary | ICD-10-CM

## 2016-02-13 MED ORDER — CITALOPRAM HYDROBROMIDE 20 MG PO TABS: 20.0000 mg | ORAL_TABLET | Freq: Every day | ORAL | Status: DC

## 2016-02-13 MED ORDER — BENZONATATE 100 MG PO CAPS: 100.0000 mg | ORAL_CAPSULE | Freq: Two times a day (BID) | ORAL | Status: DC | PRN

## 2016-02-13 MED ORDER — CYCLOBENZAPRINE HCL 10 MG PO TABS: ORAL_TABLET | ORAL | Status: DC

## 2016-02-13 MED ORDER — IBUPROFEN 600 MG PO TABS: 600.0000 mg | ORAL_TABLET | Freq: Three times a day (TID) | ORAL | Status: DC | PRN

## 2016-02-13 NOTE — Progress Notes (Signed)
Subjective:    Patient ID: Brenda Spencer, female    DOB: Sep 10, 1991, 25 y.o.   MRN: [id number]  CC: Follow-up; Cough; and Back Pain   HPI: Brenda Spencer is a 25 y.o. female presenting for Follow-up; Cough; and Back Pain  Taking acetazolamide HAs are better, still with nagging HA but the severe HA are better Seen by a couple different ophthalmologist past two weeks, has L eye retinal tears that need laser surgery  Cough is better, but still present Trying to avoid tobacco smoke at home, family now smoking mostly in their rooms Does not want liquid cough medicine Has lower back pain, ongoing past 5-6 years. Trying to lose weight Has been walking, doing sit ups Feels stressed at home taking care of 8 cares, babysits for other family members as well Feels safe at home, but finds herself yelling a lot In parenting classes  No nausea or vomiting  Depression screen [facility] 2/9 02/13/2016 12/20/2015 07/31/2015 07/16/2015 07/10/2015  Decreased Interest 0 0 0 0 0  Down, Depressed, Hopeless 0 0 0 0 0  PHQ - 2 Score 0 0 0 0 0     Relevant past medical, surgical, family and social history reviewed and updated as indicated. Interim medical history since our last visit reviewed. Allergies and medications reviewed and updated.    ROS: Per HPI unless specifically indicated above  History  Smoking status  . Passive Smoke Exposure - Never Smoker  Smokeless tobacco  . Never Used    Past Medical History Patient Active Problem List   Diagnosis Date Noted  . Arnold-Chiari malformation, type I ([place]) 01/20/2016  . Exposure to second hand tobacco smoke 01/19/2016  . Acute post-traumatic headache, not intractable 01/19/2016  . Wheezing 01/19/2016  . Premature rupture of membranes 09/09/2015  . Preterm labor in third trimester without delivery   . Anemia affecting pregnancy 07/16/2015  . Uterine size-date discrepancy in third trimester, antepartum 07/09/2015  . Group B streptococcal  infection during pregnancy 07/02/2015  . Obesity affecting pregnancy in second trimester, antepartum   . Chromosome 1 deletion syndrome   . Hx of preeclampsia, prior pregnancy, currently pregnant   . Parent of child with chromosome abnormality   . Supervision of high risk pregnancy, antepartum 02/28/2015  . History of pregnancy induced hypertension 02/28/2015  . Second hand tobacco smoke exposure 03/09/2013  . S/P laparoscopic cholecystectomy 02/06/2013  . History of congestive heart failure 01/09/2013  . Mild persistent asthma 11/18/2012      Objective:    BP 132/88 mmHg  Pulse 82  Temp(Src) 97.9 F (36.6 C) (Oral)  Ht 5\' 1"  (1.549 m)  Wt 193 lb (87.544 kg)  BMI 36.49 kg/m2  Wt Readings from Last 3 Encounters:  02/13/16 193 lb (87.544 kg)  01/23/16 195 lb (88.451 kg)  01/20/16 197 lb (89.359 kg)    Gen: NAD, alert, cooperative with exam, NCAT EYES: EOMI, no scleral injection or icterus ENT:  TMs pearly gray b/l, OP without erythema, tongue varicosities top of tongue and below tongue LYMPH: no cervical LAD CV: NRRR, normal S1/S2, no murmur, distal pulses 2+ b/l Resp: CTABL, no wheezes, normal WOB Abd: +BS, soft, NTND.  Ext: No edema, warm Neuro: Alert and oriented, strength equal b/l UE and LE, coordination grossly normal MSK: normal muscle bulk     Assessment & Plan:    Brenda Spencer was seen today for follow-up, cough and back pain.  Diagnoses and all orders for this visit:  Nonintractable  headache, unspecified chronicity pattern, unspecified headache type Improving on medicine, treating for pseudotumor, followed by neuro  BMI 36.0-36.9,adult Discussed lifestyle changes Cutting out sodas Walking more  Midline low back pain without sciatica Gave gentle back exercises Continue lifestyle changes for weight loss -     cyclobenzaprine (FLEXERIL) 10 MG tablet; Take once a day as needed for back spasms -     ibuprofen (ADVIL,MOTRIN) 600 MG tablet; Take 1 tablet (600 mg  total) by mouth every 8 (eight) hours as needed.  Stress at home Feels safe at home, denies depression, ongoing stress. Discussed options, pt wants to start medicine. Want to keep her on it for 9 months if started, pt agrees. Will f/u in 4 weeks -     citalopram (CELEXA) 20 MG tablet; Take 1 tablet (20 mg total) by mouth daily.  Cough No wheezing. Can try tessalon pearles. Likely post-viral cough, also with RAD and still around smokers at home.  -     benzonatate (TESSALON) 100 MG capsule; Take 1 capsule (100 mg total) by mouth 2 (two) times daily as needed for cough.    Follow up plan: Return in about 4 weeks (around 03/12/2016) for med recheck.  [name], MD [facility] 02/13/2016, 8:34 AM

## 2016-02-13 NOTE — Patient Instructions (Signed)

## 2016-03-12 ENCOUNTER — Ambulatory Visit: Payer: Medicaid Other | Admitting: Pediatrics

## 2016-03-13 ENCOUNTER — Encounter: Payer: Self-pay | Admitting: Pediatrics

## 2016-03-18 ENCOUNTER — Ambulatory Visit: Payer: Medicaid Other | Admitting: Family Medicine

## 2016-03-19 ENCOUNTER — Encounter: Payer: Self-pay | Admitting: Pediatrics

## 2016-05-22 ENCOUNTER — Ambulatory Visit: Payer: Medicaid Other | Admitting: Neurology

## 2016-05-25 ENCOUNTER — Encounter: Payer: Self-pay | Admitting: Neurology

## 2016-05-25 ENCOUNTER — Ambulatory Visit (INDEPENDENT_AMBULATORY_CARE_PROVIDER_SITE_OTHER): Payer: Medicaid Other | Admitting: Neurology

## 2016-05-25 VITALS — BP 106/68 | HR 104 | Ht 61.0 in | Wt 204.0 lb

## 2016-05-25 DIAGNOSIS — G935 Compression of brain: Secondary | ICD-10-CM

## 2016-05-25 DIAGNOSIS — G4483 Primary cough headache: Secondary | ICD-10-CM

## 2016-05-25 DIAGNOSIS — G932 Benign intracranial hypertension: Secondary | ICD-10-CM

## 2016-05-25 NOTE — Patient Instructions (Addendum)
1.  I think the severe headaches are due to the Arnold-Chiari malformation.  Therefore, we will refer you to  Neurosurgery. 2.  Continue acetazolamide 500mg  twice daily for the pressure 3.  Follow up in 4.5 months (after seeing Dr. Kathlen Mody again)
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
## 2016-05-27 ENCOUNTER — Telehealth: Payer: Self-pay

## 2016-05-27 NOTE — Telephone Encounter (Signed)
Received fax from [facility], Pt is scheduled with Dr. Kathyrn Sheriff for 05/27/16 @ 1100 a.m.

## 2016-05-28 ENCOUNTER — Other Ambulatory Visit: Payer: Self-pay | Admitting: Neurosurgery

## 2016-06-04 ENCOUNTER — Encounter (HOSPITAL_COMMUNITY): Payer: Self-pay

## 2016-06-04 ENCOUNTER — Encounter (HOSPITAL_COMMUNITY)
Admission: RE | Admit: 2016-06-04 | Discharge: 2016-06-04 | Disposition: A | Discharge status: Home / Self Care | Payer: Medicaid Other | Source: Ambulatory Visit | Attending: Neurosurgery | Admitting: Neurosurgery

## 2016-06-04 DIAGNOSIS — Z01818 Encounter for other preprocedural examination: Secondary | ICD-10-CM | POA: Insufficient documentation

## 2016-06-04 DIAGNOSIS — Z01812 Encounter for preprocedural laboratory examination: Secondary | ICD-10-CM | POA: Diagnosis not present

## 2016-06-04 DIAGNOSIS — J45909 Unspecified asthma, uncomplicated: Secondary | ICD-10-CM | POA: Insufficient documentation

## 2016-06-04 DIAGNOSIS — Z79899 Other long term (current) drug therapy: Secondary | ICD-10-CM | POA: Insufficient documentation

## 2016-06-04 DIAGNOSIS — G935 Compression of brain: Secondary | ICD-10-CM | POA: Diagnosis not present

## 2016-06-04 HISTORY — DX: Dorsalgia, unspecified: M54.9

## 2016-06-04 HISTORY — DX: Other chronic pain: G89.29

## 2016-06-04 HISTORY — DX: Nocturia: R35.1

## 2016-06-04 LAB — CBC
HCT: 38.5 % (ref 36.0–46.0)
Hemoglobin: 12.4 g/dL (ref 12.0–15.0)
MCH: 27.5 pg (ref 26.0–34.0)
MCHC: 32.2 g/dL (ref 30.0–36.0)
MCV: 85.4 fL (ref 78.0–100.0)
PLATELETS: 286 10*3/uL (ref 150–400)
RBC: 4.51 MIL/uL (ref 3.87–5.11)
RDW: 14.5 % (ref 11.5–15.5)
WBC: 9 10*3/uL (ref 4.0–10.5)

## 2016-06-04 LAB — BASIC METABOLIC PANEL
Anion gap: 7 (ref 5–15)
BUN: 8 mg/dL (ref 6–20)
CHLORIDE: 105 mmol/L (ref 101–111)
CO2: 24 mmol/L (ref 22–32)
CREATININE: 0.53 mg/dL (ref 0.44–1.00)
Calcium: 8.7 mg/dL — ABNORMAL LOW (ref 8.9–10.3)
GFR calc non Af Amer: >60 mL/min (ref >60)
Glucose, Bld: 85 mg/dL (ref 65–99)
POTASSIUM: 3.8 mmol/L (ref 3.5–5.1)
SODIUM: 136 mmol/L (ref 135–145)

## 2016-06-04 LAB — HCG, SERUM, QUALITATIVE: Preg, Serum: NEGATIVE

## 2016-06-04 NOTE — Progress Notes (Signed)
Anesthesia Chart Review:  Pt is a 25 year old female scheduled for chiari decompression on 06/09/2016 with Consuella Lose.   PMH includes:  CHF (2011 due to preeclampsia, now recovered), asthma. Never smoker. BMI 38  Medications include: albuterol  Preoperative labs reviewed.    1 view CXR 08/06/15: No active disease  EKG 08/06/15: Sinus tachycardia (123 bpm) with occasional Premature ventricular complexes. Abnormal QRS-T angle, consider primary T wave abnormality. EKG was obtained for chest tightness in the setting of admission and treatment for preterm labor. No ongoing CV disease/problems, no sx reported at PAT. Will recheck EKG DOS.   Echo 04/08/15:  - Left ventricle: Global LV strain is -18.7% The cavity size was normal. Systolic function was normal. The estimated ejection fraction was in the range of 50% to 55%. Wall motion was normal; there were no regional wall motion abnormalities. - Mitral valve: There was trivial regurgitation. - Tricuspid valve: There was mild regurgitation. - Pulmonic valve: There was mild regurgitation.  If EKG acceptable DOS, I anticipate pt can proceed as scheduled.   Willeen Cass, FNP-BC [facility] Short Stay Surgical Center/Anesthesiology Phone: [phone] 06/04/2016 1:42 PM

## 2016-06-04 NOTE — Pre-Procedure Instructions (Signed)
Brenda Spencer  06/04/2016      [id number] - [place], [place] - [address] [place] [address] Phone: [phone] Fax: [phone]    Your procedure is scheduled on Tues, June 27 @ 7:30 AM  Report to [facility] Admitting at 5:30 AM  Call this number if you have problems the morning of surgery:  [phone]   Remember:  Do not eat food or drink liquids after midnight.  Take these medicines the morning of surgery with A SIP OF WATER Albuterol(if needed)<Bring Your Inhaler With You>              Stop taking Ibuprofen. No Goody's,BC's,Aleve,Advil,Motrin,Fish Oil,or any Herbal Medications.    Do not wear jewelry, make-up or nail polish.  Do not wear lotions, powders, or perfumes.    Do not shave 48 hours prior to surgery.    Do not bring valuables to the hospital.  [facility] is not responsible for any belongings or valuables.  Contacts, dentures or bridgework may not be worn into surgery.  Leave your suitcase in the car.  After surgery it may be brought to your room.  For patients admitted to the hospital, discharge time will be determined by your treatment team.  Patients discharged the day of surgery will not be allowed to drive home.    [facility]  Before surgery, you can play an important role.  Because skin is not sterile, your skin needs to be as free of germs as possible.  You can reduce the number of germs on you skin by washing with CHG (chlorahexidine gluconate) soap before surgery.  CHG is an antiseptic cleaner which kills germs and bonds with the skin to continue killing germs even after washing.  Please DO NOT use if you have an allergy to CHG or antibacterial soaps.  If your skin becomes reddened/irritated stop using the CHG and inform your nurse when you arrive at Short Stay.  Do not shave (including legs and underarms) for at  least 48 hours prior to the first CHG shower.  You may shave your face.  Please follow these instructions carefully:   1.  Shower with CHG Soap the night before surgery and the                                morning of Surgery.  2.  If you choose to wash your hair, wash your hair first as usual with your       normal shampoo.  3.  After you shampoo, rinse your hair and body thoroughly to remove the                      Shampoo.  4.  Use CHG as you would any other liquid soap.  You can apply chg directly       to the skin and wash gently with scrungie or a clean washcloth.  5.  Apply the CHG Soap to your body ONLY FROM THE NECK DOWN.        Do not use on open wounds or open sores.  Avoid contact with your eyes,       ears, mouth and genitals (private parts).  Wash genitals (private parts)       with your normal soap.  6.  Wash thoroughly, paying special attention to the area where your surgery        will be performed.  7.  Thoroughly rinse your body with warm water from the neck down.  8.  DO NOT shower/wash with your normal soap after using and rinsing off       the CHG Soap.  9.  Pat yourself dry with a clean towel.            10.  Wear clean pajamas.            11.  Place clean sheets on your bed the night of your first shower and do not        sleep with pets.  Day of Surgery  Do not apply any lotions/deoderants the morning of surgery.  Please wear clean clothes to the hospital/surgery center.     Please read over the following fact sheets that you were given. Pain Booklet

## 2016-06-04 NOTE — Progress Notes (Addendum)
Denies having a cardiologist but did see one in 2011 when had 1st child  Medical Md is Dr.Carol Evette Doffing  Echo report in epic from 04-03-15  Stress test denies  Heart cath denies  EKG in epic from 08-06-15  CXR denies in past yr

## 2016-06-09 ENCOUNTER — Inpatient Hospital Stay (HOSPITAL_COMMUNITY): Payer: Medicaid Other | Admitting: Emergency Medicine

## 2016-06-09 ENCOUNTER — Encounter (HOSPITAL_COMMUNITY): Payer: Self-pay | Admitting: Anesthesiology

## 2016-06-09 ENCOUNTER — Inpatient Hospital Stay (HOSPITAL_COMMUNITY): Payer: Medicaid Other | Admitting: Anesthesiology

## 2016-06-09 ENCOUNTER — Inpatient Hospital Stay (HOSPITAL_COMMUNITY)
Admission: RE | Admit: 2016-06-09 | Discharge: 2016-06-12 | DRG: 027 | Disposition: A | Discharge status: Home / Self Care | Payer: Medicaid Other | Source: Ambulatory Visit | Attending: Neurosurgery | Admitting: Neurosurgery

## 2016-06-09 ENCOUNTER — Encounter (HOSPITAL_COMMUNITY)
Admission: RE | Disposition: A | Discharge status: Home / Self Care | Payer: Self-pay | Source: Ambulatory Visit | Attending: Neurosurgery

## 2016-06-09 DIAGNOSIS — R51 Headache: Secondary | ICD-10-CM

## 2016-06-09 DIAGNOSIS — G935 Compression of brain: Secondary | ICD-10-CM | POA: Diagnosis present

## 2016-06-09 DIAGNOSIS — R519 Headache, unspecified: Secondary | ICD-10-CM | POA: Diagnosis present

## 2016-06-09 DIAGNOSIS — Z23 Encounter for immunization: Secondary | ICD-10-CM

## 2016-06-09 HISTORY — PX: SUBOCCIPITAL CRANIECTOMY CERVICAL LAMINECTOMY: SHX5404

## 2016-06-09 SURGERY — SUBOCCIPITAL CRANIECTOMY CERVICAL LAMINECTOMY/DURAPLASTY
Anesthesia: General | Site: Head

## 2016-06-09 MED ORDER — DEXMEDETOMIDINE HCL IN NACL 200 MCG/50ML IV SOLN
INTRAVENOUS | Status: AC
Start: 2016-06-09 — End: 2016-06-09
  Filled 2016-06-09: qty 50

## 2016-06-09 MED ORDER — ARTIFICIAL TEARS OP OINT
TOPICAL_OINTMENT | OPHTHALMIC | Status: AC
  Filled 2016-06-09: qty 10.5

## 2016-06-09 MED ORDER — LIDOCAINE 2% (20 MG/ML) 5 ML SYRINGE
INTRAMUSCULAR | Status: AC
  Filled 2016-06-09: qty 5

## 2016-06-09 MED ORDER — LIDOCAINE HCL (CARDIAC) 20 MG/ML IV SOLN
INTRAVENOUS | Status: DC | PRN
  Administered 2016-06-09: 10 mg via INTRAVENOUS

## 2016-06-09 MED ORDER — CYCLOBENZAPRINE HCL 10 MG PO TABS
10.0000 mg | ORAL_TABLET | Freq: Three times a day (TID) | ORAL | Status: DC | PRN
  Administered 2016-06-10 – 2016-06-12 (×4): 10 mg via ORAL
  Filled 2016-06-09 (×4): qty 1

## 2016-06-09 MED ORDER — DEXAMETHASONE SODIUM PHOSPHATE 10 MG/ML IJ SOLN
INTRAMUSCULAR | Status: AC
  Filled 2016-06-09: qty 1

## 2016-06-09 MED ORDER — ARTIFICIAL TEARS OP OINT
TOPICAL_OINTMENT | OPHTHALMIC | Status: DC | PRN
  Administered 2016-06-09: 1 via OPHTHALMIC

## 2016-06-09 MED ORDER — LABETALOL HCL 5 MG/ML IV SOLN: 10.0000 mg | INTRAVENOUS | Status: DC | PRN

## 2016-06-09 MED ORDER — HEMOSTATIC AGENTS (NO CHARGE) OPTIME
TOPICAL | Status: DC | PRN
  Administered 2016-06-09: 1 via TOPICAL

## 2016-06-09 MED ORDER — ALBUTEROL SULFATE HFA 108 (90 BASE) MCG/ACT IN AERS
INHALATION_SPRAY | RESPIRATORY_TRACT | Status: AC
  Filled 2016-06-09: qty 6.7

## 2016-06-09 MED ORDER — DOCUSATE SODIUM 100 MG PO CAPS
100.0000 mg | ORAL_CAPSULE | Freq: Two times a day (BID) | ORAL | Status: DC
Start: 2016-06-09 — End: 2016-06-12
  Administered 2016-06-09 – 2016-06-12 (×4): 100 mg via ORAL
  Filled 2016-06-09 (×4): qty 1

## 2016-06-09 MED ORDER — DEXAMETHASONE SODIUM PHOSPHATE 10 MG/ML IJ SOLN
INTRAMUSCULAR | Status: DC | PRN
  Administered 2016-06-09: 10 mg via INTRAVENOUS

## 2016-06-09 MED ORDER — ALBUTEROL SULFATE (2.5 MG/3ML) 0.083% IN NEBU: 2.5000 mg | INHALATION_SOLUTION | Freq: Four times a day (QID) | RESPIRATORY_TRACT | Status: DC | PRN

## 2016-06-09 MED ORDER — ROCURONIUM BROMIDE 50 MG/5ML IV SOLN
INTRAVENOUS | Status: AC
  Filled 2016-06-09: qty 2

## 2016-06-09 MED ORDER — SODIUM CHLORIDE 0.9 % IV SOLN
INTRAVENOUS | Status: DC
  Administered 2016-06-09: 15:00:00 via INTRAVENOUS
  Administered 2016-06-10: 75 mL/h via INTRAVENOUS

## 2016-06-09 MED ORDER — PROMETHAZINE HCL 25 MG/ML IJ SOLN: 6.2500 mg | INTRAMUSCULAR | Status: DC | PRN

## 2016-06-09 MED ORDER — MIDAZOLAM HCL 5 MG/5ML IJ SOLN
INTRAMUSCULAR | Status: DC | PRN
  Administered 2016-06-09: 2 mg via INTRAVENOUS

## 2016-06-09 MED ORDER — MORPHINE SULFATE (PF) 2 MG/ML IV SOLN
1.0000 mg | INTRAVENOUS | Status: DC | PRN
  Administered 2016-06-09: 2 mg via INTRAVENOUS
  Administered 2016-06-09 – 2016-06-10 (×4): 1 mg via INTRAVENOUS
  Administered 2016-06-10: 2 mg via INTRAVENOUS
  Administered 2016-06-10 (×5): 1 mg via INTRAVENOUS
  Administered 2016-06-11 – 2016-06-12 (×10): 2 mg via INTRAVENOUS
  Filled 2016-06-09 (×21): qty 1

## 2016-06-09 MED ORDER — FENTANYL CITRATE (PF) 250 MCG/5ML IJ SOLN
INTRAMUSCULAR | Status: AC
  Filled 2016-06-09: qty 5

## 2016-06-09 MED ORDER — MIDAZOLAM HCL 2 MG/2ML IJ SOLN
INTRAMUSCULAR | Status: AC
  Filled 2016-06-09: qty 2

## 2016-06-09 MED ORDER — ONDANSETRON HCL 4 MG/2ML IJ SOLN
INTRAMUSCULAR | Status: AC
  Filled 2016-06-09: qty 2

## 2016-06-09 MED ORDER — CEFAZOLIN SODIUM 1 G IJ SOLR
INTRAMUSCULAR | Status: AC
  Filled 2016-06-09: qty 20

## 2016-06-09 MED ORDER — CETYLPYRIDINIUM CHLORIDE 0.05 % MT LIQD
7.0000 mL | Freq: Two times a day (BID) | OROMUCOSAL | Status: DC
  Administered 2016-06-09 – 2016-06-12 (×4): 7 mL via OROMUCOSAL

## 2016-06-09 MED ORDER — HYDROMORPHONE HCL 1 MG/ML IJ SOLN
INTRAMUSCULAR | Status: AC
  Filled 2016-06-09: qty 1

## 2016-06-09 MED ORDER — DEXMEDETOMIDINE HCL 200 MCG/2ML IV SOLN
INTRAVENOUS | Status: DC | PRN
  Administered 2016-06-09 (×3): 4 ug via INTRAVENOUS

## 2016-06-09 MED ORDER — CITALOPRAM HYDROBROMIDE 20 MG PO TABS
20.0000 mg | ORAL_TABLET | Freq: Every day | ORAL | Status: DC
  Administered 2016-06-10 – 2016-06-12 (×3): 20 mg via ORAL
  Filled 2016-06-09: qty 1
  Filled 2016-06-09 (×2): qty 2

## 2016-06-09 MED ORDER — BUPIVACAINE HCL (PF) 0.5 % IJ SOLN
INTRAMUSCULAR | Status: DC | PRN
  Administered 2016-06-09: 5 mL

## 2016-06-09 MED ORDER — HYDROCODONE-ACETAMINOPHEN 5-325 MG PO TABS
1.0000 | ORAL_TABLET | ORAL | Status: DC | PRN
  Administered 2016-06-09 – 2016-06-12 (×16): 1 via ORAL
  Filled 2016-06-09 (×16): qty 1

## 2016-06-09 MED ORDER — CEFAZOLIN SODIUM-DEXTROSE 2-4 GM/100ML-% IV SOLN
2.0000 g | INTRAVENOUS | Status: AC
  Administered 2016-06-09 (×2): 2 g via INTRAVENOUS
  Filled 2016-06-09: qty 100

## 2016-06-09 MED ORDER — LACTATED RINGERS IV SOLN
INTRAVENOUS | Status: DC | PRN
  Administered 2016-06-09 (×2): via INTRAVENOUS

## 2016-06-09 MED ORDER — HYDROMORPHONE HCL 1 MG/ML IJ SOLN
0.2500 mg | INTRAMUSCULAR | Status: DC | PRN
  Administered 2016-06-09: 0.25 mg via INTRAVENOUS
  Administered 2016-06-09 (×2): 0.5 mg via INTRAVENOUS
  Administered 2016-06-09: 0.25 mg via INTRAVENOUS

## 2016-06-09 MED ORDER — PROMETHAZINE HCL 25 MG PO TABS: 12.5000 mg | ORAL_TABLET | ORAL | Status: DC | PRN

## 2016-06-09 MED ORDER — PROPOFOL 10 MG/ML IV BOLUS
INTRAVENOUS | Status: AC
  Filled 2016-06-09: qty 40

## 2016-06-09 MED ORDER — ROCURONIUM BROMIDE 100 MG/10ML IV SOLN
INTRAVENOUS | Status: DC | PRN
  Administered 2016-06-09: 50 mg via INTRAVENOUS
  Administered 2016-06-09 (×4): 10 mg via INTRAVENOUS

## 2016-06-09 MED ORDER — SENNA 8.6 MG PO TABS
1.0000 | ORAL_TABLET | Freq: Two times a day (BID) | ORAL | Status: DC
  Administered 2016-06-09 – 2016-06-12 (×4): 8.6 mg via ORAL
  Filled 2016-06-09 (×4): qty 1

## 2016-06-09 MED ORDER — ACETAMINOPHEN 10 MG/ML IV SOLN
INTRAVENOUS | Status: AC
  Administered 2016-06-09: 1000 mg via INTRAVENOUS
  Filled 2016-06-09: qty 100

## 2016-06-09 MED ORDER — ALBUTEROL SULFATE HFA 108 (90 BASE) MCG/ACT IN AERS
INHALATION_SPRAY | RESPIRATORY_TRACT | Status: DC | PRN
  Administered 2016-06-09: 4 via RESPIRATORY_TRACT

## 2016-06-09 MED ORDER — CEFAZOLIN SODIUM-DEXTROSE 2-4 GM/100ML-% IV SOLN
2.0000 g | Freq: Three times a day (TID) | INTRAVENOUS | Status: AC
  Administered 2016-06-09 – 2016-06-10 (×2): 2 g via INTRAVENOUS
  Filled 2016-06-09 (×3): qty 100

## 2016-06-09 MED ORDER — LIDOCAINE-EPINEPHRINE 1 %-1:100000 IJ SOLN
INTRAMUSCULAR | Status: DC | PRN
  Administered 2016-06-09: 5 mL

## 2016-06-09 MED ORDER — PNEUMOCOCCAL VAC POLYVALENT 25 MCG/0.5ML IJ INJ: 0.5000 mL | INJECTION | INTRAMUSCULAR | Status: DC | PRN

## 2016-06-09 MED ORDER — BISACODYL 10 MG RE SUPP: 10.0000 mg | Freq: Every day | RECTAL | Status: DC | PRN

## 2016-06-09 MED ORDER — ONDANSETRON HCL 4 MG/2ML IJ SOLN
4.0000 mg | INTRAMUSCULAR | Status: DC | PRN
  Administered 2016-06-10: 4 mg via INTRAVENOUS
  Filled 2016-06-09: qty 2

## 2016-06-09 MED ORDER — MEPERIDINE HCL 25 MG/ML IJ SOLN: 6.2500 mg | INTRAMUSCULAR | Status: DC | PRN

## 2016-06-09 MED ORDER — 0.9 % SODIUM CHLORIDE (POUR BTL) OPTIME
TOPICAL | Status: DC | PRN
  Administered 2016-06-09 (×2): 1000 mL

## 2016-06-09 MED ORDER — SODIUM CHLORIDE 0.9 % IR SOLN
Status: DC | PRN
  Administered 2016-06-09: 500 mL

## 2016-06-09 MED ORDER — THROMBIN 5000 UNITS EX SOLR
OROMUCOSAL | Status: DC | PRN
  Administered 2016-06-09: 5 mL via TOPICAL

## 2016-06-09 MED ORDER — SUGAMMADEX SODIUM 500 MG/5ML IV SOLN
INTRAVENOUS | Status: DC | PRN
  Administered 2016-06-09: 365.6 mg via INTRAVENOUS

## 2016-06-09 MED ORDER — BACITRACIN ZINC 500 UNIT/GM EX OINT
TOPICAL_OINTMENT | CUTANEOUS | Status: DC | PRN
  Administered 2016-06-09: 1 via TOPICAL

## 2016-06-09 MED ORDER — FENTANYL CITRATE (PF) 100 MCG/2ML IJ SOLN
INTRAMUSCULAR | Status: DC | PRN
  Administered 2016-06-09: 250 ug via INTRAVENOUS
  Administered 2016-06-09 (×3): 50 ug via INTRAVENOUS

## 2016-06-09 MED ORDER — ONDANSETRON HCL 4 MG PO TABS
4.0000 mg | ORAL_TABLET | ORAL | Status: DC | PRN
  Administered 2016-06-11 (×3): 4 mg via ORAL
  Filled 2016-06-09 (×3): qty 1

## 2016-06-09 MED ORDER — DIAZEPAM 5 MG PO TABS
5.0000 mg | ORAL_TABLET | Freq: Three times a day (TID) | ORAL | Status: DC
  Administered 2016-06-09 – 2016-06-12 (×8): 5 mg via ORAL
  Filled 2016-06-09 (×8): qty 1

## 2016-06-09 MED ORDER — PHENYLEPHRINE HCL 10 MG/ML IJ SOLN
INTRAMUSCULAR | Status: DC | PRN
  Administered 2016-06-09: 80 ug via INTRAVENOUS

## 2016-06-09 MED ORDER — PROPOFOL 10 MG/ML IV BOLUS
INTRAVENOUS | Status: DC | PRN
  Administered 2016-06-09: 125 mg via INTRAVENOUS
  Administered 2016-06-09: 50 mg via INTRAVENOUS
  Administered 2016-06-09: 75 mg via INTRAVENOUS

## 2016-06-09 MED ORDER — THROMBIN 20000 UNITS EX SOLR
CUTANEOUS | Status: DC | PRN
  Administered 2016-06-09: 20 mL via TOPICAL

## 2016-06-09 MED ORDER — LACTATED RINGERS IV SOLN
INTRAVENOUS | Status: DC | PRN
  Administered 2016-06-09: 07:00:00 via INTRAVENOUS

## 2016-06-09 MED ORDER — MIDAZOLAM HCL 2 MG/2ML IJ SOLN: 0.5000 mg | Freq: Once | INTRAMUSCULAR | Status: DC | PRN

## 2016-06-09 SURGICAL SUPPLY — 81 items
APL SKNCLS STERI-STRIP NONHPOA (GAUZE/BANDAGES/DRESSINGS)
APL SRG 60D 8 XTD TIP BNDBL (TIP) ×1
BENZOIN TINCTURE PRP APPL 2/3 (GAUZE/BANDAGES/DRESSINGS) IMPLANT
BLADE CLIPPER SURG (BLADE) ×4 IMPLANT
BLADE SURG 11 STRL SS (BLADE) ×3 IMPLANT
BLADE ULTRA TIP 2M (BLADE) ×2 IMPLANT
BRUSH SCRUB EZ 1% IODOPHOR (MISCELLANEOUS) ×1 IMPLANT
BUR ACORN 6.0 PRECISION (BURR) ×2 IMPLANT
BUR ACORN 6.0MM PRECISION (BURR) ×1
BUR PRECISION FLUTE 5.0 (BURR) ×3 IMPLANT
CANISTER SUCT 3000ML PPV (MISCELLANEOUS) ×3 IMPLANT
CLIP TI MEDIUM 6 (CLIP) IMPLANT
CONT SPEC 4OZ CLIKSEAL STRL BL (MISCELLANEOUS) ×2 IMPLANT
COVER BACK TABLE 60X90IN (DRAPES) IMPLANT
DRAIN SNY WOU 7FLT (WOUND CARE) IMPLANT
DRAPE LAPAROTOMY 100X72 PEDS (DRAPES) ×3 IMPLANT
DRAPE MICROSCOPE LEICA (MISCELLANEOUS) ×2 IMPLANT
DRAPE WARM FLUID 44X44 (DRAPE) ×3 IMPLANT
DRSG OPSITE 4X5.5 SM (GAUZE/BANDAGES/DRESSINGS) ×6 IMPLANT
DRSG OPSITE POSTOP 4X6 (GAUZE/BANDAGES/DRESSINGS) ×2 IMPLANT
DURAGUARD 04CMX04CM IMPLANT
DURAGUARD 06CMX08CM ×2 IMPLANT
DURAPREP 6ML APPLICATOR 50/CS (WOUND CARE) ×3 IMPLANT
DURASEAL APPLICATOR TIP (TIP) ×2 IMPLANT
DURASEAL SPINE SEALANT 3ML (MISCELLANEOUS) ×2 IMPLANT
ELECT CAUTERY BLADE 6.4 (BLADE) ×3 IMPLANT
ELECT REM PT RETURN 9FT ADLT (ELECTROSURGICAL) ×3
ELECTRODE REM PT RTRN 9FT ADLT (ELECTROSURGICAL) ×1 IMPLANT
EVACUATOR 1/8 PVC DRAIN (DRAIN) IMPLANT
EVACUATOR SILICONE 100CC (DRAIN) IMPLANT
GAUZE SPONGE 4X4 12PLY STRL (GAUZE/BANDAGES/DRESSINGS) IMPLANT
GAUZE SPONGE 4X4 16PLY XRAY LF (GAUZE/BANDAGES/DRESSINGS) IMPLANT
GLOVE BIO SURGEON STRL SZ8 (GLOVE) ×2 IMPLANT
GLOVE BIOGEL PI IND STRL 7.0 (GLOVE) IMPLANT
GLOVE BIOGEL PI IND STRL 7.5 (GLOVE) IMPLANT
GLOVE BIOGEL PI IND STRL 8.5 (GLOVE) IMPLANT
GLOVE BIOGEL PI INDICATOR 7.0 (GLOVE) ×2
GLOVE BIOGEL PI INDICATOR 7.5 (GLOVE) ×2
GLOVE BIOGEL PI INDICATOR 8.5 (GLOVE) ×2
GLOVE ECLIPSE 7.0 STRL STRAW (GLOVE) ×2 IMPLANT
GLOVE ECLIPSE 7.5 STRL STRAW (GLOVE) ×5 IMPLANT
GLOVE EXAM NITRILE LRG STRL (GLOVE) IMPLANT
GLOVE EXAM NITRILE MD LF STRL (GLOVE) IMPLANT
GLOVE EXAM NITRILE XL STR (GLOVE) IMPLANT
GLOVE EXAM NITRILE XS STR PU (GLOVE) IMPLANT
GLOVE SURG SS PI 6.5 STRL IVOR (GLOVE) ×4 IMPLANT
GOWN STRL REUS W/ TWL LRG LVL3 (GOWN DISPOSABLE) ×1 IMPLANT
GOWN STRL REUS W/ TWL XL LVL3 (GOWN DISPOSABLE) IMPLANT
GOWN STRL REUS W/TWL 2XL LVL3 (GOWN DISPOSABLE) IMPLANT
GOWN STRL REUS W/TWL LRG LVL3 (GOWN DISPOSABLE) ×6
GOWN STRL REUS W/TWL XL LVL3 (GOWN DISPOSABLE) ×3
HEMOSTAT POWDER KIT SURGIFOAM (HEMOSTASIS) ×1 IMPLANT
HEMOSTAT POWDER SURGIFOAM 1G (HEMOSTASIS) ×3 IMPLANT
HEMOSTAT SURGICEL 2X14 (HEMOSTASIS) IMPLANT
KIT BASIN OR (CUSTOM PROCEDURE TRAY) ×3 IMPLANT
KIT ROOM TURNOVER OR (KITS) ×3 IMPLANT
NDL HYPO 25X1 1.5 SAFETY (NEEDLE) ×1 IMPLANT
NEEDLE HYPO 25X1 1.5 SAFETY (NEEDLE) ×3 IMPLANT
NS IRRIG 1000ML POUR BTL (IV SOLUTION) ×5 IMPLANT
PACK CRANIOTOMY (CUSTOM PROCEDURE TRAY) ×3 IMPLANT
PAD ARMBOARD 7.5X6 YLW CONV (MISCELLANEOUS) ×9 IMPLANT
PATTIES SURGICAL .5 X1 (DISPOSABLE) IMPLANT
PATTIES SURGICAL 1/4 X 3 (GAUZE/BANDAGES/DRESSINGS) IMPLANT
RUBBERBAND STERILE (MISCELLANEOUS) ×4 IMPLANT
SPONGE LAP 4X18 X RAY DECT (DISPOSABLE) IMPLANT
SPONGE SURGIFOAM ABS GEL 100 (HEMOSTASIS) ×3 IMPLANT
STAPLER SKIN PROX WIDE 3.9 (STAPLE) ×2 IMPLANT
STAPLER VISISTAT 35W (STAPLE) ×2 IMPLANT
SUT ETHILON 3 0 FSL (SUTURE) IMPLANT
SUT NURALON 4 0 TR CR/8 (SUTURE) ×2 IMPLANT
SUT PROLENE 6 0 BV (SUTURE) ×14 IMPLANT
SUT VIC AB 0 CT1 18XCR BRD8 (SUTURE) ×2 IMPLANT
SUT VIC AB 0 CT1 8-18 (SUTURE) ×15
SUT VICRYL 3-0 RB1 18 ABS (SUTURE) ×4 IMPLANT
SYR CONTROL 10ML LL (SYRINGE) ×1 IMPLANT
TOWEL OR 17X24 6PK STRL BLUE (TOWEL DISPOSABLE) ×2 IMPLANT
TOWEL OR 17X26 10 PK STRL BLUE (TOWEL DISPOSABLE) ×3 IMPLANT
TRAY FOLEY CATH 14FRSI W/METER (CATHETERS) ×3 IMPLANT
TRAY FOLEY W/METER SILVER 16FR (SET/KITS/TRAYS/PACK) IMPLANT
UNDERPAD 30X30 INCONTINENT (UNDERPADS AND DIAPERS) IMPLANT
WATER STERILE IRR 1000ML POUR (IV SOLUTION) ×3 IMPLANT

## 2016-06-09 NOTE — Anesthesia Preprocedure Evaluation (Addendum)
Anesthesia Evaluation  Patient identified by MRN, date of birth, ID band Patient awake    Reviewed: Allergy & Precautions, H&P , NPO status , Patient's Chart, lab work & pertinent test results  History of Anesthesia Complications (+) PONV  Airway Mallampati: II  TM Distance: >3 FB Neck ROM: Full    Dental  (+) Teeth Intact, Dental Advidsory Given   Pulmonary asthma ,    breath sounds clear to auscultation       Cardiovascular +CHF (with pre-eclampsia/pregnancy)   Rhythm:regular Rate:Normal  4/16 ECHO: EF 50-55%, valves and LVF OK   Neuro/Psych  Headaches, Chiari    GI/Hepatic negative GI ROS, Neg liver ROS,   Endo/Other  Morbid obesity  Renal/GU negative Renal ROS     Musculoskeletal   Abdominal (+) + obese,   Peds  Hematology negative hematology ROS (+)   Anesthesia Other Findings Inhaler for asthma on as needed basis, last used 06/06/2016  Reproductive/Obstetrics                         Anesthesia Physical Anesthesia Plan  ASA: III  Anesthesia Plan: General   Post-op Pain Management:    Induction: Intravenous  Airway Management Planned: Oral ETT  Additional Equipment:   Intra-op Plan:   Post-operative Plan: Extubation in OR  Informed Consent: I have reviewed the patients History and Physical, chart, labs and discussed the procedure including the risks, benefits and alternatives for the proposed anesthesia with the patient or authorized representative who has indicated his/her understanding and acceptance.   Dental Advisory Given  Plan Discussed with: Anesthesiologist, CRNA and Surgeon  Anesthesia Plan Comments: (Plan routine monitors, GETA)      Anesthesia Quick Evaluation

## 2016-06-09 NOTE — H&P (Signed)
CC:  Headache  HPI: Brenda Spencer is a 25 y.o. female initially seen in the office with headaches of about one year duration. They have been progressively worsening. They started when she was pregnant with her child. Valsalva maneuvers significantly worsen the headaches including coughing, or straining. She describes bilateral headache associated with blurry vision lasting a few minutes numerous times per day. She has been tried on diamox without relief.  PMH: Past Medical History  Diagnosis Date  . Acute post-traumatic headache, not intractable 01/19/2016  . CHF (congestive heart failure) ([place]) 2011    RESOLVED; HAD CARDIAC EVAL   . Congestive heart failure ([place]) 2011    related to preeclampsia  . Asthma     ALbuterol inhaler as needed  . Chronic back pain     reason unknown  . Nocturia     PSH: Past Surgical History  Procedure Laterality Date  . Wisdom tooth extraction      as a teenager  . Tear duct probing  as a child  . Tubal ligation Bilateral 09/10/2015    Procedure: POST PARTUM TUBAL LIGATION;  Surgeon: Mora Bellman, MD;  Location: [facility];  Service: Gynecology;  Laterality: Bilateral;  . Tonsillectomy      as a teenager  . Cholecystectomy      teenager    SH: Social History  Substance Use Topics  . Smoking status: Never Smoker   . Smokeless tobacco: Never Used  . Alcohol Use: No    MEDS: Prior to Admission medications   Medication Sig Start Date End Date Taking? Authorizing Provider  albuterol (PROVENTIL HFA;VENTOLIN HFA) 108 (90 Base) MCG/ACT inhaler Inhale 2 puffs into the lungs every 6 (six) hours as needed for wheezing or shortness of breath. 01/16/16  Yes Eustaquio Maize, MD  acetaZOLAMIDE (DIAMOX) 500 MG capsule Take 1 capsule (500 mg total) by mouth 2 (two) times daily. Patient not taking: Reported on 05/28/2016 01/20/16   Pieter Partridge, DO  citalopram (CELEXA) 20 MG tablet Take 1 tablet (20 mg total) by mouth daily. Patient not taking: Reported on  05/28/2016 02/13/16   Eustaquio Maize, MD  cyclobenzaprine (FLEXERIL) 10 MG tablet Take once a day as needed for back spasms Patient not taking: Reported on 05/28/2016 02/13/16   Eustaquio Maize, MD  ibuprofen (ADVIL,MOTRIN) 600 MG tablet Take 1 tablet (600 mg total) by mouth every 8 (eight) hours as needed. 02/13/16   Eustaquio Maize, MD    ALLERGY: No Known Allergies  ROS: ROS  NEUROLOGIC EXAM: Awake, alert, oriented Memory and concentration grossly intact Speech fluent, appropriate CN grossly intact Motor exam: Upper Extremities Deltoid Bicep Tricep Grip  Right 5/5 5/5 5/5 5/5  Left 5/5 5/5 5/5 5/5   Lower Extremity IP Quad PF DF EHL  Right 5/5 5/5 5/5 5/5 5/5  Left 5/5 5/5 5/5 5/5 5/5   Sensation grossly intact to LT  [facility]: MRI demonstrates chiari malformation with tonsillar descent of >2cm to the level of C1-2 and crowding at the foramen magnum. No HCP.  IMPRESSION: - 25 y.o. female with headaches related to Chiari I malformation  PLAN: - Proceed with posterior fossa decompression with expansile duraplasty  I have reviewed the risks, benefits, and alternatives to surgery with the patient and her family in the office. All questions were answered.

## 2016-06-09 NOTE — Anesthesia Postprocedure Evaluation (Signed)
Anesthesia Post Note  Patient: Brenda Spencer  Procedure(s) Performed: Procedure(s) (LRB): Chiari Decompression (N/A)  Patient location during evaluation: PACU Anesthesia Type: General Level of consciousness: awake and alert, oriented and patient cooperative Pain management: pain level controlled Vital Signs Assessment: post-procedure vital signs reviewed and stable Respiratory status: spontaneous breathing, nonlabored ventilation, respiratory function stable and patient connected to nasal cannula oxygen Cardiovascular status: blood pressure returned to baseline and stable Postop Assessment: no signs of nausea or vomiting Anesthetic complications: no    Last Vitals:  Filed Vitals:   06/09/16 1500 06/09/16 1600  BP: 121/74 129/97  Pulse: 94 93  Temp:    Resp: 13 9    Last Pain:  Filed Vitals:   06/09/16 1653  PainSc: Asleep                 Earsel Shouse,E. Ayodele Sangalang

## 2016-06-09 NOTE — Op Note (Signed)
PREOP DIAGNOSIS:  1. Chiari I Malformation   POSTOP DIAGNOSIS: Same  PROCEDURE: 1. Suboccipital craniectomy 2. Expansile Duraplasty 3. Use of intraoperative microscope  SURGEON: Dr. Consuella Lose, MD  ASSISTANT: Dr. Erline Levine, MD  ANESTHESIA: General Endotracheal  EBL: 100cc  SPECIMENS: None  DRAINS: None  COMPLICATIONS: None immediate  CONDITION: Hemodynamically stable to PACU  HISTORY: Brenda Spencer is a 25 y.o. female initially seen in the outpatient neurosurgery clinic with progressively worsening HA. Workup included MRI which demonstrated Chiari I malformation with tonsillar descent of a proximally 2 cm. Treatment options were discussed, and the patient elected to proceed with surgical decompression. The risks and benefits of the surgery were explained in detail. After all questions were answered, informed consent was obtained.  PROCEDURE IN DETAIL: After informed consent was obtained and witnessed, the patient was brought to the operating room. After induction of general anesthesia, the [place] head holder was applied to the patient, and she was positioned on the operative table in the prone position. All pressure points were then meticulously padded. The skin of the occiput was then clipped prepped and draped in the usual sterile fashion.  After timeout was conducted, skin incision was infiltrated with local and aesthetic with epinephrine. Skin incision was then made sharply, and Bovie electrocautery was used to dissect through subcutaneous tissue until the nuchal fascia was identified. This was then incised in a Y-shaped fashion based superiorly. The nuchal musculature was then divided in the avascular midline plane, and the occipital bone, foramen magnum, and lamina of C1 were identified and dissected in the subperiosteal plane. Self-retaining retractor was then placed.  Utilizing a high-speed drill, suboccipital craniectomy was completed, and extended  laterally until the entire width of the foramen magnum was opened, and the lateral edge of the foramen magnum was palpable. C1 laminectomy was then completed utilizing the high-speed drill and Kerrison rongeurs. At this point the microscope was draped sterilely and brought into the field, and the remainder of the case was done under the microscope using microdissection.  The posterior fossa dura was opened in a Y-shaped fashion along the cerebellar tonsils, and in the midline down to C1. The cerebellar tonsils were identified extending to a level of approximately C2. The tonsils were split and arachnoid adhesions were cut.  At this point, attention was turned to expansile duraplasty. A sewable piece of dural substitute was selected, cut to size, and using a combination of interrupted and continuous 6-0 Prolene stitches this was sewed in a watertight fashion. The dural graft and suture line was then covered with a layer of DuraSeal.  At this point the wound is irrigated with copious amounts of normal saline irrigation. Good hemostasis was confirmed. The wound was then closed in multiple layers using a combination of 0 Vicryl stitches. Skin was then closed with staples. The patient was then transferred to the stretcher, the [place] head holder removed, she was extubated, and taken to the postanesthesia care unit in stable hemodynamic condition.  At the end of the case all sponge, needle, cottonoid, and instrument counts were correct.

## 2016-06-09 NOTE — Transfer of Care (Signed)
Immediate Anesthesia Transfer of Care Note  Patient: Brenda Spencer  Procedure(s) Performed: Procedure(s) with comments: Chiari Decompression (N/A) - posterior/occipital  Patient Location: PACU  Anesthesia Type:General  Level of Consciousness: awake, alert  and oriented  Airway & Oxygen Therapy: Patient Spontanous Breathing and Patient connected to face mask oxygen  Post-op Assessment: Report given to RN, Post -op Vital signs reviewed and stable and Patient moving all extremities X 4  Post vital signs: Reviewed and stable  Last Vitals: There were no vitals filed for this visit.  Last Pain:  Filed Vitals:   06/09/16 0631  PainSc: 4       Patients Stated Pain Goal: 6 ([id number])  Complications: No apparent anesthesia complications

## 2016-06-09 NOTE — Anesthesia Procedure Notes (Signed)
Procedure Name: Intubation Date/Time: 06/09/2016 7:39 AM Performed by: Neldon Newport Pre-anesthesia Checklist: Timeout performed, Patient being monitored, Suction available, Emergency Drugs available and Patient identified Patient Re-evaluated:Patient Re-evaluated prior to inductionOxygen Delivery Method: Circle system utilized Preoxygenation: Pre-oxygenation with 100% oxygen Intubation Type: IV induction Ventilation: Mask ventilation without difficulty Laryngoscope Size: Mac and 3 Grade View: Grade I Tube type: Oral Tube size: 7.0 mm Number of attempts: 1 Placement Confirmation: breath sounds checked- equal and bilateral,  positive ETCO2 and ETT inserted through vocal cords under direct vision Secured at: 21 cm Tube secured with: Tape Dental Injury: Teeth and Oropharynx as per pre-operative assessment

## 2016-06-10 ENCOUNTER — Encounter (HOSPITAL_COMMUNITY): Payer: Self-pay | Admitting: Neurosurgery

## 2016-06-10 NOTE — Progress Notes (Signed)
Pt seen and examined. No issues overnight. Has neck pain, but HA is improved in comparison to preop.  EXAM: Temp:  [97.4 F (36.3 C)-98.6 F (37 C)] 98.6 F (37 C) (06/28 1220) Pulse Rate:  [71-130] 97 (06/28 1600) Resp:  [7-21] 21 (06/28 1600) BP: (102-138)/(50-85) 107/50 mmHg (06/28 1600) SpO2:  [89 %-100 %] 96 % (06/28 1600) Intake/Output      06/27 0701 - 06/28 0700 06/28 0701 - 06/29 0700   P.O. 400 120   I.V. (mL/kg) 4162.5 (44) 426.3 (4.5)   IV Piggyback 200    Total Intake(mL/kg) 4762.5 (50.3) 546.3 (5.8)   Urine (mL/kg/hr) 3025 (1.3) 1425 (1.4)   Blood 100 (0)    Total Output 3125 1425   Net +1637.5 -878.8         Awake, alert, oriented Speech fluent CN intact Good strength No evidence of leak, wound c/d/i  LABS: Lab Results  Component Value Date   CREATININE 0.53 06/04/2016   BUN 8 06/04/2016   NA 136 06/04/2016   K 3.8 06/04/2016   CL 105 06/04/2016   CO2 24 06/04/2016   Lab Results  Component Value Date   WBC 9.0 06/04/2016   HGB 12.4 06/04/2016   HCT 38.5 06/04/2016   MCV 85.4 06/04/2016   PLT 286 06/04/2016    IMPRESSION: - 25 y.o. female POD#1 Chiari decompression, doing well  PLAN: - Mobilize today - Cont valium q8 scheduled

## 2016-06-11 NOTE — Progress Notes (Signed)
Report called to 5C 

## 2016-06-11 NOTE — Progress Notes (Signed)
Pt seen and examined. No issues overnight. Improving neck soreness. Minimal HA.  EXAM: Temp:  [98.6 F (37 C)-99.2 F (37.3 C)] 99.2 F (37.3 C) (06/29 0400) Pulse Rate:  [87-130] 95 (06/29 0700) Resp:  [9-27] 16 (06/29 0700) BP: (103-138)/(50-86) 108/80 mmHg (06/29 0700) SpO2:  [89 %-99 %] 97 % (06/29 0700) Intake/Output      06/28 0701 - 06/29 0700 06/29 0701 - 06/30 0700   P.O. 120    I.V. (mL/kg) 706.3 (7.5)    IV Piggyback     Total Intake(mL/kg) 826.3 (8.7)    Urine (mL/kg/hr) 1425 (0.6)    Blood     Total Output 1425     Net -598.8          Urine Occurrence 5 x 1 x    Awake, alert, oriented Speech fluent CN intact Good strength Wound c/d/i, no leak  LABS: Lab Results  Component Value Date   CREATININE 0.53 06/04/2016   BUN 8 06/04/2016   NA 136 06/04/2016   K 3.8 06/04/2016   CL 105 06/04/2016   CO2 24 06/04/2016   Lab Results  Component Value Date   WBC 9.0 06/04/2016   HGB 12.4 06/04/2016   HCT 38.5 06/04/2016   MCV 85.4 06/04/2016   PLT 286 06/04/2016    IMPRESSION: - 25 y.o. female POD#2 s/p Chiari decompression, doing well  PLAN: - Will transfer to floor - Cont to mobilize

## 2016-06-11 NOTE — Progress Notes (Signed)
Attempted report waiting for nurse to call back for correct assignment.

## 2016-06-11 NOTE — Care Management Note (Signed)
Case Management Note  Patient Details  Name: LIBERTA DELAPLANE MRN: [id number] Date of Birth: [birth year]  Subjective/Objective:  Pt admitted on 06/09/16 s/p craniectomy for chiari malformation.  PTA, pt independent; has supportive family.                   Action/Plan: Will follow for discharge planning as pt progresses.    Expected Discharge Date:                  Expected Discharge Plan:  Home/Self Care  In-House Referral:     Discharge planning Services  CM Consult  Post Acute Care Choice:    Choice offered to:     DME Arranged:    DME Agency:     HH Arranged:    HH Agency:     Status of Service:  In process, will continue to follow  If discussed at Long Length of Stay Meetings, dates discussed:    Additional Comments:  Reinaldo Raddle, RN, BSN  Trauma/Neuro ICU Case Manager [phone]

## 2016-06-12 MED ORDER — DIAZEPAM 5 MG PO TABS: 5.0000 mg | ORAL_TABLET | Freq: Three times a day (TID) | ORAL | Status: DC | PRN

## 2016-06-12 MED ORDER — HYDROCODONE-ACETAMINOPHEN 5-325 MG PO TABS: 1.0000 | ORAL_TABLET | Freq: Four times a day (QID) | ORAL | Status: DC | PRN

## 2016-06-12 NOTE — Discharge Summary (Signed)
  Physician Discharge Summary  Patient ID: JANACIA GENOVA MRN: [id number] DOB/AGE: 1991-11-18 25 y.o.  Admit date: 06/09/2016 Discharge date: 06/12/2016  Admission Diagnoses: Chiari I malformation  Discharge Diagnoses: Same Active Problems:   Headache   Chiari I malformation [facility])   Discharged Condition: Stable  Hospital Course:  [name] is a 25 y.o. female electively admitted after Chiari decompression. She was at baseline postop, with improvement in her preop HA. She had steadily improving neck soreness. She was tolerating diet, voiding normally.   Treatments: Surgery - Chiari decompression  Discharge Exam: Blood pressure 130/71, pulse 95, temperature 99 F (37.2 C), temperature source Oral, resp. rate 20, height 5\' 1"  (1.549 m), weight 94.6 kg (208 lb 8.9 oz), SpO2 97 %, not currently breastfeeding. Awake, alert, oriented Speech fluent, appropriate CN grossly intact 5/5 BUE/BLE Wound c/d/i  Disposition: 01-Home or Self Care     Medication List    STOP taking these medications        acetaZOLAMIDE 500 MG capsule  Commonly known as:  DIAMOX     cyclobenzaprine 10 MG tablet  Commonly known as:  FLEXERIL     ibuprofen 600 MG tablet  Commonly known as:  ADVIL,MOTRIN      TAKE these medications        albuterol 108 (90 Base) MCG/ACT inhaler  Commonly known as:  PROVENTIL HFA;VENTOLIN HFA  Inhale 2 puffs into the lungs every 6 (six) hours as needed for wheezing or shortness of breath.     citalopram 20 MG tablet  Commonly known as:  CELEXA  Take 1 tablet (20 mg total) by mouth daily.     diazepam 5 MG tablet  Commonly known as:  VALIUM  Take 1 tablet (5 mg total) by mouth every 8 (eight) hours as needed for muscle spasms.     HYDROcodone-acetaminophen 5-325 MG tablet  Commonly known as:  NORCO/VICODIN  Take 1 tablet by mouth every 6 (six) hours as needed for moderate pain.           Follow-up Information    Follow up with  [facility], Ronan Dion, C, MD In 2 weeks.   Specialty:  Neurosurgery   Contact information:   [address] [place] [address] [place] [address] [phone]       Signed: Jairo Ben 06/12/2016, 9:33 AM

## 2016-06-12 NOTE — Progress Notes (Signed)
Discharge orders received, Pt for discharge home today . IV d/c'd. D/c instructions and RX given with verbalized understanding. Family at bedside to assist patient with discharge. Staff bought pt downstairs via wheelchair.

## 2016-06-12 NOTE — Care Management Note (Signed)
Case Management Note  Patient Details  Name: TEYLA HELBIG MRN: [id number] Date of Birth: [birth year]  Subjective/Objective:                    Action/Plan: Pt discharging home with self care. No further needs per CM.   Expected Discharge Date:                  Expected Discharge Plan:  Home/Self Care  In-House Referral:     Discharge planning Services  CM Consult  Post Acute Care Choice:    Choice offered to:     DME Arranged:    DME Agency:     HH Arranged:    [place] Agency:     Status of Service:  Completed, signed off  If discussed at [employer] of Stay Meetings, dates discussed:    Additional Comments:  Pollie Friar, RN 06/12/2016, 10:50 AM

## 2016-06-17 ENCOUNTER — Encounter (HOSPITAL_COMMUNITY): Payer: Self-pay | Admitting: *Deleted

## 2016-06-17 ENCOUNTER — Emergency Department (HOSPITAL_COMMUNITY)
Admission: EM | Admit: 2016-06-17 | Discharge: 2016-06-18 | Disposition: A | Discharge status: Home / Self Care | Payer: Medicaid Other | Attending: Emergency Medicine | Admitting: Emergency Medicine

## 2016-06-17 ENCOUNTER — Emergency Department (HOSPITAL_COMMUNITY): Payer: Medicaid Other

## 2016-06-17 DIAGNOSIS — J45909 Unspecified asthma, uncomplicated: Secondary | ICD-10-CM | POA: Diagnosis not present

## 2016-06-17 DIAGNOSIS — Z79899 Other long term (current) drug therapy: Secondary | ICD-10-CM | POA: Insufficient documentation

## 2016-06-17 DIAGNOSIS — R51 Headache: Secondary | ICD-10-CM | POA: Diagnosis present

## 2016-06-17 DIAGNOSIS — G8918 Other acute postprocedural pain: Secondary | ICD-10-CM | POA: Diagnosis not present

## 2016-06-17 DIAGNOSIS — I509 Heart failure, unspecified: Secondary | ICD-10-CM | POA: Diagnosis not present

## 2016-06-17 MED ORDER — MORPHINE SULFATE (PF) 4 MG/ML IV SOLN
4.0000 mg | Freq: Once | INTRAVENOUS | Status: AC
Start: 2016-06-17 — End: 2016-06-17
  Administered 2016-06-17: 4 mg via INTRAVENOUS
  Filled 2016-06-17: qty 1

## 2016-06-17 NOTE — ED Provider Notes (Signed)
CSN: [id number]     Arrival date & time 06/17/16  2152 History  By signing my name below, I, [name], attest that this documentation has been prepared under the direction and in the presence of [employer], PA-C. Electronically Signed: [name], ED Scribe. 06/17/2016. 11:23 PM.      Chief Complaint  Patient presents with  . Shoulder Pain  . Headache    The history is provided by the patient. No language interpreter was used.   HPI Comments: Brenda Spencer is a 25 y.o. female who presents to the Emergency Department complaining of sudden back pain and right shoulder pain onset today at 7 PM. Pt states that her symptoms began after turning awkwardly while sitting on the couch. Pt also reports posterior HA. Pt reports recent decompression surgery for chiari malformation on 06/04/16. Pt states that she was prescribed valium that has not provided any relief for her symptoms. Pt denies vision changes, nausea, vomiting, wekness, numbness or loss of balance.     Past Medical History  Diagnosis Date  . Acute post-traumatic headache, not intractable 01/19/2016  . CHF (congestive heart failure) ([place]) 2011    RESOLVED; HAD CARDIAC EVAL   . Congestive heart failure ([place]) 2011    related to preeclampsia  . Asthma     ALbuterol inhaler as needed  . Chronic back pain     reason unknown  . Nocturia    Past Surgical History  Procedure Laterality Date  . Wisdom tooth extraction      as a teenager  . Tear duct probing  as a child  . Tubal ligation Bilateral 09/10/2015    Procedure: POST PARTUM TUBAL LIGATION;  Surgeon: Mora Bellman, MD;  Location: [facility];  Service: Gynecology;  Laterality: Bilateral;  . Tonsillectomy      as a teenager  . Cholecystectomy      teenager  . Suboccipital craniectomy cervical laminectomy N/A 06/09/2016    Procedure: Chiari Decompression;  Surgeon: Consuella Lose, MD;  Location: [facility];  Service: Neurosurgery;  Laterality: N/A;   posterior/occipital   Family History  Problem Relation Age of Onset  . Anesthesia problems Neg Hx   . Other Neg Hx   . Asthma Brother   . Learning disabilities Brother   . Asthma Daughter   . Learning disabilities Daughter   . Seizures Daughter   . Learning disabilities Other   . Colon cancer Maternal Grandmother   . Cancer Maternal Grandmother     colon  . Chromosomal disorder Daughter     1q21.1 microdeletion  . Cataracts Daughter     congenital   Social History  Substance Use Topics  . Smoking status: Never Smoker   . Smokeless tobacco: Never Used  . Alcohol Use: No   OB History    Gravida Para Term Preterm AB TAB SAB Ectopic Multiple Living   _0 0 4      Obstetric Comments   2011 PIH;IOL; PP CHF-HOSPITALIZED X 1 WEEK     Review of Systems A complete 10 system review of systems was obtained and all systems are negative except as noted in the HPI and PMH.    Allergies  Review of patient's allergies indicates no known allergies.  Home Medications   Prior to Admission medications   Medication Sig Start Date End Date Taking? Authorizing Provider  albuterol (PROVENTIL HFA;VENTOLIN HFA) 108 (90 Base) MCG/ACT inhaler Inhale 2 puffs into the lungs  every 6 (six) hours as needed for wheezing or shortness of breath. 01/16/16  Yes Eustaquio Maize, MD  diazepam (VALIUM) 5 MG tablet Take 1 tablet (5 mg total) by mouth every 8 (eight) hours as needed for muscle spasms. 06/12/16  Yes Consuella Lose, MD  citalopram (CELEXA) 20 MG tablet Take 1 tablet (20 mg total) by mouth daily. Patient not taking: Reported on 05/28/2016 02/13/16   Eustaquio Maize, MD  HYDROcodone-acetaminophen (NORCO/VICODIN) 5-325 MG tablet Take 1-2 tablets by mouth every 4 (four) hours as needed. 06/18/16   Comer Locket, PA-C   BP 98/64 mmHg  Pulse 96  Temp(Src) 99.2 F (37.3 C) (Oral)  Resp 17  SpO2 96%  LMP 05/27/2016   Physical Exam  Constitutional: She is oriented to person, place, and  time. She appears well-developed and well-nourished. No distress.  HENT:  Head: Atraumatic.  occipital wound appears to be healing well, no erythema or drainage, diffuse TTP about occiput and cervical spine region.   Eyes: Conjunctivae and EOM are normal.  Neck: Neck supple. No tracheal deviation present.  Cardiovascular: Normal rate.   Pulmonary/Chest: Effort normal. No respiratory distress.  Musculoskeletal: Normal range of motion.  Neurological: She is alert and oriented to person, place, and time.  Motor strength 5/5 in bilateral upper extremities.   Skin: Skin is warm and dry.  Psychiatric: She has a normal mood and affect. Her behavior is normal.  Nursing note and vitals reviewed.   ED Course  Procedures (including critical care time) DIAGNOSTIC STUDIES: Oxygen Saturation is 95% on RA, normal by my interpretation.    COORDINATION OF CARE: 11:18 PM-Discussed treatment plan which includes pain control  with pt at bedside and pt agreed to plan.     Labs Review Labs Reviewed - No data to display  Imaging Review Ct Cervical Spine Wo Contrast  06/18/2016  CLINICAL DATA:  Acute onset of posterior headache. Recent decompression for Chiari malformation. Initial encounter. EXAM: CT CERVICAL SPINE WITHOUT CONTRAST TECHNIQUE: Multidetector CT imaging of the cervical spine was performed without intravenous contrast. Multiplanar CT image reconstructions were also generated. COMPARISON:  MRI of the brain performed 01/04/2016 FINDINGS: There is no evidence of fracture or subluxation. The patient is status post partial resection at the occiput and along the posterior arches of C1 and C2. Trace postoperative air is noted at the occipital defect, with overlying skin staples and soft tissue edema. Vertebral bodies demonstrate normal height and alignment. Intervertebral disc spaces are preserved. Prevertebral soft tissues are within normal limits. The visualized neural foramina are grossly  unremarkable. The thyroid gland is unremarkable in appearance. The visualized lung apices are clear. No significant soft tissue abnormalities are seen. The visualized portions of the brain are grossly unremarkable, though not fully assessed. IMPRESSION: 1. No evidence of acute fracture or subluxation along the cervical spine. 2. Status post partial resection at the occiput and along the posterior arches of C1 and C2. Trace postoperative air noted at the occipital defect, with overlying skin staples and soft tissue edema. Electronically Signed   By: Garald Balding M.D.   On: 06/18/2016 00:23      EKG Interpretation None      MDM  Patient with recent Chiari decompression surgery on 6/27 presents for evaluation of neck and right arm pain. She is nonfocal neuro exam, wound appears to be healing well. She is hemodynamically stable and afebrile. Pain managed in emergency department. She states she has had a 0/10 now after 1 dose  of IV morphine. CT cervical spine without any acute changes. Discussed with Dr. Darl Householder. Patient be discharged home to follow-up with her surgeon. States she will call tomorrow. She ambulates out of the ED without any difficulty, gait baseline. Final diagnoses:  Postoperative pain     I personally performed the services described in this documentation, which was scribed in my presence. The recorded information has been reviewed and is accurate.      Comer Locket, PA-C 06/18/16 [id number]  Wandra Arthurs, MD 06/18/16 [phone]

## 2016-06-17 NOTE — ED Notes (Signed)
Bed: WA21 Expected date:  Expected time:  Means of arrival:  Comments: EMS pain management

## 2016-06-17 NOTE — ED Notes (Signed)
Bed: WA17 Expected date:  Expected time:  Means of arrival:  Comments: EMS 

## 2016-06-17 NOTE — ED Notes (Addendum)
Per EMS, pt complains of right shoulder pain since waking up today at 845pm. Pt was discharged from hospital on Friday after chiari decompression surgery. Pt denies injury to arm. Pt states she ran out of hydrocodone today, pt tried taking valium, which she states did not help.   Pt states she turned after waking up on couch and felt sudden pain in her right shoulder.

## 2016-06-18 MED ORDER — HYDROCODONE-ACETAMINOPHEN 5-325 MG PO TABS: 1.0000 | ORAL_TABLET | ORAL | Status: DC | PRN

## 2016-06-18 NOTE — Discharge Instructions (Signed)
Your exam and CT scans are very reassuring. Please follow-up with your surgeon tomorrow for reevaluation. Return to ED for new or worsening symptoms. Take your pain medicines as prescribed and as we discussed  Pain Medicine Instructions HOW CAN PAIN MEDICINE AFFECT ME? You were given a prescription for pain medicine. This medicine may make you tired or drowsy and may affect your ability to think clearly. Pain medicine may also affect your ability to drive or perform certain physical activities. It may not be possible to make all of your pain go away, but you should be comfortable enough to move, breathe, and take care of yourself. HOW OFTEN SHOULD I TAKE PAIN MEDICINE AND HOW MUCH SHOULD I TAKE?  Take pain medicine only as directed by your health care provider and only as needed for pain.  You do not need to take pain medicine if you are not having pain, unless directed by your health care provider.  You can take less than the prescribed dose if you find that a smaller amount of medicine controls your pain. WHAT RESTRICTIONS DO I HAVE WHILE TAKING PAIN MEDICINE? Follow these instructions after you start taking pain medicine, while you are taking the medicine, and for 8 hours after you stop taking the medicine:  Do not drive.  Do not operate machinery.  Do not operate power tools.  Do not sign legal documents.  Do not drink alcohol.  Do not take sleeping pills.  Do not supervise children by yourself.  Do not participate in activities that require climbing or being in high places.  Do not enter a body of water--such as a lake, river, ocean, spa, or swimming pool--without an adult nearby who can monitor and help you. HOW CAN I KEEP OTHERS SAFE WHILE I AM TAKING PAIN MEDICINE?  Store your pain medicine as directed by your health care provider. Make sure that it is placed where children and pets cannot reach it.  Never share your pain medicine with anyone.  Do not save any leftover  pills. If you have any leftover pain medicine, get rid of it or destroy it as directed by your health care provider. WHAT ELSE DO I NEED TO KNOW ABOUT TAKING PAIN MEDICINE?  Use a stool softener if you become constipated from your pain medicine. Increasing your intake of fruits and vegetables will also help with constipation.  Write down the times when you take your pain medicine. Look at the times before you take your next dose of medicine. It is easy to become confused while on pain medicine. Recording the times helps you to avoid an overdose.  If your pain is severe, do not try to treat it yourself by taking more pills than instructed on your prescription. Contact your health care provider for help.  You may have been prescribed a pain medicine that contains acetaminophen. Do not take any other acetaminophen while taking this medicine. An overdose of acetaminophen can result in severe liver damage. Acetaminophen is found in many over-the-counter (OTC) and prescription medicines. If you are taking any medicines in addition to your pain medicine, check the active ingredients on those medicines to see if acetaminophen is listed. WHEN SHOULD I CALL MY HEALTH CARE PROVIDER?  Your medicine is not helping to make the pain go away.  You vomit or have diarrhea shortly after taking the medicine.  You develop new pain in areas that did not hurt before.  You have an allergic reaction to your medicine. This may include:  Itchiness.  Swelling.  Dizziness.  Developing a new rash. WHEN SHOULD I CALL 911 OR GO TO THE EMERGENCY ROOM?  You feel dizzy or you faint.  You are very confused or disoriented.  You repeatedly vomit.  Your skin or lips turn pale or bluish in color.  You have shortness of breath or you are breathing much more slowly than usual.  You have a severe allergic reaction to your medicine. This includes:  Developing tongue swelling.  Having difficulty breathing.   This  information is not intended to replace advice given to you by your health care provider. Make sure you discuss any questions you have with your health care provider.   Document Released: 03/08/2001 Document Revised: 04/16/2015 Document Reviewed: 10/04/2014 Elsevier Interactive Patient Education [employer].

## 2016-07-20 ENCOUNTER — Ambulatory Visit (INDEPENDENT_AMBULATORY_CARE_PROVIDER_SITE_OTHER): Payer: Medicaid Other | Admitting: Pediatrics

## 2016-07-20 ENCOUNTER — Encounter: Payer: Self-pay | Admitting: Pediatrics

## 2016-07-20 VITALS — BP 112/69 | HR 84 | Temp 97.8°F | Ht 61.0 in | Wt 198.4 lb

## 2016-07-20 DIAGNOSIS — J4530 Mild persistent asthma, uncomplicated: Secondary | ICD-10-CM | POA: Diagnosis not present

## 2016-07-20 DIAGNOSIS — Z6837 Body mass index (BMI) 37.0-37.9, adult: Secondary | ICD-10-CM

## 2016-07-20 DIAGNOSIS — E669 Obesity, unspecified: Secondary | ICD-10-CM | POA: Insufficient documentation

## 2016-07-20 DIAGNOSIS — G935 Compression of brain: Secondary | ICD-10-CM | POA: Diagnosis not present

## 2016-07-20 LAB — BAYER DCA HB A1C WAIVED: HB A1C (BAYER DCA - WAIVED): 5.1 % (ref <7.0)

## 2016-07-20 NOTE — Progress Notes (Signed)
    Subjective:    Patient ID: Brenda Spencer, female    DOB: 1991-01-15, 25 y.o.   MRN: [id number]  CC: Follow-up multiple med problems  HPI: Brenda Spencer is a 25 y.o. female presenting for Follow-up  Neck is still stiff from the surgery Stopped the muscle relaxers and hydrocodone  Stopped acetazolamide she says per neurology  No headaches now Well healing incision Wants to get healthier  Interested in weight loss surgery but medicaid wont cover bariatric surgery  Breathing has been fine  MGM with colon ca at 25yo  Depression screen [facility] 2/9 07/20/2016 02/13/2016 12/20/2015 07/31/2015 07/16/2015  Decreased Interest 0 0 0 0 0  Down, Depressed, Hopeless 0 0 0 0 0  PHQ - 2 Score 0 0 0 0 0     Relevant past medical, surgical, family and social history reviewed and updated. Interim medical history since our last visit reviewed. Allergies and medications reviewed and updated.  History  Smoking Status  . Never Smoker  Smokeless Tobacco  . Never Used    ROS: Per HPI      Objective:    BP 112/69 (BP Location: Right Arm, Patient Position: Sitting, Cuff Size: Normal)   Pulse 84   Temp 97.8 F (36.6 C) (Oral)   Ht _0  (1.549 m)   Wt 198 lb 6.4 oz (90 kg)   BMI 37.49 kg/m   Wt Readings from Last 3 Encounters:  07/20/16 198 lb 6.4 oz (90 kg)  06/09/16 208 lb 8.9 oz (94.6 kg)  06/04/16 201 lb 9.6 oz (91.4 kg)     Gen: NAD, alert, cooperative with exam, NCAT EYES: EOMI, no scleral injection or icterus LYMPH: no cervical LAD CV: NRRR, normal S1/S2, no murmur, distal pulses 2+ b/l Resp: CTABL, no wheezes, normal WOB Abd: +BS, soft, NT, obese Ext: No edema, warm Neuro: Alert and oriented, strength equal b/l UE and LE, coordination grossly normal MSK: decreased ROM neck with turning head side to side Skin: occipital scar well healed     Assessment & Plan:    Trinda was seen today for follow-up multiple med problems  Diagnoses and all orders for this  visit:  BMI 37.0-37.9, adult Discussed lifestyle changes Increasing veg Decreasing carbs, pasta Stop sodas, sweetened beverages Walk every day Will see Tammy for nutrition counseling -     Bayer DCA Hb A1c Waived -     CMP14+EGFR -     Thyroid Panel With TSH  Arnold-Chiari malformation, type I ([place]) Healing well from surgery, headaches better Has f/u with NSG later this month, neurology in a couple of months  Mild persistent asthma, uncomplicated Symptoms well controlled Cont albuteorl prn  Follow up plan: Return for Nutrition appt wiht Tammy when able, f/u Dr. Evette Doffing 4 mo.  [name], MD [facility] 07/20/2016, 11:37 AM

## 2016-07-21 LAB — CMP14+EGFR
A/G RATIO: 2 (ref 1.2–2.2)
ALBUMIN: 4.3 g/dL (ref 3.5–5.5)
ALK PHOS: 105 IU/L (ref 39–117)
ALT: 35 IU/L — ABNORMAL HIGH (ref 0–32)
AST: 23 IU/L (ref 0–40)
BILIRUBIN TOTAL: 0.3 mg/dL (ref 0.0–1.2)
BUN / CREAT RATIO: 15 (ref 9–23)
BUN: 8 mg/dL (ref 6–20)
CHLORIDE: 102 mmol/L (ref 96–106)
CO2: 25 mmol/L (ref 18–29)
Calcium: 8.7 mg/dL (ref 8.7–10.2)
Creatinine, Ser: 0.52 mg/dL — ABNORMAL LOW (ref 0.57–1.00)
GFR calc Af Amer: 152 mL/min/{1.73_m2} (ref >59)
GFR calc non Af Amer: 132 mL/min/{1.73_m2} (ref >59)
GLUCOSE: 115 mg/dL — AB (ref 65–99)
Globulin, Total: 2.1 g/dL (ref 1.5–4.5)
POTASSIUM: 3.5 mmol/L (ref 3.5–5.2)
SODIUM: 140 mmol/L (ref 134–144)
Total Protein: 6.4 g/dL (ref 6.0–8.5)

## 2016-07-21 LAB — THYROID PANEL WITH TSH
FREE THYROXINE INDEX: 1.6 (ref 1.2–4.9)
T3 Uptake Ratio: 21 % — ABNORMAL LOW (ref 24–39)
T4, Total: 7.4 ug/dL (ref 4.5–12.0)
TSH: 0.913 u[IU]/mL (ref 0.450–4.500)

## 2016-07-31 ENCOUNTER — Telehealth: Payer: Self-pay | Admitting: Pediatrics

## 2016-07-31 NOTE — Telephone Encounter (Signed)
Patient aware of results.

## 2016-08-24 ENCOUNTER — Encounter: Payer: Medicaid Other | Admitting: Pharmacist

## 2016-09-03 ENCOUNTER — Encounter: Payer: Medicaid Other | Admitting: Pharmacist

## 2016-09-09 ENCOUNTER — Encounter: Payer: Self-pay | Admitting: Pharmacist

## 2016-09-09 ENCOUNTER — Ambulatory Visit (INDEPENDENT_AMBULATORY_CARE_PROVIDER_SITE_OTHER): Payer: Medicaid Other | Admitting: Pharmacist

## 2016-09-09 VITALS — BP 122/70 | HR 82 | Ht 61.0 in | Wt 200.0 lb

## 2016-09-09 DIAGNOSIS — Z6837 Body mass index (BMI) 37.0-37.9, adult: Secondary | ICD-10-CM

## 2016-09-09 DIAGNOSIS — R7989 Other specified abnormal findings of blood chemistry: Secondary | ICD-10-CM | POA: Diagnosis not present

## 2016-09-09 DIAGNOSIS — Z23 Encounter for immunization: Secondary | ICD-10-CM | POA: Diagnosis not present

## 2016-09-09 DIAGNOSIS — R945 Abnormal results of liver function studies: Secondary | ICD-10-CM

## 2016-09-09 NOTE — Patient Instructions (Signed)
  Continue to drink  80 oz of water per day. Avoid sugar containing beverages - sweet tea or soda  Don't eat 3-4 hours before your bed time.  Consider low impact exercise options: water aerobics, yoga, piliates. Check You Tube for samples exercise programs.  Exercise at least 4 times a week for 30 minutes. Focus on your target heart rate.Add resistance training to your exercise plan.   Continue to keep track of all food intake and exercise in diary.  Try pedometer to keep track of steps.  Don't skip meals, try a protein shake instead - [employer].   Ask about increased liver enzymes and fatty liver
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
## 2016-09-16 ENCOUNTER — Ambulatory Visit (INDEPENDENT_AMBULATORY_CARE_PROVIDER_SITE_OTHER): Payer: Medicaid Other | Admitting: Pediatrics

## 2016-09-16 ENCOUNTER — Encounter: Payer: Self-pay | Admitting: Pediatrics

## 2016-09-16 VITALS — BP 107/68 | HR 77 | Temp 98.0°F | Ht 61.0 in | Wt 201.6 lb

## 2016-09-16 DIAGNOSIS — E669 Obesity, unspecified: Secondary | ICD-10-CM | POA: Diagnosis not present

## 2016-09-16 DIAGNOSIS — R740 Nonspecific elevation of levels of transaminase and lactic acid dehydrogenase [LDH]: Secondary | ICD-10-CM

## 2016-09-16 DIAGNOSIS — R7401 Elevation of levels of liver transaminase levels: Secondary | ICD-10-CM

## 2016-09-16 DIAGNOSIS — M545 Low back pain, unspecified: Secondary | ICD-10-CM

## 2016-09-16 MED ORDER — NAPROXEN 500 MG PO TABS: 500.0000 mg | ORAL_TABLET | Freq: Two times a day (BID) | ORAL | 0 refills | Status: DC

## 2016-09-16 NOTE — Progress Notes (Signed)
  Subjective:   Patient ID: Brenda Spencer, female    DOB: 09/26/1991, 25 y.o.   MRN: [id number] CC: Follow-up and Back Pain  HPI: Brenda Spencer is a 25 y.o. female presenting for Follow-up and Back Pain  Back pain Started having for 2-3 years Comes and goes Started yesterday again Took a muscle relaxer  Was taking ibuprofen Back pain lasts for 2-3 days up to a week at a time  Continues to work on weight loss Has been ongoing issue since she was a teenager Now being seen through [place] [facility] Not drinking sodas Drinking more water Exercising some, doing some yoga and pilates before back started hurting Eating eggs, yogurt in the morning Salad or sandwich for lunch Mainly chicken, baked or pork chops for dinner Eats fruit cup or banana or apple Following at [facility] for weight loss  Mulit[place] times has had elevated liver enzymes Has had gallbladder removed Has RUQ pain at times, she says often associated with her back pain  Relevant past medical, surgical, family and social history reviewed. Allergies and medications reviewed and updated. History  Smoking Status  . Never Smoker  Smokeless Tobacco  . Never Used   ROS: Per HPI   Objective:    BP 107/68   Pulse 77   Temp 98 F (36.7 C) (Oral)   Ht 5\' 1"  (1.549 m)   Wt 201 lb 9.6 oz (91.4 kg)   BMI 38.09 kg/m   Wt Readings from Last 3 Encounters:  09/16/16 201 lb 9.6 oz (91.4 kg)  09/09/16 200 lb (90.7 kg)  07/20/16 198 lb 6.4 oz (90 kg)    Gen: NAD, alert, cooperative with exam, NCAT, centrally placed adiposity EYES: EOMI, no conjunctival injection, or no icterus ENT:  TMs pearly gray b/l, OP without erythema LYMPH: no cervical LAD CV: NRRR, normal S1/S2, no murmur, distal pulses 2+ b/l Resp: CTABL, no wheezes, normal WOB Abd: +BS, soft, TTP RUQ, neg murphys  Sign, ND. no guarding or organomegaly Ext: No edema, warm Neuro: Alert and oriented, strength equal b/l UE and LE, coordination grossly  normal MSK: tenderness R paraspinous muscles with superficial palpation, tender L side as well, not as much as right. +SLR R>L  Assessment & Plan:  Chene was seen today for follow-up and back pain.  Diagnoses and all orders for this visit:  Transaminitis Also TTP RUQ S/p gall bladder removal Has had midly elevated liver enzymes last checks -     US Abdomen Limited RUQ; Future  Acute right-sided low back pain without sciatica NSAIDs, ice, heat, gentle back exercises -     naproxen (NAPROSYN) 500 MG tablet; Take 1 tablet (500 mg total) by mouth 2 (two) times daily with a meal.  Obesity, Class II, BMI 35-39.9 Cont to follow with baritric clinic Cont lifestyle changes  Follow up plan: Return in about 3 months (around 12/17/2016). [name], MD [place]

## 2016-09-16 NOTE — Patient Instructions (Addendum)

## 2016-09-17 ENCOUNTER — Ambulatory Visit: Payer: Medicaid Other | Admitting: Pediatrics

## 2016-09-17 ENCOUNTER — Ambulatory Visit
Admission: RE | Admit: 2016-09-17 | Discharge: 2016-09-17 | Disposition: A | Discharge status: Home / Self Care | Payer: Medicaid Other | Source: Ambulatory Visit | Attending: Pediatrics | Admitting: Pediatrics

## 2016-09-17 DIAGNOSIS — R7401 Elevation of levels of liver transaminase levels: Secondary | ICD-10-CM

## 2016-09-17 DIAGNOSIS — R740 Nonspecific elevation of levels of transaminase and lactic acid dehydrogenase [LDH]: Principal | ICD-10-CM

## 2016-09-24 IMAGING — CT CT CERVICAL SPINE W/O CM
3 of 4 series · 14 of 33 positions shown, 17 images · non-contrast
Comparison: MRI of the brain performed 01/04/2016

CLINICAL DATA: Acute onset of posterior headache. Recent
decompression for Chiari malformation. Initial encounter.

EXAM:
CT CERVICAL SPINE WITHOUT CONTRAST
TECHNIQUE: Multidetector CT imaging of the cervical spine was performed without
intravenous contrast. Multiplanar CT image reconstructions were also
generated.

[Series 6: axial reformats · axial · 0.23mm/px · z∈[+1049,+1181]mm · 6 of 98 slices shown, 8 images]
[im 14/98  soft-tissue]
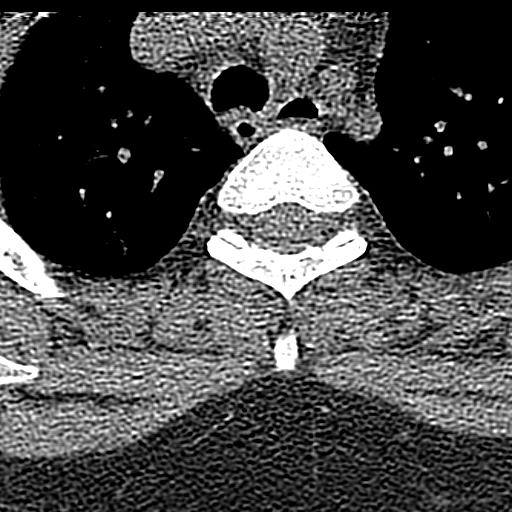
[im 14/98  bone]
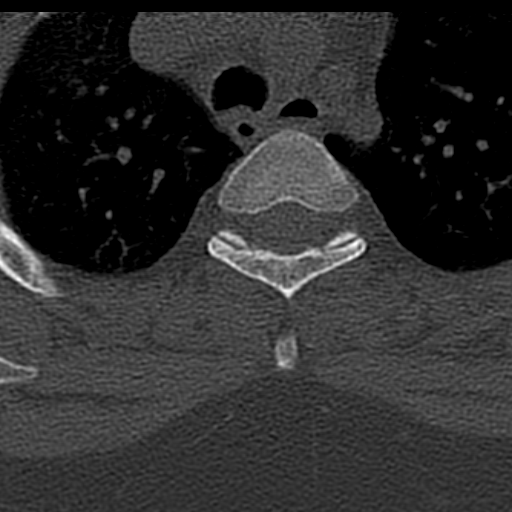
[im 28/98  bone]
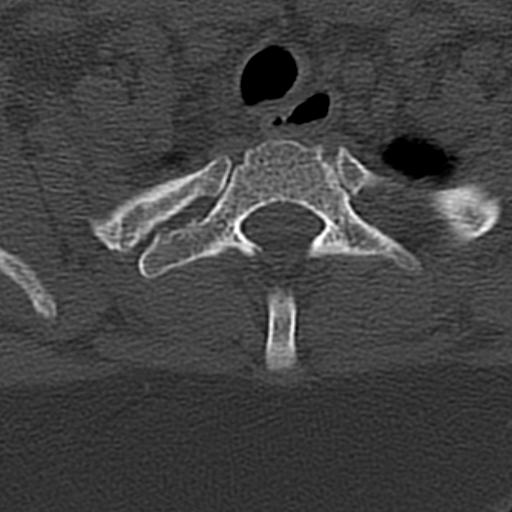
[im 42/98  bone]
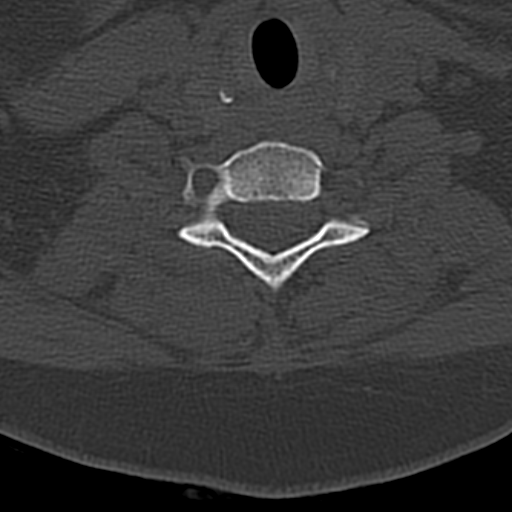
[im 56/98  bone]
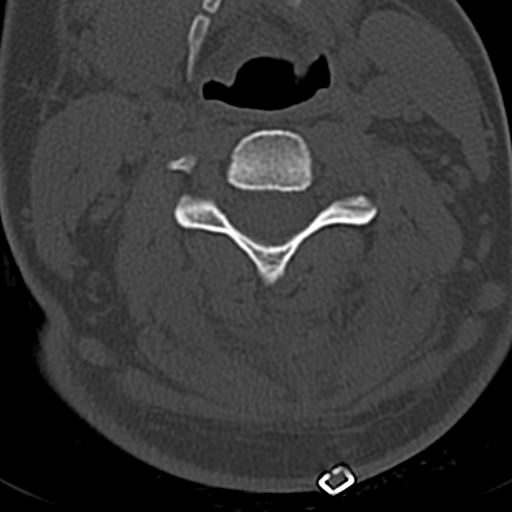
[im 70/98  soft-tissue]
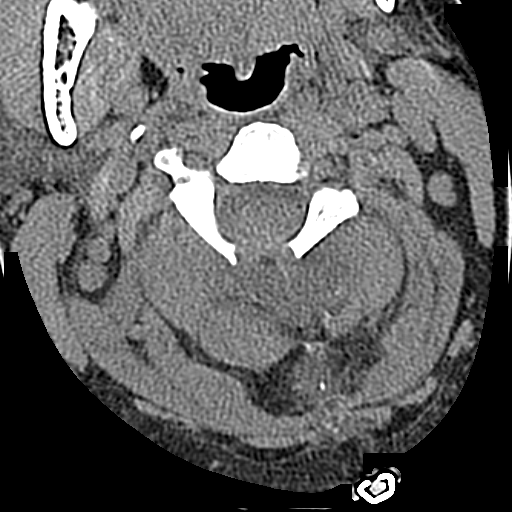
[im 70/98  bone]
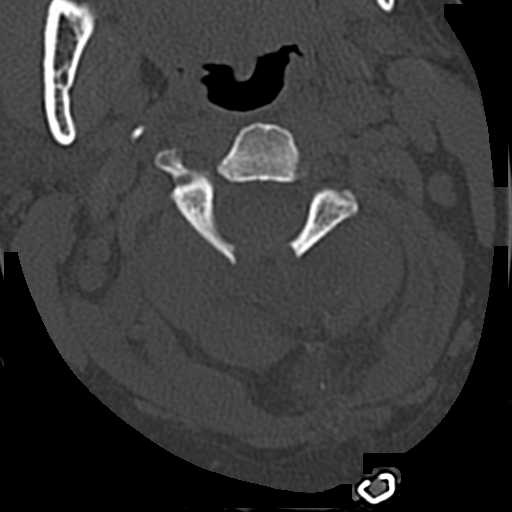
[im 84/98  bone]
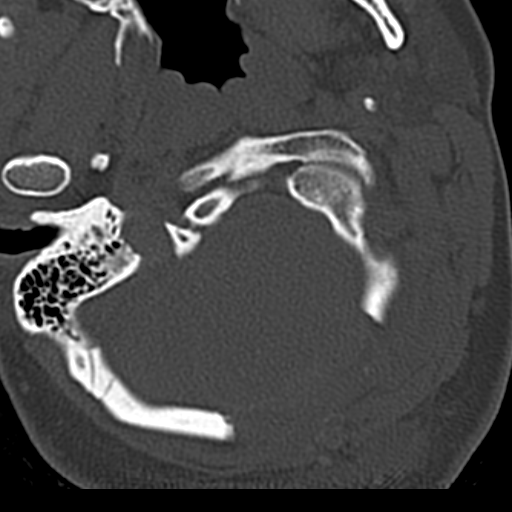

[Series 7: coronal recons · coronal · 0.24mm/px · 3 of 60 slices shown]
[im 12/60  bone]
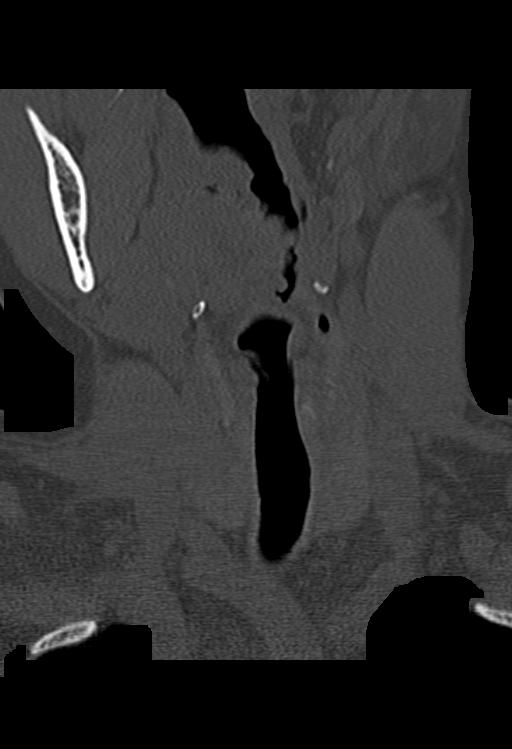
[im 24/60  bone]
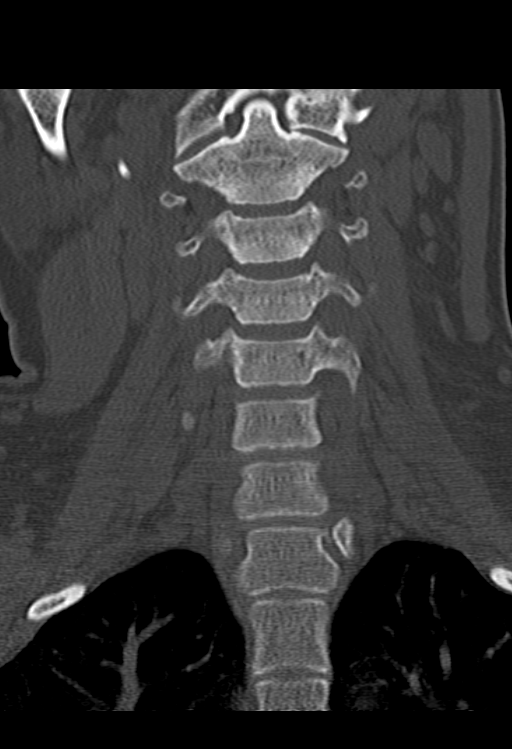
[im 36/60  bone]
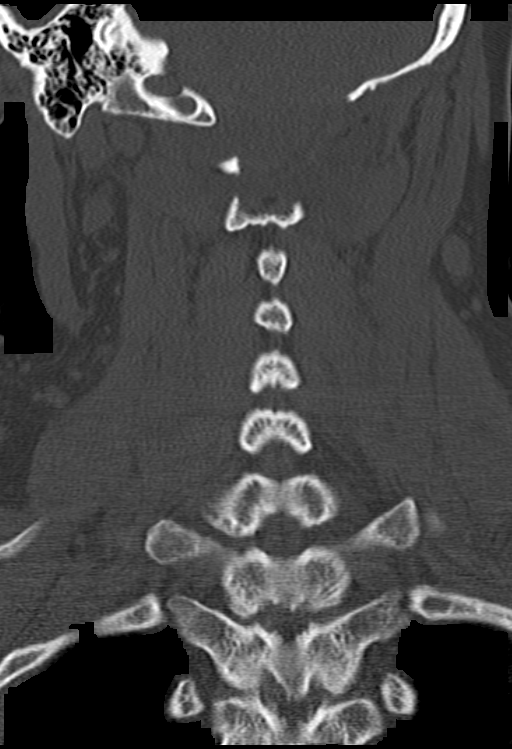

[Series 8: sagittal recons · sagittal · 0.29mm/px · 5 of 61 slices shown, 6 images]
[im 21/61  bone]
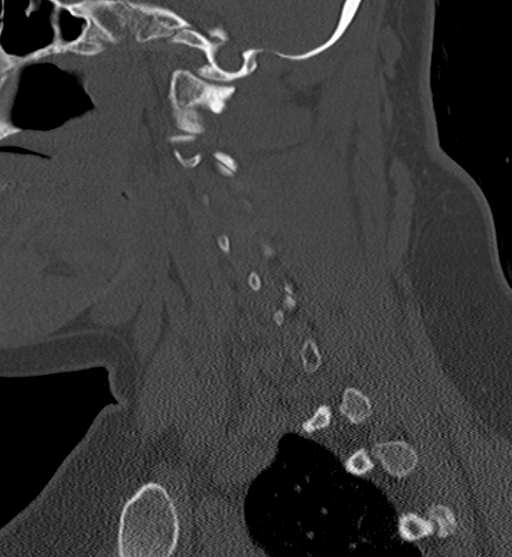
[im 26/61  bone]
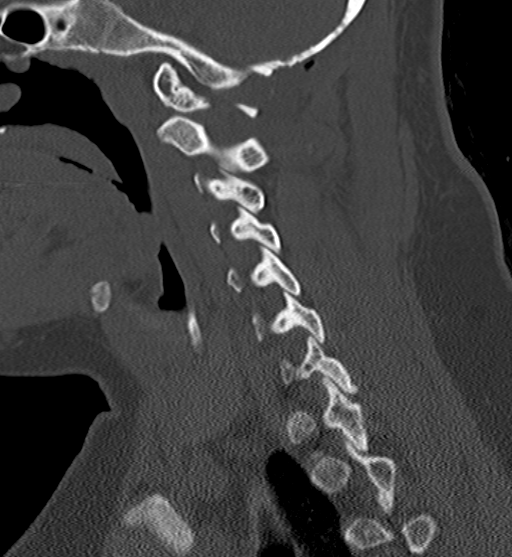
[im 31/61  soft-tissue]
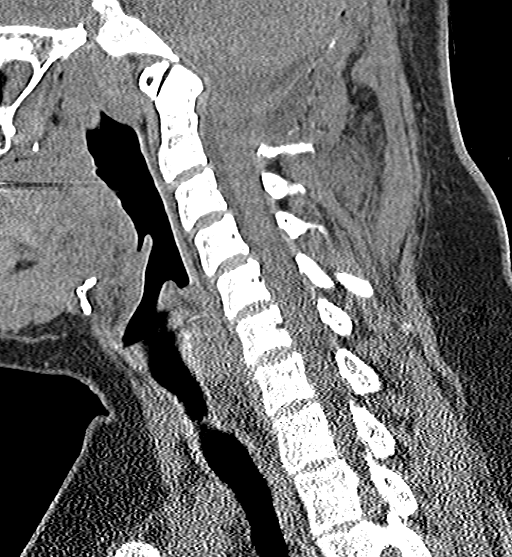
[im 31/61  bone]
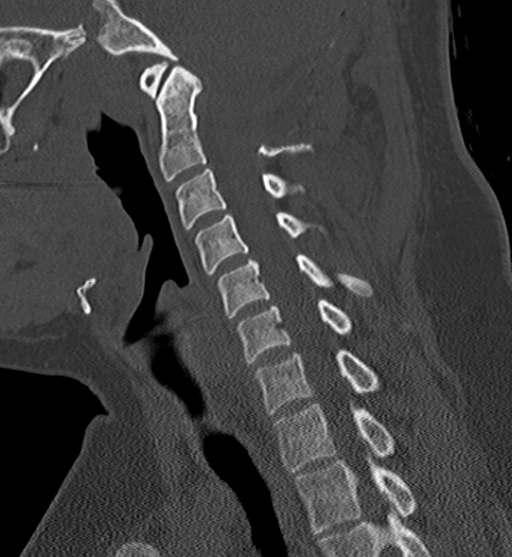
[im 36/61  bone]
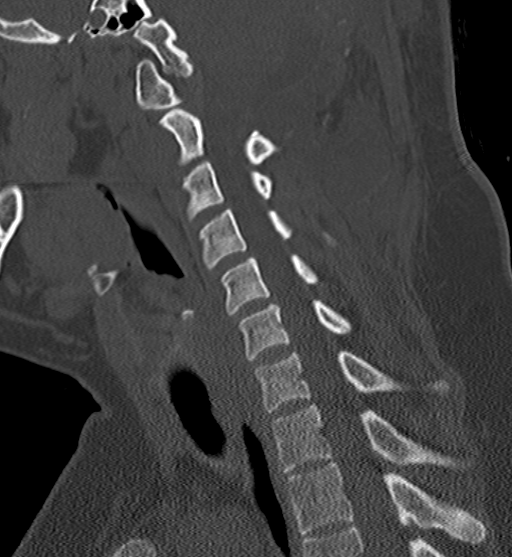
[im 41/61  bone]
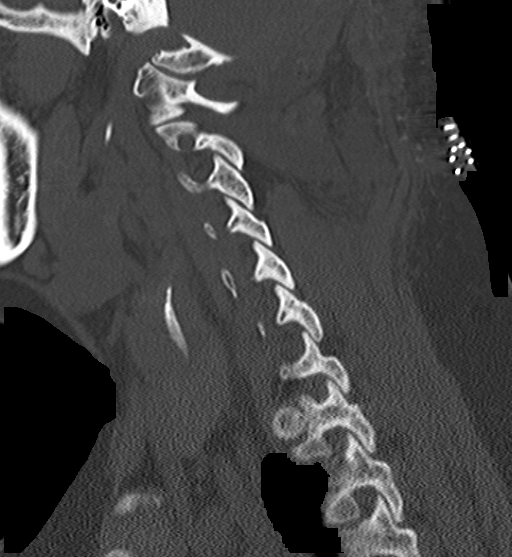

[14 of 33 positions shown; findings below may reference images not displayed]

FINDINGS: There is no evidence of fracture or subluxation. The patient is
status post partial resection at the occiput and along the posterior
arches of C1 and C2. Trace postoperative air is noted at the
occipital defect, with overlying skin staples and soft tissue edema.

Vertebral bodies demonstrate normal height and alignment.
Intervertebral disc spaces are preserved. Prevertebral soft tissues
are within normal limits. The visualized neural foramina are grossly
unremarkable.

The thyroid gland is unremarkable in appearance. The visualized lung
apices are clear. No significant soft tissue abnormalities are seen.
The visualized portions of the brain are grossly unremarkable,
though not fully assessed.
IMPRESSION: 1. No evidence of acute fracture or subluxation along the cervical
spine.
2. Status post partial resection at the occiput and along the
posterior arches of C1 and C2. Trace postoperative air noted at the
occipital defect, with overlying skin staples and soft tissue edema.

## 2016-09-28 ENCOUNTER — Ambulatory Visit: Payer: Medicaid Other | Admitting: Neurology

## 2016-09-30 ENCOUNTER — Encounter: Payer: Self-pay | Admitting: Pediatrics

## 2016-09-30 ENCOUNTER — Ambulatory Visit (INDEPENDENT_AMBULATORY_CARE_PROVIDER_SITE_OTHER): Payer: Medicaid Other | Admitting: Pediatrics

## 2016-09-30 VITALS — BP 109/73 | HR 81 | Temp 97.5°F | Ht 61.0 in | Wt 203.2 lb

## 2016-09-30 DIAGNOSIS — B9789 Other viral agents as the cause of diseases classified elsewhere: Secondary | ICD-10-CM

## 2016-09-30 DIAGNOSIS — J029 Acute pharyngitis, unspecified: Secondary | ICD-10-CM | POA: Diagnosis not present

## 2016-09-30 DIAGNOSIS — G8929 Other chronic pain: Secondary | ICD-10-CM

## 2016-09-30 DIAGNOSIS — M545 Low back pain, unspecified: Secondary | ICD-10-CM | POA: Insufficient documentation

## 2016-09-30 DIAGNOSIS — J4530 Mild persistent asthma, uncomplicated: Secondary | ICD-10-CM | POA: Diagnosis not present

## 2016-09-30 DIAGNOSIS — J069 Acute upper respiratory infection, unspecified: Secondary | ICD-10-CM | POA: Diagnosis not present

## 2016-09-30 DIAGNOSIS — L0291 Cutaneous abscess, unspecified: Secondary | ICD-10-CM

## 2016-09-30 DIAGNOSIS — E669 Obesity, unspecified: Secondary | ICD-10-CM

## 2016-09-30 LAB — RAPID STREP SCREEN (MED CTR MEBANE ONLY): Strep Gp A Ag, IA W/Reflex: NEGATIVE

## 2016-09-30 LAB — CULTURE, GROUP A STREP

## 2016-09-30 MED ORDER — GUAIFENESIN-CODEINE 100-10 MG/5ML PO SOLN: 5.0000 mL | Freq: Three times a day (TID) | ORAL | 0 refills | Status: DC | PRN

## 2016-09-30 MED ORDER — MUPIROCIN 2 % EX OINT: TOPICAL_OINTMENT | CUTANEOUS | 1 refills | Status: DC

## 2016-09-30 NOTE — Progress Notes (Signed)
  Subjective:   Patient ID: Brenda Spencer, female    DOB: August 13, 1991, 25 y.o.   MRN: [id number] CC: Cough; Sore Throat; and Ear Pain (R)  HPI: Brenda Spencer is a 25 y.o. female presenting for Cough; Sore Throat; and Ear Pain (R) symptoms ongoing past 3 days Coughing  All eight kids at home sick  Some low back pain, ongoing for years Walking makes it worse  Continues to follow with bariatric surgery Weight up and down Decreasing sugar intake Trying to exercise more  Has red spots on her lower abdomen, tender  Relevant past medical, surgical, family and social history reviewed. Allergies and medications reviewed and updated. History  Smoking Status  . Never Smoker  Smokeless Tobacco  . Never Used   ROS: Per HPI   Objective:    BP 109/73   Pulse 81   Temp 97.5 F (36.4 C) (Oral)   Ht 5\' 1"  (1.549 m)   Wt 203 lb 3.2 oz (92.2 kg)   BMI 38.39 kg/m   Wt Readings from Last 3 Encounters:  09/30/16 203 lb 3.2 oz (92.2 kg)  09/16/16 201 lb 9.6 oz (91.4 kg)  09/09/16 200 lb (90.7 kg)    Gen: NAD, alert, cooperative with exam, NCAT, congested EYES: EOMI, no conjunctival injection, or no icterus ENT:  TMs pearly gray b/l, OP without erythema LYMPH: no cervical LAD CV: NRRR, normal S1/S2, no murmur, distal pulses 2+ b/l Resp: CTABL, no wheezes, normal WOB Abd: +BS, soft, NTND. no guarding or organomegaly Ext: No edema, warm Neuro: Alert and oriented MSK: neg SLR, TTP paraspinous muscles Skin: a few small 1-92mm red papules lower abdomen  Assessment & Plan:  Brenda Spencer was seen today for cough, sore throat and ear pain., f/u med problems  Diagnoses and all orders for this visit:  Viral URI with cough -     guaiFENesin-codeine 100-10 MG/5ML syrup; Take 5 mLs by mouth 3 (three) times daily as needed for cough.  Chronic bilateral low back pain without sciatica NSAIDs prn, rest, ice, heat  Abscess A couple of red papules lower abd, one draining. No fluctuance Warm  compresses and abx ointment -     mupirocin ointment (BACTROBAN) 2 %; Use twice a day on affected areas  Sore throat Neg, will send for culture -     Rapid strep screen (not at [facility])  Obesity, Class II, BMI 35-39.9 Following with bariatrics Has letter for support to fill out for possible bariatric surgery Pt is motivated to lose weight, has been trying for years with minimal success Weight is starting to cause other problems such as low back pain  Mild persistent asthma without complication Well controlled, cont albuteorl prn  Follow up plan: 6 mo Brenda Found, MD [place]

## 2016-10-02 LAB — CULTURE, GROUP A STREP: STREP A CULTURE: NEGATIVE

## 2016-11-19 ENCOUNTER — Ambulatory Visit: Payer: Medicaid Other | Admitting: Pediatrics

## 2016-12-10 ENCOUNTER — Ambulatory Visit (INDEPENDENT_AMBULATORY_CARE_PROVIDER_SITE_OTHER): Payer: Medicaid Other | Admitting: Pediatrics

## 2016-12-10 ENCOUNTER — Encounter: Payer: Self-pay | Admitting: Pediatrics

## 2016-12-10 VITALS — BP 121/85 | HR 128 | Temp 100.7°F | Ht 61.0 in | Wt 206.8 lb

## 2016-12-10 DIAGNOSIS — J069 Acute upper respiratory infection, unspecified: Secondary | ICD-10-CM

## 2016-12-10 DIAGNOSIS — J101 Influenza due to other identified influenza virus with other respiratory manifestations: Secondary | ICD-10-CM | POA: Diagnosis not present

## 2016-12-10 DIAGNOSIS — R6889 Other general symptoms and signs: Secondary | ICD-10-CM

## 2016-12-10 DIAGNOSIS — B9789 Other viral agents as the cause of diseases classified elsewhere: Secondary | ICD-10-CM

## 2016-12-10 LAB — VERITOR FLU A/B WAIVED
INFLUENZA A: POSITIVE — AB
INFLUENZA B: NEGATIVE

## 2016-12-10 MED ORDER — OSELTAMIVIR PHOSPHATE 75 MG PO CAPS: 75.0000 mg | ORAL_CAPSULE | Freq: Two times a day (BID) | ORAL | 0 refills | Status: DC

## 2016-12-10 MED ORDER — GUAIFENESIN-CODEINE 100-10 MG/5ML PO SOLN: 5.0000 mL | Freq: Three times a day (TID) | ORAL | 0 refills | Status: DC | PRN

## 2016-12-10 NOTE — Progress Notes (Signed)
  Subjective:   Patient ID: Brenda Spencer, female    DOB: December 19, 1990, 25 y.o.   MRN: [id number] CC: Cough; Nasal Congestion; and Sore Throat  HPI: Brenda Spencer is a 25 y.o. female presenting for Cough; Nasal Congestion; and Sore Throat  Taking some cough medicine Started getting sick yesterday, cough started Fever last night, subjective Some sore throat Cough keeping her awake Has some chills, body aches Drinking plenty of fluids  Relevant past medical, surgical, family and social history reviewed. Allergies and medications reviewed and updated. History  Smoking Status  . Never Smoker  Smokeless Tobacco  . Never Used   ROS: Per HPI   Objective:    BP 121/85   Pulse (!) 128   Temp (!) 100.7 F (38.2 C) (Oral)   Ht 5\' 1"  (1.549 m)   Wt 206 lb 12.8 oz (93.8 kg)   BMI 39.07 kg/m   Wt Readings from Last 3 Encounters:  12/10/16 206 lb 12.8 oz (93.8 kg)  09/30/16 203 lb 3.2 oz (92.2 kg)  09/16/16 201 lb 9.6 oz (91.4 kg)    Gen: NAD, alert, cooperative with exam, NCAT, congested EYES: EOMI, no conjunctival injection, or no icterus ENT:  TMs b/l pink, OP with mild erythema LYMPH: no cervical LAD CV: NRRR, normal S1/S2, no murmur, distal pulses 2+ b/l Resp: CTABL, no wheezes, normal WOB Abd: +BS, soft, NTND. no guarding or organomegaly Ext: No edema, warm Neuro: Alert and oriented MSK: normal muscle bulk  Assessment & Plan:  Brenda Spencer was seen today for cough, nasal congestion and sore throat.  Diagnoses and all orders for this visit:  Flu-like symptoms Flu positive Treat as below Use cough medicine as prescribed prn Keep out of reach of children Tylenol and ibuprofen as needed for aches/chills -     Veritor Flu A/B Waived  Viral URI with cough -     guaiFENesin-codeine 100-10 MG/5ML syrup; Take 5-10 mLs by mouth 3 (three) times daily as needed for cough.  Influenza A -     oseltamivir (TAMIFLU) 75 MG capsule; Take 1 capsule (75 mg total) by mouth 2  (two) times daily.   Follow up plan: prn [name], MD [place]

## 2016-12-17 ENCOUNTER — Telehealth: Payer: Self-pay | Admitting: Pediatrics

## 2016-12-17 NOTE — Telephone Encounter (Signed)
Aware, keep three month recheck appointment.

## 2016-12-18 ENCOUNTER — Ambulatory Visit: Payer: Medicaid Other | Admitting: Pediatrics

## 2016-12-24 ENCOUNTER — Ambulatory Visit (INDEPENDENT_AMBULATORY_CARE_PROVIDER_SITE_OTHER): Payer: Medicaid Other | Admitting: Pediatrics

## 2016-12-24 ENCOUNTER — Encounter: Payer: Self-pay | Admitting: Pediatrics

## 2016-12-24 VITALS — BP 109/76 | HR 81 | Temp 97.8°F | Ht 61.0 in | Wt 209.0 lb

## 2016-12-24 DIAGNOSIS — G935 Compression of brain: Secondary | ICD-10-CM | POA: Diagnosis not present

## 2016-12-24 DIAGNOSIS — Z6839 Body mass index (BMI) 39.0-39.9, adult: Secondary | ICD-10-CM

## 2016-12-24 DIAGNOSIS — J4530 Mild persistent asthma, uncomplicated: Secondary | ICD-10-CM

## 2016-12-24 DIAGNOSIS — M545 Low back pain: Secondary | ICD-10-CM | POA: Diagnosis not present

## 2016-12-24 DIAGNOSIS — Z7722 Contact with and (suspected) exposure to environmental tobacco smoke (acute) (chronic): Secondary | ICD-10-CM

## 2016-12-24 DIAGNOSIS — G8929 Other chronic pain: Secondary | ICD-10-CM

## 2016-12-24 MED ORDER — ALBUTEROL SULFATE (2.5 MG/3ML) 0.083% IN NEBU: 2.5000 mg | INHALATION_SOLUTION | Freq: Four times a day (QID) | RESPIRATORY_TRACT | 1 refills | Status: DC | PRN

## 2016-12-24 NOTE — Progress Notes (Signed)
  Subjective:   Patient ID: Brenda Spencer, female    DOB: [birth year], 26 y.o.   MRN: [id number] CC: Follow-up (3 month) multiple med problems HPI: Brenda Spencer is a 26 y.o. female presenting for Follow-up (3 month)  Has been following with bariatrics for weight loss, possible bariatric surgery upcoming No sugary beverages Regularly exercising Weight goes up and down a few pounds at a time Has been happy with lifestyle change,s is very optimistic about weight loss and possible weight loss surgery No more headaches since the surgery  Breathing has been good Family members have bene smoking outside  [place] robaxin twice for lower back pain Has been helping when she needs it  Relevant past medical, surgical, family and social history reviewed. Allergies and medications reviewed and updated. History  Smoking Status  . Never Smoker  Smokeless Tobacco  . Never Used   ROS: Per HPI   Objective:    BP 109/76   Pulse 81   Temp 97.8 F (36.6 C) (Oral)   Ht 5\' 1"  (1.549 m)   Wt 209 lb (94.8 kg)   BMI 39.49 kg/m   Wt Readings from Last 3 Encounters:  12/24/16 209 lb (94.8 kg)  12/10/16 206 lb 12.8 oz (93.8 kg)  09/30/16 203 lb 3.2 oz (92.2 kg)    Gen: NAD, alert, cooperative with exam, NCAT EYES: EOMI, no conjunctival injection, or no icterus ENT:  TMs pearly gray b/l, OP without erythema LYMPH: no cervical LAD CV: NRRR, normal S1/S2, no murmur, distal pulses 2+ b/l Resp: CTABL, no wheezes, normal WOB Abd: +BS, soft, NTND. no guarding or organomegaly Ext: No edema, warm Neuro: Alert and oriented MSK: normal muscle bulk  Assessment & Plan:  Brenda Spencer was seen today for follow-up multiple med problems  Diagnoses and all orders for this visit:  Mild persistent asthma without complication Stable, not needed albuteorl recently Less exposure to cig smoke, new baby in the house and family members smoking outside -     albuterol (PROVENTIL) (2.5 MG/3ML) 0.083% nebulizer  solution; Take 3 mLs (2.5 mg total) by nebulization every 6 (six) hours as needed for wheezing or shortness of breath.  Adult BMI 39.0-39.9 kg/sq m Cont lifestyle changes, increased activity Following with bariatrics  Exposure to second hand tobacco smoke Able to avoid bette,r breathing has been better  Chiari I malformation (HCC) No HA since repair  Chronic bilateral low back pain without sciatica Takes muscle relaxant rarely, does help with pain  Follow up plan: Return in about 6 months (around 06/23/2017) for CPE, pap smear. [name], MD [place]

## 2017-01-18 HISTORY — PX: SLEEVE GASTROPLASTY: SHX1101

## 2017-01-26 DIAGNOSIS — K912 Postsurgical malabsorption, not elsewhere classified: Secondary | ICD-10-CM | POA: Insufficient documentation

## 2017-03-07 ENCOUNTER — Emergency Department (HOSPITAL_BASED_OUTPATIENT_CLINIC_OR_DEPARTMENT_OTHER)
Admission: EM | Admit: 2017-03-07 | Discharge: 2017-03-07 | Disposition: A | Discharge status: Home / Self Care | Payer: Medicaid Other | Attending: Emergency Medicine | Admitting: Emergency Medicine

## 2017-03-07 ENCOUNTER — Encounter (HOSPITAL_BASED_OUTPATIENT_CLINIC_OR_DEPARTMENT_OTHER): Payer: Self-pay | Admitting: Emergency Medicine

## 2017-03-07 DIAGNOSIS — M545 Low back pain, unspecified: Secondary | ICD-10-CM

## 2017-03-07 DIAGNOSIS — Z79899 Other long term (current) drug therapy: Secondary | ICD-10-CM | POA: Insufficient documentation

## 2017-03-07 DIAGNOSIS — I509 Heart failure, unspecified: Secondary | ICD-10-CM | POA: Diagnosis not present

## 2017-03-07 DIAGNOSIS — R42 Dizziness and giddiness: Secondary | ICD-10-CM | POA: Diagnosis present

## 2017-03-07 DIAGNOSIS — J45909 Unspecified asthma, uncomplicated: Secondary | ICD-10-CM | POA: Diagnosis not present

## 2017-03-07 DIAGNOSIS — E876 Hypokalemia: Secondary | ICD-10-CM

## 2017-03-07 DIAGNOSIS — R110 Nausea: Secondary | ICD-10-CM

## 2017-03-07 LAB — URINALYSIS, ROUTINE W REFLEX MICROSCOPIC
Glucose, UA: NEGATIVE mg/dL
Hgb urine dipstick: NEGATIVE
Ketones, ur: NEGATIVE mg/dL
NITRITE: NEGATIVE
Protein, ur: NEGATIVE mg/dL
SPECIFIC GRAVITY, URINE: 1.025 (ref 1.005–1.030)
pH: 6.5 (ref 5.0–8.0)

## 2017-03-07 LAB — CBC WITH DIFFERENTIAL/PLATELET
BASOS ABS: 0 10*3/uL (ref 0.0–0.1)
BASOS PCT: 0 %
EOS ABS: 0.2 10*3/uL (ref 0.0–0.7)
Eosinophils Relative: 4 %
HCT: 36.4 % (ref 36.0–46.0)
HEMOGLOBIN: 11.5 g/dL — AB (ref 12.0–15.0)
Lymphocytes Relative: 28 %
Lymphs Abs: 1.7 10*3/uL (ref 0.7–4.0)
MCH: 27.4 pg (ref 26.0–34.0)
MCHC: 31.6 g/dL (ref 30.0–36.0)
MCV: 86.7 fL (ref 78.0–100.0)
Monocytes Absolute: 0.6 10*3/uL (ref 0.1–1.0)
Monocytes Relative: 9 %
NEUTROS ABS: 3.6 10*3/uL (ref 1.7–7.7)
Neutrophils Relative %: 59 %
Platelets: 231 10*3/uL (ref 150–400)
RBC: 4.2 MIL/uL (ref 3.87–5.11)
RDW: 15.6 % — ABNORMAL HIGH (ref 11.5–15.5)
WBC: 6.2 10*3/uL (ref 4.0–10.5)

## 2017-03-07 LAB — COMPREHENSIVE METABOLIC PANEL
ALK PHOS: 79 U/L (ref 38–126)
ALT: 41 U/L (ref 14–54)
ANION GAP: 6 (ref 5–15)
AST: 34 U/L (ref 15–41)
Albumin: 3.5 g/dL (ref 3.5–5.0)
BUN: 7 mg/dL (ref 6–20)
CALCIUM: 8.6 mg/dL — AB (ref 8.9–10.3)
CO2: 26 mmol/L (ref 22–32)
CREATININE: 0.47 mg/dL (ref 0.44–1.00)
Chloride: 109 mmol/L (ref 101–111)
GFR calc Af Amer: >60 mL/min (ref >60)
Glucose, Bld: 95 mg/dL (ref 65–99)
Potassium: 3.1 mmol/L — ABNORMAL LOW (ref 3.5–5.1)
Sodium: 141 mmol/L (ref 135–145)
Total Bilirubin: 0.5 mg/dL (ref 0.3–1.2)
Total Protein: 6.3 g/dL — ABNORMAL LOW (ref 6.5–8.1)

## 2017-03-07 LAB — URINALYSIS, MICROSCOPIC (REFLEX): RBC / HPF: NONE SEEN RBC/hpf (ref 0–5)

## 2017-03-07 LAB — PREGNANCY, URINE: PREG TEST UR: NEGATIVE

## 2017-03-07 MED ORDER — POTASSIUM CHLORIDE CRYS ER 20 MEQ PO TBCR
40.0000 meq | EXTENDED_RELEASE_TABLET | Freq: Once | ORAL | Status: AC
  Administered 2017-03-07: 40 meq via ORAL
  Filled 2017-03-07: qty 2

## 2017-03-07 MED ORDER — ONDANSETRON HCL 4 MG/2ML IJ SOLN
4.0000 mg | Freq: Once | INTRAMUSCULAR | Status: AC
Start: 2017-03-07 — End: 2017-03-07
  Administered 2017-03-07: 4 mg via INTRAVENOUS
  Filled 2017-03-07: qty 2

## 2017-03-07 MED ORDER — CYCLOBENZAPRINE HCL 10 MG PO TABS: 10.0000 mg | ORAL_TABLET | Freq: Two times a day (BID) | ORAL | 0 refills | Status: DC | PRN

## 2017-03-07 MED ORDER — MORPHINE SULFATE (PF) 4 MG/ML IV SOLN
4.0000 mg | Freq: Once | INTRAVENOUS | Status: AC
Start: 2017-03-07 — End: 2017-03-07
  Administered 2017-03-07: 4 mg via INTRAVENOUS
  Filled 2017-03-07: qty 1

## 2017-03-07 MED ORDER — ONDANSETRON HCL 4 MG PO TABS: 4.0000 mg | ORAL_TABLET | Freq: Four times a day (QID) | ORAL | 0 refills | Status: DC

## 2017-03-07 MED ORDER — SODIUM CHLORIDE 0.9 % IV BOLUS (SEPSIS)
1000.0000 mL | Freq: Once | INTRAVENOUS | Status: AC
  Administered 2017-03-07: 1000 mL via INTRAVENOUS

## 2017-03-07 MED ORDER — CEPHALEXIN 500 MG PO CAPS: 500.0000 mg | ORAL_CAPSULE | Freq: Two times a day (BID) | ORAL | 0 refills | Status: DC

## 2017-03-07 MED ORDER — POTASSIUM CHLORIDE CRYS ER 20 MEQ PO TBCR: 20.0000 meq | EXTENDED_RELEASE_TABLET | Freq: Two times a day (BID) | ORAL | 0 refills | Status: DC

## 2017-03-07 MED ORDER — MORPHINE SULFATE (PF) 4 MG/ML IV SOLN
4.0000 mg | Freq: Once | INTRAVENOUS | Status: AC
  Administered 2017-03-07: 4 mg via INTRAVENOUS
  Filled 2017-03-07: qty 1

## 2017-03-07 NOTE — ED Triage Notes (Addendum)
Pt presents to ED with complaints of dizziness, back  Pain and vomiting for 2 days. Pt had gastric sleeve placed 01/18/2017. PT states she can keep liquids down but not food.

## 2017-03-07 NOTE — Discharge Instructions (Signed)
Your hemoglobin is up from her last hemoglobin and normal. Your potassium is slightly decreased. Have given the oral potassium supplements. Have given use Zofran for nausea. Make sure he drink plenty of fluids and stay hydrated. Have given use of Flexeril for muscle laxation to control her back pain. Have started you on antibiotic called Keflex, for any urinary tract infection. Tylenol for pain. Heating pad to the affected area. Please follow-up with her primary care doctor this week for recheck and potassium and follow-up with today's visit. I would also follow up with your gastrointestinal doctor concerning your poor appetite. Return to the ED if he develop any worsening symptoms including headache, vision changes, abdominal pain, urinary symptoms, fevers, uncontrolled vomiting, chest pain or shortness of breath or for any other reason.

## 2017-03-07 NOTE — ED Provider Notes (Signed)
[facility] Provider Note   CSN: [id number] Arrival date & time: 03/07/17  1557  By signing my name below, I, Dolores Hoose, attest that this documentation has been prepared under the direction and in the presence of non-physician practitioner, Doristine Devoid, PA-C. Electronically Signed: Dolores Hoose, Scribe. 03/07/2017. 4:08 PM.  History   Chief Complaint Chief Complaint  Patient presents with  . Dizziness  . Emesis   The history is provided by the patient. No language interpreter was used.     HPI Comments: Brenda Spencer is a 26 y.o. female with pmhx of gastric sleeve placement 01/2017 who presents to the Emergency Department complaining of intermittent dizziness onset 2 days. Pt describes her dizziness as a sensation of near-syncope. Pt reports associated vomiting, nausea, and thoracic back pain that radiates down to her lumbar spine. Patient states she has a history of lower back pain and this feels similar. Her pain is worsened with palpation and her vomiting is worse after eating. P/o intake limited to fluids. Denies any dysuria, hematuria, vaginal bleeding, vaginal discharge, abdominal pain, visual changes, fever, diarrhea, constipation or bloody stool. Denies any chest pain or shortness of breath. Last bowel movement on 03/06/17. Pt has a recent shx of gastric sleeve placement on 01/18/17. Saw her gastroenterologist on 3/7 with normal exam. Patient states that she was not eating at that time however they educated her on diet. She states that although her vomiting has been particularly severe the past 2 days, she has been vomiting regularly since her surgery. Denies any hematemesis. Pt notes that she was seen by her surgeon for this and told that it was simply her body adjusting to the sleeve. Patient also states she has a history of anemia and required admission last year for lightheadedness and dizziness. Hemoglobin time was 8. Did not require a transfusion however she  states that the way she feels now feels like she is anemic again. Denies any recent trauma. Patient denies any loss of bowel or bladder, urinary retention, saddle paresthesias, history of cancer, history of IV drug use, night sweats.  Past Medical History:  Diagnosis Date  . Acute post-traumatic headache, not intractable 01/19/2016  . Asthma    ALbuterol inhaler as needed  . CHF (congestive heart failure) ([place]) 2011   RESOLVED; HAD CARDIAC EVAL   . Chronic back pain    reason unknown  . Congestive heart failure ([place]) 2011   related to preeclampsia  . Nocturia     Patient Active Problem List   Diagnosis Date Noted  . Low back pain 09/30/2016  . Obesity, Class II, BMI 35-39.9 07/20/2016  . Chiari I malformation ([place]) 06/09/2016  . Arnold-Chiari malformation, type I ([place]) 01/20/2016  . Exposure to second hand tobacco smoke 01/19/2016  . Wheezing 01/19/2016  . Premature rupture of membranes 09/09/2015  . Chromosome 1 deletion syndrome   . Parent of child with chromosome abnormality   . History of pregnancy induced hypertension 02/28/2015  . History of congestive heart failure 01/09/2013  . Mild persistent asthma 11/18/2012    Past Surgical History:  Procedure Laterality Date  . CHOLECYSTECTOMY     teenager  . SUBOCCIPITAL CRANIECTOMY CERVICAL LAMINECTOMY N/A 06/09/2016   Procedure: Chiari Decompression;  Surgeon: Consuella Lose, MD;  Location: [facility];  Service: Neurosurgery;  Laterality: N/A;  posterior/occipital  . TEAR DUCT PROBING  as a child  . TONSILLECTOMY     as a teenager  . TUBAL LIGATION Bilateral 09/10/2015  Procedure: POST PARTUM TUBAL LIGATION;  Surgeon: Mora Bellman, MD;  Location: [facility];  Service: Gynecology;  Laterality: Bilateral;  . WISDOM TOOTH EXTRACTION     as a teenager    OB History    Gravida Para Term Preterm AB Living   _0 SAB TAB Ectopic Multiple Live Births   1     0 4      Obstetric Comments   2011 PIH;IOL; PP  CHF-HOSPITALIZED X 1 WEEK       Home Medications    Prior to Admission medications   Medication Sig Start Date End Date Taking? Authorizing Provider  albuterol (PROVENTIL) (2.5 MG/3ML) 0.083% nebulizer solution Take 3 mLs (2.5 mg total) by nebulization every 6 (six) hours as needed for wheezing or shortness of breath. 12/24/16   Eustaquio Maize, MD  methocarbamol (ROBAXIN) 500 MG tablet TAKE 1 TABLET BY ORAL ROUTE EVERY 8 HOURS AS NEEDED FOR MUSCLE SPASMS 07/01/16   Historical Provider, MD  mupirocin ointment (BACTROBAN) 2 % Use twice a day on affected areas 09/30/16   Eustaquio Maize, MD    Family History Family History  Problem Relation Age of Onset  . Learning disabilities Other   . Colon cancer Maternal Grandmother   . Cancer Maternal Grandmother     colon  . Asthma Brother   . Learning disabilities Brother   . Asthma Daughter   . Learning disabilities Daughter   . Seizures Daughter   . Chromosomal disorder Daughter     1q21.1 microdeletion  . Cataracts Daughter     congenital  . Anesthesia problems Neg Hx   . Other Neg Hx     Social History Social History  Substance Use Topics  . Smoking status: Never Smoker  . Smokeless tobacco: Never Used  . Alcohol use No     Allergies   Patient has no known allergies.   Review of Systems Review of Systems  Constitutional: Negative for fever.  Eyes: Negative for visual disturbance.  Gastrointestinal: Positive for nausea and vomiting. Negative for abdominal pain, blood in stool, constipation and diarrhea.  Genitourinary: Negative for dysuria, hematuria, vaginal bleeding and vaginal discharge.  Musculoskeletal: Positive for back pain.  Neurological: Positive for dizziness.  All other systems reviewed and are negative.    Physical Exam Updated Vital Signs BP 132/85 (BP Location: Right Arm)   Pulse 100   Temp 97.3 F (36.3 C) (Oral)   Resp 20   SpO2 100%   Physical Exam  Constitutional: She is oriented to person,  place, and time. She appears well-developed and well-nourished. No distress.  HENT:  Head: Normocephalic and atraumatic.  Mouth/Throat: Uvula is midline and oropharynx is clear and moist. Mucous membranes are dry.  Eyes: Conjunctivae are normal. Pupils are equal, round, and reactive to light.  Neck: Normal range of motion. Neck supple.  No midline tenderness.  Cardiovascular: Normal rate, regular rhythm, normal heart sounds and intact distal pulses.  Exam reveals no gallop and no friction rub.   No murmur heard. Pulmonary/Chest: Effort normal and breath sounds normal. No respiratory distress. She has no wheezes. She has no rales. She exhibits no tenderness.  Abdominal: Soft. Bowel sounds are normal. She exhibits no distension. There is no tenderness. There is no rigidity, no rebound, no guarding and no CVA tenderness.  Musculoskeletal: Normal range of motion. She exhibits tenderness.  Midline lower T and upper L spine tenderness. No deformity or step-offs noted. Full  ROM.   Lymphadenopathy:    She has no cervical adenopathy.  Neurological: She is alert and oriented to person, place, and time.  The patient is alert, attentive, and oriented x 3. Speech is clear. Cranial nerve II-VII grossly intact. Negative pronator drift. Sensation intact. Strength 5/5 in all extremities. Reflexes 2+ and symmetric at biceps, triceps, knees, and ankles. Rapid alternating movement and fine finger movements intact. Romberg is absent. Posture and gait normal.   Skin: Skin is warm and dry. Capillary refill takes less than 2 seconds.  Psychiatric: She has a normal mood and affect.  Nursing note and vitals reviewed.    ED Treatments / Results  DIAGNOSTIC STUDIES:  Oxygen Saturation is 100% on RA, normal by my interpretation.    COORDINATION OF CARE:  4:20 PM Discussed treatment plan with pt at bedside which includes anti-nausea medication and pt agreed to plan.  Labs (all labs ordered are listed, but only  abnormal results are displayed) Labs Reviewed  COMPREHENSIVE METABOLIC PANEL - Abnormal; Notable for the following:       Result Value   Potassium 3.1 (*)    Calcium 8.6 (*)    Total Protein 6.3 (*)    All other components within normal limits  CBC WITH DIFFERENTIAL/PLATELET - Abnormal; Notable for the following:    Hemoglobin 11.5 (*)    RDW 15.6 (*)    All other components within normal limits  URINALYSIS, ROUTINE W REFLEX MICROSCOPIC - Abnormal; Notable for the following:    Color, Urine Nafeesa (*)    APPearance CLOUDY (*)    Bilirubin Urine SMALL (*)    Leukocytes, UA LARGE (*)    All other components within normal limits  URINALYSIS, MICROSCOPIC (REFLEX) - Abnormal; Notable for the following:    Bacteria, UA MANY (*)    Squamous Epithelial / LPF 6-30 (*)    All other components within normal limits  PREGNANCY, URINE    EKG  EKG Interpretation None       Radiology No results found.  Procedures Procedures (including critical care time)  Medications Ordered in ED Medications  ondansetron (ZOFRAN) injection 4 mg (4 mg Intravenous Given 03/07/17 1630)  sodium chloride 0.9 % bolus 1,000 mL (0 mLs Intravenous Stopped 03/07/17 1900)  morphine 4 MG/ML injection 4 mg (4 mg Intravenous Given 03/07/17 1633)  morphine 4 MG/ML injection 4 mg (4 mg Intravenous Given 03/07/17 1803)  potassium chloride SA (K-DUR,KLOR-CON) CR tablet 40 mEq (40 mEq Oral Given 03/07/17 1914)     Initial Impression / Assessment and Plan / ED Course  I have reviewed the triage vital signs and the nursing notes.  Pertinent labs & imaging results that were available during my care of the patient were reviewed by me and considered in my medical decision making (see chart for details).     Patient presents to the ED with complaints of intermittent dizziness/lightheadedness, back pain, emesis, nausea. Patient was concerned that she may be anemic again given her lightheadedness and dizziness. Hemoglobin is  11.5 which is baseline for patient and help from her last hemoglobin. Orthostatics were normal. Patient does appear mildly dehydrated. Have given her Zofran and fluid bolus. Able tolerate by mouth fluids without any difficulties. Patient felt improved after fluid bolus. Normal neurological exam with no focal neuro deficits concerning for intracranial abnormalities. Patient denies any abdominal pain, blood in stool. Do not feel that imaging is indicated at this time. Urine with large leukocytes, WBCs, many bacteria, squamous epithelium. Patient  denies any urinary  symptoms but given patient's back pain and CVA tenderness will treat with antibiotics. She is afebrile and not tachycardic. Denies any trauma or red flag symptoms concerning for cauda equina. We'll also give muscle relaxers in case pain is due to musculoskeletal. No leukocytosis noted. Patient does have a mild hypokalemia of 3.1. Do expect this given patient's recent gastric sleeve procedure. We'll place on oral potassium supplement and encouraged to follow up with primary care doctor for recheck and gastroenterologist for nutritional information. Patient's pain was treated in the ED. She felt much improved on discharge. Able tolerate by mouth fluids. Vital signs are stable and she is nontoxic appearing in no acute distress. Patient feels that she is stable for discharge and can be followed up with her primary care doctor this week. Patient was given strict return precautions. Hemodynamically stable with normal gait on discharge.   Final Clinical Impressions(s) / ED Diagnoses   Final diagnoses:  Dizziness  Nausea  Acute midline low back pain without sciatica  Hypokalemia    New Prescriptions Discharge Medication List as of 03/07/2017  6:59 PM    START taking these medications   Details  cephALEXin (KEFLEX) 500 MG capsule Take 1 capsule (500 mg total) by mouth 2 (two) times daily., Starting Sun 03/07/2017, Print    cyclobenzaprine (FLEXERIL)  10 MG tablet Take 1 tablet (10 mg total) by mouth 2 (two) times daily as needed for muscle spasms., Starting Sun 03/07/2017, Print    ondansetron (ZOFRAN) 4 MG tablet Take 1 tablet (4 mg total) by mouth every 6 (six) hours., Starting Sun 03/07/2017, Print    potassium chloride SA (K-DUR,KLOR-CON) 20 MEQ tablet Take 1 tablet (20 mEq total) by mouth 2 (two) times daily., Starting Sun 03/07/2017, Print      I personally performed the services described in this documentation, which was scribed in my presence. The recorded information has been reviewed and is accurate.     Doristine Devoid, PA-C 03/10/17 [place], MD 03/11/17 [phone]

## 2017-03-10 ENCOUNTER — Ambulatory Visit (INDEPENDENT_AMBULATORY_CARE_PROVIDER_SITE_OTHER): Payer: Medicaid Other | Admitting: Pediatrics

## 2017-03-10 ENCOUNTER — Encounter: Payer: Self-pay | Admitting: Pediatrics

## 2017-03-10 ENCOUNTER — Ambulatory Visit: Payer: Medicaid Other | Admitting: Pediatrics

## 2017-03-10 VITALS — BP 124/82 | HR 81 | Temp 97.9°F | Ht 60.0 in | Wt 174.6 lb

## 2017-03-10 DIAGNOSIS — Z113 Encounter for screening for infections with a predominantly sexual mode of transmission: Secondary | ICD-10-CM | POA: Diagnosis not present

## 2017-03-10 DIAGNOSIS — R300 Dysuria: Secondary | ICD-10-CM

## 2017-03-10 DIAGNOSIS — E876 Hypokalemia: Secondary | ICD-10-CM

## 2017-03-10 DIAGNOSIS — Z124 Encounter for screening for malignant neoplasm of cervix: Secondary | ICD-10-CM | POA: Diagnosis not present

## 2017-03-10 DIAGNOSIS — R109 Unspecified abdominal pain: Secondary | ICD-10-CM

## 2017-03-10 DIAGNOSIS — N739 Female pelvic inflammatory disease, unspecified: Secondary | ICD-10-CM

## 2017-03-10 LAB — URINALYSIS, COMPLETE
Bilirubin, UA: NEGATIVE
GLUCOSE, UA: NEGATIVE
Nitrite, UA: NEGATIVE
SPEC GRAV UA: 1.02 (ref 1.005–1.030)
Urobilinogen, Ur: >8 mg/dL — ABNORMAL HIGH (ref 0.2–1.0)
pH, UA: 7 (ref 5.0–7.5)

## 2017-03-10 LAB — WET PREP FOR TRICH, YEAST, CLUE
Clue Cell Exam: NEGATIVE
Trichomonas Exam: NEGATIVE
YEAST EXAM: NEGATIVE

## 2017-03-10 LAB — MICROSCOPIC EXAMINATION: Renal Epithel, UA: NONE SEEN /hpf

## 2017-03-10 MED ORDER — DOXYCYCLINE HYCLATE 100 MG PO TABS: 100.0000 mg | ORAL_TABLET | Freq: Two times a day (BID) | ORAL | 0 refills | Status: AC

## 2017-03-10 MED ORDER — CEFTRIAXONE SODIUM 250 MG IJ SOLR
250.0000 mg | Freq: Once | INTRAMUSCULAR | Status: AC
  Administered 2017-03-10: 250 mg via INTRAMUSCULAR

## 2017-03-10 NOTE — Progress Notes (Signed)
  Subjective:   Patient ID: Brenda Spencer, female    DOB: 1991/04/14, 26 y.o.   MRN: [id number] CC: nausea, bleeding HPI: Brenda Spencer is a 26 y.o. female presenting for nausea and vaginal bleeding  Hard time eating food down since having gastric sleeve, even if just a few bites, will vomit right afterward Feeling hungry Crackers, bread anything comes right back up Mashed foods come back up too Fluids stay down Has f/u with surgery upcoming Not stooling regularly since surgery but has not been eating much solid food No fevers  Bleeding after intercourse for past 2 weeks Recent new sexual partner about a month ago, husband doesn't know h/o trich, chlamydia several years ago LMP 3/15, lasted for 2-3 days  Has been spotting for past week daily when wiping, also noticing bleeding after intercourse twice as above   Relevant past medical, surgical, family and social history reviewed. Allergies and medications reviewed and updated. History  Smoking Status  . Never Smoker  Smokeless Tobacco  . Never Used   ROS: Per HPI   Objective:    BP 124/82   Pulse 81   Temp 97.9 F (36.6 C) (Oral)   Ht 5' (1.524 m)   Wt 174 lb 9.6 oz (79.2 kg)   LMP 02/28/2017 (Exact Date)   BMI 34.10 kg/m   Wt Readings from Last 3 Encounters:  03/10/17 174 lb 9.6 oz (79.2 kg)  03/07/17 177 lb (80.3 kg)  12/24/16 209 lb (94.8 kg)    Gen: NAD, alert, cooperative with exam, NCAT EYES: EOMI, no conjunctival injection, or no icterus ENT: OP without erythema LYMPH: no cervical LAD CV: NRRR, normal S1/S2, no murmur, distal pulses 2+ b/l Resp: CTABL, no wheezes, normal WOB Abd: +BS, soft, mildly tender lower abd. No guarding Ext: No edema, warm Neuro: Alert and oriented, strength equal b/l UE and LE, coordination grossly normal MSK: normal muscle bulk GU: thick green-yellow mucus from cervical os, +CTM, + R adenexal tenderness  Assessment & Plan:  Diagnoses and all orders for this  visit:  Pelvic inflammatory disease New sexual partner, treat as below given symptoms and exam -     doxycycline (VIBRA-TABS) 100 MG tablet; Take 1 tablet (100 mg total) by mouth 2 (two) times daily. -     cefTRIAXone (ROCEPHIN) injection 250 mg; Inject 250 mg into the muscle once.  Abdominal pain, unspecified abdominal location Treating for PID, has f/u with surgery for decreased food tolerance post gastric sleeve  Screening for cervical cancer -     Pap IG, CT/NG w/ reflex HPV when ASC-U  Routine screening for STI (sexually transmitted infection) -     Pap IG, CT/NG w/ reflex HPV when ASC-U -     HIV antibody -     RPR -     WET PREP FOR TRICH, YEAST, CLUE -     GC/Chlamydia Probe Amp  Hypokalemia Repeat K, recently in ED, started on K supplement Will decrease K supplement if able causing some abd discomfort -     Basic Metabolic Panel  Dysuria -     Urinalysis, Complete -     Urine culture  Other orders -     Microscopic Examination   Follow up plan: Return in about 4 weeks (around 04/07/2017). [name], MD [place]

## 2017-03-11 LAB — BASIC METABOLIC PANEL
BUN / CREAT RATIO: 10 (ref 9–23)
BUN: 6 mg/dL (ref 6–20)
CHLORIDE: 102 mmol/L (ref 96–106)
CO2: 24 mmol/L (ref 18–29)
CREATININE: 0.59 mg/dL (ref 0.57–1.00)
Calcium: 9 mg/dL (ref 8.7–10.2)
GFR calc non Af Amer: 126 mL/min/{1.73_m2} (ref >59)
GFR, EST AFRICAN AMERICAN: 145 mL/min/{1.73_m2} (ref >59)
Glucose: 85 mg/dL (ref 65–99)
Potassium: 3.7 mmol/L (ref 3.5–5.2)
Sodium: 143 mmol/L (ref 134–144)

## 2017-03-11 LAB — RPR: RPR: NONREACTIVE

## 2017-03-11 LAB — HIV ANTIBODY (ROUTINE TESTING W REFLEX): HIV Screen 4th Generation wRfx: NONREACTIVE

## 2017-03-12 LAB — PAP IG, CT-NG, RFX HPV ASCU
Chlamydia, Nuc. Acid Amp: NEGATIVE
Gonococcus by Nucleic Acid Amp: NEGATIVE
PAP SMEAR COMMENT: 0

## 2017-03-12 LAB — GC/CHLAMYDIA PROBE AMP
Chlamydia trachomatis, NAA: NEGATIVE
Neisseria gonorrhoeae by PCR: NEGATIVE

## 2017-03-14 LAB — URINE CULTURE

## 2017-04-22 ENCOUNTER — Emergency Department (HOSPITAL_BASED_OUTPATIENT_CLINIC_OR_DEPARTMENT_OTHER)
Admission: EM | Admit: 2017-04-22 | Discharge: 2017-04-22 | Disposition: A | Discharge status: Home / Self Care | Payer: Medicaid Other | Attending: Emergency Medicine | Admitting: Emergency Medicine

## 2017-04-22 ENCOUNTER — Emergency Department (HOSPITAL_BASED_OUTPATIENT_CLINIC_OR_DEPARTMENT_OTHER): Payer: Medicaid Other

## 2017-04-22 ENCOUNTER — Encounter (HOSPITAL_BASED_OUTPATIENT_CLINIC_OR_DEPARTMENT_OTHER): Payer: Self-pay | Admitting: *Deleted

## 2017-04-22 DIAGNOSIS — N390 Urinary tract infection, site not specified: Secondary | ICD-10-CM | POA: Diagnosis not present

## 2017-04-22 DIAGNOSIS — E876 Hypokalemia: Secondary | ICD-10-CM | POA: Diagnosis not present

## 2017-04-22 DIAGNOSIS — R1012 Left upper quadrant pain: Secondary | ICD-10-CM | POA: Diagnosis present

## 2017-04-22 DIAGNOSIS — J45909 Unspecified asthma, uncomplicated: Secondary | ICD-10-CM | POA: Diagnosis not present

## 2017-04-22 DIAGNOSIS — Z79899 Other long term (current) drug therapy: Secondary | ICD-10-CM | POA: Diagnosis not present

## 2017-04-22 DIAGNOSIS — E860 Dehydration: Secondary | ICD-10-CM

## 2017-04-22 DIAGNOSIS — R1013 Epigastric pain: Secondary | ICD-10-CM

## 2017-04-22 LAB — CBC WITH DIFFERENTIAL/PLATELET
BASOS PCT: 0 %
Basophils Absolute: 0 10*3/uL (ref 0.0–0.1)
EOS PCT: 1 %
Eosinophils Absolute: 0.1 10*3/uL (ref 0.0–0.7)
HEMATOCRIT: 38.1 % (ref 36.0–46.0)
Hemoglobin: 12.8 g/dL (ref 12.0–15.0)
Lymphocytes Relative: 24 %
Lymphs Abs: 1.7 10*3/uL (ref 0.7–4.0)
MCH: 27.8 pg (ref 26.0–34.0)
MCHC: 33.6 g/dL (ref 30.0–36.0)
MCV: 82.6 fL (ref 78.0–100.0)
MONO ABS: 0.8 10*3/uL (ref 0.1–1.0)
MONOS PCT: 11 %
NEUTROS ABS: 4.5 10*3/uL (ref 1.7–7.7)
Neutrophils Relative %: 64 %
Platelets: 251 10*3/uL (ref 150–400)
RBC: 4.61 MIL/uL (ref 3.87–5.11)
RDW: 16.1 % — AB (ref 11.5–15.5)
WBC: 7.1 10*3/uL (ref 4.0–10.5)

## 2017-04-22 LAB — URINALYSIS, ROUTINE W REFLEX MICROSCOPIC
GLUCOSE, UA: NEGATIVE mg/dL
HGB URINE DIPSTICK: NEGATIVE
KETONES UR: 15 mg/dL — AB
Nitrite: NEGATIVE
Protein, ur: 30 mg/dL — AB
Specific Gravity, Urine: 1.028 (ref 1.005–1.030)
pH: 6 (ref 5.0–8.0)

## 2017-04-22 LAB — PREGNANCY, URINE: Preg Test, Ur: NEGATIVE

## 2017-04-22 LAB — I-STAT CG4 LACTIC ACID, ED: Lactic Acid, Venous: 0.73 mmol/L (ref 0.5–1.9)

## 2017-04-22 LAB — COMPREHENSIVE METABOLIC PANEL
ALBUMIN: 3.4 g/dL — AB (ref 3.5–5.0)
ALT: 19 U/L (ref 14–54)
ANION GAP: 8 (ref 5–15)
AST: 20 U/L (ref 15–41)
Alkaline Phosphatase: 76 U/L (ref 38–126)
BILIRUBIN TOTAL: 0.5 mg/dL (ref 0.3–1.2)
BUN: 6 mg/dL (ref 6–20)
CO2: 27 mmol/L (ref 22–32)
Calcium: 8.6 mg/dL — ABNORMAL LOW (ref 8.9–10.3)
Chloride: 104 mmol/L (ref 101–111)
Creatinine, Ser: 0.46 mg/dL (ref 0.44–1.00)
GFR calc Af Amer: >60 mL/min (ref >60)
GFR calc non Af Amer: >60 mL/min (ref >60)
GLUCOSE: 84 mg/dL (ref 65–99)
POTASSIUM: 2.8 mmol/L — AB (ref 3.5–5.1)
Sodium: 139 mmol/L (ref 135–145)
TOTAL PROTEIN: 6.5 g/dL (ref 6.5–8.1)

## 2017-04-22 LAB — URINALYSIS, MICROSCOPIC (REFLEX)

## 2017-04-22 LAB — LIPASE, BLOOD: LIPASE: 32 U/L (ref 11–51)

## 2017-04-22 MED ORDER — MORPHINE SULFATE (PF) 4 MG/ML IV SOLN
4.0000 mg | Freq: Once | INTRAVENOUS | Status: AC
  Administered 2017-04-22: 4 mg via INTRAVENOUS
  Filled 2017-04-22: qty 1

## 2017-04-22 MED ORDER — ONDANSETRON HCL 4 MG PO TABS: 4.0000 mg | ORAL_TABLET | Freq: Four times a day (QID) | ORAL | 0 refills | Status: DC

## 2017-04-22 MED ORDER — POTASSIUM CHLORIDE 10 MEQ/100ML IV SOLN
10.0000 meq | INTRAVENOUS | Status: AC
  Administered 2017-04-22: 10 meq via INTRAVENOUS
  Filled 2017-04-22 (×2): qty 100

## 2017-04-22 MED ORDER — CEPHALEXIN 500 MG PO CAPS: 500.0000 mg | ORAL_CAPSULE | Freq: Two times a day (BID) | ORAL | 0 refills | Status: DC

## 2017-04-22 MED ORDER — ONDANSETRON HCL 4 MG/2ML IJ SOLN
4.0000 mg | Freq: Once | INTRAMUSCULAR | Status: AC
  Administered 2017-04-22: 4 mg via INTRAVENOUS
  Filled 2017-04-22: qty 2

## 2017-04-22 MED ORDER — HYDROCODONE-ACETAMINOPHEN 5-325 MG PO TABS: 1.0000 | ORAL_TABLET | Freq: Four times a day (QID) | ORAL | 0 refills | Status: DC | PRN

## 2017-04-22 MED ORDER — SODIUM CHLORIDE 0.9 % IV BOLUS (SEPSIS)
1000.0000 mL | Freq: Once | INTRAVENOUS | Status: AC
  Administered 2017-04-22: 1000 mL via INTRAVENOUS

## 2017-04-22 MED ORDER — IOPAMIDOL (ISOVUE-300) INJECTION 61%
100.0000 mL | Freq: Once | INTRAVENOUS | Status: AC | PRN
  Administered 2017-04-22: 100 mL via INTRAVENOUS

## 2017-04-22 MED ORDER — OMEPRAZOLE 20 MG PO CPDR: 20.0000 mg | DELAYED_RELEASE_CAPSULE | Freq: Every day | ORAL | 1 refills | Status: DC

## 2017-04-22 MED ORDER — PANTOPRAZOLE SODIUM 40 MG IV SOLR
40.0000 mg | Freq: Once | INTRAVENOUS | Status: AC
  Administered 2017-04-22: 40 mg via INTRAVENOUS
  Filled 2017-04-22: qty 40

## 2017-04-22 MED ORDER — DEXTROSE 5 % IV SOLN
1.0000 g | Freq: Once | INTRAVENOUS | Status: AC
  Administered 2017-04-22: 1 g via INTRAVENOUS
  Filled 2017-04-22: qty 10

## 2017-04-22 NOTE — ED Notes (Signed)
Patient transported to CT 

## 2017-04-22 NOTE — ED Provider Notes (Signed)
[facility] Provider Note   CSN: [id number] Arrival date & time: 04/22/17  1552     History   Chief Complaint Chief Complaint  Patient presents with  . Abdominal Pain    HPI Brenda Spencer is a 26 y.o. female.  Patient is a 26 year old female with a significant past medical history for preeclampsia with congestive heart failure, asthma, obesity, recent gastric sleeve in February 2018 presenting today with persistent upper abdominal pain and vomiting. She describes the pain as cramping and colicky but is not resolved. She's also had persistent nausea and vomiting. She states since the surgery she has only been able to eat liquids due to nausea and vomiting she has not even been able to advance to pure's. She denies eating anything she is not supposed to. However this is the first time she's had pain associated with nausea and vomiting. She denies any vaginal or urinary complaints. LMP was less than 1 month ago.   The history is provided by the patient.  Abdominal Pain   This is a new problem. Episode onset: 4 days. The problem occurs constantly. The problem has been gradually worsening. The pain is associated with a previous surgery and eating. The pain is located in the LUQ and epigastric region. The quality of the pain is aching, cramping and colicky. The pain is at a severity of 8/10. The pain is severe. Associated symptoms include anorexia, flatus, nausea and vomiting. Pertinent negatives include fever, diarrhea, constipation and dysuria. Nothing relieves the symptoms. Past medical history comments: prior gastric sleeve 01/2017.    Past Medical History:  Diagnosis Date  . Acute post-traumatic headache, not intractable 01/19/2016  . Asthma    ALbuterol inhaler as needed  . CHF (congestive heart failure) ([place]) 2011   RESOLVED; HAD CARDIAC EVAL   . Chronic back pain    reason unknown  . Congestive heart failure ([place]) 2011   related to preeclampsia  . Nocturia      Patient Active Problem List   Diagnosis Date Noted  . Low back pain 09/30/2016  . Obesity, Class II, BMI 35-39.9 07/20/2016  . Chiari I malformation ([place]) 06/09/2016  . Arnold-Chiari malformation, type I ([place]) 01/20/2016  . Exposure to second hand tobacco smoke 01/19/2016  . Wheezing 01/19/2016  . Premature rupture of membranes 09/09/2015  . Chromosome 1 deletion syndrome   . Parent of child with chromosome abnormality   . History of pregnancy induced hypertension 02/28/2015  . History of congestive heart failure 01/09/2013  . Mild persistent asthma 11/18/2012    Past Surgical History:  Procedure Laterality Date  . CHOLECYSTECTOMY     teenager  . SLEEVE GASTROPLASTY  01/18/2017  . SUBOCCIPITAL CRANIECTOMY CERVICAL LAMINECTOMY N/A 06/09/2016   Procedure: Chiari Decompression;  Surgeon: Consuella Lose, MD;  Location: [facility];  Service: Neurosurgery;  Laterality: N/A;  posterior/occipital  . TEAR DUCT PROBING  as a child  . TONSILLECTOMY     as a teenager  . TUBAL LIGATION Bilateral 09/10/2015   Procedure: POST PARTUM TUBAL LIGATION;  Surgeon: Mora Bellman, MD;  Location: [facility];  Service: Gynecology;  Laterality: Bilateral;  . WISDOM TOOTH EXTRACTION     as a teenager    OB History    Gravida Para Term Preterm AB Living   '5 4 3 1 1 4   '$ SAB TAB Ectopic Multiple Live Births   1     0 4      Obstetric Comments   2011  PIH;IOL; PP CHF-HOSPITALIZED X 1 WEEK       Home Medications    Prior to Admission medications   Medication Sig Start Date End Date Taking? Authorizing Provider  albuterol (PROVENTIL) (2.5 MG/3ML) 0.083% nebulizer solution Take 3 mLs (2.5 mg total) by nebulization every 6 (six) hours as needed for wheezing or shortness of breath. 12/24/16   Eustaquio Maize, MD  cyclobenzaprine (FLEXERIL) 10 MG tablet Take 1 tablet (10 mg total) by mouth 2 (two) times daily as needed for muscle spasms. 03/07/17   Doristine Devoid, PA-C  methocarbamol (ROBAXIN)  500 MG tablet TAKE 1 TABLET BY ORAL ROUTE EVERY 8 HOURS AS NEEDED FOR MUSCLE SPASMS 07/01/16   [provider]  mupirocin ointment (BACTROBAN) 2 % Use twice a day on affected areas 09/30/16   Eustaquio Maize, MD  ondansetron (ZOFRAN) 4 MG tablet Take 1 tablet (4 mg total) by mouth every 6 (six) hours. 03/07/17   Doristine Devoid, PA-C  potassium chloride SA (K-DUR,KLOR-CON) 20 MEQ tablet Take 1 tablet (20 mEq total) by mouth 2 (two) times daily. 03/07/17   Doristine Devoid, PA-C    Family History Family History  Problem Relation Age of Onset  . Learning disabilities Other   . Colon cancer Maternal Grandmother   . Cancer Maternal Grandmother        colon  . Asthma Brother   . Learning disabilities Brother   . Asthma Daughter   . Learning disabilities Daughter   . Seizures Daughter   . Chromosomal disorder Daughter        1q21.1 microdeletion  . Cataracts Daughter        congenital  . Anesthesia problems Neg Hx   . Other Neg Hx     Social History Social History  Substance Use Topics  . Smoking status: Never Smoker  . Smokeless tobacco: Never Used  . Alcohol use No     Allergies   Patient has no known allergies.   Review of Systems Review of Systems  Constitutional: Negative for fever.  Gastrointestinal: Positive for abdominal pain, anorexia, flatus, nausea and vomiting. Negative for constipation and diarrhea.  Genitourinary: Negative for dysuria.  Musculoskeletal:       Pt also states she has had right sided back pain for the last month that has not seemed to improve  All other systems reviewed and are negative.    Physical Exam Updated Vital Signs BP 125/87 (BP Location: Left Arm)   Pulse (!) 104   Temp 98.6 F (37 C) (Oral)   Resp (!) 23   Ht '5\' 5"'$  (1.651 m)   Wt 155 lb (70.3 kg)   LMP 03/30/2017   SpO2 98%   BMI 25.79 kg/m   Physical Exam  Constitutional: She is oriented to person, place, and time. She appears well-developed and  well-nourished. No distress.  HENT:  Head: Normocephalic and atraumatic.  Mouth/Throat: Oropharynx is clear and moist. Mucous membranes are dry.  Eyes: Conjunctivae and EOM are normal. Pupils are equal, round, and reactive to light.  Neck: Normal range of motion. Neck supple.  Cardiovascular: Normal rate, regular rhythm and intact distal pulses.   No murmur heard. Pulmonary/Chest: Effort normal and breath sounds normal. No respiratory distress. She has no wheezes. She has no rales.  Abdominal: Soft. Bowel sounds are normal. She exhibits no distension. There is tenderness in the epigastric area and left upper quadrant. There is no rebound and no guarding.  Well healed abd incisions  Musculoskeletal: Normal range of motion. She exhibits no edema or tenderness.  Neurological: She is alert and oriented to person, place, and time.  Skin: Skin is warm and dry. No rash noted. No erythema.  Psychiatric: She has a normal mood and affect. Her behavior is normal.  Nursing note and vitals reviewed.    ED Treatments / Results  Labs (all labs ordered are listed, but only abnormal results are displayed) Labs Reviewed  URINALYSIS, ROUTINE W REFLEX MICROSCOPIC - Abnormal; Notable for the following:       Result Value   Color, Urine ORANGE (*)    APPearance CLOUDY (*)    Bilirubin Urine MODERATE (*)    Ketones, ur 15 (*)    Protein, ur 30 (*)    Leukocytes, UA LARGE (*)    All other components within normal limits  CBC WITH DIFFERENTIAL/PLATELET - Abnormal; Notable for the following:    RDW 16.1 (*)    All other components within normal limits  COMPREHENSIVE METABOLIC PANEL - Abnormal; Notable for the following:    Potassium 2.8 (*)    Calcium 8.6 (*)    Albumin 3.4 (*)    All other components within normal limits  URINALYSIS, MICROSCOPIC (REFLEX) - Abnormal; Notable for the following:    Bacteria, UA FEW (*)    Squamous Epithelial / LPF 0-5 (*)    All other components within normal limits    URINE CULTURE  PREGNANCY, URINE  LIPASE, BLOOD  I-STAT CG4 LACTIC ACID, ED    EKG  EKG Interpretation None       Radiology Ct Abdomen Pelvis W Contrast  Result Date: 04/22/2017 CLINICAL DATA:  Epigastric and left upper quadrant pain that started Sunday. Vomiting. Status post sleeve gastrectomy [facility] 01/18/2017. EXAM: CT ABDOMEN AND PELVIS WITH CONTRAST TECHNIQUE: Multidetector CT imaging of the abdomen and pelvis was performed using the standard protocol following bolus administration of intravenous contrast. CONTRAST:  ISOVUE-300 IOPAMIDOL (ISOVUE-300) INJECTION 61% COMPARISON:  None. FINDINGS: Lower chest:  Unremarkable. Hepatobiliary: 2.5 cm lesion in the inferior right liver shows central nodular enhancement. A smaller 9 mm lesion identified in the right liver on image 18 of series 2 has similar imaging features. Ill-defined area of low attenuation in the subcapsular left low along the falciform ligament is probably focal fatty deposition given anomalous venous anatomy often associated with this part of the liver parenchyma. Gallbladder surgically absent. Common bile duct measures 7 mm in the head of the pancreas, likely related to prior cholecystectomy. Pancreas: No focal mass lesion. No dilatation of the main duct. No intraparenchymal cyst. No peripancreatic edema. Spleen: No splenomegaly. No focal mass lesion. Adrenals/Urinary Tract: No adrenal nodule or mass. Kidneys are unremarkable. No evidence for hydroureter. The urinary bladder appears normal for the degree of distention. Stomach/Bowel: Changes in the stomach compatible with reported history of sleeve gastrectomy. There does appear to be some mild edema around the proximal stomach. Bulging from the suture line, way from the remaining stomach is any 5.6 x 3.9 x 4.5 cm rim enhancing homogeneous structure with attenuation higher than would be expected for a simple cyst. This is probably a hematoma given the density has seroma would  not be expected to be this high attenuation. Abscess is a consideration as well. Duodenum is normally positioned as is the ligament of Treitz. No small bowel wall thickening. No small bowel dilatation. The terminal ileum is normal. The appendix is normal. No gross colonic mass. No colonic wall thickening. No substantial  diverticular change. Vascular/Lymphatic: No abdominal aortic aneurysm. No abdominal aortic atherosclerotic calcification. There is no gastrohepatic or hepatoduodenal ligament lymphadenopathy. No intraperitoneal or retroperitoneal lymphadenopathy. No pelvic sidewall lymphadenopathy. Reproductive: The uterus has normal CT imaging appearance. There is no adnexal mass. Other: No intraperitoneal free fluid. Musculoskeletal: Bone windows reveal no worrisome lytic or sclerotic osseous lesions. IMPRESSION: 1. 6 x 4 x 5 cm rim enhancing, well-defined homogeneous lesion emanating from the suture line of the sleeve gastrectomy. Given the attenuation of this structure and the relative homogeneity of the internal contents, hematoma is favored. Contained suture line leak, seroma, or abscess are also considerations but considered less likely by imaging. 2. Two lesions in the liver measuring up to 2.5 cm. Both show central enhancement. Given patient age, these are most likely benign. Outpatient , nonemergent MRI of the abdomen without and with Eovist contrast material may prove helpful to further evaluate. Electronically Signed   By: Kennith Center M.D.   On: 04/22/2017 18:26    Procedures Procedures (including critical care time)  Medications Ordered in ED Medications  potassium chloride 10 mEq in 100 mL IVPB (not administered)  cefTRIAXone (ROCEPHIN) 1 g in dextrose 5 % 50 mL IVPB (not administered)  pantoprazole (PROTONIX) injection 40 mg (not administered)  sodium chloride 0.9 % bolus 1,000 mL (not administered)  ondansetron (ZOFRAN) injection 4 mg (4 mg Intravenous Given 04/22/17 1646)  morphine 4  MG/ML injection 4 mg (4 mg Intravenous Given 04/22/17 1649)  sodium chloride 0.9 % bolus 1,000 mL (1,000 mLs Intravenous New Bag/Given 04/22/17 1644)  iopamidol (ISOVUE-300) 61 % injection 100 mL (100 mLs Intravenous Contrast Given 04/22/17 1740)  morphine 4 MG/ML injection 4 mg (4 mg Intravenous Given 04/22/17 1847)     Initial Impression / Assessment and Plan / ED Course  I have reviewed the triage vital signs and the nursing notes.  Pertinent labs & imaging results that were available during my care of the patient were reviewed by me and considered in my medical decision making (see chart for details).     Patient presenting today with upper abdominal pain, nausea and vomiting for the last 4 days status post gastric sleeve in February. Vital signs with mild tachycardia but otherwise within normal limits. Patient does not have peritoneal signs and denies any infectious symptoms. She is still having bowel movements and had a normal bowel movement today. Concern for possible complication of her sleeve. Patient given IV fluids, pain and nausea medication. CBC, CMP, lipase and CT pending. Patient denies any NSAID or alcohol use. No history of bloody vomitus.  6:15 PM UA with concern for UTI with [facility] WBC's and bacteria.  With back pain for a month may be pyelonephritis causing her sx.  Normal CBC and CMP with hypokalemia of 2.8 which is most likely from vomiting and anorexia.  Lactate and Cr wnl.  CT pending.  6:41 PM CT with 6x4x5cm rim enhancing lesion emanating from the suture line.  Will discuss with pt's surgeon for further recommendations.  K replaced IV.    7:33 PM Spoke with Dr. Adolphus Birchwood with [facility] and he felt CT results were most likely just old hematoma from surgery.  He recommended hydration, fluid replacement and they will see her in the office tomorrow.  When speaking with the pt she also admits to no longer taking PPI.  Will give protonix, fluid and potassium  here.  Pt also has signs of UTI but low suspicion for pyelo.  Will give  rocephin and po challenge.  Pain improved after fluids and meds.  She was also given ppx for her PPI   Final Clinical Impressions(s) / ED Diagnoses   Final diagnoses:  Epigastric pain  Dehydration  Hypokalemia  Urinary tract infection without hematuria, site unspecified    New Prescriptions New Prescriptions   CEPHALEXIN (KEFLEX) 500 MG CAPSULE    Take 1 capsule (500 mg total) by mouth 2 (two) times daily.   HYDROCODONE-ACETAMINOPHEN (NORCO/VICODIN) 5-325 MG TABLET    Take 1-2 tablets by mouth every 6 (six) hours as needed.   OMEPRAZOLE (PRILOSEC) 20 MG CAPSULE    Take 1 capsule (20 mg total) by mouth daily.   ONDANSETRON (ZOFRAN) 4 MG TABLET    Take 1 tablet (4 mg total) by mouth every 6 (six) hours.     Blanchie Dessert, MD 04/22/17 2112

## 2017-04-22 NOTE — ED Triage Notes (Signed)
Patient is alert and oriented x4. She is complaining of epigastric and LUQ Pain that started on Sunday.  Patient states that she has not been able to eat or drink without vomiting since Sunday.  Patient has a history of gastric sleeve surgery.  Currently she rates her pain 10 of 10 with nausea.

## 2017-04-24 LAB — URINE CULTURE

## 2017-05-04 ENCOUNTER — Telehealth: Payer: Self-pay | Admitting: Pediatrics

## 2017-05-04 ENCOUNTER — Emergency Department (HOSPITAL_BASED_OUTPATIENT_CLINIC_OR_DEPARTMENT_OTHER)
Admission: EM | Admit: 2017-05-04 | Discharge: 2017-05-05 | Disposition: A | Discharge status: Home / Self Care | Payer: Medicaid Other | Attending: Emergency Medicine | Admitting: Emergency Medicine

## 2017-05-04 ENCOUNTER — Encounter (HOSPITAL_BASED_OUTPATIENT_CLINIC_OR_DEPARTMENT_OTHER): Payer: Self-pay | Admitting: Emergency Medicine

## 2017-05-04 DIAGNOSIS — J45909 Unspecified asthma, uncomplicated: Secondary | ICD-10-CM | POA: Diagnosis not present

## 2017-05-04 DIAGNOSIS — E876 Hypokalemia: Secondary | ICD-10-CM | POA: Insufficient documentation

## 2017-05-04 DIAGNOSIS — R509 Fever, unspecified: Secondary | ICD-10-CM | POA: Diagnosis not present

## 2017-05-04 DIAGNOSIS — R160 Hepatomegaly, not elsewhere classified: Secondary | ICD-10-CM

## 2017-05-04 DIAGNOSIS — K7689 Other specified diseases of liver: Secondary | ICD-10-CM | POA: Diagnosis not present

## 2017-05-04 DIAGNOSIS — I509 Heart failure, unspecified: Secondary | ICD-10-CM | POA: Insufficient documentation

## 2017-05-04 DIAGNOSIS — E860 Dehydration: Secondary | ICD-10-CM | POA: Insufficient documentation

## 2017-05-04 DIAGNOSIS — R42 Dizziness and giddiness: Secondary | ICD-10-CM | POA: Diagnosis present

## 2017-05-04 HISTORY — DX: Patient's noncompliance with other medical treatment and regimen: Z91.19

## 2017-05-04 HISTORY — DX: Acquired absence of stomach (part of): K91.2

## 2017-05-04 HISTORY — DX: Acquired absence of stomach (part of): Z90.3

## 2017-05-04 HISTORY — DX: Patient's noncompliance with other medical treatment and regimen due to unspecified reason: Z91.199

## 2017-05-04 LAB — URINALYSIS, ROUTINE W REFLEX MICROSCOPIC
Glucose, UA: NEGATIVE mg/dL
Hgb urine dipstick: NEGATIVE
KETONES UR: 15 mg/dL — AB
Nitrite: NEGATIVE
PH: 6 (ref 5.0–8.0)
PROTEIN: 30 mg/dL — AB
Specific Gravity, Urine: 1.029 (ref 1.005–1.030)

## 2017-05-04 LAB — URINALYSIS, MICROSCOPIC (REFLEX): RBC / HPF: NONE SEEN RBC/hpf (ref 0–5)

## 2017-05-04 LAB — CBG MONITORING, ED: Glucose-Capillary: 83 mg/dL (ref 65–99)

## 2017-05-04 LAB — PREGNANCY, URINE: Preg Test, Ur: NEGATIVE

## 2017-05-04 NOTE — ED Triage Notes (Signed)
Pt c/o of feeling light headed. Pt is s/p gastric sleeve and states she has not ate x 1 week due to vomiting after eating. Pt c/o HA, abd pain and back pain.

## 2017-05-04 NOTE — Telephone Encounter (Signed)
followup appointment made with Dr. Evette Doffing on 05/05/17 to discuss

## 2017-05-05 ENCOUNTER — Encounter: Payer: Self-pay | Admitting: Pediatrics

## 2017-05-05 ENCOUNTER — Emergency Department (HOSPITAL_BASED_OUTPATIENT_CLINIC_OR_DEPARTMENT_OTHER): Payer: Medicaid Other

## 2017-05-05 ENCOUNTER — Ambulatory Visit (INDEPENDENT_AMBULATORY_CARE_PROVIDER_SITE_OTHER): Payer: Medicaid Other | Admitting: Pediatrics

## 2017-05-05 VITALS — BP 111/81 | HR 91 | Temp 97.8°F | Ht 65.0 in | Wt 152.4 lb

## 2017-05-05 DIAGNOSIS — E46 Unspecified protein-calorie malnutrition: Secondary | ICD-10-CM | POA: Diagnosis not present

## 2017-05-05 DIAGNOSIS — K769 Liver disease, unspecified: Secondary | ICD-10-CM | POA: Diagnosis not present

## 2017-05-05 DIAGNOSIS — R112 Nausea with vomiting, unspecified: Secondary | ICD-10-CM | POA: Diagnosis not present

## 2017-05-05 DIAGNOSIS — R1084 Generalized abdominal pain: Secondary | ICD-10-CM

## 2017-05-05 LAB — COMPREHENSIVE METABOLIC PANEL
ALK PHOS: 62 U/L (ref 38–126)
ALT: 18 U/L (ref 14–54)
AST: 22 U/L (ref 15–41)
Albumin: 3.1 g/dL — ABNORMAL LOW (ref 3.5–5.0)
Anion gap: 7 (ref 5–15)
BUN: 6 mg/dL (ref 6–20)
CHLORIDE: 106 mmol/L (ref 101–111)
CO2: 25 mmol/L (ref 22–32)
CREATININE: 0.5 mg/dL (ref 0.44–1.00)
Calcium: 7.9 mg/dL — ABNORMAL LOW (ref 8.9–10.3)
GFR calc Af Amer: >60 mL/min (ref >60)
Glucose, Bld: 82 mg/dL (ref 65–99)
Potassium: 2.9 mmol/L — ABNORMAL LOW (ref 3.5–5.1)
SODIUM: 138 mmol/L (ref 135–145)
Total Bilirubin: 0.6 mg/dL (ref 0.3–1.2)
Total Protein: 5.7 g/dL — ABNORMAL LOW (ref 6.5–8.1)

## 2017-05-05 LAB — CBC WITH DIFFERENTIAL/PLATELET
Basophils Absolute: 0 10*3/uL (ref 0.0–0.1)
Basophils Relative: 0 %
EOS PCT: 2 %
Eosinophils Absolute: 0.1 10*3/uL (ref 0.0–0.7)
HCT: 34.7 % — ABNORMAL LOW (ref 36.0–46.0)
HEMOGLOBIN: 11.7 g/dL — AB (ref 12.0–15.0)
Lymphocytes Relative: 40 %
Lymphs Abs: 2.3 10*3/uL (ref 0.7–4.0)
MCH: 27.9 pg (ref 26.0–34.0)
MCHC: 33.7 g/dL (ref 30.0–36.0)
MCV: 82.8 fL (ref 78.0–100.0)
Monocytes Absolute: 0.6 10*3/uL (ref 0.1–1.0)
Monocytes Relative: 10 %
Neutro Abs: 2.8 10*3/uL (ref 1.7–7.7)
Neutrophils Relative %: 48 %
PLATELETS: 245 10*3/uL (ref 150–400)
RBC: 4.19 MIL/uL (ref 3.87–5.11)
RDW: 16.3 % — ABNORMAL HIGH (ref 11.5–15.5)
WBC: 5.8 10*3/uL (ref 4.0–10.5)

## 2017-05-05 LAB — LIPASE, BLOOD: Lipase: 31 U/L (ref 11–51)

## 2017-05-05 MED ORDER — FENTANYL CITRATE (PF) 100 MCG/2ML IJ SOLN
50.0000 ug | Freq: Once | INTRAMUSCULAR | Status: AC
  Administered 2017-05-05: 50 ug via INTRAVENOUS
  Filled 2017-05-05: qty 2

## 2017-05-05 MED ORDER — ONDANSETRON 4 MG PO TBDP: 4.0000 mg | ORAL_TABLET | Freq: Three times a day (TID) | ORAL | 0 refills | Status: DC | PRN

## 2017-05-05 MED ORDER — OXYCODONE-ACETAMINOPHEN 5-325 MG PO TABS
1.0000 | ORAL_TABLET | Freq: Once | ORAL | Status: DC
  Filled 2017-05-05: qty 1

## 2017-05-05 MED ORDER — POTASSIUM CHLORIDE CRYS ER 20 MEQ PO TBCR: 40.0000 meq | EXTENDED_RELEASE_TABLET | Freq: Every day | ORAL | 0 refills | Status: DC

## 2017-05-05 MED ORDER — IOPAMIDOL (ISOVUE-300) INJECTION 61%
100.0000 mL | Freq: Once | INTRAVENOUS | Status: AC | PRN
  Administered 2017-05-05: 100 mL via INTRAVENOUS

## 2017-05-05 MED ORDER — POTASSIUM CHLORIDE CRYS ER 20 MEQ PO TBCR
40.0000 meq | EXTENDED_RELEASE_TABLET | Freq: Once | ORAL | Status: AC
  Administered 2017-05-05: 40 meq via ORAL
  Filled 2017-05-05: qty 2

## 2017-05-05 MED ORDER — SODIUM CHLORIDE 0.9 % IV BOLUS (SEPSIS)
1000.0000 mL | Freq: Once | INTRAVENOUS | Status: AC
  Administered 2017-05-05: 1000 mL via INTRAVENOUS

## 2017-05-05 NOTE — ED Provider Notes (Signed)
[facility] DEPT [facility] Provider Note   CSN: [id number] Arrival date & time: 05/04/17  2324     History   Chief Complaint Chief Complaint  Patient presents with  . Dizziness    HPI Brenda Spencer is a 26 y.o. female.  HPI  This is a 26 year old female who presents with persistent nausea and dizziness. Patient had a gastric sleeve performed February. She states that since that time she's had persistent nausea and at times vomiting.  She is followed up with her surgeon. She is being referred to gastroenterology. She reports lightheadedness. She denies any syncope. She denies room spinning dizziness. She also reports back pain. No urinary symptoms, dysuria, hematuria. She has chronic back pain. She reports normal bowel movements. Denies abdominal pain.  Past Medical History:  Diagnosis Date  . Acute post-traumatic headache, not intractable 01/19/2016  . Asthma    ALbuterol inhaler as needed  . CHF (congestive heart failure) ([place]) 2011   RESOLVED; HAD CARDIAC EVAL   . Chronic back pain    reason unknown  . Congestive heart failure ([place]) 2011   related to preeclampsia  . Nocturia   . Noncompliance   . Postgastrectomy malabsorption     Patient Active Problem List   Diagnosis Date Noted  . Low back pain 09/30/2016  . Obesity, Class II, BMI 35-39.9 07/20/2016  . Chiari I malformation ([place]) 06/09/2016  . Arnold-Chiari malformation, type I ([place]) 01/20/2016  . Exposure to second hand tobacco smoke 01/19/2016  . Wheezing 01/19/2016  . Premature rupture of membranes 09/09/2015  . Chromosome 1 deletion syndrome   . Parent of child with chromosome abnormality   . History of pregnancy induced hypertension 02/28/2015  . History of congestive heart failure 01/09/2013  . Mild persistent asthma 11/18/2012    Past Surgical History:  Procedure Laterality Date  . CHOLECYSTECTOMY     teenager  . SLEEVE GASTROPLASTY  01/18/2017  . SUBOCCIPITAL CRANIECTOMY CERVICAL LAMINECTOMY N/A  06/09/2016   Procedure: Chiari Decompression;  Surgeon: Consuella Lose, MD;  Location: [facility];  Service: Neurosurgery;  Laterality: N/A;  posterior/occipital  . TEAR DUCT PROBING  as a child  . TONSILLECTOMY     as a teenager  . TUBAL LIGATION Bilateral 09/10/2015   Procedure: POST PARTUM TUBAL LIGATION;  Surgeon: Mora Bellman, MD;  Location: [facility];  Service: Gynecology;  Laterality: Bilateral;  . WISDOM TOOTH EXTRACTION     as a teenager    OB History    Gravida Para Term Preterm AB Living   '5 4 3 1 1 4   '$ SAB TAB Ectopic Multiple Live Births   1     0 4      Obstetric Comments   2011 PIH;IOL; PP CHF-HOSPITALIZED X 1 WEEK       Home Medications    Prior to Admission medications   Medication Sig Start Date End Date Taking? Authorizing Provider  albuterol (PROVENTIL) (2.5 MG/3ML) 0.083% nebulizer solution Take 3 mLs (2.5 mg total) by nebulization every 6 (six) hours as needed for wheezing or shortness of breath. 12/24/16   Eustaquio Maize, MD  cephALEXin (KEFLEX) 500 MG capsule Take 1 capsule (500 mg total) by mouth 2 (two) times daily. 04/22/17   Blanchie Dessert, MD  cyclobenzaprine (FLEXERIL) 10 MG tablet Take 1 tablet (10 mg total) by mouth 2 (two) times daily as needed for muscle spasms. 03/07/17   Doristine Devoid, PA-C  HYDROcodone-acetaminophen (NORCO/VICODIN) 5-325 MG tablet Take 1-2 tablets by  mouth every 6 (six) hours as needed. 04/22/17   Blanchie Dessert, MD  methocarbamol (ROBAXIN) 500 MG tablet TAKE 1 TABLET BY ORAL ROUTE EVERY 8 HOURS AS NEEDED FOR MUSCLE SPASMS 07/01/16   [provider]  mupirocin ointment (BACTROBAN) 2 % Use twice a day on affected areas 09/30/16   Eustaquio Maize, MD  omeprazole (PRILOSEC) 20 MG capsule Take 1 capsule (20 mg total) by mouth daily. 04/22/17   Blanchie Dessert, MD  ondansetron (ZOFRAN ODT) 4 MG disintegrating tablet Take 1 tablet (4 mg total) by mouth every 8 (eight) hours as needed for nausea or vomiting.  05/05/17   Sheray Grist, Barbette Hair, MD  ondansetron (ZOFRAN) 4 MG tablet Take 1 tablet (4 mg total) by mouth every 6 (six) hours. 04/22/17   Blanchie Dessert, MD  potassium chloride SA (K-DUR,KLOR-CON) 20 MEQ tablet Take 1 tablet (20 mEq total) by mouth 2 (two) times daily. 03/07/17   Ocie Cornfield T, PA-C  potassium chloride SA (K-DUR,KLOR-CON) 20 MEQ tablet Take 2 tablets (40 mEq total) by mouth daily. 05/05/17   Danile Trier, Barbette Hair, MD    Family History Family History  Problem Relation Age of Onset  . Learning disabilities Other   . Colon cancer Maternal Grandmother   . Cancer Maternal Grandmother        colon  . Asthma Brother   . Learning disabilities Brother   . Asthma Daughter   . Learning disabilities Daughter   . Seizures Daughter   . Chromosomal disorder Daughter        1q21.1 microdeletion  . Cataracts Daughter        congenital  . Anesthesia problems Neg Hx   . Other Neg Hx     Social History Social History  Substance Use Topics  . Smoking status: Never Smoker  . Smokeless tobacco: Never Used  . Alcohol use No     Allergies   Patient has no known allergies.   Review of Systems Review of Systems  Constitutional: Positive for fever.  Respiratory: Negative for shortness of breath.   Cardiovascular: Negative for chest pain.  Gastrointestinal: Positive for nausea and vomiting. Negative for abdominal pain and diarrhea.  Genitourinary: Negative for dysuria.  Musculoskeletal: Positive for back pain.  All other systems reviewed and are negative.    Physical Exam Updated Vital Signs BP 122/90 (BP Location: Right Arm)   Pulse 74   Temp 98.2 F (36.8 C) (Oral)   Resp 18   LMP 04/29/2017   SpO2 100%   Physical Exam  Constitutional: She is oriented to person, place, and time. She appears well-developed and well-nourished. No distress.  HENT:  Head: Normocephalic and atraumatic.  Mucous membranes dry  Cardiovascular: Normal rate, regular rhythm and normal  heart sounds.   Pulmonary/Chest: Effort normal. No respiratory distress. She has no wheezes.  Abdominal: Soft. Bowel sounds are normal. There is no tenderness. There is no guarding.  Abdominal scarring noted, clean dry and intact  Neurological: She is alert and oriented to person, place, and time.  Skin: Skin is warm and dry.  Psychiatric: She has a normal mood and affect.  Nursing note and vitals reviewed.    ED Treatments / Results  Labs (all labs ordered are listed, but only abnormal results are displayed) Labs Reviewed  URINALYSIS, ROUTINE W REFLEX MICROSCOPIC - Abnormal; Notable for the following:       Result Value   Color, Urine ORANGE (*)    APPearance CLOUDY (*)  Bilirubin Urine MODERATE (*)    Ketones, ur 15 (*)    Protein, ur 30 (*)    Leukocytes, UA LARGE (*)    All other components within normal limits  URINALYSIS, MICROSCOPIC (REFLEX) - Abnormal; Notable for the following:    Bacteria, UA FEW (*)    Squamous Epithelial / LPF 6-30 (*)    All other components within normal limits  CBC WITH DIFFERENTIAL/PLATELET - Abnormal; Notable for the following:    Hemoglobin 11.7 (*)    HCT 34.7 (*)    RDW 16.3 (*)    All other components within normal limits  COMPREHENSIVE METABOLIC PANEL - Abnormal; Notable for the following:    Potassium 2.9 (*)    Calcium 7.9 (*)    Total Protein 5.7 (*)    Albumin 3.1 (*)    All other components within normal limits  URINE CULTURE  PREGNANCY, URINE  LIPASE, BLOOD  CBG MONITORING, ED    EKG  EKG Interpretation None       Radiology Ct Abdomen Pelvis W Contrast  Result Date: 05/05/2017 CLINICAL DATA:  Nausea and vomiting for 1 week. NPO due to nausea. Mid abdominal pain, back pain, and headache for 1 month. Post gastric sleeve 01/2017 EXAM: CT ABDOMEN AND PELVIS WITH CONTRAST TECHNIQUE: Multidetector CT imaging of the abdomen and pelvis was performed using the standard protocol following bolus administration of intravenous  contrast. CONTRAST:  168m ISOVUE-300 IOPAMIDOL (ISOVUE-300) INJECTION 61% COMPARISON:  04/22/2017 FINDINGS: Lower chest: The lung bases are clear. Hepatobiliary: Multiple focal liver lesions. Largest lesions are demonstrated at the junction of segments 3 and 4 of the liver, measuring 2.5 cm diameter and in segment 6 of the liver measuring 2.6 cm diameter. Additional smaller lesions are also demonstrated. Lesions are mostly hypoechoic with poorly defined central enhancement. Appearance is similar to previous study. Etiology is indeterminate. In a patient of this age, hepatic adenomas could have this appearance. Consider follow-up with elective MRI for lesion characterization if clinically indicated. Pancreas: Unremarkable. No pancreatic ductal dilatation or surrounding inflammatory changes. Spleen: Normal in size without focal abnormality. Adrenals/Urinary Tract: Adrenal glands are unremarkable. Kidneys are normal, without renal calculi, focal lesion, or hydronephrosis. Bladder is unremarkable. Stomach/Bowel: Postoperative changes consistent with gastric sleeve procedure. Along the line of the anastomosis, there is a circumscribed fluid collection measuring 3.9 cm diameter. This is decreasing since previous study in may be a residual postoperative fluid collection or hematoma. Small bowel and colon are decompressed. Appendix is normal. Vascular/Lymphatic: No significant vascular findings are present. No enlarged abdominal or pelvic lymph nodes. Reproductive: Uterus and bilateral adnexa are unremarkable. Other: No abdominal wall hernia or abnormality. No abdominopelvic ascites. Scattered mildly prominent lymph nodes in the right lower quadrant and pelvis are not pathologically enlarged and likely reactive. Musculoskeletal: No acute or significant osseous findings. IMPRESSION: 1. Postoperative gastric sleeve procedure. Circumscribed fluid collection along the anastomosis is decreasing since previous study and probably  represents a postoperative collection. No evidence of bowel obstruction or inflammation. 2. Multiple liver lesions measuring up to 2.6 cm diameter. Consider follow-up with elective MRI for lesion characterization if clinically indicated. No change since prior study. Electronically Signed   By: WLucienne CapersM.D.   On: 05/05/2017 03:18    Procedures Procedures (including critical care time)  Medications Ordered in ED Medications  oxyCODONE-acetaminophen (PERCOCET/ROXICET) 5-325 MG per tablet 1 tablet (1 tablet Oral Not Given 05/05/17 0108)  potassium chloride SA (K-DUR,KLOR-CON) CR tablet 40 mEq (not administered)  sodium chloride 0.9 % bolus 1,000 mL (1,000 mLs Intravenous New Bag/Given 05/05/17 0020)  fentaNYL (SUBLIMAZE) injection 50 mcg (50 mcg Intravenous Given 05/05/17 0126)  iopamidol (ISOVUE-300) 61 % injection 100 mL (100 mLs Intravenous Contrast Given 05/05/17 0249)     Initial Impression / Assessment and Plan / ED Course  I have reviewed the triage vital signs and the nursing notes.  Pertinent labs & imaging results that were available during my care of the patient were reviewed by me and considered in my medical decision making (see chart for details).     Patient presents with persistent nausea. Also reports dizziness. Is nontoxic on exam. Does clinically appear somewhat dry. 15 ketones in the urine. Patient given fluids. Lab work obtained. Notable for hypokalemia. This was replaced. She is on potassium at home. Will increase to 40 mEq daily 5 days. Repeat CT scan shows resolving fluid collection at the gastric sleeve. She has several liver lesions. Outpatient MRI recommended. I discussed this with the patient. She has rested comfortably while in the ED. She is ambulatory without difficulty. Suspect dizziness likely secondary to dehydration. I have encouraged frequent small meals. Follow-up closely with surgeon and GI.  After history, exam, and medical workup I feel the patient  has been appropriately medically screened and is safe for discharge home. Pertinent diagnoses were discussed with the patient. Patient was given return precautions.   Final Clinical Impressions(s) / ED Diagnoses   Final diagnoses:  Dizziness  Dehydration  Liver masses  Hypokalemia    New Prescriptions New Prescriptions   ONDANSETRON (ZOFRAN ODT) 4 MG DISINTEGRATING TABLET    Take 1 tablet (4 mg total) by mouth every 8 (eight) hours as needed for nausea or vomiting.   POTASSIUM CHLORIDE SA (K-DUR,KLOR-CON) 20 MEQ TABLET    Take 2 tablets (40 mEq total) by mouth daily.     Merryl Hacker, MD 05/05/17 [phone]

## 2017-05-05 NOTE — ED Notes (Signed)
Pt verbalizes understanding of d/c instructions and denies any further needs at this time. 

## 2017-05-05 NOTE — Discharge Instructions (Signed)
You were seen today for nausea and dizziness. This is likely related to dehydration. Take very small frequent meals and follow-up closely with your surgeon. Of note, on her CT scan, you had several liver lesions that cannot be characterized. You likely need an outpatient MRI.

## 2017-05-05 NOTE — Progress Notes (Signed)
  Subjective:   Patient ID: [name], female    DOB: Mar 05, 1991, 26 y.o.   MRN: [id number] CC: Dizziness; No Appetite (Since Gastric sleeve); and Emesis (With meals)  HPI: MYRELLA FAHS is a 26 y.o. female presenting for Dizziness; No Appetite (Since Gastric sleeve); and Emesis (With meals)  Seen in ED last night/early this morning, had low serum postassium Was started on K replacement Continues to have difficulty tolerating food Small sips of liquid stay down, vomiting with most foods  Had CT scan abd showing multiple liver lesions  Seen recently by surgery, s/p gastric sleeve 01/2017 Recommended having her evaluated by GI to rule out gastritis or ulcer  Relevant past medical, surgical, family and social history reviewed. Allergies and medications reviewed and updated. History  Smoking Status  . Never Smoker  Smokeless Tobacco  . Never Used   ROS: Per HPI   Objective:    BP 111/81   Pulse 91   Temp 97.8 F (36.6 C) (Oral)   Ht 5\' 5"  (1.651 m)   Wt 152 lb 6.4 oz (69.1 kg)   LMP 04/29/2017   BMI 25.36 kg/m   Wt Readings from Last 3 Encounters:  05/05/17 152 lb 6.4 oz (69.1 kg)  04/22/17 155 lb (70.3 kg)  03/10/17 174 lb 9.6 oz (79.2 kg)    Gen: NAD, alert, cooperative with exam, NCAT EYES: EOMI, no conjunctival injection, or no icterus ENT:  OP without erythema CV: NRRR, normal S1/S2, no murmur, distal pulses 2+ b/l Resp: CTABL, no wheezes, normal WOB Abd: +BS, soft, NTND. no guarding Ext: No edema, warm Neuro: Alert and oriented, strength equal b/l UE and LE, coordination grossly normal MSK: normal muscle bulk  Assessment & Plan:  Lorelai was seen today for dizziness, no appetite and emesis.  Diagnoses and all orders for this visit:  Protein-calorie malnutrition, unspecified severity ([place]) Refer for eval of gastritis/ulcer following gastric sleeve, now with food intolerance -     Ambulatory referral to Gastroenterology  Generalized abdominal  pain Hurts some with food, no pain now  Nausea and vomiting, intractability of vomiting not specified, unspecified vomiting type Referral to GI as above Cont potassium supplement Return in 2 weeks, sooner if needed  Liver lesion Discussed with pt likely benign per Ct report, will order MRI for further characterization -     MR Abdomen W Wo Contrast; Future   Follow up plan: Return in about 2 weeks (around 05/19/2017). [name], MD [place]

## 2017-05-06 LAB — URINE CULTURE: Culture: <10000 — AB

## 2017-05-08 ENCOUNTER — Encounter (HOSPITAL_COMMUNITY): Payer: Self-pay | Admitting: Emergency Medicine

## 2017-05-08 DIAGNOSIS — R109 Unspecified abdominal pain: Secondary | ICD-10-CM | POA: Diagnosis present

## 2017-05-08 LAB — CBC
HEMATOCRIT: 38.8 % (ref 36.0–46.0)
HEMOGLOBIN: 12.7 g/dL (ref 12.0–15.0)
MCH: 27 pg (ref 26.0–34.0)
MCHC: 32.7 g/dL (ref 30.0–36.0)
MCV: 82.4 fL (ref 78.0–100.0)
Platelets: 287 10*3/uL (ref 150–400)
RBC: 4.71 MIL/uL (ref 3.87–5.11)
RDW: 16.3 % — ABNORMAL HIGH (ref 11.5–15.5)
WBC: 8.9 10*3/uL (ref 4.0–10.5)

## 2017-05-08 LAB — COMPREHENSIVE METABOLIC PANEL
ALT: 24 U/L (ref 14–54)
ANION GAP: 8 (ref 5–15)
AST: 28 U/L (ref 15–41)
Albumin: 3.8 g/dL (ref 3.5–5.0)
Alkaline Phosphatase: 69 U/L (ref 38–126)
BUN: 6 mg/dL (ref 6–20)
CALCIUM: 8.9 mg/dL (ref 8.9–10.3)
CO2: 26 mmol/L (ref 22–32)
Chloride: 106 mmol/L (ref 101–111)
Creatinine, Ser: 0.57 mg/dL (ref 0.44–1.00)
GFR calc non Af Amer: >60 mL/min (ref >60)
Glucose, Bld: 91 mg/dL (ref 65–99)
POTASSIUM: 3.2 mmol/L — AB (ref 3.5–5.1)
SODIUM: 140 mmol/L (ref 135–145)
TOTAL PROTEIN: 6.9 g/dL (ref 6.5–8.1)
Total Bilirubin: 1 mg/dL (ref 0.3–1.2)

## 2017-05-08 LAB — LIPASE, BLOOD: LIPASE: 37 U/L (ref 11–51)

## 2017-05-08 NOTE — ED Triage Notes (Addendum)
Patient c/o nausea and abdominal pain onset of 11 today. Pt states about 3 episodes of emesis today. Pt also c/o lower back pain onset 3 hours ago.   Pt adds she is seeing GI doctor on Wednesday and is awaiting an MRI.

## 2017-05-09 ENCOUNTER — Emergency Department (HOSPITAL_COMMUNITY)
Admission: EM | Admit: 2017-05-09 | Discharge: 2017-05-09 | Disposition: A | Discharge status: Home / Self Care | Payer: Medicaid Other | Attending: Emergency Medicine | Admitting: Emergency Medicine

## 2017-05-11 ENCOUNTER — Encounter (HOSPITAL_COMMUNITY): Payer: Self-pay

## 2017-05-11 ENCOUNTER — Emergency Department (HOSPITAL_COMMUNITY): Payer: Medicaid Other

## 2017-05-11 ENCOUNTER — Emergency Department (HOSPITAL_COMMUNITY)
Admission: EM | Admit: 2017-05-11 | Discharge: 2017-05-11 | Disposition: A | Discharge status: Home / Self Care | Payer: Medicaid Other | Attending: Emergency Medicine | Admitting: Emergency Medicine

## 2017-05-11 DIAGNOSIS — Z79899 Other long term (current) drug therapy: Secondary | ICD-10-CM | POA: Insufficient documentation

## 2017-05-11 DIAGNOSIS — R1011 Right upper quadrant pain: Secondary | ICD-10-CM | POA: Diagnosis present

## 2017-05-11 DIAGNOSIS — I509 Heart failure, unspecified: Secondary | ICD-10-CM | POA: Insufficient documentation

## 2017-05-11 DIAGNOSIS — J45909 Unspecified asthma, uncomplicated: Secondary | ICD-10-CM | POA: Insufficient documentation

## 2017-05-11 LAB — URINALYSIS, ROUTINE W REFLEX MICROSCOPIC
GLUCOSE, UA: NEGATIVE mg/dL
HGB URINE DIPSTICK: NEGATIVE
Ketones, ur: 20 mg/dL — AB
NITRITE: NEGATIVE
PROTEIN: 30 mg/dL — AB
Specific Gravity, Urine: 1.027 (ref 1.005–1.030)
pH: 5 (ref 5.0–8.0)

## 2017-05-11 LAB — CBC
HEMATOCRIT: 42 % (ref 36.0–46.0)
HEMOGLOBIN: 14.1 g/dL (ref 12.0–15.0)
MCH: 27.9 pg (ref 26.0–34.0)
MCHC: 33.6 g/dL (ref 30.0–36.0)
MCV: 83 fL (ref 78.0–100.0)
Platelets: 250 10*3/uL (ref 150–400)
RBC: 5.06 MIL/uL (ref 3.87–5.11)
RDW: 16.6 % — ABNORMAL HIGH (ref 11.5–15.5)
WBC: 8.8 10*3/uL (ref 4.0–10.5)

## 2017-05-11 LAB — LIPASE, BLOOD: Lipase: 30 U/L (ref 11–51)

## 2017-05-11 LAB — COMPREHENSIVE METABOLIC PANEL
ALBUMIN: 3.6 g/dL (ref 3.5–5.0)
ALT: 21 U/L (ref 14–54)
ANION GAP: 10 (ref 5–15)
AST: 22 U/L (ref 15–41)
Alkaline Phosphatase: 85 U/L (ref 38–126)
BILIRUBIN TOTAL: 1.4 mg/dL — AB (ref 0.3–1.2)
BUN: 5 mg/dL — ABNORMAL LOW (ref 6–20)
CALCIUM: 9 mg/dL (ref 8.9–10.3)
CO2: 25 mmol/L (ref 22–32)
Chloride: 102 mmol/L (ref 101–111)
Creatinine, Ser: 0.6 mg/dL (ref 0.44–1.00)
GFR calc non Af Amer: >60 mL/min (ref >60)
GLUCOSE: 86 mg/dL (ref 65–99)
POTASSIUM: 3.4 mmol/L — AB (ref 3.5–5.1)
SODIUM: 137 mmol/L (ref 135–145)
TOTAL PROTEIN: 6.7 g/dL (ref 6.5–8.1)

## 2017-05-11 LAB — RAPID URINE DRUG SCREEN, HOSP PERFORMED
AMPHETAMINES: NOT DETECTED
Barbiturates: NOT DETECTED
Benzodiazepines: NOT DETECTED
Cocaine: NOT DETECTED
OPIATES: POSITIVE — AB
Tetrahydrocannabinol: NOT DETECTED

## 2017-05-11 MED ORDER — MORPHINE SULFATE (PF) 4 MG/ML IV SOLN
4.0000 mg | Freq: Once | INTRAVENOUS | Status: AC
  Administered 2017-05-11: 4 mg via INTRAVENOUS
  Filled 2017-05-11: qty 1

## 2017-05-11 MED ORDER — ONDANSETRON HCL 4 MG/2ML IJ SOLN
4.0000 mg | Freq: Once | INTRAMUSCULAR | Status: AC
  Administered 2017-05-11: 4 mg via INTRAVENOUS
  Filled 2017-05-11: qty 2

## 2017-05-11 NOTE — ED Provider Notes (Signed)
[facility] DEPT Provider Note   CSN: [id number] Arrival date & time: 05/11/17  0746     History   Chief Complaint No chief complaint on file.   HPI Brenda Spencer is a 26 y.o. female with a history of obesity, gastric sleeve surgery in February 2018, and cholecystectomy about 10 years ago. The patient presents with chief complaint of sudden onset right upper quadrant abdominal pain that began yesterday. She complains of severe, intractable, and constant pain in this area. She denies vomiting. She denies postprandial pain. She took 2 hydrocodone from a previous visit this month and states it is not helping her pain. The patient states that she only makes a bowel movement once a week and has not yet taken one this week. She states that this is normal for her after her gastric sleeve surgery because her by mouth intake is minimal. She denies urinary symptoms, vaginal symptoms, lower abdominal pain. She denies cough or symptoms of upper respiratory infection, fevers  HPI  Past Medical History:  Diagnosis Date  . Acute post-traumatic headache, not intractable 01/19/2016  . Asthma    ALbuterol inhaler as needed  . CHF (congestive heart failure) ([place]) 2011   RESOLVED; HAD CARDIAC EVAL   . Chronic back pain    reason unknown  . Congestive heart failure ([place]) 2011   related to preeclampsia  . Nocturia   . Noncompliance   . Postgastrectomy malabsorption     Patient Active Problem List   Diagnosis Date Noted  . Low back pain 09/30/2016  . Obesity, Class II, BMI 35-39.9 07/20/2016  . Chiari I malformation ([place]) 06/09/2016  . Arnold-Chiari malformation, type I ([place]) 01/20/2016  . Exposure to second hand tobacco smoke 01/19/2016  . Wheezing 01/19/2016  . Premature rupture of membranes 09/09/2015  . Chromosome 1 deletion syndrome   . Parent of child with chromosome abnormality   . History of pregnancy induced hypertension 02/28/2015  . History of congestive heart failure  01/09/2013  . Mild persistent asthma 11/18/2012    Past Surgical History:  Procedure Laterality Date  . CHOLECYSTECTOMY     teenager  . SLEEVE GASTROPLASTY  01/18/2017  . SUBOCCIPITAL CRANIECTOMY CERVICAL LAMINECTOMY N/A 06/09/2016   Procedure: Chiari Decompression;  Surgeon: Consuella Lose, MD;  Location: [facility];  Service: Neurosurgery;  Laterality: N/A;  posterior/occipital  . TEAR DUCT PROBING  as a child  . TONSILLECTOMY     as a teenager  . TUBAL LIGATION Bilateral 09/10/2015   Procedure: POST PARTUM TUBAL LIGATION;  Surgeon: Mora Bellman, MD;  Location: [facility];  Service: Gynecology;  Laterality: Bilateral;  . WISDOM TOOTH EXTRACTION     as a teenager    OB History    Gravida Para Term Preterm AB Living   _0 SAB TAB Ectopic Multiple Live Births   1     0 4      Obstetric Comments   2011 PIH;IOL; PP CHF-HOSPITALIZED X 1 WEEK       Home Medications    Prior to Admission medications   Medication Sig Start Date End Date Taking? Authorizing Provider  albuterol (PROVENTIL) (2.5 MG/3ML) 0.083% nebulizer solution Take 3 mLs (2.5 mg total) by nebulization every 6 (six) hours as needed for wheezing or shortness of breath. 12/24/16   Eustaquio Maize, MD  cyclobenzaprine (FLEXERIL) 10 MG tablet Take 1 tablet (10 mg total) by mouth 2 (two) times daily as needed for muscle spasms.  Patient not taking: Reported on 05/09/2017 03/07/17   Ocie Cornfield T, PA-C  HYDROcodone-acetaminophen (NORCO/VICODIN) 5-325 MG tablet Take 1-2 tablets by mouth every 6 (six) hours as needed. Patient not taking: Reported on 05/09/2017 04/22/17   Blanchie Dessert, MD  mupirocin ointment (BACTROBAN) 2 % Use twice a day on affected areas Patient not taking: Reported on 05/09/2017 09/30/16   Eustaquio Maize, MD  omeprazole (PRILOSEC) 20 MG capsule Take 1 capsule (20 mg total) by mouth daily. Patient not taking: Reported on 05/09/2017 04/22/17   Blanchie Dessert, MD  ondansetron (ZOFRAN  ODT) 4 MG disintegrating tablet Take 1 tablet (4 mg total) by mouth every 8 (eight) hours as needed for nausea or vomiting. 05/05/17   Horton, Barbette Hair, MD  ondansetron (ZOFRAN) 4 MG tablet Take 1 tablet (4 mg total) by mouth every 6 (six) hours. Patient not taking: Reported on 05/09/2017 04/22/17   Blanchie Dessert, MD  potassium chloride SA (K-DUR,KLOR-CON) 20 MEQ tablet Take 2 tablets (40 mEq total) by mouth daily. 05/05/17   Horton, Barbette Hair, MD    Family History Family History  Problem Relation Age of Onset  . Learning disabilities Other   . Colon cancer Maternal Grandmother   . Cancer Maternal Grandmother        colon  . Asthma Brother   . Learning disabilities Brother   . Asthma Daughter   . Learning disabilities Daughter   . Seizures Daughter   . Chromosomal disorder Daughter        1q21.1 microdeletion  . Cataracts Daughter        congenital  . Anesthesia problems Neg Hx   . Other Neg Hx     Social History Social History  Substance Use Topics  . Smoking status: Never Smoker  . Smokeless tobacco: Never Used  . Alcohol use No     Allergies   Patient has no known allergies.   Review of Systems Review of Systems Ten systems reviewed and are negative for acute change, except as noted in the HPI.    Physical Exam Updated Vital Signs LMP 04/29/2017   Physical Exam  Constitutional: She is oriented to person, place, and time. She appears well-developed and well-nourished. No distress.  HENT:  Head: Normocephalic and atraumatic.  Eyes: Conjunctivae and EOM are normal. Pupils are equal, round, and reactive to light. No scleral icterus.  Neck: Normal range of motion.  Cardiovascular: Normal rate, regular rhythm and normal heart sounds.  Exam reveals no gallop and no friction rub.   No murmur heard. Pulmonary/Chest: Effort normal and breath sounds normal. No respiratory distress.  Abdominal: Soft. Bowel sounds are normal. She exhibits no distension and no mass.  There is tenderness in the right upper quadrant. There is no rigidity, no rebound, no guarding and no CVA tenderness.    Neurological: She is alert and oriented to person, place, and time.  Skin: Skin is warm and dry. She is not diaphoretic.  Psychiatric: Her behavior is normal.  Nursing note and vitals reviewed.    ED Treatments / Results  Labs (all labs ordered are listed, but only abnormal results are displayed) Labs Reviewed - No data to display  EKG  EKG Interpretation None       Radiology No results found.  Procedures Procedures (including critical care time)  Medications Ordered in ED Medications - No data to display   Initial Impression / Assessment and Plan / ED Course  I have reviewed the triage vital signs and  the nursing notes.  Pertinent labs & imaging results that were available during my care of the patient were reviewed by me and considered in my medical decision making (see chart for details).     The patient with chronic abdominal pain after gastroplasty. She does have a slight elevation in her urine. Bilirubin, but no elevation in her liver enzymes. Her urine appears contaminated, but this is how it normally appears for her. She has rare bacteria, and although she does appear to have pyuria She denies urinary frequency, urgency, or other symptoms of UTI. She has no lower quadrant abdominal or suprapubic abdominal pain. The patient has a follow-up appointment with gastroenterology today. Although she does have liver lesions that need an MRI. She has an outpatient order, which is still pending Medicaid approval. She is afebrile, hemodynamically stable, and I cannot find a reason today to get the MRI. I discussed the case with Dr. Alvino Chapel who agrees with that assessment at this time. I discussed reasons for to seek immediate medical care in the emergency department.  Final Clinical Impressions(s) / ED Diagnoses   Final diagnoses:  RUQ pain    New  Prescriptions New Prescriptions   No medications on file     Margarita Mail, Hershal Coria 05/11/17 1022    Davonna Belling, MD 05/11/17 1520

## 2017-05-11 NOTE — Discharge Instructions (Signed)
PLEASE KEEP YOUR APPOINTMENT WITH YOUR GASTROENTEROLOGIST TOMORROW.   Abdominal (belly) pain can be caused by many things. Your caregiver performed an examination and possibly ordered blood/urine tests and imaging (CT scan, x-rays, ultrasound). Many cases can be observed and treated at home after initial evaluation in the emergency department. Even though you are being discharged home, abdominal pain can be unpredictable. Therefore, you need a repeated exam if your pain does not resolve, returns, or worsens. Most patients with abdominal pain don't have to be admitted to the hospital or have surgery, but serious problems like appendicitis and gallbladder attacks can start out as nonspecific pain. Many abdominal conditions cannot be diagnosed in one visit, so follow-up evaluations are very important. SEEK IMMEDIATE MEDICAL ATTENTION IF: The pain does not go away or becomes severe.  A temperature above 101 develops.  Repeated vomiting occurs (multiple episodes).  The pain becomes localized to portions of the abdomen. The right side could possibly be appendicitis. In an adult, the left lower portion of the abdomen could be colitis or diverticulitis.  Blood is being passed in stools or vomit (bright red or black tarry stools).  Return also if you develop chest pain, difficulty breathing, dizziness or fainting, or become confused, poorly responsive, or inconsolable (young children).

## 2017-05-11 NOTE — ED Notes (Signed)
Saltine crackers and coca cola soda provided to patient - per PA approval.

## 2017-05-11 NOTE — ED Triage Notes (Signed)
Per Pt, PT is coming from home with complaints of RUQ sharp abdominal pain that started yesterday with complaints of associated N/V. Denies Diarrhea, urinary changes, or vaginal discharge. Reports Hx of Gastric Sleeve surgery 01/18/2017.

## 2017-05-13 ENCOUNTER — Telehealth: Payer: Self-pay | Admitting: Pediatrics

## 2017-05-13 NOTE — Telephone Encounter (Signed)
Advised pt our office policy/protocol does not allow any pain meds/narcotics to be prescribed outside of an office visit. Offered to schedule pt an appt but she declined stating she didn't have the $3 for the copay. Advised pt to call back if she would like [place] to schedule her an appt.

## 2017-05-14 ENCOUNTER — Ambulatory Visit: Payer: Medicaid Other | Admitting: Family Medicine

## 2017-05-14 ENCOUNTER — Encounter: Payer: Self-pay | Admitting: Family Medicine

## 2017-05-14 ENCOUNTER — Ambulatory Visit (INDEPENDENT_AMBULATORY_CARE_PROVIDER_SITE_OTHER): Payer: Medicaid Other | Admitting: Family Medicine

## 2017-05-14 VITALS — BP 111/86 | HR 87 | Temp 97.0°F | Ht 65.0 in | Wt 143.0 lb

## 2017-05-14 DIAGNOSIS — R1011 Right upper quadrant pain: Secondary | ICD-10-CM

## 2017-05-14 MED ORDER — KETOROLAC TROMETHAMINE 60 MG/2ML IM SOLN
60.0000 mg | Freq: Once | INTRAMUSCULAR | Status: AC
  Administered 2017-05-14: 60 mg via INTRAMUSCULAR

## 2017-05-14 NOTE — Progress Notes (Signed)
BP 111/86   Pulse 87   Temp 97 F (36.1 C) (Oral)   Ht 5\' 5"  (1.651 m)   Wt 143 lb (64.9 kg)   LMP 04/29/2017   BMI 23.80 kg/m    Subjective:    Patient ID: Brenda Spencer, female    DOB: 14-Jul-1991, 26 y.o.   MRN: [id number]  HPI: Brenda Spencer is a 26 y.o. female presenting on 05/14/2017 for RUQ pain (x 1 week, has been to ER, waiting on MRI to get approved now) and Nausea and vomiting   HPI Right upper quadrant abdominal pain Patient has been having right upper quadrant abdominal pain and nausea and vomiting but it's been going on for one week. She's been to the emergency department twice in this time and they have her scheduled for an EGD next week and did CT scans and did not find anything. They also did urinalysis and did not find anything. She describes the pain as a dull pain that is very severe on the right upper part of her abdomen. She says she is nauseous and has vomiting and has been using Zofran and Phenergan and they do not seem to be helping.patient is already had her gallbladder previously removed  Relevant past medical, surgical, family and social history reviewed and updated as indicated. Interim medical history since our last visit reviewed. Allergies and medications reviewed and updated.  Review of Systems  Constitutional: Negative for chills and fever.  Respiratory: Negative for chest tightness and shortness of breath.   Cardiovascular: Negative for chest pain and leg swelling.  Gastrointestinal: Positive for abdominal pain, nausea and vomiting. Negative for blood in stool, constipation and diarrhea.  Genitourinary: Positive for flank pain (Right). Negative for decreased urine volume, difficulty urinating, dysuria, frequency, hematuria, vaginal bleeding, vaginal discharge and vaginal pain.  Musculoskeletal: Negative for back pain and gait problem.  Skin: Negative for rash.  Neurological: Negative for light-headedness and headaches.    Psychiatric/Behavioral: Negative for agitation and behavioral problems.  All other systems reviewed and are negative.   Per HPI unless specifically indicated above        Objective:    BP 111/86   Pulse 87   Temp 97 F (36.1 C) (Oral)   Ht 5\' 5"  (1.651 m)   Wt 143 lb (64.9 kg)   LMP 04/29/2017   BMI 23.80 kg/m   Wt Readings from Last 3 Encounters:  05/14/17 143 lb (64.9 kg)  05/11/17 149 lb (67.6 kg)  05/08/17 149 lb (67.6 kg)    Physical Exam  Constitutional: She is oriented to person, place, and time. She appears well-developed and well-nourished. No distress.  Eyes: Conjunctivae are normal.  Cardiovascular: Normal rate, regular rhythm, normal heart sounds and intact distal pulses.   No murmur heard. Pulmonary/Chest: Effort normal and breath sounds normal. No respiratory distress. She has no wheezes. She has no rales.  Abdominal: Soft. Bowel sounds are normal. She exhibits no distension. There is no hepatosplenomegaly. There is tenderness in the right upper quadrant. There is no rigidity, no rebound, no guarding and no CVA tenderness. No hernia.  Musculoskeletal: Normal range of motion.  Neurological: She is alert and oriented to person, place, and time. Coordination normal.  Skin: Skin is warm and dry. No rash noted. She is not diaphoretic.  Psychiatric: She has a normal mood and affect. Her behavior is normal.  Nursing note and vitals reviewed.       Assessment & Plan:   Problem List  Items Addressed This Visit    None    Visit Diagnoses    Right upper quadrant abdominal pain    -  Primary   Patient are has appointment for EGD and an MRI of the abdomen, just had urinalysis and CT abdomen all negative, will give Toradol shot   Relevant Medications   ketorolac (TORADOL) injection 60 mg (Start on 05/14/2017  3:30 PM)       Follow up plan: Return if symptoms worsen or fail to improve.  Counseling provided for all of the vaccine components No orders of the  defined types were placed in this encounter.   Caryl Pina, MD [facility] 05/14/2017, 3:18 PM

## 2017-05-19 ENCOUNTER — Ambulatory Visit: Payer: Medicaid Other | Admitting: Pediatrics

## 2017-05-20 ENCOUNTER — Emergency Department (HOSPITAL_BASED_OUTPATIENT_CLINIC_OR_DEPARTMENT_OTHER)
Admission: EM | Admit: 2017-05-20 | Discharge: 2017-05-20 | Disposition: A | Discharge status: Home / Self Care | Payer: Medicaid Other | Attending: Emergency Medicine | Admitting: Emergency Medicine

## 2017-05-20 ENCOUNTER — Ambulatory Visit: Payer: Medicaid Other | Admitting: Pediatrics

## 2017-05-20 ENCOUNTER — Encounter (HOSPITAL_BASED_OUTPATIENT_CLINIC_OR_DEPARTMENT_OTHER): Payer: Self-pay

## 2017-05-20 DIAGNOSIS — J45909 Unspecified asthma, uncomplicated: Secondary | ICD-10-CM | POA: Insufficient documentation

## 2017-05-20 DIAGNOSIS — G2401 Drug induced subacute dyskinesia: Secondary | ICD-10-CM | POA: Diagnosis not present

## 2017-05-20 DIAGNOSIS — I509 Heart failure, unspecified: Secondary | ICD-10-CM | POA: Insufficient documentation

## 2017-05-20 DIAGNOSIS — R220 Localized swelling, mass and lump, head: Secondary | ICD-10-CM | POA: Diagnosis present

## 2017-05-20 DIAGNOSIS — G8929 Other chronic pain: Secondary | ICD-10-CM | POA: Diagnosis not present

## 2017-05-20 DIAGNOSIS — T450X5A Adverse effect of antiallergic and antiemetic drugs, initial encounter: Secondary | ICD-10-CM | POA: Diagnosis not present

## 2017-05-20 MED ORDER — LORAZEPAM 1 MG PO TABS: 1.0000 mg | ORAL_TABLET | Freq: Two times a day (BID) | ORAL | 0 refills | Status: DC | PRN

## 2017-05-20 MED ORDER — LORAZEPAM 2 MG/ML IJ SOLN
1.0000 mg | Freq: Once | INTRAMUSCULAR | Status: AC
  Administered 2017-05-20: 1 mg via INTRAVENOUS
  Filled 2017-05-20: qty 1

## 2017-05-20 MED ORDER — HYDROMORPHONE HCL 1 MG/ML IJ SOLN
1.0000 mg | Freq: Once | INTRAMUSCULAR | Status: AC
  Administered 2017-05-20: 1 mg via INTRAVENOUS
  Filled 2017-05-20: qty 1

## 2017-05-20 MED ORDER — DIPHENHYDRAMINE HCL 25 MG PO CAPS: 25.0000 mg | ORAL_CAPSULE | Freq: Four times a day (QID) | ORAL | 0 refills | Status: DC

## 2017-05-20 NOTE — ED Triage Notes (Addendum)
C/o tongue swelling and jaw pain x 30 min-? If r/t to new med started at 12pm- "for a blood clot in my stomach"-NAD-no noted hives/rash or itching-upon inspection tongue does not appear swollen-pt requesting pain med for jaw pain

## 2017-05-20 NOTE — ED Notes (Signed)
Patient stated that she started a new med for a clot to her abdomen.  She took it at 4pm and she felt that her jaw started to lock up at 4:30pm.  No swelling noted to her tongue.  A purplish blister to left side of her tongue noted and is tender.

## 2017-05-20 NOTE — ED Notes (Addendum)
Patient continue to c/o jaw pain even after hot compress.  Patient stated that it "felt like something is grabbing her jaw and grind her teeth."

## 2017-05-20 NOTE — Discharge Instructions (Signed)
We believe symptoms are due to taking the medication Reglan. Discontinue this medication and do not take in the future. Take Benadryl every 6 hours for the next 48 hours. I'm going to prescribe you Ativan which you can take as needed for worsening symptoms. If he develop abnormal gait, inability to open jaw, abnormal movements of your limbs please return for reevaluation. You can return for worsening symptoms or new worrisome symptoms. Please follow up with your gastroenterologist tomorrow for followup.

## 2017-05-20 NOTE — ED Notes (Signed)
EDP notified of pt's condition

## 2017-05-20 NOTE — ED Notes (Signed)
Pt requesting medication for pain. Pt advised that I did not have any orders from the physician. Pt offered ibuprofen from the standing orders, but pt states she can not take it due to recent gastric surgery. Pt offered ice pack to place on jaw and pt was agreeable to plan.

## 2017-05-20 NOTE — ED Notes (Signed)
Pt teaching provided on medications which she received today that may cause drowsiness. Pt instructed not to drive or operate heavy machinery while taking the prescribed medication. Pt verbalized understanding.

## 2017-05-20 NOTE — ED Provider Notes (Signed)
[facility] DEPT [facility] Provider Note   CSN: [id number] Arrival date & time: 05/20/17  1815     History   Chief Complaint Chief Complaint  Patient presents with  . Oral Swelling  . Jaw Pain    HPI Brenda Spencer is a 26 y.o. female who presents today for oral swelling and jaw pain symptoms that began today around 4:30 PM. The patient was seen yesterday by gastroenterology where she had a EGD done. The patient was discharged with Reglan. She states around 4:00 today she took her first dose of the medication and 30 minutes later started experiencing upper jaw pain bilaterally with associated tongue swelling. She states that she was not able to handle her secretions or open her jaw which prompted her to go to the emergency department. She did not take anything for her pain. Denies injury by metal object that would suggest etiology to be tetanus. She denies fever, chills, eye pain, ear pain, change in voice, difficulty swallowing, shortness of breath, chest pain, abdominal pain, nausea, vomiting. No other medication changes.  Before seeing the patient, she was given a dose of Ativan and pain medication. During collection of the history of present illness the patient's symptoms had fully resolved.  HPI  Past Medical History:  Diagnosis Date  . Acute post-traumatic headache, not intractable 01/19/2016  . Asthma    ALbuterol inhaler as needed  . CHF (congestive heart failure) ([place]) 2011   RESOLVED; HAD CARDIAC EVAL   . Chronic back pain    reason unknown  . Congestive heart failure ([place]) 2011   related to preeclampsia  . Nocturia   . Noncompliance   . Postgastrectomy malabsorption     Patient Active Problem List   Diagnosis Date Noted  . Low back pain 09/30/2016  . Obesity, Class II, BMI 35-39.9 07/20/2016  . Chiari I malformation ([place]) 06/09/2016  . Arnold-Chiari malformation, type I ([place]) 01/20/2016  . Exposure to second hand tobacco smoke 01/19/2016  . Chromosome 1  deletion syndrome   . Parent of child with chromosome abnormality   . History of pregnancy induced hypertension 02/28/2015  . History of congestive heart failure 01/09/2013  . Mild persistent asthma 11/18/2012    Past Surgical History:  Procedure Laterality Date  . CHOLECYSTECTOMY     teenager  . SLEEVE GASTROPLASTY  01/18/2017  . SUBOCCIPITAL CRANIECTOMY CERVICAL LAMINECTOMY N/A 06/09/2016   Procedure: Chiari Decompression;  Surgeon: Consuella Lose, MD;  Location: [facility];  Service: Neurosurgery;  Laterality: N/A;  posterior/occipital  . TEAR DUCT PROBING  as a child  . TONSILLECTOMY     as a teenager  . TUBAL LIGATION Bilateral 09/10/2015   Procedure: POST PARTUM TUBAL LIGATION;  Surgeon: Mora Bellman, MD;  Location: [facility];  Service: Gynecology;  Laterality: Bilateral;  . WISDOM TOOTH EXTRACTION     as a teenager    OB History    Gravida Para Term Preterm AB Living   _0 SAB TAB Ectopic Multiple Live Births   1     0 4      Obstetric Comments   2011 PIH;IOL; PP CHF-HOSPITALIZED X 1 WEEK       Home Medications    Prior to Admission medications   Medication Sig Start Date End Date Taking? Authorizing Provider  albuterol (PROVENTIL) (2.5 MG/3ML) 0.083% nebulizer solution Take 3 mLs (2.5 mg total) by nebulization every 6 (six) hours as needed for wheezing or shortness of  breath. 12/24/16   Eustaquio Maize, MD  cyclobenzaprine (FLEXERIL) 10 MG tablet Take 1 tablet (10 mg total) by mouth 2 (two) times daily as needed for muscle spasms. 03/07/17   Doristine Devoid, PA-C  diphenhydrAMINE (BENADRYL) 25 mg capsule Take 1 capsule (25 mg total) by mouth every 6 (six) hours. 05/20/17 05/22/17  Chanteria Haggard, Barth Kirks, PA-C  LORazepam (ATIVAN) 1 MG tablet Take 1 tablet (1 mg total) by mouth 2 (two) times daily as needed. 05/20/17   Elliotte Marsalis, Barth Kirks, PA-C    Family History Family History  Problem Relation Age of Onset  . Learning disabilities Other   . Colon cancer  Maternal Grandmother   . Cancer Maternal Grandmother        colon  . Asthma Brother   . Learning disabilities Brother   . Asthma Daughter   . Learning disabilities Daughter   . Seizures Daughter   . Chromosomal disorder Daughter        1q21.1 microdeletion  . Cataracts Daughter        congenital  . Anesthesia problems Neg Hx   . Other Neg Hx     Social History Social History  Substance Use Topics  . Smoking status: Never Smoker  . Smokeless tobacco: Never Used  . Alcohol use No     Allergies   Patient has no known allergies.   Review of Systems Review of Systems  All other systems reviewed and are negative.    Physical Exam Updated Vital Signs BP (!) 128/94 (BP Location: Right Arm)   Pulse (!) 104   Temp 99.2 F (37.3 C) (Oral)   Resp 16   Ht 5' (1.524 m)   Wt 65.3 kg (144 lb)   LMP 04/29/2017   SpO2 100%   BMI 28.12 kg/m   Physical Exam  Constitutional: She appears well-developed and well-nourished.  HENT:  Head: Normocephalic and atraumatic.  Mouth/Throat: Uvula is midline, oropharynx is clear and moist and mucous membranes are normal. No oral lesions. No trismus in the jaw. No dental abscesses, uvula swelling or dental caries. No oropharyngeal exudate.  No dental abscess noted. Percussion of teeth revealed no tenderness.   Eyes: Conjunctivae are normal. Pupils are equal, round, and reactive to light.  Neck: Neck supple.  Cardiovascular: Normal rate, regular rhythm and intact distal pulses.   No murmur heard. Pulmonary/Chest: Effort normal and breath sounds normal. She exhibits no tenderness.  Abdominal: Soft. Bowel sounds are normal. There is no tenderness. There is no rebound and no guarding.  Musculoskeletal: She exhibits no edema.       Right wrist: Normal.       Left wrist: Normal.  Lymphadenopathy:    She has no cervical adenopathy.  Neurological: She is alert. She has normal strength and normal reflexes. No cranial nerve deficit (3-12 intact)  or sensory deficit. She displays a negative Romberg sign. Coordination and gait normal.  Skin: No rash noted. She is not diaphoretic.  Psychiatric: She has a normal mood and affect.  Nursing note and vitals reviewed.    ED Treatments / Results  Labs (all labs ordered are listed, but only abnormal results are displayed) Labs Reviewed - No data to display  EKG  EKG Interpretation None       Radiology No results found.  Procedures Procedures (including critical care time)  Medications Ordered in ED Medications  HYDROmorphone (DILAUDID) injection 1 mg (1 mg Intravenous Given 05/20/17 2016)  LORazepam (ATIVAN) injection 1 mg (1  mg Intravenous Given 05/20/17 2016)     Initial Impression / Assessment and Plan / ED Course  I have reviewed the triage vital signs and the nursing notes.  Pertinent labs & imaging results that were available during my care of the patient were reviewed by me and considered in my medical decision making (see chart for details).     This is a 26 y.o. female who took a dose of Reglan today around 4 PM and developed trismus, upper jaw pain, tongue swelling, inability to maintain secretions. Pain medication and Ativan was administered on arrival which resolved the patient's symptoms. She was afebrile and nontoxic appearing. Exam was benign for HEENT, neuro, cardiopulmonary. History does not suggest this would be due to tetanus. Suspect this is likely due to tardive dyskinesia due to Reglan. Will discharge patient home on Benadryl every 6 hours for 48 hours in addition to Ativan when necessary for worsening symptoms. Strict return precautions given. Patient verbalized understanding and reciprocal verification given. Patient to follow up with gastroenterology who saw her yesterday and prescribed medication. The patient is in understanding and agreement with the plan. Return precautions given. Patient told they can return at anytime for worsening or new concerning  symptoms.   The patient was looked up in the [place] controlled substance abuse reporting system before prescribing Ativan.   Final Clinical Impressions(s) / ED Diagnoses   Final diagnoses:  Tardive dyskinesia    New Prescriptions Discharge Medication List as of 05/20/2017  8:56 PM    START taking these medications   Details  diphenhydrAMINE (BENADRYL) 25 mg capsule Take 1 capsule (25 mg total) by mouth every 6 (six) hours., Starting Thu 05/20/2017, Until Sat 05/22/2017, Print    LORazepam (ATIVAN) 1 MG tablet Take 1 tablet (1 mg total) by mouth 2 (two) times daily as needed., Starting Thu 05/20/2017, Print         May, Barth Kirks, PA-C 05/20/17 2349    Jillyn Ledger, PA-C 05/21/17 0029    Jillyn Ledger, PA-C 05/21/17 Charlotte Crumb, MD 05/22/17 1052

## 2017-05-20 NOTE — ED Notes (Signed)
ED Provider at bedside. 

## 2017-05-20 NOTE — ED Notes (Signed)
Patient stated that after she got the Hydromorphone, she feels a lot better and is able to open her mouth without any difficulty.

## 2017-05-20 NOTE — ED Notes (Signed)
Pt reporting no relief in pain from ice pack. Pt requesting to try some heat. Pt provided with a heat pack to apply to area.

## 2017-05-24 ENCOUNTER — Ambulatory Visit (INDEPENDENT_AMBULATORY_CARE_PROVIDER_SITE_OTHER): Payer: Medicaid Other | Admitting: Pediatrics

## 2017-05-24 ENCOUNTER — Encounter: Payer: Self-pay | Admitting: Pediatrics

## 2017-05-24 VITALS — BP 123/85 | HR 79 | Temp 97.1°F | Ht 60.0 in | Wt 141.4 lb

## 2017-05-24 DIAGNOSIS — E46 Unspecified protein-calorie malnutrition: Secondary | ICD-10-CM | POA: Diagnosis not present

## 2017-05-24 DIAGNOSIS — R109 Unspecified abdominal pain: Secondary | ICD-10-CM

## 2017-05-24 MED ORDER — KETOROLAC TROMETHAMINE 60 MG/2ML IM SOLN
30.0000 mg | Freq: Once | INTRAMUSCULAR | Status: AC
  Administered 2017-05-24: 30 mg via INTRAMUSCULAR

## 2017-05-24 MED ORDER — KETOROLAC TROMETHAMINE 30 MG/ML IJ SOLN: 30.0000 mg | Freq: Once | INTRAMUSCULAR | Status: DC

## 2017-05-24 NOTE — Patient Instructions (Signed)
Boost breeze nutritional supplement

## 2017-05-24 NOTE — Addendum Note (Signed)
Addended by: Antonietta Barcelona D on: 05/24/2017 04:59 PM   Modules accepted: Orders

## 2017-05-24 NOTE — Progress Notes (Signed)
  Subjective:   Patient ID: [name], female    DOB: 1991-05-19, 26 y.o.   MRN: [id number] CC: Follow-up (2 week) and Abdominal Pain (still having abd pain)  HPI: RAELEA GOSSE is a 26 y.o. female presenting for Follow-up (2 week) and Abdominal Pain (still having abd pain)  Tried the reglan that was prescribed by GI for gastric motility Caused nausea and vomiting, couldn't keep it down  Still vomiting every time after she eats Can drink soda, tea water without problems Has tried ensure, a sip is fine, anything more causes vomiting  Chicken noodle soup tried, one bite/spoonful will cause vomiting as well  Was given IM shot of toradol two weeks ago with some improvement in pain Has tried hydrocodone up to 10 mg at home, didn't help with pain and had to go to ED  Was taking with GI office this morning about options for nausea and next steps  Relevant past medical, surgical, family and social history reviewed. Allergies and medications reviewed and updated. History  Smoking Status  . Never Smoker  Smokeless Tobacco  . Never Used   ROS: Per HPI   Objective:    BP 123/85   Pulse 79   Temp 97.1 F (36.2 C) (Oral)   Ht 5' (1.524 m)   Wt 141 lb 6.4 oz (64.1 kg)   LMP 04/28/2017   BMI 27.62 kg/m   Wt Readings from Last 3 Encounters:  05/24/17 141 lb 6.4 oz (64.1 kg)  05/20/17 144 lb (65.3 kg)  05/14/17 143 lb (64.9 kg)    Gen: NAD, alert, cooperative with exam, NCAT EYES: EOMI, no conjunctival injection, or no icterus ENT:  OP without erythema LYMPH: no cervical LAD CV: NRRR, normal S1/S2, no murmur, distal pulses 2+ b/l Resp: CTABL, no wheezes, normal WOB Abd: +BS, soft, tender epigastric area with palpation, ND. No rebound, no guarding Ext: No edema, warm Neuro: Alert and oriented  Assessment & Plan:  Breena was seen today for follow-up and abdominal pain.  Diagnoses and all orders for this visit:  Abdominal pain, unspecified abdominal location Cont  follow up with GI Below helped some with pain, will do once more  -     ketorolac (TORADOL) 30 MG/ML injection 30 mg; Inject 1 mL (30 mg total) into the muscle once.  Protein-calorie malnutrition, unspecified severity ([place]) Cont sips of nutritional supplements as able Wait between sips Can try boost breeze  Follow up plan: Return in about 4 weeks (around 06/21/2017). Sooner if needed. [name], MD [place]

## 2017-05-28 ENCOUNTER — Emergency Department (HOSPITAL_COMMUNITY): Payer: Medicaid Other

## 2017-05-28 ENCOUNTER — Emergency Department (HOSPITAL_COMMUNITY)
Admission: EM | Admit: 2017-05-28 | Discharge: 2017-05-28 | Disposition: A | Discharge status: Home / Self Care | Payer: Medicaid Other | Attending: Emergency Medicine | Admitting: Emergency Medicine

## 2017-05-28 ENCOUNTER — Encounter (HOSPITAL_COMMUNITY): Payer: Self-pay | Admitting: Emergency Medicine

## 2017-05-28 ENCOUNTER — Emergency Department (HOSPITAL_BASED_OUTPATIENT_CLINIC_OR_DEPARTMENT_OTHER)
Admit: 2017-05-28 | Discharge: 2017-05-28 | Disposition: A | Discharge status: Home / Self Care | Payer: Medicaid Other | Attending: Emergency Medicine | Admitting: Emergency Medicine

## 2017-05-28 DIAGNOSIS — M79609 Pain in unspecified limb: Secondary | ICD-10-CM

## 2017-05-28 DIAGNOSIS — J45909 Unspecified asthma, uncomplicated: Secondary | ICD-10-CM | POA: Diagnosis not present

## 2017-05-28 DIAGNOSIS — Z452 Encounter for adjustment and management of vascular access device: Secondary | ICD-10-CM | POA: Diagnosis not present

## 2017-05-28 DIAGNOSIS — Z95828 Presence of other vascular implants and grafts: Secondary | ICD-10-CM

## 2017-05-28 DIAGNOSIS — M79622 Pain in left upper arm: Secondary | ICD-10-CM | POA: Insufficient documentation

## 2017-05-28 DIAGNOSIS — Z79899 Other long term (current) drug therapy: Secondary | ICD-10-CM | POA: Diagnosis not present

## 2017-05-28 DIAGNOSIS — I509 Heart failure, unspecified: Secondary | ICD-10-CM | POA: Insufficient documentation

## 2017-05-28 DIAGNOSIS — M79602 Pain in left arm: Secondary | ICD-10-CM

## 2017-05-28 LAB — POCT I-STAT TROPONIN I: Troponin i, poc: 0.01 ng/mL (ref 0.00–0.08)

## 2017-05-28 LAB — BASIC METABOLIC PANEL
ANION GAP: 10 (ref 5–15)
BUN: 8 mg/dL (ref 6–20)
CHLORIDE: 105 mmol/L (ref 101–111)
CO2: 27 mmol/L (ref 22–32)
CREATININE: 0.56 mg/dL (ref 0.44–1.00)
Calcium: 8.9 mg/dL (ref 8.9–10.3)
GFR calc non Af Amer: >60 mL/min (ref >60)
Glucose, Bld: 89 mg/dL (ref 65–99)
Potassium: 3.1 mmol/L — ABNORMAL LOW (ref 3.5–5.1)
Sodium: 142 mmol/L (ref 135–145)

## 2017-05-28 LAB — CBC
HCT: 38.5 % (ref 36.0–46.0)
HEMOGLOBIN: 13.3 g/dL (ref 12.0–15.0)
MCH: 27.9 pg (ref 26.0–34.0)
MCHC: 34.5 g/dL (ref 30.0–36.0)
MCV: 80.9 fL (ref 78.0–100.0)
Platelets: 283 10*3/uL (ref 150–400)
RBC: 4.76 MIL/uL (ref 3.87–5.11)
RDW: 16.9 % — ABNORMAL HIGH (ref 11.5–15.5)
WBC: 9.3 10*3/uL (ref 4.0–10.5)

## 2017-05-28 MED ORDER — OXYCODONE-ACETAMINOPHEN 5-325 MG PO TABS
1.0000 | ORAL_TABLET | Freq: Once | ORAL | Status: AC
  Administered 2017-05-28: 1 via ORAL
  Filled 2017-05-28: qty 1

## 2017-05-28 MED ORDER — POTASSIUM CHLORIDE CRYS ER 20 MEQ PO TBCR
40.0000 meq | EXTENDED_RELEASE_TABLET | Freq: Once | ORAL | Status: AC
  Administered 2017-05-28: 40 meq via ORAL
  Filled 2017-05-28: qty 2

## 2017-05-28 NOTE — Discharge Instructions (Signed)
It was our pleasure to provide your ER care today - we hope that you feel better.  Keep site of iv line/picc line very clean/dry.    Return to ER if worse, redness to area, increased swelling, severe pain, fevers, other concern.  You were given pain medication in the ER - no driving for the next 6 hours.  Follow up with your doctor Monday.

## 2017-05-28 NOTE — Progress Notes (Signed)
**  Preliminary report by tech**  Left upper extremity venous duplex complete. There is no obvious evidence of deep or superficial vein thrombosis involving the left upper extremity. All clearly visualized vessels appear patent and compressible.  Results were given to Dr. Ashok Cordia.  05/28/17 5:59 PM Brenda Spencer RVT

## 2017-05-28 NOTE — ED Provider Notes (Signed)
[facility] DEPT Provider Note   CSN: [id number] Arrival date & time: 05/28/17  1538     History   Chief Complaint Chief Complaint  Patient presents with  . Chest Pain  . Arm Pain    HPI Brenda Spencer is a 26 y.o. female.  Patient c/o pain left upper arm around site of recent PICC line placement.  Patient is s/p gastric sleeve procedure 01/2017, in past couple months recurrent nv, so had picc placed for ivf and possible tpn 2 days ago. Pt states since procedure, pain at site and upper arm. Constant. Dull. Moderate. No arm swelling or redness. No fever or chills. No nv.    The history is provided by the patient.  Chest Pain   Pertinent negatives include no abdominal pain, no back pain, no fever, no headaches, no shortness of breath and no vomiting.  Arm Pain  Associated symptoms include chest pain. Pertinent negatives include no abdominal pain, no headaches and no shortness of breath.    Past Medical History:  Diagnosis Date  . Acute post-traumatic headache, not intractable 01/19/2016  . Asthma    ALbuterol inhaler as needed  . CHF (congestive heart failure) ([place]) 2011   RESOLVED; HAD CARDIAC EVAL   . Chronic back pain    reason unknown  . Congestive heart failure ([place]) 2011   related to preeclampsia  . Nocturia   . Noncompliance   . Postgastrectomy malabsorption     Patient Active Problem List   Diagnosis Date Noted  . Low back pain 09/30/2016  . Obesity, Class II, BMI 35-39.9 07/20/2016  . Chiari I malformation ([place]) 06/09/2016  . Arnold-Chiari malformation, type I ([place]) 01/20/2016  . Exposure to second hand tobacco smoke 01/19/2016  . Chromosome 1 deletion syndrome   . Parent of child with chromosome abnormality   . History of pregnancy induced hypertension 02/28/2015  . History of congestive heart failure 01/09/2013  . Mild persistent asthma 11/18/2012    Past Surgical History:  Procedure Laterality Date  . CHOLECYSTECTOMY     teenager  . SLEEVE  GASTROPLASTY  01/18/2017  . SUBOCCIPITAL CRANIECTOMY CERVICAL LAMINECTOMY N/A 06/09/2016   Procedure: Chiari Decompression;  Surgeon: Consuella Lose, MD;  Location: [facility];  Service: Neurosurgery;  Laterality: N/A;  posterior/occipital  . TEAR DUCT PROBING  as a child  . TONSILLECTOMY     as a teenager  . TUBAL LIGATION Bilateral 09/10/2015   Procedure: POST PARTUM TUBAL LIGATION;  Surgeon: Mora Bellman, MD;  Location: [facility];  Service: Gynecology;  Laterality: Bilateral;  . WISDOM TOOTH EXTRACTION     as a teenager    OB History    Gravida Para Term Preterm AB Living   '5 4 3 1 1 4   '$ SAB TAB Ectopic Multiple Live Births   1     0 4      Obstetric Comments   2011 PIH;IOL; PP CHF-HOSPITALIZED X 1 WEEK       Home Medications    Prior to Admission medications   Medication Sig Start Date End Date Taking? Authorizing Provider  albuterol (PROVENTIL) (2.5 MG/3ML) 0.083% nebulizer solution Take 3 mLs (2.5 mg total) by nebulization every 6 (six) hours as needed for wheezing or shortness of breath. Patient not taking: Reported on 05/28/2017 12/24/16   Eustaquio Maize, MD  cyclobenzaprine (FLEXERIL) 10 MG tablet Take 1 tablet (10 mg total) by mouth 2 (two) times daily as needed for muscle spasms. Patient not taking: Reported  on 05/28/2017 03/07/17   Ocie Cornfield T, PA-C  LORazepam (ATIVAN) 1 MG tablet Take 1 tablet (1 mg total) by mouth 2 (two) times daily as needed. Patient not taking: Reported on 05/28/2017 05/20/17   Maczis, Barth Kirks, PA-C    Family History Family History  Problem Relation Age of Onset  . Learning disabilities Other   . Colon cancer Maternal Grandmother   . Cancer Maternal Grandmother        colon  . Asthma Brother   . Learning disabilities Brother   . Asthma Daughter   . Learning disabilities Daughter   . Seizures Daughter   . Chromosomal disorder Daughter        1q21.1 microdeletion  . Cataracts Daughter        congenital  . Anesthesia problems  Neg Hx   . Other Neg Hx     Social History Social History  Substance Use Topics  . Smoking status: Never Smoker  . Smokeless tobacco: Never Used  . Alcohol use No     Allergies   Metoclopramide and Ondansetron   Review of Systems Review of Systems  Constitutional: Negative for chills and fever.  HENT: Negative for sore throat.   Eyes: Negative for redness.  Respiratory: Negative for shortness of breath.   Cardiovascular: Positive for chest pain.  Gastrointestinal: Negative for abdominal pain and vomiting.  Genitourinary: Negative for flank pain.  Musculoskeletal: Negative for back pain and neck pain.  Skin: Negative for rash.  Neurological: Negative for headaches.  Hematological: Does not bruise/bleed easily.  Psychiatric/Behavioral: Negative for confusion.     Physical Exam Updated Vital Signs BP (!) 129/97   Pulse 90   Temp 97.8 F (36.6 C) (Oral)   Resp 16   LMP 05/21/2017   SpO2 100%   Physical Exam  Constitutional: She appears well-developed and well-nourished. No distress.  HENT:  Head: Atraumatic.  Eyes: Conjunctivae are normal. No scleral icterus.  Neck: Neck supple. No tracheal deviation present.  Cardiovascular: Normal rate and intact distal pulses.   Pulmonary/Chest: Effort normal and breath sounds normal. No respiratory distress.  Abdominal: Normal appearance. She exhibits no distension. There is no tenderness.  Musculoskeletal: She exhibits no edema.  picc site left upper arm with sterile dressing, no sign of infection to area. No lymphangitis or erythematous streaking. No arm swelling. Distal pulses palp. Good rom at shoulder and elbow without pain. No axillary l/a.   Neurological: She is alert.  Skin: Skin is warm and dry. No rash noted. She is not diaphoretic.  Psychiatric: She has a normal mood and affect.  Nursing note and vitals reviewed.    ED Treatments / Results  Labs (all labs ordered are listed, but only abnormal results are  displayed) Results for orders placed or performed during the hospital encounter of 39/76/73  Basic metabolic panel  Result Value Ref Range   Sodium 142 135 - 145 mmol/L   Potassium 3.1 (L) 3.5 - 5.1 mmol/L   Chloride 105 101 - 111 mmol/L   CO2 27 22 - 32 mmol/L   Glucose, Bld 89 65 - 99 mg/dL   BUN 8 6 - 20 mg/dL   Creatinine, Ser 0.56 0.44 - 1.00 mg/dL   Calcium 8.9 8.9 - 10.3 mg/dL   GFR calc non Af Amer >60 >60 mL/min   GFR calc Af Amer >60 >60 mL/min   Anion gap 10 5 - 15  CBC  Result Value Ref Range   WBC 9.3 4.0 -  10.5 K/uL   RBC 4.76 3.87 - 5.11 MIL/uL   Hemoglobin 13.3 12.0 - 15.0 g/dL   HCT 38.5 36.0 - 46.0 %   MCV 80.9 78.0 - 100.0 fL   MCH 27.9 26.0 - 34.0 pg   MCHC 34.5 30.0 - 36.0 g/dL   RDW 16.9 (H) 11.5 - 15.5 %   Platelets 283 150 - 400 K/uL  POCT i-Stat troponin I  Result Value Ref Range   Troponin i, poc 0.01 0.00 - 0.08 ng/mL   Comment 3           Dg Chest 2 View  Result Date: 05/28/2017 CLINICAL DATA:  Left-sided chest pain today. EXAM: CHEST  2 VIEW COMPARISON:  05/11/2017 FINDINGS: Left arm PICC tip at the SVC RA junction. Heart and mediastinal shadows are normal per the lungs are clear. No effusions. No bone abnormalities. IMPRESSION: No active disease. Left arm PICC well positioned with tip at the SVC RA junction. Electronically Signed   By: Nelson Chimes M.D.   On: 05/28/2017 16:20      EKG  EKG Interpretation None       Radiology Dg Chest 2 View  Result Date: 05/28/2017 CLINICAL DATA:  Left-sided chest pain today. EXAM: CHEST  2 VIEW COMPARISON:  05/11/2017 FINDINGS: Left arm PICC tip at the SVC RA junction. Heart and mediastinal shadows are normal per the lungs are clear. No effusions. No bone abnormalities. IMPRESSION: No active disease. Left arm PICC well positioned with tip at the SVC RA junction. Electronically Signed   By: Nelson Chimes M.D.   On: 05/28/2017 16:20    Procedures Procedures (including critical care time)  Medications  Ordered in ED Medications  oxyCODONE-acetaminophen (PERCOCET/ROXICET) 5-325 MG per tablet 1 tablet (not administered)     Initial Impression / Assessment and Plan / ED Course  I have reviewed the triage vital signs and the nursing notes.  Pertinent labs & imaging results that were available during my care of the patient were reviewed by me and considered in my medical decision making (see chart for details).  Pt states has ride, did not drive. Percocet 1 po for pain.   Vascular ultrasound.   U/s neg for dvt.   Discussed results w pt.    Patient demands that picc line be removed.  On 3 separate occasions, given recent placement picc, and reasons noted, I tried to reassure patient, and have her agree to keep picc line atleast until Monday when she could discuss further with her doctors.  Patient insists that picc line be taken out.  She indicates she can eat and drink fine, and does want picc, ivf, or iv nutrition. States she is not leaving until line removed.      Final Clinical Impressions(s) / ED Diagnoses   Final diagnoses:  None    New Prescriptions New Prescriptions   No medications on file     Lajean Saver, MD 05/28/17 1810

## 2017-05-28 NOTE — ED Notes (Signed)
Pt able to tolerate PO water.

## 2017-05-28 NOTE — ED Triage Notes (Signed)
Pt c/o left arm pain radiating to chest at site of PICC line onset yesterday. PICC line placed Wednesday. No SOB.

## 2017-06-14 ENCOUNTER — Encounter: Payer: Self-pay | Admitting: Pediatrics

## 2017-06-14 ENCOUNTER — Ambulatory Visit (INDEPENDENT_AMBULATORY_CARE_PROVIDER_SITE_OTHER): Payer: Medicaid Other | Admitting: Pediatrics

## 2017-06-14 VITALS — BP 111/81 | HR 97 | Temp 97.2°F | Ht 60.0 in | Wt 136.6 lb

## 2017-06-14 DIAGNOSIS — J069 Acute upper respiratory infection, unspecified: Secondary | ICD-10-CM | POA: Diagnosis not present

## 2017-06-14 DIAGNOSIS — R1084 Generalized abdominal pain: Secondary | ICD-10-CM | POA: Diagnosis not present

## 2017-06-14 DIAGNOSIS — R05 Cough: Secondary | ICD-10-CM | POA: Diagnosis not present

## 2017-06-14 DIAGNOSIS — R059 Cough, unspecified: Secondary | ICD-10-CM

## 2017-06-14 DIAGNOSIS — H60331 Swimmer's ear, right ear: Secondary | ICD-10-CM

## 2017-06-14 MED ORDER — CIPROFLOXACIN-DEXAMETHASONE 0.3-0.1 % OT SUSP: 4.0000 [drp] | Freq: Two times a day (BID) | OTIC | 0 refills | Status: DC

## 2017-06-14 MED ORDER — GUAIFENESIN-CODEINE 100-10 MG/5ML PO SOLN: 5.0000 mL | Freq: Three times a day (TID) | ORAL | 0 refills | Status: DC | PRN

## 2017-06-14 NOTE — Progress Notes (Signed)
  Subjective:   Patient ID: Brenda Spencer, female    DOB: July 16, 1991, 26 y.o.   MRN: [id number] CC: Cough; Ear Pain (right); and Sore Throat  HPI: Brenda Spencer is a 26 y.o. female presenting for Cough; Ear Pain (right); and Sore Throat  Started symptoms two days ago Has been swimming a lot past week Ear pain started first Next day with sore throat, congestion Some coughing No fevers Appetite has been improved last two days Ate four chicken nuggets this morning, no nausea or vomiting Cough keeps her awake at night  Relevant past medical, surgical, family and social history reviewed. Allergies and medications reviewed and updated. History  Smoking Status  . Never Smoker  Smokeless Tobacco  . Never Used   ROS: Per HPI   Objective:    BP 111/81   Pulse 97   Temp 97.2 F (36.2 C) (Oral)   Ht 5' (1.524 m)   Wt 136 lb 9.6 oz (62 kg)   LMP 05/21/2017   BMI 26.68 kg/m   Wt Readings from Last 3 Encounters:  06/14/17 136 lb 9.6 oz (62 kg)  05/24/17 141 lb 6.4 oz (64.1 kg)  05/20/17 144 lb (65.3 kg)    Gen: NAD, alert, cooperative with exam, NCAT, congested EYES: EOMI, no conjunctival injection, or no icterus ENT:  TMs pearly gray b/l, R external canal red and tender with white debris, OP without erythema LYMPH: small < 0.5 cm ant cervical LAD b/l CV: NRRR, normal S1/S2, no murmur, distal pulses 2+ b/l Resp: CTABL, no wheezes, normal WOB Abd: +BS, soft, NTND. no guarding or organomegaly Ext: No edema, warm Neuro: Alert and oriented  Assessment & Plan:  Brenda Spencer was seen today for cough, ear pain and sore throat.  Diagnoses and all orders for this visit:  Acute URI Discussed symptomatic care, return precautions May continue to have symptoms for more than a week  Cough Use as needed, will cause sleepiness Has helped in the past -     guaiFENesin-codeine 100-10 MG/5ML syrup; Take 5-10 mLs by mouth 3 (three) times daily as needed for cough.  Acute swimmer's  ear of right side Treat with below -     ciprofloxacin-dexamethasone (CIPRODEX) OTIC suspension; Place 4 drops into the right ear 2 (two) times daily.  Generalized abdominal pain Improved today Appetite better past two days Following with GI and surgery for pain and anorexia following bariatric surg  Follow up plan: Return in about 3 months (around 09/14/2017). [name], MD [place]

## 2017-06-24 ENCOUNTER — Ambulatory Visit: Payer: Medicaid Other | Admitting: Pediatrics

## 2017-06-25 ENCOUNTER — Ambulatory Visit (INDEPENDENT_AMBULATORY_CARE_PROVIDER_SITE_OTHER): Payer: Medicaid Other | Admitting: Pediatrics

## 2017-06-25 ENCOUNTER — Encounter: Payer: Self-pay | Admitting: Pediatrics

## 2017-06-25 VITALS — BP 106/72 | HR 111 | Temp 97.8°F | Ht 60.0 in | Wt 135.4 lb

## 2017-06-25 DIAGNOSIS — L259 Unspecified contact dermatitis, unspecified cause: Secondary | ICD-10-CM | POA: Diagnosis not present

## 2017-06-25 MED ORDER — TRIAMCINOLONE ACETONIDE 0.025 % EX OINT: 1.0000 "application " | TOPICAL_OINTMENT | Freq: Two times a day (BID) | CUTANEOUS | 0 refills | Status: DC

## 2017-06-25 NOTE — Progress Notes (Signed)
  Subjective:   Patient ID: Brenda Spencer, female    DOB: January 14, 1991, 26 y.o.   MRN: [id number] CC: Rash (Arms and legs, 4 days, itches)  HPI: Brenda Spencer is a 26 y.o. female presenting for Rash (Arms and legs, 4 days, itches)  Here today with her husband  Used husband's lotion that has baby oil in it, otherwise cant think of any new products or exposures No change in medicines, no new medicines Primarily on arms and legs, hasnt spread past day No other family members itching Has been outside but not in woods or around poison ivy No fevers, feeling well otherwise  Tried poison ivy medicine OTC, OTC hydrocortisone at home with minimal improvement  Appetite remains improved Cant eat a lot at a time but is tolerating food multiple times a day  Relevant past medical, surgical, family and social history reviewed. Allergies and medications reviewed and updated. History  Smoking Status  . Never Smoker  Smokeless Tobacco  . Never Used   ROS: Per HPI   Objective:    BP 106/72   Pulse (!) 111   Temp 97.8 F (36.6 C) (Oral)   Ht 5' (1.524 m)   Wt 135 lb 6.4 oz (61.4 kg)   BMI 26.44 kg/m   Wt Readings from Last 3 Encounters:  06/25/17 135 lb 6.4 oz (61.4 kg)  06/14/17 136 lb 9.6 oz (62 kg)  05/24/17 141 lb 6.4 oz (64.1 kg)    Gen: NAD, alert, cooperative with exam, NCAT EYES: EOMI, no conjunctival injection, or no icterus CV: NRRR, normal S1/S2, no murmur, distal pulses 2+ b/l Resp: CTABL, no wheezes, normal WOB Abd: +BS, soft, NTND. no guarding or organomegaly Ext: No edema, warm Neuro: Alert and oriented Skin: b/l forearms and legs with small 60mm or less flesh colored and red papules scattered with excoriations No rash on feet or hands or trunk  Assessment & Plan:  Brenda Spencer was seen today for rash.  Diagnoses and all orders for this visit:  Contact dermatitis, unspecified contact dermatitis type, unspecified trigger No burrows Will treat as contact  dermatitis, stop all new products Use triamcinolone twice a day, gave IM depomedrol 80mg  today Return precautions discussed  Follow up plan: Return if symptoms worsen or fail to improve. [name], MD [place]

## 2017-07-31 ENCOUNTER — Emergency Department (HOSPITAL_COMMUNITY): Payer: Medicaid Other

## 2017-07-31 ENCOUNTER — Encounter (HOSPITAL_COMMUNITY): Payer: Self-pay | Admitting: Emergency Medicine

## 2017-07-31 ENCOUNTER — Emergency Department (HOSPITAL_COMMUNITY)
Admission: EM | Admit: 2017-07-31 | Discharge: 2017-07-31 | Disposition: A | Discharge status: Home / Self Care | Payer: Medicaid Other | Attending: Emergency Medicine | Admitting: Emergency Medicine

## 2017-07-31 DIAGNOSIS — J45909 Unspecified asthma, uncomplicated: Secondary | ICD-10-CM | POA: Diagnosis not present

## 2017-07-31 DIAGNOSIS — R1011 Right upper quadrant pain: Secondary | ICD-10-CM | POA: Diagnosis present

## 2017-07-31 DIAGNOSIS — G8929 Other chronic pain: Secondary | ICD-10-CM | POA: Diagnosis not present

## 2017-07-31 DIAGNOSIS — R112 Nausea with vomiting, unspecified: Secondary | ICD-10-CM | POA: Diagnosis not present

## 2017-07-31 DIAGNOSIS — I509 Heart failure, unspecified: Secondary | ICD-10-CM | POA: Diagnosis not present

## 2017-07-31 DIAGNOSIS — M545 Low back pain: Secondary | ICD-10-CM | POA: Insufficient documentation

## 2017-07-31 LAB — LIPASE, BLOOD: Lipase: 39 U/L (ref 11–51)

## 2017-07-31 LAB — URINALYSIS, ROUTINE W REFLEX MICROSCOPIC
Bacteria, UA: NONE SEEN
Bilirubin Urine: NEGATIVE
Glucose, UA: NEGATIVE mg/dL
Hgb urine dipstick: NEGATIVE
Ketones, ur: NEGATIVE mg/dL
Nitrite: NEGATIVE
Protein, ur: NEGATIVE mg/dL
RBC / HPF: NONE SEEN RBC/hpf (ref 0–5)
Specific Gravity, Urine: 1.015 (ref 1.005–1.030)
pH: 5 (ref 5.0–8.0)

## 2017-07-31 LAB — COMPREHENSIVE METABOLIC PANEL
ALT: 15 U/L (ref 14–54)
AST: 17 U/L (ref 15–41)
Albumin: 3.7 g/dL (ref 3.5–5.0)
Alkaline Phosphatase: 66 U/L (ref 38–126)
Anion gap: 10 (ref 5–15)
BUN: 7 mg/dL (ref 6–20)
CO2: 20 mmol/L — ABNORMAL LOW (ref 22–32)
Calcium: 8.7 mg/dL — ABNORMAL LOW (ref 8.9–10.3)
Chloride: 108 mmol/L (ref 101–111)
Creatinine, Ser: 0.57 mg/dL (ref 0.44–1.00)
GFR calc Af Amer: >60 mL/min (ref >60)
GFR calc non Af Amer: >60 mL/min (ref >60)
Glucose, Bld: 94 mg/dL (ref 65–99)
Potassium: 3.7 mmol/L (ref 3.5–5.1)
Sodium: 138 mmol/L (ref 135–145)
Total Bilirubin: 0.9 mg/dL (ref 0.3–1.2)
Total Protein: 6.3 g/dL — ABNORMAL LOW (ref 6.5–8.1)

## 2017-07-31 LAB — CBC
HCT: 37.7 % (ref 36.0–46.0)
Hemoglobin: 12.8 g/dL (ref 12.0–15.0)
MCH: 30 pg (ref 26.0–34.0)
MCHC: 34 g/dL (ref 30.0–36.0)
MCV: 88.5 fL (ref 78.0–100.0)
Platelets: 287 10*3/uL (ref 150–400)
RBC: 4.26 MIL/uL (ref 3.87–5.11)
RDW: 13.5 % (ref 11.5–15.5)
WBC: 7.1 10*3/uL (ref 4.0–10.5)

## 2017-07-31 LAB — POC URINE PREG, ED: Preg Test, Ur: NEGATIVE

## 2017-07-31 MED ORDER — GI COCKTAIL ~~LOC~~
30.0000 mL | Freq: Once | ORAL | Status: AC
  Administered 2017-07-31: 30 mL via ORAL
  Filled 2017-07-31: qty 30

## 2017-07-31 MED ORDER — PROMETHAZINE HCL 25 MG/ML IJ SOLN
12.5000 mg | Freq: Once | INTRAMUSCULAR | Status: AC
  Administered 2017-07-31: 12.5 mg via INTRAVENOUS
  Filled 2017-07-31: qty 1

## 2017-07-31 MED ORDER — IOPAMIDOL (ISOVUE-300) INJECTION 61%
INTRAVENOUS | Status: AC
  Administered 2017-07-31: 75 mL
  Filled 2017-07-31: qty 100

## 2017-07-31 MED ORDER — PROMETHAZINE HCL 25 MG PO TABS: 25.0000 mg | ORAL_TABLET | Freq: Four times a day (QID) | ORAL | 0 refills | Status: DC | PRN

## 2017-07-31 NOTE — ED Triage Notes (Signed)
Pt arrives to ED with RUQ abd pain x11month. Pt has also had constant nausea with vomiting.

## 2017-07-31 NOTE — Discharge Instructions (Signed)
Please read attached information. If you experience any new or worsening signs or symptoms please return to the emergency room for evaluation. Please follow-up with your primary care provider or specialist as discussed. Please use medication prescribed only as directed and discontinue taking if you have any concerning signs or symptoms.   °

## 2017-07-31 NOTE — ED Provider Notes (Signed)
[facility] DEPT Provider Note   CSN: [id number] Arrival date & time: 07/31/17  0907     History   Chief Complaint Chief Complaint  Patient presents with  . Abdominal Pain    HPI Brenda Spencer is a 27 y.o. female.  HPI   26 year old female with past medical history of cholecystectomy, gastric sleeve presents today with complaints of abdominal pain.  Patient reports since her surgery she has had off and on abdominal pain.  Patient notes that usually it comes and goes, but has been persistent over the last few days.  She describes this as epigastric and right upper quadrant sharp pains.  Patient notes she has had some nausea and vomiting, reports normal bowel movements, no lower abdominal pain, or urinary complaints.  Patient denies any fever at home.  Patient reports that she has tried Tylenol but does not provide any relief of her abdominal pain.  Patient also notes that she is having low back pain, notes this is chronic, denies any distal neurovascular complaints, again Tylenol not improving her symptoms.  Patient reports she has been followed by her general surgeon, and was instructed to come in for follow-up evaluation but reports she was unable to afford this.   Past Medical History:  Diagnosis Date  . Acute post-traumatic headache, not intractable 01/19/2016  . Asthma    ALbuterol inhaler as needed  . CHF (congestive heart failure) ([place]) 2011   RESOLVED; HAD CARDIAC EVAL   . Chronic back pain    reason unknown  . Congestive heart failure ([place]) 2011   related to preeclampsia  . Nocturia   . Noncompliance   . Postgastrectomy malabsorption     Patient Active Problem List   Diagnosis Date Noted  . Low back pain 09/30/2016  . Obesity, Class II, BMI 35-39.9 07/20/2016  . Chiari I malformation ([place]) 06/09/2016  . Arnold-Chiari malformation, type I ([place]) 01/20/2016  . Exposure to second hand tobacco smoke 01/19/2016  . Chromosome 1 deletion syndrome   . Parent of  child with chromosome abnormality   . History of pregnancy induced hypertension 02/28/2015  . History of congestive heart failure 01/09/2013  . Mild persistent asthma 11/18/2012    Past Surgical History:  Procedure Laterality Date  . CHOLECYSTECTOMY     teenager  . SLEEVE GASTROPLASTY  01/18/2017  . SUBOCCIPITAL CRANIECTOMY CERVICAL LAMINECTOMY N/A 06/09/2016   Procedure: Chiari Decompression;  Surgeon: Consuella Lose, MD;  Location: [facility];  Service: Neurosurgery;  Laterality: N/A;  posterior/occipital  . TEAR DUCT PROBING  as a child  . TONSILLECTOMY     as a teenager  . TUBAL LIGATION Bilateral 09/10/2015   Procedure: POST PARTUM TUBAL LIGATION;  Surgeon: Mora Bellman, MD;  Location: [facility];  Service: Gynecology;  Laterality: Bilateral;  . WISDOM TOOTH EXTRACTION     as a teenager    OB History    Gravida Para Term Preterm AB Living   '5 4 3 1 1 4   '$ SAB TAB Ectopic Multiple Live Births   1     0 4      Obstetric Comments   2011 PIH;IOL; PP CHF-HOSPITALIZED X 1 WEEK       Home Medications    Prior to Admission medications   Medication Sig Start Date End Date Taking? Authorizing Provider  albuterol (PROVENTIL) (2.5 MG/3ML) 0.083% nebulizer solution Take 3 mLs (2.5 mg total) by nebulization every 6 (six) hours as needed for wheezing or shortness of breath. Patient  not taking: Reported on 07/31/2017 12/24/16   Eustaquio Maize, MD  cyclobenzaprine (FLEXERIL) 10 MG tablet Take 1 tablet (10 mg total) by mouth 2 (two) times daily as needed for muscle spasms. Patient not taking: Reported on 07/31/2017 03/07/17   Doristine Devoid, PA-C  promethazine (PHENERGAN) 25 MG tablet Take 1 tablet (25 mg total) by mouth every 6 (six) hours as needed for nausea or vomiting. 07/31/17   Marciel Offenberger, Dellis Filbert, PA-C  triamcinolone (KENALOG) 0.025 % ointment Apply 1 application topically 2 (two) times daily. Patient not taking: Reported on 07/31/2017 06/25/17   Eustaquio Maize, MD    Family  History Family History  Problem Relation Age of Onset  . Learning disabilities Other   . Colon cancer Maternal Grandmother   . Cancer Maternal Grandmother        colon  . Asthma Brother   . Learning disabilities Brother   . Asthma Daughter   . Learning disabilities Daughter   . Seizures Daughter   . Chromosomal disorder Daughter        1q21.1 microdeletion  . Cataracts Daughter        congenital  . Anesthesia problems Neg Hx   . Other Neg Hx     Social History Social History  Substance Use Topics  . Smoking status: Never Smoker  . Smokeless tobacco: Never Used  . Alcohol use No     Allergies   Metoclopramide and Ondansetron   Review of Systems Review of Systems   Physical Exam Updated Vital Signs BP 108/74   Pulse 75   Temp 98.1 F (36.7 C) (Oral)   Resp 17   LMP 07/31/2017   SpO2 100%   Physical Exam  Constitutional: She is oriented to person, place, and time. She appears well-developed and well-nourished.  HENT:  Head: Normocephalic and atraumatic.  Eyes: Pupils are equal, round, and reactive to light. Conjunctivae are normal. Right eye exhibits no discharge. Left eye exhibits no discharge. No scleral icterus.  Neck: Normal range of motion. No JVD present. No tracheal deviation present.  Pulmonary/Chest: Effort normal. No stridor.  Abdominal: Soft. Bowel sounds are normal. She exhibits no distension and no mass. There is tenderness. There is no rebound and no guarding. No hernia.  Trocar scars clean with no surrounding infection, bowel sounds normal and heard throughout, tenderness palpation in the epigastric and right upper quadrant, remainder of abdominal exam nontender and soft  Musculoskeletal:  No CT or L-spine tenderness palpation.  Tenderness to palpation of the bilateral lower lumbar soft tissue-distal sensation strength and motor function intact.  Neurological: She is alert and oriented to person, place, and time. Coordination normal.  Skin: Skin  is warm.  Psychiatric: She has a normal mood and affect. Her behavior is normal. Judgment and thought content normal.  Nursing note and vitals reviewed.    ED Treatments / Results  Labs (all labs ordered are listed, but only abnormal results are displayed) Labs Reviewed  COMPREHENSIVE METABOLIC PANEL - Abnormal; Notable for the following:       Result Value   CO2 20 (*)    Calcium 8.7 (*)    Total Protein 6.3 (*)    All other components within normal limits  URINALYSIS, ROUTINE W REFLEX MICROSCOPIC - Abnormal; Notable for the following:    APPearance HAZY (*)    Leukocytes, UA MODERATE (*)    Squamous Epithelial / LPF 0-5 (*)    All other components within normal limits  LIPASE, BLOOD  CBC  POC URINE PREG, ED    EKG  EKG Interpretation None       Radiology Ct Abdomen Pelvis W Contrast  Result Date: 07/31/2017 CLINICAL DATA:  Right upper quadrant pain, nausea, vomiting for 1 month EXAM: CT ABDOMEN AND PELVIS WITH CONTRAST TECHNIQUE: Multidetector CT imaging of the abdomen and pelvis was performed using the standard protocol following bolus administration of intravenous contrast. CONTRAST:  89m ISOVUE-300 IOPAMIDOL (ISOVUE-300) INJECTION 61% COMPARISON:  05/05/2017 FINDINGS: Lower chest: No acute abnormality. Hepatobiliary: Focal low attenuation adjacent to the falciform ligament consistent with focal fatty deposition. No other focal liver abnormality is seen. Status post cholecystectomy. No biliary dilatation. Pancreas: Unremarkable. No pancreatic ductal dilatation or surrounding inflammatory changes. Spleen: Normal in size without focal abnormality. Adrenals/Urinary Tract: Adrenal glands are unremarkable. Kidneys are normal, without renal calculi, focal lesion, or hydronephrosis. Bladder is unremarkable. Stomach/Bowel: Prior gastric sleeve procedure with postsurgical changes. Appendix appears normal. No evidence of bowel wall thickening, distention, or inflammatory changes.  Vascular/Lymphatic: No significant vascular findings are present. No enlarged abdominal or pelvic lymph nodes. Reproductive: Uterus and bilateral adnexa are unremarkable. Other: No abdominal wall hernia or abnormality. No abdominopelvic ascites. Musculoskeletal: No acute or significant osseous findings. IMPRESSION: 1. No acute abdominal or pelvic pathology. Electronically Signed   By: HKathreen Devoid  On: 07/31/2017 13:33    Procedures Procedures (including critical care time)  Medications Ordered in ED Medications  gi cocktail (Maalox,Lidocaine,Donnatal) (30 mLs Oral Given 07/31/17 1054)  promethazine (PHENERGAN) injection 12.5 mg (12.5 mg Intravenous Given 07/31/17 1054)  iopamidol (ISOVUE-300) 61 % injection (75 mLs  Contrast Given 07/31/17 1301)     Initial Impression / Assessment and Plan / ED Course  I have reviewed the triage vital signs and the nursing notes.  Pertinent labs & imaging results that were available during my care of the patient were reviewed by me and considered in my medical decision making (see chart for details).      Final Clinical Impressions(s) / ED Diagnoses   Final diagnoses:  Right upper quadrant abdominal pain   Assessment/Plan: 26year old female presents today with abdominal pain.  Patient has a history of the same.  This appears chronic in nature.  Patient has reassuring laboratory analysis, normal CT scan.  Very low suspicion for acute intra-abdominal pathology.  She is well-appearing tolerating p.o.  She will be discharged home with outpatient follow-up and strict return precautions.  She verbalized understanding and agreement to today's plan had no further questions or concerns the time discharge.   New Prescriptions New Prescriptions   PROMETHAZINE (PHENERGAN) 25 MG TABLET    Take 1 tablet (25 mg total) by mouth every 6 (six) hours as needed for nausea or vomiting.     HOkey Regal PA-C 07/31/17 1402    KVirgel Manifold MD 08/01/17 1146

## 2017-08-13 ENCOUNTER — Encounter: Payer: Self-pay | Admitting: Family

## 2017-08-13 ENCOUNTER — Other Ambulatory Visit: Payer: Self-pay | Admitting: Family

## 2017-08-13 ENCOUNTER — Ambulatory Visit (INDEPENDENT_AMBULATORY_CARE_PROVIDER_SITE_OTHER): Payer: Medicaid Other | Admitting: Family

## 2017-08-13 VITALS — BP 105/76 | HR 80 | Temp 97.5°F | Ht 60.0 in | Wt 132.0 lb

## 2017-08-13 DIAGNOSIS — R350 Frequency of micturition: Secondary | ICD-10-CM

## 2017-08-13 DIAGNOSIS — N898 Other specified noninflammatory disorders of vagina: Secondary | ICD-10-CM | POA: Diagnosis not present

## 2017-08-13 DIAGNOSIS — Z9189 Other specified personal risk factors, not elsewhere classified: Secondary | ICD-10-CM

## 2017-08-13 LAB — MICROSCOPIC EXAMINATION: Renal Epithel, UA: NONE SEEN /hpf

## 2017-08-13 LAB — URINALYSIS, COMPLETE
Bilirubin, UA: NEGATIVE
GLUCOSE, UA: NEGATIVE
Ketones, UA: NEGATIVE
NITRITE UA: NEGATIVE
PH UA: 5.5 (ref 5.0–7.5)
RBC, UA: NEGATIVE
Specific Gravity, UA: >1.03 — ABNORMAL HIGH (ref 1.005–1.030)
UUROB: 1 mg/dL (ref 0.2–1.0)

## 2017-08-13 LAB — WET PREP FOR TRICH, YEAST, CLUE
CLUE CELL EXAM: POSITIVE — AB
Trichomonas Exam: NEGATIVE
Yeast Exam: NEGATIVE

## 2017-08-13 MED ORDER — METRONIDAZOLE 500 MG PO TABS: 500.0000 mg | ORAL_TABLET | Freq: Two times a day (BID) | ORAL | 0 refills | Status: DC

## 2017-08-13 NOTE — Patient Instructions (Signed)

## 2017-08-13 NOTE — Progress Notes (Signed)
   Subjective:    Patient ID: Brenda Spencer, female    DOB: [birth year], 26 y.o.   MRN: [id number]  Pt presents to the office today for STD. Pt sexually active and has had 4 different partners in the last year.   Vaginal Discharge  The patient's primary symptoms include vaginal discharge. The patient's pertinent negatives include no genital itching, genital lesions, genital odor, missed menses or pelvic pain. The current episode started more than 1 month ago. The problem occurs intermittently. The problem has been unchanged. The patient is experiencing no pain. Associated symptoms include frequency and urgency. Pertinent negatives include no dysuria, flank pain, hematuria or vomiting. The vaginal discharge was green. The vaginal bleeding is spotting. She has tried nothing for the symptoms. The treatment provided no relief. She is sexually active with multiple partners.        Review of Systems  Gastrointestinal: Negative for vomiting.  Genitourinary: Positive for frequency, urgency and vaginal discharge. Negative for dysuria, flank pain, hematuria, missed menses and pelvic pain.  All other systems reviewed and are negative.      Objective:   Physical Exam  Constitutional: She is oriented to person, place, and time. She appears well-developed and well-nourished. No distress.  HENT:  Head: Normocephalic.  Eyes: Pupils are equal, round, and reactive to light.  Neck: Normal range of motion. Neck supple. No thyromegaly present.  Cardiovascular: Normal rate, regular rhythm, normal heart sounds and intact distal pulses.   No murmur heard. Pulmonary/Chest: Effort normal and breath sounds normal. No respiratory distress. She has no wheezes.  Abdominal: Soft. Bowel sounds are normal. She exhibits no distension. There is no tenderness.  Musculoskeletal: Normal range of motion. She exhibits no edema or tenderness.  Neurological: She is alert and oriented to person, place, and time.  Skin:  Skin is warm and dry.  Psychiatric: She has a normal mood and affect. Her behavior is normal. Judgment and thought content normal.  Vitals reviewed.     BP 105/76   Pulse 80   Temp (!) 97.5 F (36.4 C) (Oral)   Ht 5' (1.524 m)   Wt 132 lb (59.9 kg)   LMP 07/31/2017   BMI 25.78 kg/m      Assessment & Plan:  1. Vaginal discharge - WET PREP FOR [place], YEAST, CLUE - STD Screen (8)  2. Has multiple sexual partners - STD Screen (8)  3. Urinary frequency - Urinalysis, Complete - STD Screen (8)   Safe sex discussed Force fluids Labs pending RTO prn   Evelina Dun, FNP

## 2017-08-13 NOTE — Addendum Note (Signed)
Addended by: Evelina Dun A on: 08/13/2017 12:44 PM   Modules accepted: Orders

## 2017-08-14 LAB — STD SCREEN (8)
HIV Screen 4th Generation wRfx: NONREACTIVE
HSV 1 GLYCOPROTEIN G AB, IGG: 54.6 {index} — AB (ref 0.00–0.90)
Hep A IgM: NEGATIVE
Hep B C IgM: NEGATIVE
Hepatitis B Surface Ag: NEGATIVE
RPR: NONREACTIVE

## 2017-08-17 LAB — GC/CHLAMYDIA PROBE AMP
Chlamydia trachomatis, NAA: POSITIVE — AB
NEISSERIA GONORRHOEAE BY PCR: NEGATIVE

## 2017-08-18 ENCOUNTER — Telehealth: Payer: Self-pay | Admitting: Pediatrics

## 2017-08-18 ENCOUNTER — Encounter: Payer: Self-pay | Admitting: *Deleted

## 2017-08-18 ENCOUNTER — Other Ambulatory Visit: Payer: Self-pay | Admitting: Family

## 2017-08-18 MED ORDER — AZITHROMYCIN 500 MG PO TABS: 1000.0000 mg | ORAL_TABLET | Freq: Every day | ORAL | 0 refills | Status: DC

## 2017-08-18 NOTE — Telephone Encounter (Signed)
Pt aware.

## 2017-08-18 NOTE — Progress Notes (Incomplete)
Communicable disease report completed and faxed to [facility] Department

## 2017-09-17 ENCOUNTER — Encounter: Payer: Self-pay | Admitting: Family Medicine

## 2017-09-17 ENCOUNTER — Ambulatory Visit (INDEPENDENT_AMBULATORY_CARE_PROVIDER_SITE_OTHER): Payer: Medicaid Other | Admitting: Family Medicine

## 2017-09-17 VITALS — BP 118/81 | HR 91 | Temp 97.6°F | Ht 60.0 in | Wt 130.0 lb

## 2017-09-17 DIAGNOSIS — M545 Low back pain: Secondary | ICD-10-CM | POA: Diagnosis not present

## 2017-09-17 DIAGNOSIS — G8929 Other chronic pain: Secondary | ICD-10-CM | POA: Diagnosis not present

## 2017-09-17 MED ORDER — METHYLPREDNISOLONE ACETATE 80 MG/ML IJ SUSP: 80.0000 mg | Freq: Once | INTRAMUSCULAR | Status: DC

## 2017-09-17 MED ORDER — KETOROLAC TROMETHAMINE 30 MG/ML IJ SOLN
30.0000 mg | Freq: Once | INTRAMUSCULAR | Status: AC
  Administered 2017-09-17: 30 mg via INTRAMUSCULAR

## 2017-09-17 NOTE — Patient Instructions (Signed)
You were given a dose of Toradol by injection today. I have also placed a referral to orthopedist for further evaluation and possible consideration of physical therapy. If your back pain worsens, you develop numbness or tingling in her groin area, fecal incontinence or urinary retention, please seek immediate medical attention.   Chronic Back Pain When back pain lasts longer than 3 months, it is called chronic back pain.The cause of your back pain may not be known. Some common causes include:  Wear and tear (degenerative disease) of the bones, ligaments, or disks in your back.  Inflammation and stiffness in your back (arthritis).  People who have chronic back pain often go through certain periods in which the pain is more intense (flare-ups). Many people can learn to manage the pain with home care. Follow these instructions at home: Pay attention to any changes in your symptoms. Take these actions to help with your pain: Activity  Avoid bending and activities that make the problem worse.  Do not sit or stand in one place for long periods of time.  Take brief periods of rest throughout the day. This will reduce your pain. Resting in a lying or standing position is usually better than sitting to rest.  When you are resting for longer periods, mix in some mild activity or stretching between periods of rest. This will help to prevent stiffness and pain.  Get regular exercise. Ask your health care provider what activities are safe for you.  Do not lift anything that is heavier than 10 lb (4.5 kg). Always use proper lifting technique, which includes: ? Bending your knees. ? Keeping the load close to your body. ? Avoiding twisting. Managing pain  If directed, apply ice to the painful area. Your health care provider may recommend applying ice during the first 24-48 hours after a flare-up begins. ? Put ice in a plastic bag. ? Place a towel between your skin and the bag. ? Leave the ice on  for 20 minutes, 2-3 times per day.  After icing, apply heat to the affected area as often as told by your health care provider. Use the heat source that your health care provider recommends, such as a moist heat pack or a heating pad. ? Place a towel between your skin and the heat source. ? Leave the heat on for 20-30 minutes. ? Remove the heat if your skin turns bright red. This is especially important if you are unable to feel pain, heat, or cold. You may have a greater risk of getting burned.  Try soaking in a warm tub.  Take over-the-counter and prescription medicines only as told by your health care provider.  Keep all follow-up visits as told by your health care provider. This is important. Contact a health care provider if:  You have pain that is not relieved with rest or medicine. Get help right away if:  You have weakness or numbness in one or both of your legs or feet.  You have trouble controlling your bladder or your bowels.  You have nausea or vomiting.  You have pain in your abdomen.  You have shortness of breath or you faint. This information is not intended to replace advice given to you by your health care provider. Make sure you discuss any questions you have with your health care provider. Document Released: 01/07/2005 Document Revised: 04/09/2016 Document Reviewed: 05/20/2015 Elsevier Interactive Patient Education  2018 [employer].

## 2017-09-17 NOTE — Progress Notes (Signed)
Subjective: VO:ZDGU pain PCP: Eustaquio Maize, MD [id number] Brenda Spencer is a 26 y.o. female presenting to clinic today for:  1. Back pain Patient reports chronic lower back pain with intermittent flares. She reports last flare was several months ago and was relieved by in office Toradol injection. She denies preceding injury. She reports that onset of chronic bilateral low back pain started during her pregnancy 2 years ago. She describes the pain as sharp and constant. No numbness or tingling. No saddle anesthesia, fecal incontinence or urinary retention. Denies vaginal discharge, dysuria, urinary frequency. She does report an episode 2 weeks ago where she felt like her legs were going to give out. She denies falling. She reports that pain is worse with standing and prolonged ambulation. Relieved somewhat by sitting. She also notes pain is worse with flexion. She does that she is unable to tolerate many oral therapy secondary to her bariatric surgery. She did have physical therapy 2 years ago, which she did feel helped some. She is never seen an orthopedist for this.  Allergies  Allergen Reactions  . Metoclopramide Other (See Comments) and Swelling    Tardive dyskinesia  . Ondansetron Nausea And Vomiting   Past Medical History:  Diagnosis Date  . Acute post-traumatic headache, not intractable 01/19/2016  . Asthma    ALbuterol inhaler as needed  . CHF (congestive heart failure) ([place]) 2011   RESOLVED; HAD CARDIAC EVAL   . Chronic back pain    reason unknown  . Congestive heart failure ([place]) 2011   related to preeclampsia  . Nocturia   . Noncompliance   . Postgastrectomy malabsorption    Family History  Problem Relation Age of Onset  . Learning disabilities Other   . Colon cancer Maternal Grandmother   . Cancer Maternal Grandmother        colon  . Asthma Brother   . Learning disabilities Brother   . Asthma Daughter   . Learning disabilities Daughter   . Seizures Daughter   .  Chromosomal disorder Daughter        1q21.1 microdeletion  . Cataracts Daughter        congenital  . Anesthesia problems Neg Hx   . Other Neg Hx    Social Hx: never smoker.Current medications reviewed.   ROS: Per HPI  Objective: Office vital signs reviewed. BP 118/81   Pulse 91   Temp 97.6 F (36.4 C) (Oral)   Ht 5' (1.524 m)   Wt 130 lb (59 kg)   BMI 25.39 kg/m   Physical Examination:  General: Awake, alert, disheveled appearing, No acute distress MSK: Gait normal but slow. She has full active range of motion in rotation of lumbar spine and extension. She does have pain with pronounced flexion. She has paraspinal muscle tenderness in the lumbosacral area. No midline tenderness to palpation. No bony abnormalities palpable. Negative straight leg raise bilaterally. Neuro: 4/5 lower extremity strength bilaterally. Light touch sensation grossly intact. Normal heel and toe walks.  Assessment/ Plan: 26 y.o. female   1. Chronic bilateral low back pain without sciatica No red flags on exam today. No focal deficits. I am somewhat concerned that she is feeling like her lower extremities want to give out on her. Baseline lumbar films were considered today that our radiology tech is not in office today. We'll defer this to ortho. Because she is never seen an orthopedist for this, I will refer her for further evaluation and intervention. - ketorolac (TORADOL) 30 MG/ML injection  30 mg; Inject 1 mL (30 mg total) into the muscle once. - Ambulatory referral to Orthopedic Surgery   Orders Placed This Encounter  Procedures  . Ambulatory referral to Orthopedic Surgery    Referral Priority:   Routine    Referral Type:   Surgical    Referral Reason:   Specialty Services Required    Requested Specialty:   Orthopedic Surgery    Number of Visits Requested:   1   Meds ordered this encounter  Medications  . DISCONTD: methylPREDNISolone acetate (DEPO-MEDROL) injection 80 mg  . ketorolac (TORADOL)  30 MG/ML injection 30 mg     [name], DO [place] [phone]

## 2017-09-23 ENCOUNTER — Ambulatory Visit (INDEPENDENT_AMBULATORY_CARE_PROVIDER_SITE_OTHER): Payer: Medicaid Other | Admitting: Pediatrics

## 2017-09-23 ENCOUNTER — Encounter: Payer: Self-pay | Admitting: Pediatrics

## 2017-09-23 VITALS — BP 109/83 | HR 77 | Temp 97.6°F | Ht 60.0 in | Wt 131.4 lb

## 2017-09-23 DIAGNOSIS — M545 Low back pain, unspecified: Secondary | ICD-10-CM

## 2017-09-23 DIAGNOSIS — J069 Acute upper respiratory infection, unspecified: Secondary | ICD-10-CM | POA: Diagnosis not present

## 2017-09-23 DIAGNOSIS — R05 Cough: Secondary | ICD-10-CM | POA: Diagnosis not present

## 2017-09-23 DIAGNOSIS — R059 Cough, unspecified: Secondary | ICD-10-CM

## 2017-09-23 DIAGNOSIS — G8929 Other chronic pain: Secondary | ICD-10-CM | POA: Diagnosis not present

## 2017-09-23 MED ORDER — GUAIFENESIN-CODEINE 100-10 MG/5ML PO SOLN: 5.0000 mL | Freq: Three times a day (TID) | ORAL | 0 refills | Status: DC | PRN

## 2017-09-23 MED ORDER — CETIRIZINE HCL 10 MG PO TABS: 10.0000 mg | ORAL_TABLET | Freq: Every day | ORAL | 11 refills | Status: DC

## 2017-09-23 MED ORDER — CYCLOBENZAPRINE HCL 10 MG PO TABS: 10.0000 mg | ORAL_TABLET | Freq: Two times a day (BID) | ORAL | 0 refills | Status: DC | PRN

## 2017-09-23 MED ORDER — DICLOFENAC SODIUM 1 % TD GEL: 2.0000 g | Freq: Four times a day (QID) | TRANSDERMAL | 0 refills | Status: DC

## 2017-09-23 NOTE — Progress Notes (Signed)
  Subjective:   Patient ID: Brenda Spencer, female    DOB: January 19, 1991, 26 y.o.   MRN: [id number] CC: Back Pain (Upper) and Cough  HPI: Brenda Spencer is a 26 y.o. female presenting for Back Pain (Upper) and Cough  Seen last week for back pain Given toradol, helped some but pain is back Still feels sharp pain in back "Everything" makes back pain worse Laying on side is only thing that makes it better Sometimes legs give out underneath her, happens 2-3 times a day Pain started after epidural 2 yrs ago, comes and goes in flares  Has had nose stuffiness for past 2-3 days Appetite is baseline Improving, still not back to normal since gastric bypass surgery No fevers No emesis Coughing some, dry, non-productive, keeps her awake at home Non-smoker No sore throat, ear pain  Relevant past medical, surgical, family and social history reviewed. Allergies and medications reviewed and updated. History  Smoking Status  . Never Smoker  Smokeless Tobacco  . Never Used   ROS: Per HPI   Objective:    BP 109/83   Pulse 77   Temp 97.6 F (36.4 C) (Oral)   Ht 5' (1.524 m)   Wt 131 lb 6.4 oz (59.6 kg)   BMI 25.66 kg/m   Wt Readings from Last 3 Encounters:  09/23/17 131 lb 6.4 oz (59.6 kg)  09/17/17 130 lb (59 kg)  08/13/17 132 lb (59.9 kg)    Gen: NAD, alert, cooperative with exam, NCAT, congested EYES: EOMI, no conjunctival injection, or no icterus ENT:  TMs pearly gray b/l, OP without erythema LYMPH: no cervical LAD CV: NRRR, normal S1/S2, no murmur, distal pulses 2+ b/l Resp: CTABL, no wheezes, normal WOB Ext: No edema, warm Neuro: Alert and oriented, strength equal b/l UE and LE, coordination grossly normal MSK: ttp paraspinal muscles b/l  Assessment & Plan:  Brenda Spencer was seen today for back pain and cough.  Diagnoses and all orders for this visit:  Chronic bilateral low back pain without sciatica Has f/u with ortho upcoming -     cyclobenzaprine (FLEXERIL) 10 MG  tablet; Take 1 tablet (10 mg total) by mouth 2 (two) times daily as needed for muscle spasms. -     diclofenac sodium (VOLTAREN) 1 % GEL; Apply 2 g topically 4 (four) times daily. -     DG Lumbar Spine 2-3 Views; Future  Cough No fevers, keeping her awake at night, Rx for below given, do not take with other medicines that cause sleepiness -     guaiFENesin-codeine 100-10 MG/5ML syrup; Take 5 mLs by mouth 3 (three) times daily as needed for cough.  URI, acute Symptom management discussed -     cetirizine (ZYRTEC) 10 MG tablet; Take 1 tablet (10 mg total) by mouth daily.   Follow up plan: Return in about 7 weeks (around 11/11/2017). [name], MD [place]

## 2017-09-24 MED ORDER — KETOROLAC TROMETHAMINE 30 MG/ML IJ SOLN
30.0000 mg | Freq: Once | INTRAMUSCULAR | Status: AC
  Administered 2017-09-23: 30 mg via INTRAMUSCULAR

## 2017-09-24 NOTE — Addendum Note (Signed)
Addended by: Wardell Heath on: 09/24/2017 10:36 AM   Modules accepted: Orders

## 2017-09-27 ENCOUNTER — Ambulatory Visit (INDEPENDENT_AMBULATORY_CARE_PROVIDER_SITE_OTHER): Payer: Medicaid Other | Admitting: Orthopaedic Surgery

## 2017-09-30 ENCOUNTER — Telehealth: Payer: Self-pay

## 2017-09-30 DIAGNOSIS — G8929 Other chronic pain: Secondary | ICD-10-CM

## 2017-09-30 DIAGNOSIS — M545 Low back pain, unspecified: Secondary | ICD-10-CM

## 2017-09-30 NOTE — Telephone Encounter (Signed)
Diclofenac is non preferred for [place]  Preferred are indomethacin cap., ketorolac tab., meloxicam tab., naproxen tab., sulindac tab.

## 2017-10-01 ENCOUNTER — Encounter: Payer: Self-pay | Admitting: Family Medicine

## 2017-10-01 ENCOUNTER — Ambulatory Visit (INDEPENDENT_AMBULATORY_CARE_PROVIDER_SITE_OTHER): Payer: Medicaid Other | Admitting: Family Medicine

## 2017-10-01 ENCOUNTER — Ambulatory Visit (INDEPENDENT_AMBULATORY_CARE_PROVIDER_SITE_OTHER): Payer: Medicaid Other

## 2017-10-01 VITALS — BP 109/73 | HR 87 | Temp 97.9°F | Ht 60.0 in | Wt 129.4 lb

## 2017-10-01 DIAGNOSIS — M545 Low back pain: Secondary | ICD-10-CM | POA: Diagnosis not present

## 2017-10-01 DIAGNOSIS — M25551 Pain in right hip: Secondary | ICD-10-CM | POA: Diagnosis not present

## 2017-10-01 DIAGNOSIS — G8929 Other chronic pain: Secondary | ICD-10-CM

## 2017-10-01 DIAGNOSIS — R300 Dysuria: Secondary | ICD-10-CM | POA: Diagnosis not present

## 2017-10-01 LAB — URINALYSIS, COMPLETE
BILIRUBIN UA: NEGATIVE
Glucose, UA: NEGATIVE
KETONES UA: NEGATIVE
NITRITE UA: NEGATIVE
Protein, UA: NEGATIVE
SPEC GRAV UA: 1.025 (ref 1.005–1.030)
Urobilinogen, Ur: 4 mg/dL — ABNORMAL HIGH (ref 0.2–1.0)
pH, UA: 5.5 (ref 5.0–7.5)

## 2017-10-01 LAB — MICROSCOPIC EXAMINATION: Renal Epithel, UA: NONE SEEN /hpf

## 2017-10-01 MED ORDER — FLUCONAZOLE 150 MG PO TABS: ORAL_TABLET | ORAL | 0 refills | Status: DC

## 2017-10-01 MED ORDER — CEPHALEXIN 500 MG PO CAPS: 500.0000 mg | ORAL_CAPSULE | Freq: Three times a day (TID) | ORAL | 0 refills | Status: DC

## 2017-10-01 MED ORDER — DICLOFENAC SODIUM 1 % TD GEL: 2.0000 g | Freq: Four times a day (QID) | TRANSDERMAL | 0 refills | Status: DC

## 2017-10-01 NOTE — Telephone Encounter (Signed)
Sent in new Rx for brand name voltaren

## 2017-10-01 NOTE — Addendum Note (Signed)
Addended by: Eustaquio Maize on: 10/01/2017 12:48 PM   Modules accepted: Orders

## 2017-10-01 NOTE — Progress Notes (Signed)
   HPI  Patient presents today here with dysuria and right hip pain.  Patient also complains of vaginal discharge and concern about STI.  This is been going on for a few weeks.  Dysuria for a few days, no fever, chills, sweats. She has chronic difficulty with bilateral low back pain.  She is due for an x-ray for that.  She will have that while she is here tonight.  Swelling of the right hip Patient has a large swelling over the right greater trochanter which she states has been there for about 3 years.  Said bothering her more for the last few months and now even more over the last  Denies fever, chills, sweats.  She is tolerating food and fluids like usual.  PMH: Smoking status noted ROS: Per HPI  Objective: BP 109/73   Pulse 87   Temp 97.9 F (36.6 C) (Oral)   Ht 5' (1.524 m)   Wt 129 lb 6.4 oz (58.7 kg)   BMI 25.27 kg/m  Gen: NAD, alert, cooperative with exam HEENT: NCAT CV: RRR, good S1/S2, no murmur Resp: CTABL, no wheezes, non-labored Abd: SNTND, BS present, no guarding or organomegaly Ext: No edema, warm Neuro: Alert and oriented  Assessment and plan:  #UTI Treated with Keflex, urine culture pending No signs of urosepsis or complicated UTI   #Right hip pain Patient with persistent right hand pain with large swelling of the right greater trochanter, this could possibly represent a lipoma, however it is quite unusual for lipoma. Refer to orthopedics, appreciate the recommendations  Vaginal discharge Given that I am sending her Keflex tonight I have given her Diflucan. She does have some concerns for STI, however she left a clean catch urine just before her visit.  To test urine for [facility] we really should have a dirty catch.  I recommended that she return to see her PCP in 1 week for evaluation, possibly pelvic exam. She understands and agrees   Orders Placed This Encounter  Procedures  . Urine Culture  . Urinalysis, Complete  . Ambulatory referral to  Orthopedic Surgery    Referral Priority:   Routine    Referral Type:   Surgical    Referral Reason:   Specialty Services Required    Requested Specialty:   Orthopedic Surgery    Number of Visits Requested:   1    Meds ordered this encounter  Medications  . cephALEXin (KEFLEX) 500 MG capsule    Sig: Take 1 capsule (500 mg total) by mouth 3 (three) times daily.    Dispense:  21 capsule    Refill:  0  . fluconazole (DIFLUCAN) 150 MG tablet    Sig: Take one pill and repeat in 3 days    Dispense:  2 tablet    Refill:  0    Laroy Apple, MD Tristan Schroeder [facility] 10/01/2017, 5:15 PM

## 2017-10-01 NOTE — Patient Instructions (Signed)
Great to meet you!  Come back next week to see your PCP for a full evaluation to discuss possible vaginal infections.    Urinary Tract Infection, Adult A urinary tract infection (UTI) is an infection of any part of the urinary tract, which includes the kidneys, ureters, bladder, and urethra. These organs make, store, and get rid of urine in the body. UTI can be a bladder infection (cystitis) or kidney infection (pyelonephritis). What are the causes? This infection may be caused by fungi, viruses, or bacteria. Bacteria are the most common cause of UTIs. This condition can also be caused by repeated incomplete emptying of the bladder during urination. What increases the risk? This condition is more likely to develop if:  You ignore your need to urinate or hold urine for long periods of time.  You do not empty your bladder completely during urination.  You wipe back to front after urinating or having a bowel movement, if you are female.  You are uncircumcised, if you are female.  You are constipated.  You have a urinary catheter that stays in place (indwelling).  You have a weak defense (immune) system.  You have a medical condition that affects your bowels, kidneys, or bladder.  You have diabetes.  You take antibiotic medicines frequently or for long periods of time, and the antibiotics no longer work well against certain types of infections (antibiotic resistance).  You take medicines that irritate your urinary tract.  You are exposed to chemicals that irritate your urinary tract.  You are female.  What are the signs or symptoms? Symptoms of this condition include:  Fever.  Frequent urination or passing small amounts of urine frequently.  Needing to urinate urgently.  Pain or burning with urination.  Urine that smells bad or unusual.  Cloudy urine.  Pain in the lower abdomen or back.  Trouble urinating.  Blood in the urine.  Vomiting or being less hungry than  normal.  Diarrhea or abdominal pain.  Vaginal discharge, if you are female.  How is this diagnosed? This condition is diagnosed with a medical history and physical exam. You will also need to provide a urine sample to test your urine. Other tests may be done, including:  Blood tests.  Sexually transmitted disease (STD) testing.  If you have had more than one UTI, a cystoscopy or imaging studies may be done to determine the cause of the infections. How is this treated? Treatment for this condition often includes a combination of two or more of the following:  Antibiotic medicine.  Other medicines to treat less common causes of UTI.  Over-the-counter medicines to treat pain.  Drinking enough water to stay hydrated.  Follow these instructions at home:  Take over-the-counter and prescription medicines only as told by your health care provider.  If you were prescribed an antibiotic, take it as told by your health care provider. Do not stop taking the antibiotic even if you start to feel better.  Avoid alcohol, caffeine, tea, and carbonated beverages. They can irritate your bladder.  Drink enough fluid to keep your urine clear or pale yellow.  Keep all follow-up visits as told by your health care provider. This is important.  Make sure to: ? Empty your bladder often and completely. Do not hold urine for long periods of time. ? Empty your bladder before and after sex. ? Wipe from front to back after a bowel movement if you are female. Use each tissue one time when you wipe. Contact a  health care provider if:  You have back pain.  You have a fever.  You feel nauseous or vomit.  Your symptoms do not get better after 3 days.  Your symptoms go away and then return. Get help right away if:  You have severe back pain or lower abdominal pain.  You are vomiting and cannot keep down any medicines or water. This information is not intended to replace advice given to you by  your health care provider. Make sure you discuss any questions you have with your health care provider. Document Released: 09/09/2005 Document Revised: 05/13/2016 Document Reviewed: 10/21/2015 Elsevier Interactive Patient Education  2017 [employer].

## 2017-10-06 ENCOUNTER — Ambulatory Visit (INDEPENDENT_AMBULATORY_CARE_PROVIDER_SITE_OTHER): Payer: Medicaid Other | Admitting: Orthopedic Surgery

## 2017-10-06 LAB — URINE CULTURE

## 2017-10-07 ENCOUNTER — Encounter (INDEPENDENT_AMBULATORY_CARE_PROVIDER_SITE_OTHER): Payer: Self-pay | Admitting: Orthopaedic Surgery

## 2017-10-07 ENCOUNTER — Ambulatory Visit (INDEPENDENT_AMBULATORY_CARE_PROVIDER_SITE_OTHER): Payer: Medicaid Other | Admitting: Orthopaedic Surgery

## 2017-10-07 VITALS — BP 119/83 | HR 80 | Ht 60.0 in | Wt 130.0 lb

## 2017-10-07 DIAGNOSIS — M7061 Trochanteric bursitis, right hip: Secondary | ICD-10-CM | POA: Diagnosis not present

## 2017-10-07 MED ORDER — BUPIVACAINE HCL 0.25 % IJ SOLN
2.0000 mL | INTRAMUSCULAR | Status: AC | PRN
  Administered 2017-10-07: 2 mL via INTRA_ARTICULAR

## 2017-10-07 MED ORDER — LIDOCAINE HCL 1 % IJ SOLN
0.5000 mL | INTRAMUSCULAR | Status: AC | PRN
  Administered 2017-10-07: .5 mL

## 2017-10-07 MED ORDER — METHYLPREDNISOLONE ACETATE 40 MG/ML IJ SUSP
40.0000 mg | INTRAMUSCULAR | Status: AC | PRN
  Administered 2017-10-07: 40 mg via INTRA_ARTICULAR

## 2017-10-07 NOTE — Progress Notes (Signed)
Office Visit Note with the consultation requested by Dr. Evette Doffing   Patient: Brenda Spencer           Date of Birth: [birth year]           MRN: [id number] Visit Date: 10/07/2017              Requested by: Eustaquio Maize, MD [place], [place] [id number] PCP: Eustaquio Maize, MD   Assessment & Plan: Visit Diagnoses:  1. Trochanteric bursitis, right hip     Plan: We'll check her back again in 5 weeks. If she has persistent problems with chronic low back pain we may need to consider diagnostic MRI imaging. She tolerated the trochanteric injection well. Thank the opportunity see in consultation.  Follow-Up Instructions: No Follow-up on file.   Orders:  No orders of the defined types were placed in this encounter.  No orders of the defined types were placed in this encounter.     Procedures: Large Joint Inj Date/Time: 10/07/2017 2:11 PM Performed by: Marybelle Killings Authorized by: Rodell Perna C   Location:  Hip Site:  R greater trochanter Needle Size:  22 G Ultrasound Guidance: No   Fluoroscopic Guidance: No   Arthrogram: No   Medications:  0.5 mL lidocaine 1 %; 2 mL bupivacaine 0.25 %; 40 mg methylPREDNISolone acetate 40 MG/ML     Clinical Data: No additional findings.   Subjective: Chief Complaint  Patient presents with  . 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 Been going on for 2 years gradually getting worse she lost from 210 down to 1:30 with gastric sleeve procedure and states it really didn't help her back pain. She has problems with being in the upright position and standing. Her back aches. She denied any associated fever or chills.  Review of Systems Arnold-Chiari malformation previous Chiari decompression. Gastric sleeve procedure history of hypertension and  heart failure with pregnancy. Positive for chromosome 1 deletion syndrome. She has children with the similar chromosome deletion. Prosser S reflux asthma heart disease hypertension.   Objective: Vital Signs: BP 119/83   Pulse 80   Ht 5' (1.524 m)   Wt 130 lb (59 kg)   BMI 25.39 kg/m   Physical Exam  Constitutional: She is oriented to person, place, and time. She appears well-developed.  HENT:  Head: Normocephalic.  Right Ear: External ear normal.  Left Ear: External ear normal.  Eyes: Pupils are equal, round, and reactive to light.  Neck: No tracheal deviation present. No thyromegaly present.  Cardiovascular: Normal rate.   Pulmonary/Chest: Effort normal.  Abdominal: Soft.  Neurological: She is alert and oriented to person, place, and time.  Skin: Skin is warm and dry.  Psychiatric: She has a normal mood and affect. Her behavior is normal.    Ortho Exam patient's it would heal and toe walk. She has tenderness to palpation lumbosacral junction tenderness of the sciatic notch. More prominence of the tissue over the trochanteric bursa on the right than left with tenderness of the greater trochanters right greater than left. A straight leg raising 90 knee and ankle jerk are intact anterior tib EHL is intact limitation in hip range of motion negative Faber test. Distal pulses are intact no pitting edema.  Outside x-rays reviewed which were negative for degenerative  or acute changes.  Specialty Comments:  No specialty comments available.  Imaging: No results found.   PMFS History: Patient Active Problem List   Diagnosis Date Noted  . Low back pain 09/30/2016  . Obesity, Class II, BMI 35-39.9 07/20/2016  . Chiari I malformation ([facility]) 06/09/2016  . Arnold-Chiari malformation, type I ([facility]) 01/20/2016  . Exposure to second hand tobacco smoke 01/19/2016  . Chromosome 1 deletion syndrome   . Parent of child with chromosome abnormality   . History of pregnancy induced hypertension  02/28/2015  . History of congestive heart failure 01/09/2013  . Mild persistent asthma 11/18/2012   Past Medical History:  Diagnosis Date  . Acute post-traumatic headache, not intractable 01/19/2016  . Asthma    ALbuterol inhaler as needed  . CHF (congestive heart failure) ([facility]) 2011   RESOLVED; HAD CARDIAC EVAL   . Chronic back pain    reason unknown  . Congestive heart failure ([facility]) 2011   related to preeclampsia  . Nocturia   . Noncompliance   . Postgastrectomy malabsorption     Family History  Problem Relation Age of Onset  . Learning disabilities Other   . Colon cancer Maternal Grandmother   . Cancer Maternal Grandmother        colon  . Asthma Brother   . Learning disabilities Brother   . Asthma Daughter   . Learning disabilities Daughter   . Seizures Daughter   . Chromosomal disorder Daughter        1q21.1 microdeletion  . Cataracts Daughter        congenital  . Anesthesia problems Neg Hx   . Other Neg Hx     Past Surgical History:  Procedure Laterality Date  . CHOLECYSTECTOMY     teenager  . SLEEVE GASTROPLASTY  01/18/2017  . SUBOCCIPITAL CRANIECTOMY CERVICAL LAMINECTOMY N/A 06/09/2016   Procedure: Chiari Decompression;  Surgeon: Consuella Lose, MD;  Location: [facility];  Service: Neurosurgery;  Laterality: N/A;  posterior/occipital  . TEAR DUCT PROBING  as a child  . TONSILLECTOMY     as a teenager  . TUBAL LIGATION Bilateral 09/10/2015   Procedure: POST PARTUM TUBAL LIGATION;  Surgeon: Mora Bellman, MD;  Location: [facility];  Service: Gynecology;  Laterality: Bilateral;  . WISDOM TOOTH EXTRACTION     as a teenager   Social History   Occupational History  . HOMEMAKER    Social History Main Topics  . Smoking status: Never Smoker  . Smokeless tobacco: Never Used  . Alcohol use No  . Drug use: No  . Sexual activity: Yes    Partners: Male    Birth control/ protection: Surgical

## 2017-10-20 ENCOUNTER — Other Ambulatory Visit: Payer: Self-pay | Admitting: Pediatrics

## 2017-10-21 ENCOUNTER — Telehealth: Payer: Self-pay | Admitting: Pediatrics

## 2017-10-21 NOTE — Telephone Encounter (Signed)
Needs to be seen for MRI of back

## 2017-10-21 NOTE — Telephone Encounter (Signed)
Patient aware and appt made 

## 2017-10-22 ENCOUNTER — Encounter: Payer: Self-pay | Admitting: Pediatrics

## 2017-10-22 ENCOUNTER — Ambulatory Visit: Payer: Medicaid Other | Admitting: Pediatrics

## 2017-10-22 VITALS — BP 107/76 | HR 74 | Temp 98.0°F | Ht 60.0 in | Wt 128.4 lb

## 2017-10-22 DIAGNOSIS — D134 Benign neoplasm of liver: Secondary | ICD-10-CM | POA: Diagnosis not present

## 2017-10-22 DIAGNOSIS — G8929 Other chronic pain: Secondary | ICD-10-CM | POA: Diagnosis not present

## 2017-10-22 DIAGNOSIS — M5442 Lumbago with sciatica, left side: Secondary | ICD-10-CM | POA: Diagnosis not present

## 2017-10-22 MED ORDER — CYCLOBENZAPRINE HCL 10 MG PO TABS: ORAL_TABLET | ORAL | 0 refills | Status: DC

## 2017-10-22 NOTE — Progress Notes (Signed)
Subjective:   Patient ID: Brenda Spencer, female    DOB: 02-Sep-1991, 26 y.o.   MRN: [id number] CC: Back Pain (Mid, lower)  HPI: Brenda Spencer is a 26 y.o. female presenting for Back Pain (Mid, lower)  MRI of abd in 05/2017 with two new lliver lesions from CT scan 01/2017--2 heterogeneous slightly T1 hyperintense lesion; the smaller measuring 10 x 12 mm, the larger measuring 23 x 23 mm. Characteristics most consistent with adenoma. Was recommended to have repeat MRI for evaluation 3-6 from last to assess stability.  Appetite has been ok.  Ongoing back pain for past two years Continues to have daily pain shooting down L leg, shooting pain and back pain bothers her a lot at night and with walking Not able to stand for much more than an hour without needing to sit due to pain No numbness or weakness in L leg Limited in daily activities, can't hold 26 yo, can't pick up laundry basket due to pain Walking and situps "pull" on back, will feel about a minute of shooting pain that is severe, has to wait for pain to resolve before able to resume normal activities Shooting pain down L leg lasts about 10-15 minutes when it comes on, has to sit down when it happens, comes on multiple times in a day  No incontinence No h/o cancer No leg weakness, or tingling  Has been given gentle back stretches and exercises to do at home Tries to do at least three times a week, pt thinks she has been doing it that often for about three months No improvement in back pain with exercises/stretches  Pain started after epidural in Sept 2016, has been persistent since then and getting worse over last few months  Tried flexeril, helps ease the pain slightly, makes her sleepy so she doesn't notice the pain as much Recent gastric sleeve procedure in 01/2017, not able to take NSAIDs anymore to help with pain  Relevant past medical, surgical, family and social history reviewed. Allergies and medications reviewed and  updated. Social History   Tobacco Use  Smoking Status Never Smoker  Smokeless Tobacco Never Used   ROS: Per HPI   Objective:    BP 107/76   Pulse 74   Temp 98 F (36.7 C) (Oral)   Ht 5' (1.524 m)   Wt 128 lb 6.4 oz (58.2 kg)   BMI 25.08 kg/m   Wt Readings from Last 3 Encounters:  10/22/17 128 lb 6.4 oz (58.2 kg)  10/07/17 130 lb (59 kg)  10/01/17 129 lb 6.4 oz (58.7 kg)    Gen: NAD, alert, cooperative with exam, NCAT EYES: EOMI, no conjunctival injection, or no icterus ENT:  TMs pearly gray b/l, OP without erythema LYMPH: no cervical LAD CV: NRRR, normal S1/S2, no murmur, distal pulses 2+ b/l Resp: CTABL, no wheezes, normal WOB Abd: +BS, soft, NTND. no guarding or organomegaly Ext: No edema, warm Neuro: Alert and oriented, strength equal b/l UE and LE, coordination grossly normal MSK: ttp along midline spine, upper lumbar area  Assessment & Plan:  Malachi was seen today for back pain.  Diagnoses and all orders for this visit:  Chronic midline low back pain with left-sided sciatica Flexeril prn Cont gentle exercises -     MR Lumbar Spine Wo Contrast; Future -     cyclobenzaprine (FLEXERIL) 10 MG tablet; TAKE 1 TABLET BY MOUTH TWICE A DAY AS NEEDED FOR MUSCLE SPASMS  Adenoma of liver -  MR Abdomen W Wo Contrast; Future   Follow up plan: Return in about 4 weeks (around 11/19/2017). [name], MD [place]

## 2017-11-11 ENCOUNTER — Ambulatory Visit (INDEPENDENT_AMBULATORY_CARE_PROVIDER_SITE_OTHER): Payer: Medicaid Other | Admitting: Orthopaedic Surgery

## 2017-11-13 ENCOUNTER — Ambulatory Visit: Payer: Medicaid Other | Admitting: Family Medicine

## 2017-11-13 ENCOUNTER — Encounter: Payer: Self-pay | Admitting: Family Medicine

## 2017-11-13 VITALS — BP 117/78 | HR 84 | Temp 97.6°F | Ht 60.0 in | Wt 126.0 lb

## 2017-11-13 DIAGNOSIS — R05 Cough: Secondary | ICD-10-CM

## 2017-11-13 DIAGNOSIS — R059 Cough, unspecified: Secondary | ICD-10-CM

## 2017-11-13 DIAGNOSIS — J329 Chronic sinusitis, unspecified: Secondary | ICD-10-CM

## 2017-11-13 DIAGNOSIS — J40 Bronchitis, not specified as acute or chronic: Secondary | ICD-10-CM | POA: Diagnosis not present

## 2017-11-13 MED ORDER — AMOXICILLIN-POT CLAVULANATE 875-125 MG PO TABS: 1.0000 | ORAL_TABLET | Freq: Two times a day (BID) | ORAL | 0 refills | Status: DC

## 2017-11-13 MED ORDER — GUAIFENESIN-CODEINE 100-10 MG/5ML PO SOLN: 5.0000 mL | Freq: Three times a day (TID) | ORAL | 0 refills | Status: DC | PRN

## 2017-11-13 NOTE — Progress Notes (Signed)
Chief Complaint  Patient presents with  . Cough  . Nasal Congestion  . vaginal bleeding after intercourse    HPI  Patient presents today for Patient presents with upper respiratory congestion. Rhinorrhea that is frequently purulent. There is moderate sore throat. Patient reports coughing frequently as well.  green sputum noted. There is no fever, chills or sweats. The patient reports being a little short of breath.  She has a history of asthma and is afraid it will flareup, but it has not so far.  Onset was 3-5 days ago. Gradually worsening. Tried OTCs without improvement.  PMH: Smoking status noted ROS: Per HPI  Objective: BP 117/78   Pulse 84   Temp 97.6 F (36.4 C) (Oral)   Ht 5' (1.524 m)   Wt 126 lb (57.2 kg)   BMI 24.61 kg/m  Gen: NAD, alert, cooperative with exam HEENT: NCAT, Nasal passages swollen, red TMS RED CV: RRR, good S1/S2, no murmur Resp: Bronchitis changes with scattered wheezes, non-labored Ext: No edema, warm Neuro: Alert and oriented, No gross deficits  Assessment and plan:  1. Sinobronchitis   2. Cough     Meds ordered this encounter  Medications  . guaiFENesin-codeine 100-10 MG/5ML syrup    Sig: Take 5 mLs by mouth 3 (three) times daily as needed for cough.    Dispense:  75 mL    Refill:  0  . amoxicillin-clavulanate (AUGMENTIN) 875-125 MG tablet    Sig: Take 1 tablet by mouth 2 (two) times daily. Take all of this medication    Dispense:  20 tablet    Refill:  0    No orders of the defined types were placed in this encounter.   Follow up as needed.  Claretta Fraise, MD

## 2017-11-26 ENCOUNTER — Ambulatory Visit: Payer: Medicaid Other | Admitting: Pediatrics

## 2017-11-26 ENCOUNTER — Encounter: Payer: Self-pay | Admitting: Pediatrics

## 2017-11-26 VITALS — BP 107/76 | HR 80 | Temp 97.1°F | Ht 60.0 in | Wt 127.4 lb

## 2017-11-26 DIAGNOSIS — R1084 Generalized abdominal pain: Secondary | ICD-10-CM | POA: Diagnosis not present

## 2017-11-26 LAB — URINALYSIS, COMPLETE
BILIRUBIN UA: NEGATIVE
Glucose, UA: NEGATIVE
Nitrite, UA: NEGATIVE
UUROB: 2 mg/dL — AB (ref 0.2–1.0)
pH, UA: 5.5 (ref 5.0–7.5)

## 2017-11-26 LAB — MICROSCOPIC EXAMINATION
RBC, UA: >30 /hpf — AB (ref 0–?)
Renal Epithel, UA: NONE SEEN /hpf

## 2017-11-26 MED ORDER — KETOROLAC TROMETHAMINE 60 MG/2ML IM SOLN
60.0000 mg | Freq: Once | INTRAMUSCULAR | Status: AC
  Administered 2017-11-26: 60 mg via INTRAMUSCULAR

## 2017-11-26 NOTE — Progress Notes (Signed)
  Subjective:   Patient ID: Brenda Spencer, female    DOB: Jan 02, 1991, 26 y.o.   MRN: [id number] CC: Abnormal Menstrual Cycle  HPI: Brenda Spencer is a 26 y.o. female presenting for Abnormal Menstrual Cycle  Periods every month usually Last month had 1 day of bleeding Has tubes tied for birth control Starting 4 days ago has had heavy bleeding with clots, lots of cramping Patient very worried that she has had a miscarriage and wants to be tested Has had a miscarriage in the past and this is what her bleeding was like during the miscarriage Having some low back cramping, abdominal cramping similar to but more significant than her usual period cramping  No fevers, no dysuria, appetite remains fairly good No nausea or vomiting  Relevant past medical, surgical, family and social history reviewed. Allergies and medications reviewed and updated. Social History   Tobacco Use  Smoking Status Never Smoker  Smokeless Tobacco Never Used   ROS: Per HPI   Objective:    BP 107/76   Pulse 80   Temp (!) 97.1 F (36.2 C) (Oral)   Ht 5' (1.524 m)   Wt 127 lb 6.4 oz (57.8 kg)   BMI 24.88 kg/m   Wt Readings from Last 3 Encounters:  11/26/17 127 lb 6.4 oz (57.8 kg)  11/13/17 126 lb (57.2 kg)  10/22/17 128 lb 6.4 oz (58.2 kg)    Gen: NAD, alert, cooperative with exam, NCAT EYES: EOMI, no conjunctival injection, or no icterus ENT:  TMs pearly gray b/l, OP without erythema LYMPH: no cervical LAD CV: NRRR, normal S1/S2, no murmur, distal pulses 2+ b/l Resp: CTABL, no wheezes, normal WOB Abd: +BS, soft, mildly tender throughout with palpation, ND. no guarding or organomegaly Ext: No edema, warm Neuro: Alert and oriented, strength equal b/l UE and LE, coordination grossly normal  Assessment & Plan:  [name] was seen today for abnormal menstrual cycle.  Diagnoses and all orders for this visit:  Generalized abdominal pain We will check below labs, suspect cramping related to regular  cycle given history of tube tie miscarriages very unlikely -     Beta HCG, Quant -     CMP14+EGFR -     CBC with Differential/Platelet -     Urinalysis, Complete -     ketorolac (TORADOL) injection 60 mg  Other orders -     Microscopic Examination   Follow up plan: Return if symptoms worsen or fail to improve. [name], MD [place]

## 2017-11-27 LAB — CBC WITH DIFFERENTIAL/PLATELET
BASOS ABS: 0 10*3/uL (ref 0.0–0.2)
Basos: 0 %
EOS (ABSOLUTE): 0.2 10*3/uL (ref 0.0–0.4)
EOS: 2 %
HEMATOCRIT: 35.6 % (ref 34.0–46.6)
HEMOGLOBIN: 11.6 g/dL (ref 11.1–15.9)
IMMATURE GRANULOCYTES: 0 %
Immature Grans (Abs): 0 10*3/uL (ref 0.0–0.1)
LYMPHS ABS: 1.9 10*3/uL (ref 0.7–3.1)
Lymphs: 24 %
MCH: 28.6 pg (ref 26.6–33.0)
MCHC: 32.6 g/dL (ref 31.5–35.7)
MCV: 88 fL (ref 79–97)
MONOCYTES: 9 %
Monocytes Absolute: 0.7 10*3/uL (ref 0.1–0.9)
NEUTROS PCT: 65 %
Neutrophils Absolute: 5.2 10*3/uL (ref 1.4–7.0)
Platelets: 330 10*3/uL (ref 150–379)
RBC: 4.06 x10E6/uL (ref 3.77–5.28)
RDW: 14 % (ref 12.3–15.4)
WBC: 7.9 10*3/uL (ref 3.4–10.8)

## 2017-11-27 LAB — CMP14+EGFR
A/G RATIO: 2.1 (ref 1.2–2.2)
ALT: 16 IU/L (ref 0–32)
AST: 14 IU/L (ref 0–40)
Albumin: 4.2 g/dL (ref 3.5–5.5)
Alkaline Phosphatase: 84 IU/L (ref 39–117)
BUN/Creatinine Ratio: 24 — ABNORMAL HIGH (ref 9–23)
BUN: 12 mg/dL (ref 6–20)
Bilirubin Total: 0.3 mg/dL (ref 0.0–1.2)
CALCIUM: 8.7 mg/dL (ref 8.7–10.2)
CO2: 23 mmol/L (ref 20–29)
CREATININE: 0.51 mg/dL — AB (ref 0.57–1.00)
Chloride: 105 mmol/L (ref 96–106)
GFR calc Af Amer: 152 mL/min/{1.73_m2} (ref >59)
GFR, EST NON AFRICAN AMERICAN: 132 mL/min/{1.73_m2} (ref >59)
Globulin, Total: 2 g/dL (ref 1.5–4.5)
Glucose: 72 mg/dL (ref 65–99)
POTASSIUM: 4.2 mmol/L (ref 3.5–5.2)
Sodium: 140 mmol/L (ref 134–144)
TOTAL PROTEIN: 6.2 g/dL (ref 6.0–8.5)

## 2017-11-27 LAB — BETA HCG QUANT (REF LAB): hCG Quant: <1 m[IU]/mL

## 2017-11-28 ENCOUNTER — Encounter: Payer: Self-pay | Admitting: Pediatrics

## 2017-12-06 ENCOUNTER — Ambulatory Visit (HOSPITAL_COMMUNITY)
Admission: RE | Admit: 2017-12-06 | Discharge: 2017-12-06 | Disposition: A | Discharge status: Home / Self Care | Payer: Medicaid Other | Source: Ambulatory Visit | Attending: Pediatrics | Admitting: Pediatrics

## 2017-12-06 DIAGNOSIS — K7689 Other specified diseases of liver: Secondary | ICD-10-CM | POA: Diagnosis not present

## 2017-12-06 DIAGNOSIS — D134 Benign neoplasm of liver: Secondary | ICD-10-CM | POA: Diagnosis not present

## 2017-12-06 MED ORDER — GADOXETATE DISODIUM 0.25 MMOL/ML IV SOLN
6.0000 mL | Freq: Once | INTRAVENOUS | Status: AC | PRN
  Administered 2017-12-06: 6 mL via INTRAVENOUS

## 2017-12-10 ENCOUNTER — Telehealth: Payer: Self-pay

## 2017-12-10 NOTE — Telephone Encounter (Signed)
Patient aware and verbalizes understanding. 

## 2017-12-10 NOTE — Telephone Encounter (Signed)
Patient called wanting MRI results from 2/24. Covering PCP please review and advise

## 2017-12-10 NOTE — Telephone Encounter (Signed)
Attempted to contact patient. Mailbox full.

## 2017-12-10 NOTE — Telephone Encounter (Signed)
According to report there were benign lesions in liver. No further testing needed at this time.

## 2018-01-18 ENCOUNTER — Encounter: Payer: Self-pay | Admitting: *Deleted

## 2018-02-02 ENCOUNTER — Ambulatory Visit (INDEPENDENT_AMBULATORY_CARE_PROVIDER_SITE_OTHER): Payer: Medicaid Other

## 2018-02-02 ENCOUNTER — Ambulatory Visit: Payer: Medicaid Other | Admitting: Pediatrics

## 2018-02-02 ENCOUNTER — Encounter: Payer: Self-pay | Admitting: Pediatrics

## 2018-02-02 VITALS — BP 112/78 | HR 74 | Temp 97.3°F | Ht 60.0 in | Wt 122.6 lb

## 2018-02-02 DIAGNOSIS — R634 Abnormal weight loss: Secondary | ICD-10-CM | POA: Diagnosis not present

## 2018-02-02 DIAGNOSIS — M546 Pain in thoracic spine: Secondary | ICD-10-CM | POA: Diagnosis not present

## 2018-02-02 DIAGNOSIS — G8929 Other chronic pain: Secondary | ICD-10-CM | POA: Diagnosis not present

## 2018-02-02 MED ORDER — KETOROLAC TROMETHAMINE 30 MG/ML IJ SOLN
30.0000 mg | Freq: Once | INTRAMUSCULAR | Status: AC
  Administered 2018-02-02 – 2018-02-03 (×2): 30 mg via INTRAMUSCULAR

## 2018-02-02 MED ORDER — CYCLOBENZAPRINE HCL 5 MG PO TABS: ORAL_TABLET | ORAL | 1 refills | Status: DC

## 2018-02-02 NOTE — Patient Instructions (Signed)
Call to set up follow up with Dr Lorin Mercy: [phone]

## 2018-02-02 NOTE — Progress Notes (Signed)
  Subjective:   Patient ID: Brenda Spencer, female    DOB: Jul 14, 1991, 27 y.o.   MRN: [id number] CC: Back Pain (mid, lower)  HPI: Brenda Spencer is a 27 y.o. female presenting for Back Pain (mid, lower)  Ongoing back pain since last visit. Past two months thinks has been worse still. Points to middle of back and both sides of back with where pain is. Pain doesn't go down legs. When walking sometimes will have sharp pains in back, then have L leg give out, has to catch self before falling. Last time happened was two weeks ago. Happens a few times a month. No numbness or tingling in legs. No trouble emptying bladder. Takes flexeril at night with some relief. Not able to take during the day, makes her too tired. Not able to lift anything due to pain that it causes in her back. No fevers, no rash, no redness.   Minimizing NSAIDs due to reent gastric bypass. Toradol shot has helped back pain in the past.   Pt has a lot of excess skin/pannus in abdomen since weight loss surgery. Is very interested in having it removed.  Appetite has been good. Tolerating food well.   Relevant past medical, surgical, family and social history reviewed. Allergies and medications reviewed and updated. Social History   Tobacco Use  Smoking Status Never Smoker  Smokeless Tobacco Never Used   ROS: Per HPI   Objective:    BP 112/78   Pulse 74   Temp (!) 97.3 F (36.3 C) (Oral)   Ht 5' (1.524 m)   Wt 122 lb 9.6 oz (55.6 kg)   BMI 23.94 kg/m   Wt Readings from Last 3 Encounters:  02/02/18 122 lb 9.6 oz (55.6 kg)  11/26/17 127 lb 6.4 oz (57.8 kg)  11/13/17 126 lb (57.2 kg)    Gen: NAD, alert, cooperative with exam, NCAT EYES: EOMI, no conjunctival injection, or no icterus ENT:  OP without erythema CV: NRRR, normal S1/S2, no murmur, distal pulses 2+ b/l Resp: CTABL, no wheezes, normal WOB Abd: +BS, soft, NT, pannus of skin present. no guarding or organomegaly Ext: No edema, warm Neuro: Alert and  oriented, strength equal b/l LE, sensation intact b/l LE, coordination grossly normal MSK: ttp along mid to lower thoracic spine. ttp along b/l paraspinal muscles.  Assessment & Plan:  Brenda Spencer was seen today for back pain.  Diagnoses and all orders for this visit:  Chronic midline thoracic back pain Gentle stretching, tylenol, avoid exacerbating movements. OK to take flexeril as needed but do not drive. Avoid daily ibuprofen use due to gastric bypass surgery. -     DG Thoracic Spine 2 View; Future -     ketorolac (TORADOL) 30 MG/ML injection 30 mg -     cyclobenzaprine (FLEXERIL) 5 MG tablet; TAKE 1 TABLET BY MOUTH TWICE A DAY AS NEEDED FOR MUSCLE SPASMS  Weight loss Pt interested in panniculectomy -     Ambulatory referral to Plastic Surgery   Follow up plan: Return in about 3 months (around 05/02/2018). [name], MD [place]

## 2018-02-24 DIAGNOSIS — E65 Localized adiposity: Secondary | ICD-10-CM | POA: Insufficient documentation

## 2018-03-12 ENCOUNTER — Ambulatory Visit: Payer: Medicaid Other | Admitting: Family Medicine

## 2018-03-12 ENCOUNTER — Encounter: Payer: Self-pay | Admitting: Family Medicine

## 2018-03-12 VITALS — BP 100/70 | HR 112 | Temp 98.2°F | Ht 60.0 in | Wt 120.8 lb

## 2018-03-12 DIAGNOSIS — M62830 Muscle spasm of back: Secondary | ICD-10-CM | POA: Diagnosis not present

## 2018-03-12 DIAGNOSIS — J020 Streptococcal pharyngitis: Secondary | ICD-10-CM | POA: Diagnosis not present

## 2018-03-12 MED ORDER — AMOXICILLIN 500 MG PO CAPS: 500.0000 mg | ORAL_CAPSULE | Freq: Three times a day (TID) | ORAL | 0 refills | Status: DC

## 2018-03-12 MED ORDER — TIZANIDINE HCL 4 MG PO TABS: 4.0000 mg | ORAL_TABLET | Freq: Four times a day (QID) | ORAL | 0 refills | Status: DC | PRN

## 2018-03-12 NOTE — Progress Notes (Signed)
   HPI  Patient presents today sore throat.  Patient states she has had sore throat since Wednesday. Her daughter was hospitalized with asthma and was found to be strep positive while hospitalized, her boyfriend was found to be positive yesterday at another clinic.  She states that she has had sore throat for 4 days, no fever. She does have difficulty swallowing solid foods at times, however is tolerating fluids normally.  Patient also complains of lower thoracic/upper lumbar back spasms.  Cyclobenzaprine has not been very helpful. She requests an injection of some sort.   PMH: Smoking status noted ROS: Per HPI  Objective: BP 100/70   Pulse (!) 112   Temp 98.2 F (36.8 C) (Oral)   Ht 5' (1.524 m)   Wt 120 lb 12.8 oz (54.8 kg)   BMI 23.59 kg/m  Gen: NAD, alert, cooperative with exam HEENT: NCAT, tonsils surgically absent, erythema of the soft palate diffusely, nares clear, TMs normal bilaterally, oropharynx moist CV: RRR, good S1/S2, no murmur Resp: CTABL, no wheezes, non-labored Ext: No edema, warm Neuro: Alert and oriented, No gross deficits MSK Diffuse tenderness bilateral paraspinal muscles in the lower thoracic and upper lumbar area as well as midline  Assessment and plan:  #Strep pharyngitis Treat with amoxicillin Supportive care discussed  #Back spasms Declined injection today given the likelihood of strep pharyngitis Trial of Zanaflex    Orders Placed This Encounter  Procedures  . Rapid Strep Screen (Not at [facility])    Meds ordered this encounter  Medications  . amoxicillin (AMOXIL) 500 MG capsule    Sig: Take 1 capsule (500 mg total) by mouth 3 (three) times daily.    Dispense:  30 capsule    Refill:  0  . tiZANidine (ZANAFLEX) 4 MG tablet    Sig: Take 1 tablet (4 mg total) by mouth every 6 (six) hours as needed for muscle spasms.    Dispense:  30 tablet    Refill:  0    Laroy Apple, MD [facility] 03/12/2018, 10:25  AM

## 2018-03-12 NOTE — Patient Instructions (Signed)

## 2018-03-13 ENCOUNTER — Other Ambulatory Visit: Payer: Self-pay

## 2018-03-13 ENCOUNTER — Emergency Department (HOSPITAL_BASED_OUTPATIENT_CLINIC_OR_DEPARTMENT_OTHER)
Admission: EM | Admit: 2018-03-13 | Discharge: 2018-03-13 | Disposition: A | Discharge status: Home / Self Care | Payer: Medicaid Other | Attending: Emergency Medicine | Admitting: Emergency Medicine

## 2018-03-13 ENCOUNTER — Encounter (HOSPITAL_BASED_OUTPATIENT_CLINIC_OR_DEPARTMENT_OTHER): Payer: Self-pay | Admitting: *Deleted

## 2018-03-13 DIAGNOSIS — Z79899 Other long term (current) drug therapy: Secondary | ICD-10-CM | POA: Diagnosis not present

## 2018-03-13 DIAGNOSIS — M545 Low back pain: Secondary | ICD-10-CM | POA: Diagnosis not present

## 2018-03-13 DIAGNOSIS — J45909 Unspecified asthma, uncomplicated: Secondary | ICD-10-CM | POA: Diagnosis not present

## 2018-03-13 DIAGNOSIS — G8929 Other chronic pain: Secondary | ICD-10-CM

## 2018-03-13 DIAGNOSIS — M549 Dorsalgia, unspecified: Secondary | ICD-10-CM | POA: Diagnosis present

## 2018-03-13 MED ORDER — DEXAMETHASONE 6 MG PO TABS
6.0000 mg | ORAL_TABLET | Freq: Once | ORAL | Status: AC
  Administered 2018-03-13: 6 mg via ORAL
  Filled 2018-03-13: qty 1

## 2018-03-13 MED ORDER — KETOROLAC TROMETHAMINE 30 MG/ML IJ SOLN
30.0000 mg | Freq: Once | INTRAMUSCULAR | Status: AC
  Administered 2018-03-13: 30 mg via INTRAMUSCULAR
  Filled 2018-03-13: qty 1

## 2018-03-13 NOTE — ED Triage Notes (Signed)
Back pain since bending over yesterday. No relief with flexeril

## 2018-03-13 NOTE — ED Provider Notes (Signed)
[facility] EMERGENCY DEPARTMENT Provider Note   CSN: [id number] Arrival date & time: 03/13/18  1730     History   Chief Complaint Chief Complaint  Patient presents with  . Back Pain    HPI Brenda Spencer is a 27 y.o. female with past medical history of chronic back pain, presenting to the ED with acute exacerbation of back pain.  Patient states she bent over forward yesterday to pick up her daughter, and when she went to stand up she felt immediate pull in her mid back.  She states pain is bilateral, worse with movement.  She states she took muscle relaxers and applied heat without significant relief.  She states her PCP normally manages her back pain, however is currently out of town.  She states she finds relief with steroid.  Denies numbness or tingling in extremities, saddle paresthesia, bowel or bladder incontinence, fever.  Denies history of IV drug use or cancer.  The history is provided by the patient.    Past Medical History:  Diagnosis Date  . Acute post-traumatic headache, not intractable 01/19/2016  . Asthma    ALbuterol inhaler as needed  . CHF (congestive heart failure) ([place]) 2011   RESOLVED; HAD CARDIAC EVAL   . Chronic back pain    reason unknown  . Congestive heart failure ([place]) 2011   related to preeclampsia  . Nocturia   . Noncompliance   . Postgastrectomy malabsorption     Patient Active Problem List   Diagnosis Date Noted  . Low back pain 09/30/2016  . Obesity, Class II, BMI 35-39.9 07/20/2016  . Chiari I malformation ([place]) 06/09/2016  . Arnold-Chiari malformation, type I ([place]) 01/20/2016  . Exposure to second hand tobacco smoke 01/19/2016  . Chromosome 1 deletion syndrome   . Parent of child with chromosome abnormality   . History of pregnancy induced hypertension 02/28/2015  . History of congestive heart failure 01/09/2013  . Mild persistent asthma 11/18/2012    Past Surgical History:  Procedure Laterality Date  .  CHOLECYSTECTOMY     teenager  . SLEEVE GASTROPLASTY  01/18/2017  . SUBOCCIPITAL CRANIECTOMY CERVICAL LAMINECTOMY N/A 06/09/2016   Procedure: Chiari Decompression;  Surgeon: Consuella Lose, MD;  Location: [facility];  Service: Neurosurgery;  Laterality: N/A;  posterior/occipital  . TEAR DUCT PROBING  as a child  . TONSILLECTOMY     as a teenager  . TUBAL LIGATION Bilateral 09/10/2015   Procedure: POST PARTUM TUBAL LIGATION;  Surgeon: Mora Bellman, MD;  Location: [facility];  Service: Gynecology;  Laterality: Bilateral;  . WISDOM TOOTH EXTRACTION     as a teenager     OB History    Gravida  5   Para  4   Term  3   Preterm  1   AB  1   Living  4     SAB  1   TAB      Ectopic      Multiple  0   Live Births  4        Obstetric Comments  2011 PIH;IOL; PP CHF-HOSPITALIZED X 1 WEEK         Home Medications    Prior to Admission medications   Medication Sig Start Date End Date Taking? Authorizing Provider  albuterol (PROVENTIL) (2.5 MG/3ML) 0.083% nebulizer solution Take 3 mLs (2.5 mg total) by nebulization every 6 (six) hours as needed for wheezing or shortness of breath. 12/24/16   Eustaquio Maize, MD  amoxicillin (AMOXIL) 500 MG capsule Take 1 capsule (500 mg total) by mouth 3 (three) times daily. 03/12/18   Timmothy Euler, MD  cetirizine (ZYRTEC) 10 MG tablet Take 1 tablet (10 mg total) by mouth daily. 09/23/17   Eustaquio Maize, MD  cyclobenzaprine (FLEXERIL) 5 MG tablet TAKE 1 TABLET BY MOUTH TWICE A DAY AS NEEDED FOR MUSCLE SPASMS 02/02/18   Eustaquio Maize, MD  diclofenac sodium (VOLTAREN) 1 % GEL Apply 2 g topically 4 (four) times daily. 10/01/17   Eustaquio Maize, MD  tiZANidine (ZANAFLEX) 4 MG tablet Take 1 tablet (4 mg total) by mouth every 6 (six) hours as needed for muscle spasms. 03/12/18   Timmothy Euler, MD    Family History Family History  Problem Relation Age of Onset  . Learning disabilities Other   . Colon cancer Maternal Grandmother    . Cancer Maternal Grandmother        colon  . Asthma Brother   . Learning disabilities Brother   . Asthma Daughter   . Learning disabilities Daughter   . Seizures Daughter   . Chromosomal disorder Daughter        1q21.1 microdeletion  . Cataracts Daughter        congenital  . Anesthesia problems Neg Hx   . Other Neg Hx     Social History Social History   Tobacco Use  . Smoking status: Never Smoker  . Smokeless tobacco: Never Used  Substance Use Topics  . Alcohol use: No    Alcohol/week: 0.0 oz  . Drug use: No     Allergies   Metoclopramide and Ondansetron   Review of Systems Review of Systems  Constitutional: Negative for fever.  Gastrointestinal: Negative for abdominal pain.       No bowel incontinence  Genitourinary: Negative for difficulty urinating.  Musculoskeletal: Positive for back pain.  Neurological: Negative for weakness and numbness.     Physical Exam Updated Vital Signs BP 120/75 (BP Location: Left Arm)   Pulse (!) 103   Temp 98.6 F (37 C) (Oral)   Resp 18   Ht 5' (1.524 m)   Wt 54.4 kg (120 lb)   LMP 02/11/2018   SpO2 100%   BMI 23.44 kg/m   Physical Exam  Constitutional: She appears well-developed and well-nourished. No distress.  HENT:  Head: Normocephalic and atraumatic.  Eyes: Conjunctivae are normal.  Cardiovascular: Intact distal pulses.  Pulmonary/Chest: Effort normal.  Musculoskeletal:  No midline spinal tenderness, no bony step-offs or gross deformities.  Bilateral muscular tenderness in upper lumbar/lower thoracic region.  Muscle spasm palpated.  Neurological:  Motor:  Normal tone. 5/5 in lower extremities bilaterally including strong and equal dorsiflexion/plantar flexion Sensory: Pinprick and light touch normal in BLE extremities.  Deep Tendon Reflexes: 2+ and symmetric in the b/l patella Gait: normal gait and balance CV: distal pulses palpable throughout    Psychiatric: She has a normal mood and affect. Her  behavior is normal.  Nursing note and vitals reviewed.    ED Treatments / Results  Labs (all labs ordered are listed, but only abnormal results are displayed) Labs Reviewed - No data to display  EKG None  Radiology No results found.  Procedures Procedures (including critical care time)  Medications Ordered in ED Medications  ketorolac (TORADOL) 30 MG/ML injection 30 mg (30 mg Intramuscular Given 03/13/18 1856)  dexamethasone (DECADRON) tablet 6 mg (6 mg Oral Given 03/13/18 1855)     Initial Impression / Assessment  and Plan / ED Course  I have reviewed the triage vital signs and the nursing notes.  Pertinent labs & imaging results that were available during my care of the patient were reviewed by me and considered in my medical decision making (see chart for details).     Patient with acute exacerbation of chronic back pain, exam consistent with muscle spasm.  No neurological deficits and normal neuro exam.  Patient can walk but states is painful.  No loss of bowel or bladder control.  No concern for cauda equina.  No fever, night sweats, weight loss, h/o cancer, IVDU.  RICE protocol and pain medicine indicated and discussed with patient.   Discussed results, findings, treatment and follow up. Patient advised of return precautions. Patient verbalized understanding and agreed with plan.  Final Clinical Impressions(s) / ED Diagnoses   Final diagnoses:  Acute exacerbation of chronic low back pain    ED Discharge Orders    None       Darrelle Barrell, [place] N, PA-C 03/13/18 1857    Little, Wenda Overland, MD 03/14/18 1745

## 2018-03-13 NOTE — Discharge Instructions (Signed)
Please read instructions below.  You can take ibuprofen or aleve as needed for pain. Take your muscle relaxers as prescribed, as needed for muscle spasm. Apply ice or heat for relief. Follow up with your primary care provider.  Return to ER if new numbness or tingling in your arms or legs, inability to urinate, inability to hold your bowels, or weakness in your extremities.

## 2018-03-14 LAB — RAPID STREP SCREEN (MED CTR MEBANE ONLY): Strep Gp A Ag, IA W/Reflex: POSITIVE — AB

## 2018-03-15 ENCOUNTER — Ambulatory Visit: Payer: Medicaid Other | Admitting: Physician Assistant

## 2018-03-15 ENCOUNTER — Encounter: Payer: Self-pay | Admitting: Physician Assistant

## 2018-03-15 VITALS — BP 104/76 | HR 90 | Temp 98.8°F | Ht 60.0 in | Wt 122.8 lb

## 2018-03-15 DIAGNOSIS — R300 Dysuria: Secondary | ICD-10-CM | POA: Diagnosis not present

## 2018-03-15 DIAGNOSIS — Z202 Contact with and (suspected) exposure to infections with a predominantly sexual mode of transmission: Secondary | ICD-10-CM

## 2018-03-15 DIAGNOSIS — N3001 Acute cystitis with hematuria: Secondary | ICD-10-CM

## 2018-03-15 LAB — MICROSCOPIC EXAMINATION: Renal Epithel, UA: NONE SEEN /hpf

## 2018-03-15 LAB — URINALYSIS, COMPLETE
Bilirubin, UA: NEGATIVE
GLUCOSE, UA: NEGATIVE
Nitrite, UA: NEGATIVE
RBC, UA: NEGATIVE
Urobilinogen, Ur: 1 mg/dL (ref 0.2–1.0)
pH, UA: 6 (ref 5.0–7.5)

## 2018-03-15 MED ORDER — CIPROFLOXACIN HCL 500 MG PO TABS: 500.0000 mg | ORAL_TABLET | Freq: Two times a day (BID) | ORAL | 0 refills | Status: DC

## 2018-03-15 NOTE — Progress Notes (Signed)
BP 104/76   Pulse 90   Temp 98.8 F (37.1 C) (Oral)   Ht 5' (1.524 m)   Wt 122 lb 12.8 oz (55.7 kg)   LMP 02/12/2018   BMI 23.98 kg/m    Subjective:    Patient ID: Brenda Spencer, female    DOB: [birth year], 27 y.o.   MRN: [id number]  HPI: Brenda Spencer is a 27 y.o. female presenting on 03/15/2018 for Urinary Tract Infection and Dysuria  This patient has had several days of dysuria, frequency and nocturia. There is also pain over the bladder in the suprapubic region, no back pain. Denies leakage or hematuria.  Denies fever or chills. No pain in flank area.  Patient reports that she has had unprotected sex and is concerned about exposed to sexually transmitted diseases.  She did have chlamydia last fall.  She is not with the same partner at this time.She denies any other vaginal symptoms at this time.  Past Medical History:  Diagnosis Date  . Acute post-traumatic headache, not intractable 01/19/2016  . Asthma    ALbuterol inhaler as needed  . CHF (congestive heart failure) ([place]) 2011   RESOLVED; HAD CARDIAC EVAL   . Chronic back pain    reason unknown  . Congestive heart failure ([place]) 2011   related to preeclampsia  . Nocturia   . Noncompliance   . Postgastrectomy malabsorption    Relevant past medical, surgical, family and social history reviewed and updated as indicated. Interim medical history since our last visit reviewed. Allergies and medications reviewed and updated. DATA REVIEWED: CHART IN EPIC  Family History reviewed for pertinent findings.  Review of Systems  Constitutional: Negative.   HENT: Negative.   Eyes: Negative.   Respiratory: Negative.   Gastrointestinal: Negative.   Genitourinary: Positive for difficulty urinating, dysuria and urgency. Negative for flank pain.    Allergies as of 03/15/2018      Reactions   Metoclopramide Other (See Comments), Swelling   Tardive dyskinesia   Ondansetron Nausea And Vomiting      Medication List        Accurate as of 03/15/18 12:12 PM. Always use your most recent med list.          albuterol (2.5 MG/3ML) 0.083% nebulizer solution Commonly known as:  PROVENTIL Take 3 mLs (2.5 mg total) by nebulization every 6 (six) hours as needed for wheezing or shortness of breath.   amoxicillin 500 MG capsule Commonly known as:  AMOXIL Take 1 capsule (500 mg total) by mouth 3 (three) times daily.   ciprofloxacin 500 MG tablet Commonly known as:  CIPRO Take 1 tablet (500 mg total) by mouth 2 (two) times daily.   cyclobenzaprine 5 MG tablet Commonly known as:  FLEXERIL TAKE 1 TABLET BY MOUTH TWICE A DAY AS NEEDED FOR MUSCLE SPASMS   diclofenac sodium 1 % Gel Commonly known as:  VOLTAREN Apply 2 g topically 4 (four) times daily.   tiZANidine 4 MG tablet Commonly known as:  ZANAFLEX Take 1 tablet (4 mg total) by mouth every 6 (six) hours as needed for muscle spasms.          Objective:    BP 104/76   Pulse 90   Temp 98.8 F (37.1 C) (Oral)   Ht 5' (1.524 m)   Wt 122 lb 12.8 oz (55.7 kg)   LMP 02/12/2018   BMI 23.98 kg/m   Allergies  Allergen Reactions  . Metoclopramide Other (See Comments) and Swelling  Tardive dyskinesia  . Ondansetron Nausea And Vomiting    Wt Readings from Last 3 Encounters:  03/15/18 122 lb 12.8 oz (55.7 kg)  03/13/18 120 lb (54.4 kg)  03/12/18 120 lb 12.8 oz (54.8 kg)    Physical Exam  Constitutional: She is oriented to person, place, and time. She appears well-developed and well-nourished.  HENT:  Head: Normocephalic and atraumatic.  Eyes: Pupils are equal, round, and reactive to light. Conjunctivae are normal.  Cardiovascular: Normal rate, regular rhythm, normal heart sounds and intact distal pulses.  Pulmonary/Chest: Effort normal and breath sounds normal.  Abdominal: Soft. Bowel sounds are normal. She exhibits no distension and no mass. There is tenderness in the suprapubic area. There is no rebound, no guarding and no CVA tenderness.    Neurological: She is alert and oriented to person, place, and time. She has normal reflexes.  Skin: Skin is warm and dry. No rash noted.  Psychiatric: She has a normal mood and affect. Her behavior is normal. Judgment and thought content normal.    Results for orders placed or performed in visit on 03/15/18  Microscopic Examination  Result Value Ref Range   WBC, UA 11-30 (A) 0 - 5 /hpf   RBC, UA 0-2 0 - 2 /hpf   Epithelial Cells (non renal) 0-10 0 - 10 /hpf   Renal Epithel, UA None seen None seen /hpf   Mucus, UA Present Not Estab.   Bacteria, UA Few None seen/Few  Urinalysis, Complete  Result Value Ref Range   Specific Gravity, UA >1.030 (H) 1.005 - 1.030   pH, UA 6.0 5.0 - 7.5   Color, UA Yellow Yellow   Appearance Ur Clear Clear   Leukocytes, UA 1+ (A) Negative   Protein, UA Trace (A) Negative/Trace   Glucose, UA Negative Negative   Ketones, UA Trace (A) Negative   RBC, UA Negative Negative   Bilirubin, UA Negative Negative   Urobilinogen, Ur 1.0 0.2 - 1.0 mg/dL   Nitrite, UA Negative Negative   Microscopic Examination See below:       Assessment & Plan:   1. Dysuria - Urine Culture - Urinalysis, Complete - Microscopic Examination  2. Sexually transmitted disease exposure - STD Screen (8) - GC/Chlamydia Probe Amp([facility])  3. Acute cystitis with hematuria - ciprofloxacin (CIPRO) 500 MG tablet; Take 1 tablet (500 mg total) by mouth 2 (two) times daily.  Dispense: 20 tablet; Refill: 0   Continue all other maintenance medications as listed above.  Follow up plan: No follow-ups on file.  Educational handout given for [place] PA-C [facility] [address]  [place], [place] [address] [phone]   03/15/2018, 12:12 PM

## 2018-03-16 LAB — URINE CULTURE

## 2018-03-16 LAB — STD SCREEN (8)
HEP A IGM: NEGATIVE
HIV Screen 4th Generation wRfx: NONREACTIVE
HSV 1 GLYCOPROTEIN G AB, IGG: 48.3 {index} — AB (ref 0.00–0.90)
Hep B C IgM: NEGATIVE
Hepatitis B Surface Ag: NEGATIVE
RPR Ser Ql: NONREACTIVE

## 2018-03-29 ENCOUNTER — Other Ambulatory Visit: Payer: Self-pay

## 2018-03-29 ENCOUNTER — Encounter (HOSPITAL_BASED_OUTPATIENT_CLINIC_OR_DEPARTMENT_OTHER): Payer: Self-pay | Admitting: *Deleted

## 2018-03-29 ENCOUNTER — Emergency Department (HOSPITAL_BASED_OUTPATIENT_CLINIC_OR_DEPARTMENT_OTHER)
Admission: EM | Admit: 2018-03-29 | Discharge: 2018-03-29 | Disposition: A | Discharge status: Home / Self Care | Payer: Medicaid Other | Attending: Emergency Medicine | Admitting: Emergency Medicine

## 2018-03-29 DIAGNOSIS — R109 Unspecified abdominal pain: Secondary | ICD-10-CM | POA: Diagnosis present

## 2018-03-29 DIAGNOSIS — J45909 Unspecified asthma, uncomplicated: Secondary | ICD-10-CM | POA: Diagnosis not present

## 2018-03-29 DIAGNOSIS — M545 Low back pain: Secondary | ICD-10-CM | POA: Diagnosis not present

## 2018-03-29 DIAGNOSIS — I509 Heart failure, unspecified: Secondary | ICD-10-CM | POA: Insufficient documentation

## 2018-03-29 DIAGNOSIS — R102 Pelvic and perineal pain: Secondary | ICD-10-CM | POA: Insufficient documentation

## 2018-03-29 DIAGNOSIS — R309 Painful micturition, unspecified: Secondary | ICD-10-CM | POA: Diagnosis not present

## 2018-03-29 LAB — URINALYSIS, ROUTINE W REFLEX MICROSCOPIC
BILIRUBIN URINE: NEGATIVE
GLUCOSE, UA: NEGATIVE mg/dL
HGB URINE DIPSTICK: NEGATIVE
KETONES UR: 15 mg/dL — AB
Leukocytes, UA: NEGATIVE
NITRITE: NEGATIVE
PH: 5.5 (ref 5.0–8.0)
Protein, ur: NEGATIVE mg/dL

## 2018-03-29 LAB — PREGNANCY, URINE: Preg Test, Ur: NEGATIVE

## 2018-03-29 MED ORDER — CEFTRIAXONE SODIUM 250 MG IJ SOLR
250.0000 mg | Freq: Once | INTRAMUSCULAR | Status: AC
  Administered 2018-03-29: 250 mg via INTRAMUSCULAR
  Filled 2018-03-29: qty 250

## 2018-03-29 MED ORDER — KETOROLAC TROMETHAMINE 60 MG/2ML IM SOLN
60.0000 mg | Freq: Once | INTRAMUSCULAR | Status: AC
  Administered 2018-03-29: 60 mg via INTRAMUSCULAR
  Filled 2018-03-29: qty 2

## 2018-03-29 MED ORDER — AZITHROMYCIN 250 MG PO TABS
1000.0000 mg | ORAL_TABLET | Freq: Once | ORAL | Status: AC
  Administered 2018-03-29: 1000 mg via ORAL
  Filled 2018-03-29: qty 4

## 2018-03-29 NOTE — ED Triage Notes (Signed)
Pt c/o painful urination and back pain x 2 week, DX UTI x 1 week ago , no inporvement

## 2018-03-29 NOTE — ED Provider Notes (Signed)
[facility] EMERGENCY DEPARTMENT Provider Note   CSN: [id number] Arrival date & time: 03/29/18  2042     History   Chief Complaint Chief Complaint  Patient presents with  . Abdominal Pain    HPI Brenda Spencer is a 27 y.o. female.  HPI Patient reports that she has been having lower back pain and pain with urination for about 2 weeks.  She has been treated for urinary tract infection.  Ports she took Keflex.  She states she still gets burning when she urinates and has been having lower cramping abdominal pain with aching in the back.  No fevers no nausea no vomiting. Past Medical History:  Diagnosis Date  . Acute post-traumatic headache, not intractable 01/19/2016  . Asthma    ALbuterol inhaler as needed  . CHF (congestive heart failure) ([place]) 2011   RESOLVED; HAD CARDIAC EVAL   . Chronic back pain    reason unknown  . Congestive heart failure ([place]) 2011   related to preeclampsia  . Nocturia   . Noncompliance   . Postgastrectomy malabsorption     Patient Active Problem List   Diagnosis Date Noted  . Low back pain 09/30/2016  . Obesity, Class II, BMI 35-39.9 07/20/2016  . Chiari I malformation ([place]) 06/09/2016  . Arnold-Chiari malformation, type I ([place]) 01/20/2016  . Exposure to second hand tobacco smoke 01/19/2016  . Chromosome 1 deletion syndrome   . Parent of child with chromosome abnormality   . History of pregnancy induced hypertension 02/28/2015  . History of congestive heart failure 01/09/2013  . Mild persistent asthma 11/18/2012    Past Surgical History:  Procedure Laterality Date  . CHOLECYSTECTOMY     teenager  . SLEEVE GASTROPLASTY  01/18/2017  . SUBOCCIPITAL CRANIECTOMY CERVICAL LAMINECTOMY N/A 06/09/2016   Procedure: Chiari Decompression;  Surgeon: Consuella Lose, MD;  Location: [facility];  Service: Neurosurgery;  Laterality: N/A;  posterior/occipital  . TEAR DUCT PROBING  as a child  . TONSILLECTOMY     as a teenager  . TUBAL  LIGATION Bilateral 09/10/2015   Procedure: POST PARTUM TUBAL LIGATION;  Surgeon: Mora Bellman, MD;  Location: [facility];  Service: Gynecology;  Laterality: Bilateral;  . WISDOM TOOTH EXTRACTION     as a teenager     OB History    Gravida  5   Para  4   Term  3   Preterm  1   AB  1   Living  4     SAB  1   TAB      Ectopic      Multiple  0   Live Births  4        Obstetric Comments  2011 PIH;IOL; PP CHF-HOSPITALIZED X 1 WEEK         Home Medications    Prior to Admission medications   Medication Sig Start Date End Date Taking? Authorizing Provider  albuterol (PROVENTIL) (2.5 MG/3ML) 0.083% nebulizer solution Take 3 mLs (2.5 mg total) by nebulization every 6 (six) hours as needed for wheezing or shortness of breath. 12/24/16   Eustaquio Maize, MD  amoxicillin (AMOXIL) 500 MG capsule Take 1 capsule (500 mg total) by mouth 3 (three) times daily. 03/12/18   Timmothy Euler, MD  ciprofloxacin (CIPRO) 500 MG tablet Take 1 tablet (500 mg total) by mouth 2 (two) times daily. 03/15/18   Terald Sleeper, PA-C  cyclobenzaprine (FLEXERIL) 5 MG tablet TAKE 1 TABLET BY MOUTH TWICE A  DAY AS NEEDED FOR MUSCLE SPASMS 02/02/18   Eustaquio Maize, MD  diclofenac sodium (VOLTAREN) 1 % GEL Apply 2 g topically 4 (four) times daily. 10/01/17   Eustaquio Maize, MD  tiZANidine (ZANAFLEX) 4 MG tablet Take 1 tablet (4 mg total) by mouth every 6 (six) hours as needed for muscle spasms. 03/12/18   Timmothy Euler, MD    Family History Family History  Problem Relation Age of Onset  . Learning disabilities Other   . Colon cancer Maternal Grandmother   . Cancer Maternal Grandmother        colon  . Asthma Brother   . Learning disabilities Brother   . Asthma Daughter   . Learning disabilities Daughter   . Seizures Daughter   . Chromosomal disorder Daughter        1q21.1 microdeletion  . Cataracts Daughter        congenital  . Anesthesia problems Neg Hx   . Other Neg Hx     Social  History Social History   Tobacco Use  . Smoking status: Never Smoker  . Smokeless tobacco: Never Used  Substance Use Topics  . Alcohol use: No    Alcohol/week: 0.0 oz  . Drug use: No     Allergies   Metoclopramide and Ondansetron   Review of Systems Review of Systems 10 Systems reviewed and are negative for acute change except as noted in the HPI.   Physical Exam Updated Vital Signs BP 98/78   Pulse 99   Temp 98.1 F (36.7 C)   Resp 16   Ht '5\' 2"'$  (1.575 m)   Wt 55.3 kg (122 lb)   LMP 03/15/2018   SpO2 100%   BMI 22.31 kg/m   Physical Exam  Constitutional: She is oriented to person, place, and time.  Patient is clinically well in appearance.  She is nontoxic and alert.  No acute distress.  HENT:  Head: Normocephalic and atraumatic.  Eyes: EOM are normal.  Cardiovascular: Normal rate, regular rhythm and normal heart sounds.  Pulmonary/Chest: Effort normal and breath sounds normal.  Abdominal: Soft.  Abdomen soft.  No guarding.  Patient has mild to moderate suprapubic tenderness and left lower quadrant tenderness.  No palpable mass.  Genitourinary:  Genitourinary Comments: Normal external female genitalia.  No suspicious appearing lesions.  Speculum shows very small amount of yellow discharge from the cervix.  Bimanual is positive for uterine tenderness.  The adnexa are nontender.  Patient does report that that seems to reproduce her pain.  Musculoskeletal: Normal range of motion. She exhibits no edema or tenderness.  Neurological: She is alert and oriented to person, place, and time. She exhibits normal muscle tone. Coordination normal.  Skin: Skin is warm and dry.  Psychiatric: She has a normal mood and affect.     ED Treatments / Results  Labs (all labs ordered are listed, but only abnormal results are displayed) Labs Reviewed  URINALYSIS, ROUTINE W REFLEX MICROSCOPIC - Abnormal; Notable for the following components:      Result Value   APPearance CLOUDY  (*)    Specific Gravity, Urine >1.030 (*)    Ketones, ur 15 (*)    All other components within normal limits  WET PREP, GENITAL  PREGNANCY, URINE  GC/CHLAMYDIA PROBE AMP ([facility]) NOT AT [facility]    EKG None  Radiology No results found.  Procedures Procedures (including critical care time)  Medications Ordered in ED Medications  cefTRIAXone (ROCEPHIN) injection 250 mg (has  no administration in time range)  azithromycin (ZITHROMAX) tablet 1,000 mg (has no administration in time range)  ketorolac (TORADOL) injection 60 mg (60 mg Intramuscular Given 03/29/18 2153)     Initial Impression / Assessment and Plan / ED Course  I have reviewed the triage vital signs and the nursing notes.  Pertinent labs & imaging results that were available during my care of the patient were reviewed by me and considered in my medical decision making (see chart for details).      Final Clinical Impressions(s) / ED Diagnoses   Final diagnoses:  Pelvic pain in female  Patient has been having lower back and pelvic pain for several weeks that has not responded to treatment for UTI.  View of the culture shows it was negative.  She is clinically well in appearance.  She does not have surgical abdomen.  On pelvic examination she does have uterine tenderness and this seems to localize her pain.  This time plan will be to treat empirically for possible cervicitis or pelvic infection.  Rocephin IM and Zithromax administered in the emergency department.  Is aware that she needs close follow-up with her GYN and PCP for further evaluation and monitoring of resolution of symptoms with treatment.  ED Discharge Orders    None       Charlesetta Shanks, MD 03/29/18 2256

## 2018-03-29 NOTE — Discharge Instructions (Signed)
1.  You have been given a dose of Rocephin and Zithromax in the emergency department to treat for possible pelvic infection. 2.  Follow-up with your gynecologist in your family doctor.

## 2018-03-30 LAB — WET PREP, GENITAL
Sperm: NONE SEEN
TRICH WET PREP: NONE SEEN
Yeast Wet Prep HPF POC: NONE SEEN

## 2018-03-30 LAB — GC/CHLAMYDIA PROBE AMP (~~LOC~~) NOT AT ARMC
CHLAMYDIA, DNA PROBE: NEGATIVE
NEISSERIA GONORRHEA: NEGATIVE

## 2018-04-19 ENCOUNTER — Ambulatory Visit: Payer: Medicaid Other | Admitting: Family Medicine

## 2018-04-22 ENCOUNTER — Encounter: Payer: Self-pay | Admitting: Pediatrics

## 2018-05-06 ENCOUNTER — Ambulatory Visit: Payer: Medicaid Other | Admitting: Pediatrics

## 2018-05-06 ENCOUNTER — Encounter: Payer: Self-pay | Admitting: Pediatrics

## 2018-05-06 VITALS — BP 108/78 | HR 76 | Temp 99.6°F | Ht 62.0 in | Wt 121.8 lb

## 2018-05-06 DIAGNOSIS — F410 Panic disorder [episodic paroxysmal anxiety] without agoraphobia: Secondary | ICD-10-CM | POA: Diagnosis not present

## 2018-05-06 DIAGNOSIS — F339 Major depressive disorder, recurrent, unspecified: Secondary | ICD-10-CM | POA: Diagnosis not present

## 2018-05-06 DIAGNOSIS — F419 Anxiety disorder, unspecified: Secondary | ICD-10-CM

## 2018-05-06 MED ORDER — PROPRANOLOL HCL 10 MG PO TABS: 10.0000 mg | ORAL_TABLET | Freq: Three times a day (TID) | ORAL | 2 refills | Status: DC | PRN

## 2018-05-06 MED ORDER — SERTRALINE HCL 50 MG PO TABS: ORAL_TABLET | ORAL | 1 refills | Status: DC

## 2018-05-06 NOTE — Progress Notes (Signed)
Subjective:   Patient ID: Brenda Spencer, female    DOB: 1991/08/29, 27 y.o.   MRN: [id number] CC: Anxiety  HPI: Brenda Spencer is a 27 y.o. female   Her boyfriend is noticed worsening mood over the last 2 months.  The last couple weeks she has been fighting more with the father of her children.  She has 3 children who have special needs and has been stressful.  She has good support in their pediatrician.  For last 1 to 2 weeks she is woken up several times in the middle the night feeling very anxious, so she does not know why.  It is happen during the day as well.  Will feel her heart racing and feel short of breath.  Having regular periods.  Status post tubal ligation.  She has a history of cutting when she was a teenager.  Was depressed at that time, never saw anybody for it or was put on medicine.  She did attempt to take her life she says a couple times but never followed up with the doctor.  Her mom was aware of this.  She has not had any recent thoughts of self-harm or not wanting to be here in the last few years.  GAD 7 : Generalized Anxiety Score 05/06/2018  Nervous, Anxious, on Edge 3  Control/stop worrying 3  Worry too much - different things 3  Trouble relaxing 3  Restless 0  Easily annoyed or irritable 0  Afraid - awful might happen 3  Total GAD 7 Score 15  Anxiety Difficulty Somewhat difficult   Depression screen [facility] 2/9 05/06/2018 03/15/2018 03/12/2018 02/02/2018 11/26/2017  Decreased Interest 1 0 0 0 0  Down, Depressed, Hopeless 1 0 0 0 0  PHQ - 2 Score 2 0 0 0 0  Altered sleeping 3 - - - -  Tired, decreased energy 0 - - - -  Change in appetite 0 - - - -  Feeling bad or failure about yourself  1 - - - -  Trouble concentrating 0 - - - -  Moving slowly or fidgety/restless 0 - - - -  Suicidal thoughts 0 - - - -  PHQ-9 Score 6 - - - -  Difficult doing work/chores Somewhat difficult - - - -  Some recent data might be hidden     Relevant past medical, surgical,  family and social history reviewed. Allergies and medications reviewed and updated. Social History   Tobacco Use  Smoking Status Never Smoker  Smokeless Tobacco Never Used   ROS: Per HPI   Objective:    BP 108/78   Pulse 76   Temp 99.6 F (37.6 C) (Oral)   Ht 5\' 2"  (1.575 m)   Wt 121 lb 12.8 oz (55.2 kg)   BMI 22.28 kg/m   Wt Readings from Last 3 Encounters:  05/06/18 121 lb 12.8 oz (55.2 kg)  03/29/18 122 lb (55.3 kg)  03/15/18 122 lb 12.8 oz (55.7 kg)    Gen: NAD, alert, cooperative with exam, NCAT EYES: EOMI, no conjunctival injection, or no icterus ENT:  TMs pearly gray b/l, OP without erythema LYMPH: no cervical LAD CV: NRRR, normal S1/S2, no murmur, distal pulses 2+ b/l Resp: CTABL, no wheezes, normal WOB Abd: +BS, soft, NTND. no guarding or organomegaly Ext: No edema, warm Neuro: Alert and oriented Psych: Normal affect  Assessment & Plan:  Chrysa was seen today for anxiety.  Diagnoses and all orders for this visit:  Anxiety Discussed options,  will start SSRI.  Feels safe at home.  Gave information for counselors in the area.  May be able to start virtual behavioral health program once her insurance is taken through them in the future. -     sertraline (ZOLOFT) 50 MG tablet; Take half tab for 8 days then take full tab once a day in the morning. -     CBC with Differential -     Ferritin -     Basic Metabolic Panel -     TSH  Depression, recurrent (HCC) -     sertraline (ZOLOFT) 50 MG tablet; Take half tab for 8 days then take full tab once a day in the morning.  Panic attacks Use below as needed for panic attacks. -     propranolol (INDERAL) 10 MG tablet; Take 1 tablet (10 mg total) by mouth 3 (three) times daily as needed.  Follow up plan: Return in about 1 month (around 06/03/2018).  Return precautions discussed. [name], MD [place]

## 2018-05-06 NOTE — Patient Instructions (Addendum)
Parent Research scientist (physical sciences): Hannah [phone]  [place] [employer] [place] [place]. [phone]  Parents as Teachers Program in [place]: [phone] If you have any trouble let me know, I can also sign you up if you want.   [facility]  [address] [place], [place] [address] (ph) [phone]  [address], [place], [place] [address] (ph) [phone]  Your provider wants you to schedule an appointment with a Psychologist/Psychiatrist. The following list of offices requires the patient to call and make their own appointment, as there is information they need that only you can provide. Please feel free to choose form the following providers:  [facility] in [place]  [place]  [phone] [place], [place]  (Scheduled through [place]) Must call and do an interview for appointment. Sees Children / Accepts Medicaid  [facility]  [phone] [place], [place] for Autism but does not treat it Sees Children / Accepts Medicaid  [facility]    [phone] [address]   [place], [place] Medication management, substance abuse, bipolar, grief, family, marriage, OCD, anxiety, PTSD Sees children / Accepts Medicaid  [facility]    [phone] [address] [place], [place] Sees children / Accepts Medicaid  Dr Lorenza Evangelist     [phone] [address] [place], [place]  Sees ADD & ADHD for treatment Accepts [facility]  [facility]  [phone] [address] [place], [place] for Autism Accepts Medicaid  [facility]   [phone] [address] [place], [place] children as young as 43 years  old Accepts [facility]     [phone]    [place], [place] [address] Sees children Accepts Medicaid

## 2018-05-07 LAB — CBC WITH DIFFERENTIAL/PLATELET
BASOS: 0 %
Basophils Absolute: 0 10*3/uL (ref 0.0–0.2)
EOS (ABSOLUTE): 0.1 10*3/uL (ref 0.0–0.4)
EOS: 2 %
HEMATOCRIT: 34.2 % (ref 34.0–46.6)
HEMOGLOBIN: 10.4 g/dL — AB (ref 11.1–15.9)
IMMATURE GRANS (ABS): 0 10*3/uL (ref 0.0–0.1)
IMMATURE GRANULOCYTES: 0 %
Lymphocytes Absolute: 2 10*3/uL (ref 0.7–3.1)
Lymphs: 35 %
MCH: 25.4 pg — ABNORMAL LOW (ref 26.6–33.0)
MCHC: 30.4 g/dL — ABNORMAL LOW (ref 31.5–35.7)
MCV: 83 fL (ref 79–97)
MONOCYTES: 9 %
Monocytes Absolute: 0.6 10*3/uL (ref 0.1–0.9)
Neutrophils Absolute: 3.2 10*3/uL (ref 1.4–7.0)
Neutrophils: 54 %
Platelets: 332 10*3/uL (ref 150–450)
RBC: 4.1 x10E6/uL (ref 3.77–5.28)
RDW: 15.6 % — ABNORMAL HIGH (ref 12.3–15.4)
WBC: 5.9 10*3/uL (ref 3.4–10.8)

## 2018-05-07 LAB — BASIC METABOLIC PANEL
BUN/Creatinine Ratio: 14 (ref 9–23)
BUN: 8 mg/dL (ref 6–20)
CALCIUM: 8.5 mg/dL — AB (ref 8.7–10.2)
CO2: 23 mmol/L (ref 20–29)
CREATININE: 0.57 mg/dL (ref 0.57–1.00)
Chloride: 107 mmol/L — ABNORMAL HIGH (ref 96–106)
GFR calc Af Amer: 146 mL/min/{1.73_m2} (ref >59)
GFR, EST NON AFRICAN AMERICAN: 127 mL/min/{1.73_m2} (ref >59)
GLUCOSE: 80 mg/dL (ref 65–99)
Potassium: 4.6 mmol/L (ref 3.5–5.2)
Sodium: 141 mmol/L (ref 134–144)

## 2018-05-07 LAB — TSH: TSH: 0.683 u[IU]/mL (ref 0.450–4.500)

## 2018-05-07 LAB — FERRITIN: Ferritin: 10 ng/mL — ABNORMAL LOW (ref 15–150)

## 2018-06-06 ENCOUNTER — Ambulatory Visit: Payer: Medicaid Other | Admitting: Pediatrics

## 2018-06-06 ENCOUNTER — Telehealth: Payer: Self-pay

## 2018-06-06 ENCOUNTER — Encounter: Payer: Self-pay | Admitting: Pediatrics

## 2018-06-06 ENCOUNTER — Telehealth (HOSPITAL_COMMUNITY): Payer: Self-pay | Admitting: Psychiatry

## 2018-06-06 ENCOUNTER — Emergency Department (HOSPITAL_COMMUNITY)
Admission: EM | Admit: 2018-06-06 | Discharge: 2018-06-07 | Disposition: A | Discharge status: Other Acute Inpatient Hospital | Payer: Medicaid Other | Attending: Emergency Medicine | Admitting: Emergency Medicine

## 2018-06-06 ENCOUNTER — Encounter (HOSPITAL_COMMUNITY): Payer: Self-pay | Admitting: Emergency Medicine

## 2018-06-06 ENCOUNTER — Other Ambulatory Visit: Payer: Self-pay

## 2018-06-06 VITALS — BP 117/83 | HR 77 | Temp 98.0°F | Ht 62.0 in | Wt 120.4 lb

## 2018-06-06 DIAGNOSIS — F32A Depression, unspecified: Secondary | ICD-10-CM

## 2018-06-06 DIAGNOSIS — F419 Anxiety disorder, unspecified: Secondary | ICD-10-CM

## 2018-06-06 DIAGNOSIS — T1491XA Suicide attempt, initial encounter: Secondary | ICD-10-CM | POA: Diagnosis not present

## 2018-06-06 DIAGNOSIS — Z046 Encounter for general psychiatric examination, requested by authority: Secondary | ICD-10-CM | POA: Diagnosis present

## 2018-06-06 DIAGNOSIS — F329 Major depressive disorder, single episode, unspecified: Secondary | ICD-10-CM | POA: Diagnosis not present

## 2018-06-06 DIAGNOSIS — R45851 Suicidal ideations: Secondary | ICD-10-CM | POA: Insufficient documentation

## 2018-06-06 DIAGNOSIS — F339 Major depressive disorder, recurrent, unspecified: Secondary | ICD-10-CM

## 2018-06-06 DIAGNOSIS — D649 Anemia, unspecified: Secondary | ICD-10-CM | POA: Diagnosis not present

## 2018-06-06 DIAGNOSIS — F332 Major depressive disorder, recurrent severe without psychotic features: Secondary | ICD-10-CM | POA: Diagnosis not present

## 2018-06-06 DIAGNOSIS — Z79899 Other long term (current) drug therapy: Secondary | ICD-10-CM | POA: Diagnosis not present

## 2018-06-06 DIAGNOSIS — J45909 Unspecified asthma, uncomplicated: Secondary | ICD-10-CM | POA: Diagnosis not present

## 2018-06-06 LAB — COMPREHENSIVE METABOLIC PANEL
ALBUMIN: 3.9 g/dL (ref 3.5–5.0)
ALT: 15 U/L (ref 14–54)
AST: 18 U/L (ref 15–41)
Alkaline Phosphatase: 70 U/L (ref 38–126)
Anion gap: 7 (ref 5–15)
BUN: 10 mg/dL (ref 6–20)
CALCIUM: 8.4 mg/dL — AB (ref 8.9–10.3)
CHLORIDE: 108 mmol/L (ref 101–111)
CO2: 25 mmol/L (ref 22–32)
CREATININE: 0.64 mg/dL (ref 0.44–1.00)
GFR calc Af Amer: >60 mL/min (ref >60)
GFR calc non Af Amer: >60 mL/min (ref >60)
GLUCOSE: 82 mg/dL (ref 65–99)
Potassium: 3.4 mmol/L — ABNORMAL LOW (ref 3.5–5.1)
SODIUM: 140 mmol/L (ref 135–145)
Total Bilirubin: 0.8 mg/dL (ref 0.3–1.2)
Total Protein: 6.8 g/dL (ref 6.5–8.1)

## 2018-06-06 LAB — CBC
HEMATOCRIT: 32.1 % — AB (ref 36.0–46.0)
Hemoglobin: 9.9 g/dL — ABNORMAL LOW (ref 12.0–15.0)
MCH: 25.4 pg — AB (ref 26.0–34.0)
MCHC: 30.8 g/dL (ref 30.0–36.0)
MCV: 82.3 fL (ref 78.0–100.0)
Platelets: 326 10*3/uL (ref 150–400)
RBC: 3.9 MIL/uL (ref 3.87–5.11)
RDW: 15.4 % (ref 11.5–15.5)
WBC: 6.6 10*3/uL (ref 4.0–10.5)

## 2018-06-06 LAB — ACETAMINOPHEN LEVEL: Acetaminophen (Tylenol), Serum: <10 ug/mL — ABNORMAL LOW (ref 10–30)

## 2018-06-06 LAB — RAPID URINE DRUG SCREEN, HOSP PERFORMED
AMPHETAMINES: NOT DETECTED
BENZODIAZEPINES: NOT DETECTED
Cocaine: NOT DETECTED
Opiates: NOT DETECTED
Tetrahydrocannabinol: POSITIVE — AB

## 2018-06-06 LAB — SALICYLATE LEVEL

## 2018-06-06 LAB — ETHANOL: Alcohol, Ethyl (B): <10 mg/dL (ref <10)

## 2018-06-06 LAB — PREGNANCY, URINE: PREG TEST UR: NEGATIVE

## 2018-06-06 MED ORDER — CYCLOBENZAPRINE HCL 10 MG PO TABS
10.0000 mg | ORAL_TABLET | Freq: Once | ORAL | Status: AC
  Administered 2018-06-06: 10 mg via ORAL
  Filled 2018-06-06: qty 1

## 2018-06-06 NOTE — ED Triage Notes (Signed)
PT was seen at [facility] today for increased anxiety and two suicide attempts over the past 2 days. Per the notes pt has a bed at [facility] this evening prompting medical clearance by ED physician. PT admits to marijuana use but denies any alcohol or other illegal drugs.

## 2018-06-06 NOTE — BHH Counselor (Addendum)
Reviewed case with NP Lindon Romp. NP recommends IP admission.   Per [facility] Inocencio Homes, pt accepted to [facility] on 06/07/18 after morning shift change. Arrival to be after 9 am..  Assigned room 403-2. Attending [place]. Call report to [phone].   Spoke with nurse Jenny Reichmann at [facility] to advise.
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
## 2018-06-06 NOTE — Telephone Encounter (Signed)
Contacted Dr. Modesta Messing.
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
## 2018-06-06 NOTE — Telephone Encounter (Signed)
Write spoke to the Camera operator at [facility] ED [facility]) and informed them that the patient will be coming.   Writer spoke to the [facility] Ria Comment) assess bed availability.

## 2018-06-06 NOTE — ED Provider Notes (Addendum)
[facility] EMERGENCY DEPARTMENT Provider Note   CSN: [id number] Arrival date & time: 06/06/18  1816     History   Chief Complaint Chief Complaint  Patient presents with  . V70.1    HPI Brenda Spencer is a 27 y.o. female.  Level 5 caveat for psychiatric disorder.  She reports depression and suicidal ideation for at least 1 month.  Her primary care physician put her on some "medications", but they are not working.  She was at the doctor's office today and then transferred by [facility] Department to the emergency department.  Nursing reports to suicidal attempts over the past 2 days, but I am uncertain as to what they were.  No evidence of psychosis.     Past Medical History:  Diagnosis Date  . Acute post-traumatic headache, not intractable 01/19/2016  . Asthma    ALbuterol inhaler as needed  . CHF (congestive heart failure) ([place]) 2011   RESOLVED; HAD CARDIAC EVAL   . Chronic back pain    reason unknown  . Congestive heart failure ([place]) 2011   related to preeclampsia  . Nocturia   . Noncompliance   . Postgastrectomy malabsorption     Patient Active Problem List   Diagnosis Date Noted  . Low back pain 09/30/2016  . Obesity, Class II, BMI 35-39.9 07/20/2016  . Chiari I malformation ([place]) 06/09/2016  . Arnold-Chiari malformation, type I ([place]) 01/20/2016  . Exposure to second hand tobacco smoke 01/19/2016  . Chromosome 1 deletion syndrome   . Parent of child with chromosome abnormality   . History of pregnancy induced hypertension 02/28/2015  . History of congestive heart failure 01/09/2013  . Mild persistent asthma 11/18/2012    Past Surgical History:  Procedure Laterality Date  . CHOLECYSTECTOMY     teenager  . SLEEVE GASTROPLASTY  01/18/2017  . SUBOCCIPITAL CRANIECTOMY CERVICAL LAMINECTOMY N/A 06/09/2016   Procedure: Chiari Decompression;  Surgeon: Consuella Lose, MD;  Location: [facility];  Service: Neurosurgery;  Laterality: N/A;   posterior/occipital  . TEAR DUCT PROBING  as a child  . TONSILLECTOMY     as a teenager  . TUBAL LIGATION Bilateral 09/10/2015   Procedure: POST PARTUM TUBAL LIGATION;  Surgeon: Mora Bellman, MD;  Location: [place] [facility];  Service: Gynecology;  Laterality: Bilateral;  . WISDOM TOOTH EXTRACTION     as a teenager     OB History    Gravida  5   Para  4   Term  3   Preterm  1   AB  1   Living  4     SAB  1   TAB      Ectopic      Multiple  0   Live Births  4        Obstetric Comments  2011 PIH;IOL; PP CHF-HOSPITALIZED X 1 WEEK         Home Medications    Prior to Admission medications   Medication Sig Start Date End Date Taking? Authorizing Provider  albuterol (PROVENTIL) (2.5 MG/3ML) 0.083% nebulizer solution Take 3 mLs (2.5 mg total) by nebulization every 6 (six) hours as needed for wheezing or shortness of breath. 12/24/16  Yes Eustaquio Maize, MD  cyclobenzaprine (FLEXERIL) 5 MG tablet TAKE 1 TABLET BY MOUTH TWICE A DAY AS NEEDED FOR MUSCLE SPASMS 02/02/18  Yes Eustaquio Maize, MD  propranolol (INDERAL) 10 MG tablet Take 1 tablet (10 mg total) by mouth 3 (three) times daily as needed. 05/06/18  Yes Eustaquio Maize, MD  sertraline (ZOLOFT) 50 MG tablet Take half tab for 8 days then take full tab once a day in the morning. Patient taking differently: Take 50 mg by mouth daily.  05/06/18  Yes Eustaquio Maize, MD    Family History Family History  Problem Relation Age of Onset  . Learning disabilities Other   . Colon cancer Maternal Grandmother   . Cancer Maternal Grandmother        colon  . Asthma Brother   . Learning disabilities Brother   . Asthma Daughter   . Learning disabilities Daughter   . Seizures Daughter   . Chromosomal disorder Daughter        1q21.1 microdeletion  . Cataracts Daughter        congenital  . Anesthesia problems Neg Hx   . Other Neg Hx     Social History Social History   Tobacco Use  . Smoking status: Never Smoker  .  Smokeless tobacco: Never Used  Substance Use Topics  . Alcohol use: No    Alcohol/week: 0.0 oz  . Drug use: Yes    Types: Marijuana     Allergies   Metoclopramide and Ondansetron   Review of Systems Review of Systems  All other systems reviewed and are negative.    Physical Exam Updated Vital Signs BP 104/90 (BP Location: Right Arm)   Pulse 80   Temp 98.8 F (37.1 C) (Oral)   Resp 16   Ht '5\' 2"'$  (1.575 m)   Wt 54.4 kg (120 lb)   LMP 05/31/2018   SpO2 100%   BMI 21.95 kg/m   Physical Exam  Constitutional: She is oriented to person, place, and time. She appears well-developed and well-nourished.  HENT:  Head: Normocephalic and atraumatic.  Eyes: Conjunctivae are normal.  Neck: Neck supple.  Cardiovascular: Normal rate and regular rhythm.  Pulmonary/Chest: Effort normal and breath sounds normal.  Abdominal: Soft. Bowel sounds are normal.  Musculoskeletal: Normal range of motion.  Neurological: She is alert and oriented to person, place, and time.  Skin: Skin is warm and dry.  Psychiatric:  Flat affect, depressed.  Nursing note and vitals reviewed.    ED Treatments / Results  Labs (all labs ordered are listed, but only abnormal results are displayed) Labs Reviewed  COMPREHENSIVE METABOLIC PANEL - Abnormal; Notable for the following components:      Result Value   Potassium 3.4 (*)    Calcium 8.4 (*)    All other components within normal limits  CBC - Abnormal; Notable for the following components:   Hemoglobin 9.9 (*)    HCT 32.1 (*)    MCH 25.4 (*)    All other components within normal limits  RAPID URINE DRUG SCREEN, HOSP PERFORMED - Abnormal; Notable for the following components:   Tetrahydrocannabinol POSITIVE (*)    Barbiturates   (*)    Value: Result not available. Reagent lot number recalled by manufacturer.   All other components within normal limits  ACETAMINOPHEN LEVEL - Abnormal; Notable for the following components:   Acetaminophen  (Tylenol), Serum <10 (*)    All other components within normal limits  ETHANOL  PREGNANCY, URINE  SALICYLATE LEVEL    EKG None  Radiology No results found.  Procedures Procedures (including critical care time)  Medications Ordered in ED Medications - No data to display   Initial Impression / Assessment and Plan / ED Course  I have reviewed the triage vital signs and  the nursing notes.  Pertinent labs & imaging results that were available during my care of the patient were reviewed by me and considered in my medical decision making (see chart for details).     Patient presents with depression and suicidal ideation.  Will consult behavioral health.  Patient is not psychotic.  She is anemic, but this does not require transfusion.  She is medically cleared for psychiatric admission.  Final Clinical Impressions(s) / ED Diagnoses   Final diagnoses:  Depression, unspecified depression type  Suicidal ideation  Anemia, unspecified type    ED Discharge Orders    None       Nat Christen, MD 06/06/18 2007    Nat Christen, MD 06/06/18 2137

## 2018-06-06 NOTE — BH Specialist Note (Signed)
[facility] Initial Clinical Assessment  MRN: [id number] NAME: AMELI SANGIOVANNI Date: 06/06/18   Total time: 35 minutes  Type of Contact: Type of Contact: Video Visit Initial Contact Patient consent obtained: Patient consent obtained for Virtual Visit: Yes Reason for Visit today: Reason for Your Call/Visit Today: Video VBH Intake   Treatment History Patient recently received Inpatient Treatment: Have You Recently Been in Any Inpatient Treatment (Hospital/Detox/Crisis Center/28-Day Program)?: No  Facility/Program:   No  Date of discharge:  No  Patient currently being seen by therapist/psychiatrist: No  Patient currently receiving the following services: Patient Currently Receiving the Following Services:: Medication Management(    PCP prescribes the medication )  Past Psychiatric History/Hospitalization(s): Anxiety: Yes Bipolar Disorder: No Depression: Yes Mania: No Psychosis: No Schizophrenia: No Personality Disorder: No Hospitalization for psychiatric illness: No History of Electroconvulsive Shock Therapy: No Prior Suicide Attempts: No  Decreased need for sleep: No  Euphoria: No Self Injurious behaviors No Family History of mental illness: No Family History of substance abuse: No  Substance Abuse: No  DUI: No  Insomnia: No  History of violence No  Physical, sexual or emotional abuse:No  Prior outpatient mental health therapy: No     Clinical Assessment:  PHQ-9 Assessments: Depression screen [facility] 2/9 06/06/2018 05/06/2018 03/15/2018  Decreased Interest 2 1 0  Down, Depressed, Hopeless 3 1 0  PHQ - 2 Score 5 2 0  Altered sleeping 2 3 -  Tired, decreased energy 1 0 -  Change in appetite 0 0 -  Feeling bad or failure about yourself  3 1 -  Trouble concentrating 0 0 -  Moving slowly or fidgety/restless 0 0 -  Suicidal thoughts 3 0 -  PHQ-9 Score 14 6 -  Difficult doing work/chores Somewhat difficult Somewhat difficult -  Some recent data might be  hidden    GAD-7 Assessments: GAD 7 : Generalized Anxiety Score 06/06/2018 06/06/2018 05/06/2018  Nervous, Anxious, on Edge 3 1 3   Control/stop worrying 3 3 3   Worry too much - different things 3 3 3   Trouble relaxing 2 2 3   Restless 2 0 0  Easily annoyed or irritable 2 2 0  Afraid - awful might happen 3 3 3   Total GAD 7 Score 18 14 15   Anxiety Difficulty Extremely difficult - Somewhat difficult     Social Functioning Social maturity: Social Maturity: Impulsive Social judgement: Social Judgement: "Games developer", Normal  Stress Current stressors: Current Stressors: Publishing rights manager, Job loss/unemployment Familial stressors: Familial Stressors: None Sleep: Sleep: Frequent awakening Appetite: Appetite: Decreased Coping ability: Coping ability: Normal  Patient taking medications as prescribed: Patient taking medications as prescribed: Yes  Current medications:  Outpatient Encounter Medications as of 06/06/2018  Medication Sig  . albuterol (PROVENTIL) (2.5 MG/3ML) 0.083% nebulizer solution Take 3 mLs (2.5 mg total) by nebulization every 6 (six) hours as needed for wheezing or shortness of breath.  . cyclobenzaprine (FLEXERIL) 5 MG tablet TAKE 1 TABLET BY MOUTH TWICE A DAY AS NEEDED FOR MUSCLE SPASMS  . diclofenac sodium (VOLTAREN) 1 % GEL Apply 2 g topically 4 (four) times daily.  . propranolol (INDERAL) 10 MG tablet Take 1 tablet (10 mg total) by mouth 3 (three) times daily as needed.  . sertraline (ZOLOFT) 50 MG tablet Take half tab for 8 days then take full tab once a day in the morning.  Marland Kitchen tiZANidine (ZANAFLEX) 4 MG tablet Take 1 tablet (4 mg total) by mouth every 6 (six) hours as needed for muscle  spasms.   No facility-administered encounter medications on file as of June 15, 2018.     Self-harm Behaviors Risk Assessment Self-harm risk factors: Self-harm risk factors: (N/A) Patient endorses recent thoughts of harming self: Yes, - attempted to hang himself before coming to her appt.    [place] Suicide Severity Rating Scale:  C-SRSS 06-15-18  1. Wish to be Dead No  2. Suicidal Thoughts Yes  3. Suicidal Thoughts with Method Without Specific Plan or Intent to Act Yes  4. Suicidal Intent Without Specific Plan No  5. Suicide Intent with Specific Plan No  6. Suicide Behavior Question Yes  7. How long ago did you do any of these? Within the last three months    Danger to Others Risk Assessment Danger to others risk factors: Danger to Others Risk Factors: No risk factors noted Patient endorses recent thoughts of harming others: Notification required: No need or identified person   Substance Use Assessment Patient recently consumed alcohol: Have you recently consumed alcohol?: No  Alcohol Use Disorder Identification Test (AUDIT):  Alcohol Use Disorder Test (AUDIT) 06-15-2018  1. How often do you have a drink containing alcohol? 0  2. How many drinks containing alcohol do you have on a typical day when you are drinking? 0  3. How often do you have six or more drinks on one occasion? 0  AUDIT-C Score 0  Intervention/Follow-up AUDIT Score <7 follow-up not indicated   Patient recently used drugs: Have you recently used any drugs?: No Patient is concerned about dependence or abuse of substances: Does patient seem concerned about dependence or abuse of any substance?: No    Goals, Interventions and Follow-up Plan Goals: Increase healthy adjustment to current life circumstances and Increase motivation to adhere to plan of care Interventions: Motivational Interviewing, Solution-Focused Strategies, Mindfulness or Psychologist, educational, Behavioral Activation, Brief CBT, Supportive Counseling and Sleep Hygiene Follow-up Plan: Per Dr. Modesta Messing - patient will be transported to the [facility] ED.   Summary of Clinical Assessment  Summary:   Patient is a 27 year old female that reports waning to hang herself with a belt this morning.  Two days ago, patient reports that she tried to  run herself off the road.  Patient reports that her boyfriend and mother convinced her not to.  Writer consulted with Dr. Modesta Messing that recommends that then patient be transported to [facility] for safety evaluation with presumed psychiatry admission.     Graciella Freer [place], LCAS-A

## 2018-06-06 NOTE — ED Notes (Signed)
TTS in progress 

## 2018-06-07 ENCOUNTER — Inpatient Hospital Stay (HOSPITAL_COMMUNITY)
Admission: AD | Admit: 2018-06-07 | Discharge: 2018-06-09 | DRG: 885 | Disposition: A | Discharge status: Home / Self Care | Payer: Medicaid Other | Source: Intra-hospital | Attending: Psychiatry | Admitting: Psychiatry

## 2018-06-07 ENCOUNTER — Encounter (HOSPITAL_COMMUNITY): Payer: Self-pay | Admitting: Behavioral Health

## 2018-06-07 DIAGNOSIS — Z79899 Other long term (current) drug therapy: Secondary | ICD-10-CM

## 2018-06-07 DIAGNOSIS — F4010 Social phobia, unspecified: Secondary | ICD-10-CM | POA: Diagnosis present

## 2018-06-07 DIAGNOSIS — F1290 Cannabis use, unspecified, uncomplicated: Secondary | ICD-10-CM | POA: Diagnosis not present

## 2018-06-07 DIAGNOSIS — R45851 Suicidal ideations: Secondary | ICD-10-CM | POA: Diagnosis present

## 2018-06-07 DIAGNOSIS — Z888 Allergy status to other drugs, medicaments and biological substances status: Secondary | ICD-10-CM | POA: Diagnosis not present

## 2018-06-07 DIAGNOSIS — F332 Major depressive disorder, recurrent severe without psychotic features: Principal | ICD-10-CM | POA: Diagnosis present

## 2018-06-07 DIAGNOSIS — Z9884 Bariatric surgery status: Secondary | ICD-10-CM | POA: Diagnosis not present

## 2018-06-07 DIAGNOSIS — F4001 Agoraphobia with panic disorder: Secondary | ICD-10-CM | POA: Diagnosis present

## 2018-06-07 DIAGNOSIS — J45909 Unspecified asthma, uncomplicated: Secondary | ICD-10-CM | POA: Diagnosis present

## 2018-06-07 MED ORDER — HYDROXYZINE HCL 25 MG PO TABS
25.0000 mg | ORAL_TABLET | Freq: Four times a day (QID) | ORAL | Status: DC | PRN
  Administered 2018-06-07 – 2018-06-08 (×2): 25 mg via ORAL
  Filled 2018-06-07: qty 4

## 2018-06-07 MED ORDER — ALUM & MAG HYDROXIDE-SIMETH 200-200-20 MG/5ML PO SUSP: 30.0000 mL | ORAL | Status: DC | PRN

## 2018-06-07 MED ORDER — CYCLOBENZAPRINE HCL 10 MG PO TABS
5.0000 mg | ORAL_TABLET | Freq: Two times a day (BID) | ORAL | Status: DC | PRN
  Administered 2018-06-07 – 2018-06-08 (×2): 5 mg via ORAL
  Filled 2018-06-07 (×2): qty 1

## 2018-06-07 MED ORDER — HYDROXYZINE HCL 25 MG PO TABS: 25.0000 mg | ORAL_TABLET | Freq: Three times a day (TID) | ORAL | Status: DC | PRN

## 2018-06-07 MED ORDER — MAGNESIUM HYDROXIDE 400 MG/5ML PO SUSP: 30.0000 mL | Freq: Every day | ORAL | Status: DC | PRN

## 2018-06-07 MED ORDER — SERTRALINE HCL 50 MG PO TABS
50.0000 mg | ORAL_TABLET | Freq: Every day | ORAL | Status: DC
  Administered 2018-06-08: 50 mg via ORAL
  Filled 2018-06-07 (×3): qty 1

## 2018-06-07 MED ORDER — TRAZODONE HCL 50 MG PO TABS: 50.0000 mg | ORAL_TABLET | Freq: Every evening | ORAL | Status: DC | PRN

## 2018-06-07 MED ORDER — TRAZODONE HCL 50 MG PO TABS
50.0000 mg | ORAL_TABLET | Freq: Every evening | ORAL | Status: DC | PRN
  Administered 2018-06-07 – 2018-06-08 (×2): 50 mg via ORAL

## 2018-06-07 MED ORDER — PROPRANOLOL HCL 10 MG PO TABS
10.0000 mg | ORAL_TABLET | Freq: Three times a day (TID) | ORAL | Status: DC | PRN
Start: 2018-06-07 — End: 2018-06-09

## 2018-06-07 MED ORDER — ACETAMINOPHEN 325 MG PO TABS: 650.0000 mg | ORAL_TABLET | Freq: Four times a day (QID) | ORAL | Status: DC | PRN

## 2018-06-07 NOTE — BHH Suicide Risk Assessment (Signed)
[facility] Admission Suicide Risk Assessment   Nursing information obtained from:  Patient Demographic factors:  Caucasian, Access to firearms, 73, lesbian, or bisexual orientation, Unemployed Current Mental Status:  Suicidal ideation indicated by patient Loss Factors:  NA Historical Factors:  NA Risk Reduction Factors:  Responsible for children under 28 years of age, Sense of responsibility to family  Total Time spent with patient: 45 minutes Principal Problem: Suicidal ideations  Diagnosis:   Patient Active Problem List   Diagnosis Date Noted  . Low back pain [M54.5] 09/30/2016  . Obesity, Class II, BMI 35-39.9 [E66.9] 07/20/2016  . Chiari I malformation ([place]) [G93.5] 06/09/2016  . Arnold-Chiari malformation, type I ([place]) [G93.5] 01/20/2016  . Exposure to second hand tobacco smoke [Z77.22] 01/19/2016  . 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 Parent of child with chromosome abnormality [Z84.89]   . History of pregnancy induced hypertension [Z87.59] 02/28/2015  . History of congestive heart failure [Z86.79] 01/09/2013  . Mild persistent asthma [J45.30] 11/18/2012   Subjective Data:   Continued Clinical Symptoms:  Alcohol Use Disorder Identification Test Final Score (AUDIT): 0 The "Alcohol Use Disorders Identification Test", Guidelines for Use in Primary Care, Second Edition.  World Pharmacologist [facility]). Score between 0-7:  no or low risk or alcohol related problems. Score between 8-15:  moderate risk of alcohol related problems. Score between 16-19:  high risk of alcohol related problems. Score 20 or above:  warrants further diagnostic evaluation for alcohol dependence and treatment.   CLINICAL FACTORS:  27 year old female, lives with boyfriend, brother, children.  Presented to the hospital reporting suicidal ideations with thoughts of driving herself off the road or hanging herself.  States that said ideations started acutely after an argument with her children's father.   Minimizes/denies preceding depression or neurovegetative symptoms of depression.  Had recently been started on Zoloft about 2 weeks ago for anxiety and panic attacks.  She is unsure but does not think that Zoloft trial resulted in increased suicidal ideations or mood instability, but states that it was poorly tolerated due to nausea. Currently minimizes depression, denies suicidal ideations, and presents future oriented.   Psychiatric Specialty Exam: Physical Exam  ROS  Blood pressure 109/90, pulse 86, temperature 98.5 F (36.9 C), temperature source Oral, resp. rate 18, height 5' 0.5" (1.537 m), weight 54.4 kg (120 lb), last menstrual period 05/31/2018.Body mass index is 23.05 kg/m.  See admit note MSE    COGNITIVE FEATURES THAT CONTRIBUTE TO RISK:  Closed-mindedness and Loss of executive function    SUICIDE RISK:   Moderate:  Frequent suicidal ideation with limited intensity, and duration, some specificity in terms of plans, no associated intent, good self-control, limited dysphoria/symptomatology, some risk factors present, and identifiable protective factors, including available and accessible social support.  PLAN OF CARE: Patient will be admitted to inpatient psychiatric unit for stabilization and safety. Will provide and encourage milieu participation. Provide medication management and maked adjustments as needed.  Will follow daily.    I certify that inpatient services furnished can reasonably be expected to improve the patient's condition.   Jenne Campus, MD 06/07/2018, 5:59 PM

## 2018-06-07 NOTE — H&P (Addendum)
Psychiatric Admission Assessment Adult  Patient Identification: Brenda Spencer MRN:  [id number] Date of Evaluation:  06/07/2018 Chief Complaint:  Suicidal ideations Principal Diagnosis: Suicidal Ideations, consider MDD  Diagnosis:   Patient Active Problem List   Diagnosis Date Noted  . Low back pain [M54.5] 09/30/2016  . Obesity, Class II, BMI 35-39.9 [E66.9] 07/20/2016  . Chiari I malformation ([place]) [G93.5] 06/09/2016  . Arnold-Chiari malformation, type I ([place]) [G93.5] 01/20/2016  . Exposure to second hand tobacco smoke [Z77.22] 01/19/2016  . 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 Parent of child with chromosome abnormality [Z84.89]   . History of pregnancy induced hypertension [Z87.59] 02/28/2015  . History of congestive heart failure [Z86.79] 01/09/2013  . Mild persistent asthma [J45.30] 11/18/2012   History of Present Illness: 27 year old divorced female, lives with BF, brother, children. Presented to hospital at the encouragement of her PCP.  Patient reports history of anxiety, panic attacks, and had been started on Zoloft and Propranolol for these symptoms 2-3 weeks prior. She states that on day of admission she had suicidal ideations , with thoughts of driving self off road or hanging self .  States that these thoughts developed acutely  on day of admission and stresses that shehad not been experiencing suicidal ideations before then. Attributes suicidal ideations to tension with her children's father and having an argument  ( via phone ) with him that day. Currently minimizes/denies neuro-vegetative symptoms of depression and denies anhedonia or sadness . Denies changes in sleep, appetite or energy level .  Of note,as above, patient had been started on Zoloft and Propranolol two to three weeks ago for anxiety. She states " the Zoloft makes me feel kind of "off" , nauseous". States she feels better now that she has been off it x 2 days.  Associated  Signs/Symptoms: Depression Symptoms:  anxiety, (Hypo) Manic Symptoms:  Denies  Anxiety Symptoms:  Describes history of panic and social anxiety symptoms but does not feel they have worsened or exacerbated recently  Psychotic Symptoms:  denies PTSD Symptoms: Denies  Total Time spent with patient: 45 minutes  Past Psychiatric History: denies history of prior psychiatric admissions . Denies history of prior suicidal attempts,denies history of self cutting or self injurious ideations, denies history of psychosis. Reports history of panic attacks and some agoraphobia. Describes history of Social Phobia symptoms, with severe anxiety and avoidance of situations where she feels she is at center of attention or has to speak in front of others. Denies history of severe depressive episodes,denies history of post partum depression. Denies history of mania . Denies history of violence .  Is the patient at risk to self? Yes.    Has the patient been a risk to self in the past 6 months? No.  Has the patient been a risk to self within the distant past? No.  Is the patient a risk to others? No.  Has the patient been a risk to others in the past 6 months? No.  Has the patient been a risk to others within the distant past? No.   Prior Inpatient Therapy:  denies  Prior Outpatient Therapy:  reports PCP was prescribing her psychiatric medications  Alcohol Screening: 1. How often do you have a drink containing alcohol?: Never 2. How many drinks containing alcohol do you have on a typical day when you are drinking?: 1 or 2 3. How often do you have six or more drinks on one occasion?: Never AUDIT-C Score: 0 9. Have you  or someone else been injured as a result of your drinking?: No 10. Has a relative or friend or a doctor or another health worker been concerned about your drinking or suggested you cut down?: No Alcohol Use Disorder Identification Test Final Score (AUDIT): 0 Intervention/Follow-up: AUDIT Score <7  follow-up not indicated Substance Abuse History in the last 12 months: denies history of alcohol or drug abuse, states recently experimented with cannabis  Consequences of Substance Abuse: Denies  Previous Psychotropic Medications: states she has been on Zoloft x 1 month. Tolerating well thus far . States this is the first psychiatric medication she has been on. Psychological Evaluations:  Denies  Past Medical History: as below. History of bariatric surgery ( gastric sleeve ) in 2018.  Past Medical History:  Diagnosis Date  . Acute post-traumatic headache, not intractable 01/19/2016  . Asthma    ALbuterol inhaler as needed  . CHF (congestive heart failure) ([place]) 2011   RESOLVED; HAD CARDIAC EVAL   . Chronic back pain    reason unknown  . Congestive heart failure ([place]) 2011   related to preeclampsia  . Nocturia   . Noncompliance   . Postgastrectomy malabsorption     Past Surgical History:  Procedure Laterality Date  . CHOLECYSTECTOMY     teenager  . SLEEVE GASTROPLASTY  01/18/2017  . SUBOCCIPITAL CRANIECTOMY CERVICAL LAMINECTOMY N/A 06/09/2016   Procedure: Chiari Decompression;  Surgeon: Consuella Lose, MD;  Location: [facility];  Service: Neurosurgery;  Laterality: N/A;  posterior/occipital  . TEAR DUCT PROBING  as a child  . TONSILLECTOMY     as a teenager  . TUBAL LIGATION Bilateral 09/10/2015   Procedure: POST PARTUM TUBAL LIGATION;  Surgeon: Mora Bellman, MD;  Location: [facility];  Service: Gynecology;  Laterality: Bilateral;  . WISDOM TOOTH EXTRACTION     as a teenager   Family History: parents separated, patient reports distant relationship with her father, close to her mother, has three siblings Family History  Problem Relation Age of Onset  . Learning disabilities Other   . Colon cancer Maternal Grandmother   . Cancer Maternal Grandmother        colon  . Asthma Brother   . Learning disabilities Brother   . Asthma Daughter   . Learning disabilities Daughter   .  Seizures Daughter   . Chromosomal disorder Daughter        1q21.1 microdeletion  . Cataracts Daughter        congenital  . Anesthesia problems Neg Hx   . Other Neg Hx    Family Psychiatric  History: denies history of mental illness in family, no history of suicides in family. Tobacco Screening: Have you used any form of tobacco in the last 30 days? (Cigarettes, Smokeless Tobacco, Cigars, and/or Pipes): No Social History: 27 year old , divorced, has 4 children ( 8,5,4,2). Children are with their grandmother and with patient's mother. Patient lives with boyfriend and brother, and mother lives next to her. Unemployed . Social History   Substance and Sexual Activity  Alcohol Use No  . Alcohol/week: 0.0 oz     Social History   Substance and Sexual Activity  Drug Use Yes  . Types: Marijuana    Additional Social History:  Allergies:   Allergies  Allergen Reactions  . Metoclopramide Other (See Comments) and Swelling    Tardive dyskinesia  . Ondansetron Nausea And Vomiting   Lab Results:  Results for orders placed or performed during the hospital encounter of 06/06/18 (  from the past 48 hour(s))  Rapid urine drug screen (hospital performed)     Status: Abnormal   Collection Time: 06/06/18  7:30 PM  Result Value Ref Range   Opiates NONE DETECTED NONE DETECTED   Cocaine NONE DETECTED NONE DETECTED   Benzodiazepines NONE DETECTED NONE DETECTED   Amphetamines NONE DETECTED NONE DETECTED   Tetrahydrocannabinol POSITIVE (A) NONE DETECTED   Barbiturates (A) NONE DETECTED    Result not available. Reagent lot number recalled by manufacturer.    Comment: (NOTE) DRUG SCREEN FOR MEDICAL PURPOSES ONLY.  IF CONFIRMATION IS NEEDED FOR ANY PURPOSE, NOTIFY LAB WITHIN 5 DAYS. LOWEST DETECTABLE LIMITS FOR URINE DRUG SCREEN Drug Class                     Cutoff (ng/mL) Amphetamine and metabolites    1000 Barbiturate and metabolites    200 Benzodiazepine                 960 Tricyclics and  metabolites     300 Opiates and metabolites        300 Cocaine and metabolites        300 THC                            50 Performed at [facility], [address] [place], [place] [address]   Pregnancy, urine     Status: None   Collection Time: 06/06/18  7:30 PM  Result Value Ref Range   Preg Test, Ur NEGATIVE NEGATIVE    Comment:        THE SENSITIVITY OF THIS METHODOLOGY IS >20 mIU/mL. Performed at [facility], [address] [place], [place] [address]   Comprehensive metabolic panel     Status: Abnormal   Collection Time: 06/06/18  7:50 PM  Result Value Ref Range   Sodium 140 135 - 145 mmol/L   Potassium 3.4 (L) 3.5 - 5.1 mmol/L   Chloride 108 101 - 111 mmol/L   CO2 25 22 - 32 mmol/L   Glucose, Bld 82 65 - 99 mg/dL   BUN 10 6 - 20 mg/dL   Creatinine, Ser 0.64 0.44 - 1.00 mg/dL   Calcium 8.4 (L) 8.9 - 10.3 mg/dL   Total Protein 6.8 6.5 - 8.1 g/dL   Albumin 3.9 3.5 - 5.0 g/dL   AST 18 15 - 41 U/L   ALT 15 14 - 54 U/L   Alkaline Phosphatase 70 38 - 126 U/L   Total Bilirubin 0.8 0.3 - 1.2 mg/dL   GFR calc non Af Amer >60 >60 mL/min   GFR calc Af Amer >60 >60 mL/min    Comment: (NOTE) The eGFR has been calculated using the CKD EPI equation. This calculation has not been validated in all clinical situations. eGFR's persistently <60 mL/min signify possible Chronic Kidney Disease.    Anion gap 7 5 - 15    Comment: Performed at [facility], [address], [place], [place] [address]  Ethanol     Status: None   Collection Time: 06/06/18  7:50 PM  Result Value Ref Range   Alcohol, Ethyl (B) <10 <10 mg/dL    Comment: (NOTE) Lowest detectable limit for serum alcohol is 10 mg/dL. For medical purposes only. Performed at [facility], [address], [place], [place] [address]   cbc     Status: Abnormal   Collection Time: 06/06/18  7:50 PM  Result Value  Ref Range   WBC 6.6 4.0 - 10.5 K/uL   RBC 3.90 3.87 - 5.11 MIL/uL   Hemoglobin 9.9 (L) 12.0 - 15.0 g/dL   HCT 32.1 (L)  36.0 - 46.0 %   MCV 82.3 78.0 - 100.0 fL   MCH 25.4 (L) 26.0 - 34.0 pg   MCHC 30.8 30.0 - 36.0 g/dL   RDW 15.4 11.5 - 15.5 %   Platelets 326 150 - 400 K/uL    Comment: Performed at [facility], [address], [place], [place] [address]  Salicylate level     Status: None   Collection Time: 06/06/18  7:50 PM  Result Value Ref Range   Salicylate Lvl <2.8 2.8 - 30.0 mg/dL    Comment: Performed at [facility], [address] [place], [place] [address]  Acetaminophen level     Status: Abnormal   Collection Time: 06/06/18  7:50 PM  Result Value Ref Range   Acetaminophen (Tylenol), Serum <10 (L) 10 - 30 ug/mL    Comment: (NOTE) Therapeutic concentrations vary significantly. A range of 10-30 ug/mL  may be an effective concentration for many patients. However, some  are best treated at concentrations outside of this range. Acetaminophen concentrations >150 ug/mL at 4 hours after ingestion  and >50 ug/mL at 12 hours after ingestion are often associated with  toxic reactions. Performed at [facility], [address] [place], [place] [address]     Blood Alcohol level:  Lab Results  Component Value Date   ETH <10 06/06/2018   [facility]  03/07/2009    <5        LOWEST DETECTABLE LIMIT FOR SERUM ALCOHOL IS 5 mg/dL FOR MEDICAL PURPOSES ONLY    Metabolic Disorder Labs:  No results found for: HGBA1C, MPG No results found for: PROLACTIN No results found for: CHOL, TRIG, HDL, CHOLHDL, VLDL, LDLCALC  Current Medications: No current facility-administered medications for this encounter.    PTA Medications: Medications Prior to Admission  Medication Sig Dispense Refill Last Dose  . albuterol (PROVENTIL) (2.5 MG/3ML) 0.083% nebulizer solution Take 3 mLs (2.5 mg total) by nebulization every 6 (six) hours as needed for wheezing or shortness of breath. 150 mL 1 unknown  . cyclobenzaprine (FLEXERIL) 5 MG tablet TAKE 1 TABLET BY MOUTH TWICE A DAY AS NEEDED FOR MUSCLE SPASMS 60 tablet 1 Past Week at  Unknown time  . propranolol (INDERAL) 10 MG tablet Take 1 tablet (10 mg total) by mouth 3 (three) times daily as needed. 30 tablet 2 unknown  . sertraline (ZOLOFT) 50 MG tablet Take half tab for 8 days then take full tab once a day in the morning. (Patient taking differently: Take 50 mg by mouth daily. ) 30 tablet 1 06/04/2018 at Unknown time    Musculoskeletal: Strength & Muscle Tone: within normal limits Gait & Station: normal Patient leans: N/A  Psychiatric Specialty Exam: Physical Exam  Review of Systems  Constitutional: Negative.   HENT: Negative.   Eyes: Negative.   Respiratory: Negative.   Cardiovascular: Negative.   Gastrointestinal: Negative.   Genitourinary: Negative.   Musculoskeletal: Positive for back pain.  Skin: Negative.   Neurological: Negative for seizures and headaches.  Endo/Heme/Allergies: Negative.   Psychiatric/Behavioral: Positive for suicidal ideas.  All other systems reviewed and are negative.   Blood pressure 109/90, pulse 86, temperature 98.5 F (36.9 C), temperature source Oral, resp. rate 18, height 5' 0.5" (1.537 m), weight 54.4 kg (120 lb), last menstrual period 05/31/2018.Body mass index is 23.05 kg/m.  General Appearance: Fairly Groomed  Eye Contact:  Good  Speech:  Normal Rate  Volume:  Normal  Mood:  states " I feel my mood is pretty good today", denies depression at this time and presents euthymic   Affect:  Appropriate and Full Range  Thought Process:  Linear and Descriptions of Associations: Intact  Orientation:  Other:  fully alert and attentive   Thought Content:  denies hallucinations, no delusions, not internally preoccupied   Suicidal Thoughts:  No denies suicidal or self injurious ideations, denies violent or homicidal ideations, contracts for safety on unit   Homicidal Thoughts:  No  Memory:  recent and remote grossly intact   Judgement:  Fair  Insight:  Fair  Psychomotor Activity:  Normal  Concentration:  Concentration: Good  and Attention Span: Good  Recall:  Good  Fund of Knowledge:  Good  Language:  Good  Akathisia:  Negative  Handed:  Right  AIMS (if indicated):     Assets:  Communication Skills Desire for Improvement Resilience  ADL's:  Intact  Cognition:  WNL  Sleep:       Treatment Plan Summary: Daily contact with patient to assess and evaluate symptoms and progress in treatment, Medication management, Plan inpatient admission and medications as below  Observation Level/Precautions:  15 minute checks  Laboratory:  as needed   Psychotherapy:  Milieu, group therapy  Medications:   We discussed options - patient thinks that recent suicidal ideations were related to argument with her children's father and not specifically associated with recent antidepressant trial, but does  State she was also  having side effects and feeling " off " with Zoloft trial. Of note she has no history of bipolar disorder, no history of prior psychiatric medication trials . Since she currently reports  she feels she is doing well off Zoloft,currently minimizes/ denies depression, will observe further and not  restart it or other antidepressant at this time.  Consultations:  As needed   Discharge Concerns:  -  Estimated LOS: 4 days   Other:     Physician Treatment Plan for Primary Diagnosis:  Suicidal Ideations Long Term Goal(s): Improvement in symptoms so as ready for discharge  Short Term Goals: Ability to identify changes in lifestyle to reduce recurrence of condition will improve, Ability to verbalize feelings will improve, Ability to disclose and discuss suicidal ideas, Ability to demonstrate self-control will improve, Ability to identify and develop effective coping behaviors will improve and Ability to maintain clinical measurements within normal limits will improve  Physician Treatment Plan for Secondary Diagnosis: Anxiety, consider Social Phobia Long Term Goal(s): Improvement in symptoms so as ready for  discharge  Short Term Goals: Ability to identify changes in lifestyle to reduce recurrence of condition will improve and Ability to maintain clinical measurements within normal limits will improve  I certify that inpatient services furnished can reasonably be expected to improve the patient's condition.    Jenne Campus, MD 6/25/20191:21 PM

## 2018-06-07 NOTE — ED Provider Notes (Signed)
Patient accepted to [facility], Dr Parke Poisson, bed ready.  Pt is alert, calm, in no distress.  Vitals:   06/07/18 0641 06/07/18 0954  BP: 98/70 107/80  Pulse: 70 66  Resp: 18 18  Temp: 98 F (36.7 C) 98.2 F (36.8 C)  SpO2: 100% 100%   Pt currently appears stable for transport to [facility].      Lajean Saver, MD 06/07/18 1002

## 2018-06-07 NOTE — Progress Notes (Signed)
Pt admitted to the adult unit from [facility]. Pt expressed having passive SI three days ago and yesterday morning. Pt expressed that she got upset after she asked her children's father for support. According to the pt, he told her that he was unable to provide support due to him having a newborn baby. Pt expressed that she have four children ages 68, 88,45 and 28 years old with her ex. Pt expressed that her children's father no longer come around to see his kids and doesn't help out financially. Pt expressed that three of her children have learning disabilities and require daily therapy. Pt requesting to be discharged home as soon as possible to care for her children. Pt signed a 72 hour request for discharge form at 1315. Pt reported that this is her first inpt psych hospitalization and denies any prior suicide attempts. Pt denies any active suicidal thoughts at this time.

## 2018-06-07 NOTE — Progress Notes (Signed)
Nursing Progress Note: 7p-7a D: Pt currently presents with a anxious/guarded affect and behavior. Pt states "I am supposed to get special visitation. I told the nurse today that I need it. She said I could get special visitation. I want to leave and see my four kids." Interacting minimally with the milieu. Pt reports good sleep during the previous night with current medication regimen. Pt did attend wrap-up group.  A: Pt provided with medications per providers orders. Pt's labs and vitals were monitored throughout the night. Pt supported emotionally and encouraged to express concerns and questions. Pt educated on medications.  R: Pt's safety ensured with 15 minute and environmental checks. Pt currently denies SI, HI, and AVH. Pt verbally contracts to seek staff if SI,HI, or AVH occurs and to consult with staff before acting on any harmful thoughts. Will continue to monitor.

## 2018-06-07 NOTE — ED Notes (Addendum)
Patient states she was not informed of going to [facility].  Patient was informed of the reason she is to be accepted at [facility]. She is calm and cooperative and agrees to treatment at [facility].   Pt consented to sign transfer to [facility].

## 2018-06-07 NOTE — ED Notes (Signed)
[facility] Pharmacologist here to transfer patient to [facility]. Dr Ashok Cordia.

## 2018-06-07 NOTE — ED Notes (Signed)
Called [facility] for transport to [facility].  Driver will be coming from [place].

## 2018-06-07 NOTE — Progress Notes (Signed)
Adult Psychoeducational Group Note  Date:  06/07/2018 Time:  11:04 PM  Group Topic/Focus:  Wrap-Up Group:   The focus of this group is to help patients review their daily goal of treatment and discuss progress on daily workbooks.  Participation Level:  Minimal  Participation Quality:  Appropriate  Affect:  Flat  Cognitive:  Alert  Insight: Appropriate  Engagement in Group:  Limited  Modes of Intervention:  Discussion and Support  Additional Comments:  Pt was active in group. Pt goal for today was to work on discharge plans. Pt said she was able to talk to treatment team today and she felt good about her progress. Pt goal for tomorrow is to prepare for discharge and aftercare.  Inetta Fermo 06/07/2018, 11:04 PM

## 2018-06-07 NOTE — Discharge Instructions (Addendum)
Transport to [facility] for admission.

## 2018-06-07 NOTE — Progress Notes (Signed)
Recreation Therapy Notes  Animal-Assisted Activity (AAA) Program Checklist/Progress Notes Patient Eligibility Criteria Checklist & Daily Group note for Rec Tx Intervention  Date: 6.25.19 Time: 42 Location: [address]   AAA/T Program Assumption of Risk Form signed by Teacher, music or Parent Legal Guardian  YES   Patient is free of allergies or sever asthma  YES  Patient reports no fear of animals YES   Patient reports no history of cruelty to animals YES   Patient understands his/her participation is voluntary YES   Patient washes hands before animal contact YES   Patient washes hands after animal contact YES   Education: Contractor, Appropriate Animal Interaction   Education Outcome: Acknowledges understanding/In group clarification offered/Needs additional education.   Clinical Observations/Feedback: Pt did not attend group.    Victorino Sparrow, LRT/CTRS         Victorino Sparrow A 06/07/2018 4:08 PM

## 2018-06-07 NOTE — Tx Team (Signed)
Initial Treatment Plan 06/07/2018 12:50 PM Mistina D Maricela Bo [facility]:[id number]    PATIENT STRESSORS: Marital or family conflict   PATIENT STRENGTHS: Ability for insight Capable of independent living   PATIENT IDENTIFIED PROBLEMS: "intermittent SI"  "I need to get home to my children soon because they have therapy appts".                   DISCHARGE CRITERIA:  Ability to meet basic life and health needs Adequate post-discharge living arrangements Improved stabilization in mood, thinking, and/or behavior  PRELIMINARY DISCHARGE PLAN: Attend aftercare/continuing care group Attend PHP/IOP  PATIENT/FAMILY INVOLVEMENT: This treatment plan has been presented to and reviewed with the patient, [name], and/or family member.  The patient and family have been given the opportunity to ask questions and make suggestions.  Marissa Calamity, RN 06/07/2018, 12:50 PM

## 2018-06-08 ENCOUNTER — Encounter (HOSPITAL_COMMUNITY): Payer: Self-pay | Admitting: Behavioral Health

## 2018-06-08 ENCOUNTER — Telehealth: Payer: Self-pay

## 2018-06-08 ENCOUNTER — Ambulatory Visit: Payer: Medicaid Other | Admitting: Pediatrics

## 2018-06-08 LAB — LIPID PANEL
CHOL/HDL RATIO: 2.6 ratio
Cholesterol: 133 mg/dL (ref 0–200)
HDL: 52 mg/dL (ref >40)
LDL Cholesterol: 73 mg/dL (ref 0–99)
Triglycerides: 41 mg/dL (ref <150)
VLDL: 8 mg/dL (ref 0–40)

## 2018-06-08 LAB — TSH: TSH: 1.104 u[IU]/mL (ref 0.350–4.500)

## 2018-06-08 LAB — HEMOGLOBIN A1C
HEMOGLOBIN A1C: 4.9 % (ref 4.8–5.6)
MEAN PLASMA GLUCOSE: 93.93 mg/dL

## 2018-06-08 NOTE — Telephone Encounter (Signed)
[facility] - Writer routed information to the PCP and Dr. Modesta Messing that the patient is in patient at [facility].   Writer will coordinate with the LCSW working with the patient for services with Dr. Modesta Messing after she discharges.

## 2018-06-08 NOTE — Progress Notes (Signed)
Patient ID: Brenda Spencer, female   DOB: 08-Dec-1991, 27 y.o.   MRN: [id number]  Pt currently presents with an anxious affect and cooperative behavior. Reports ongoing concerns about filing for medicaid at discharge. Pt states goal is to "go home to my kids and family." Plans to do so by making a "plan to see doctor so I can go home." Pt denies any current concerns, seen with a worried facial expression.   Pt provided with medications per providers orders. Pt's labs and vitals were monitored during the shift. Pt given a 1:1 about emotional and mental status. Pt supported and encouraged to express concerns and questions. Pt educated on medications.  Pt's safety ensured with 15 minute and environmental checks. Pt currently denies SI/HI and A/V hallucinations. Pt verbally agrees to seek staff if SI/HI or A/VH occurs and to consult with staff before acting on any harmful thoughts. MD notified of patients wishes. Will continue POC.

## 2018-06-08 NOTE — Progress Notes (Addendum)
[facility] MD Progress Note  06/08/2018 12:33 PM Brenda Spencer  MRN:  [id number]  Subjective: " I am feeling better compared to when I first got here. I was feeling liking wanting to hurt myself after I had an argument with children's father but I don't feel that way anymore.   Objective: Face to face evaluation completed, case discussed with treatment team and chart reviewed. Patient was admitted tot he unit following SI with thoughts of driving self off road or hanging self after having an argument with her children father.    During this evaluation, patient is alert and oriented x4, calm and cooperative. She endorses no feelings of wanting to herm herself and denies SI with plan or intent. She further denies homicidal ideas as well as AVH and does not appear internally preoccupied. She minimizes and continues to deny symptoms of depression at this time. She endorses some anxiety describing it as excessive worry although denies that this may interfere with her daily routine. She denies concerns with appetite or resting pattern. She presents without significant emotional difficulties, anger or irritability. She seems to be focused on discharge and states, " I prefer not to be here and be home with my kids. I have to get my kids by tomorrow because I have no sitter and I have to make it to signs some papers unless I will loose my insurance." She is currently on Zoloft however, reports it was decided that this medication would not be resumed as she was having side effects to the medication and once off, the side effects improved. At her request, she prefers to not be on this medication. At this time, she is contracting for safety on the unit.   Principal Problem: <principal problem not specified> Diagnosis:   Patient Active Problem List   Diagnosis Date Noted  . Severe recurrent major depression without psychotic features ([place]) [F33.2] 06/07/2018  . Low back pain [M54.5] 09/30/2016  . Obesity, Class II,  BMI 35-39.9 [E66.9] 07/20/2016  . Chiari I malformation ([place]) [G93.5] 06/09/2016  . Arnold-Chiari malformation, type I ([place]) [G93.5] 01/20/2016  . Exposure to second hand tobacco smoke [Z77.22] 01/19/2016  . 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 Parent of child with chromosome abnormality [Z84.89]   . History of pregnancy induced hypertension [Z87.59] 02/28/2015  . History of congestive heart failure [Z86.79] 01/09/2013  . Mild persistent asthma [J45.30] 11/18/2012   Total Time spent with patient: 20 minutes  Past Psychiatric History: denies history of prior psychiatric admissions . Denies history of prior suicidal attempts,denies history of self cutting or self injurious ideations, denies history of psychosis. Reports history of panic attacks and some agoraphobia. Describes history of Social Phobia symptoms, with severe anxiety and avoidance of situations where she feels she is at center of attention or has to speak in front of others. Denies history of severe depressive episodes,denies history of post partum depression. Denies history of mania . Denies history of violence .    Past Medical History:  Past Medical History:  Diagnosis Date  . Acute post-traumatic headache, not intractable 01/19/2016  . Asthma    ALbuterol inhaler as needed  . CHF (congestive heart failure) ([place]) 2011   RESOLVED; HAD CARDIAC EVAL   . Chronic back pain    reason unknown  . Congestive heart failure ([place]) 2011   related to preeclampsia  . Nocturia   . Noncompliance   . Postgastrectomy malabsorption     Past Surgical History:  Procedure Laterality  Date  . CHOLECYSTECTOMY     teenager  . SLEEVE GASTROPLASTY  01/18/2017  . SUBOCCIPITAL CRANIECTOMY CERVICAL LAMINECTOMY N/A 06/09/2016   Procedure: Chiari Decompression;  Surgeon: Consuella Lose, MD;  Location: [facility];  Service: Neurosurgery;  Laterality: N/A;  posterior/occipital  . TEAR DUCT PROBING  as a child  . TONSILLECTOMY     as a  teenager  . TUBAL LIGATION Bilateral 09/10/2015   Procedure: POST PARTUM TUBAL LIGATION;  Surgeon: Mora Bellman, MD;  Location: [facility];  Service: Gynecology;  Laterality: Bilateral;  . WISDOM TOOTH EXTRACTION     as a teenager   Family History:  Family History  Problem Relation Age of Onset  . Learning disabilities Other   . Colon cancer Maternal Grandmother   . Cancer Maternal Grandmother        colon  . Asthma Brother   . Learning disabilities Brother   . Asthma Daughter   . Learning disabilities Daughter   . Seizures Daughter   . Chromosomal disorder Daughter        1q21.1 microdeletion  . Cataracts Daughter        congenital  . Anesthesia problems Neg Hx   . Other Neg Hx    Family Psychiatric  History: denies history of mental illness in family, no history of suicides in family.   Social History:  Social History   Substance and Sexual Activity  Alcohol Use No  . Alcohol/week: 0.0 oz     Social History   Substance and Sexual Activity  Drug Use Yes  . Types: Marijuana    Social History   Socioeconomic History  . Marital status: Married    Spouse name: CHRISTOPHER  . Number of children: 1  . Years of education: 80  . Highest education level: Not on file  Occupational History  . Occupation: HOMEMAKER  Social Needs  . Financial resource strain: Not on file  . Food insecurity:    Worry: Not on file    Inability: Not on file  . Transportation needs:    Medical: Not on file    Non-medical: Not on file  Tobacco Use  . Smoking status: Never Smoker  . Smokeless tobacco: Never Used  Substance and Sexual Activity  . Alcohol use: No    Alcohol/week: 0.0 oz  . Drug use: Yes    Types: Marijuana  . Sexual activity: Yes    Partners: Male    Birth control/protection: Surgical  Lifestyle  . Physical activity:    Days per week: Not on file    Minutes per session: Not on file  . Stress: Not on file  Relationships  . Social connections:    Talks on phone:  Not on file    Gets together: Not on file    Attends religious service: Not on file    Active member of club or organization: Not on file    Attends meetings of clubs or organizations: Not on file    Relationship status: Not on file  Other Topics Concern  . Not on file  Social History Narrative   3-4 cups of tea and soda a day    Additional Social History:       Sleep: Fair  Appetite:  Fair  Current Medications: Current Facility-Administered Medications  Medication Dose Route Frequency Provider Last Rate Last Dose  . acetaminophen (TYLENOL) tablet 650 mg  650 mg Oral Q6H PRN Rozetta Nunnery, NP      . alum &  mag hydroxide-simeth (MAALOX/MYLANTA) 200-200-20 MG/5ML suspension 30 mL  30 mL Oral Q4H PRN Lindon Romp A, NP      . cyclobenzaprine (FLEXERIL) tablet 5 mg  5 mg Oral BID PRN Cobos, Myer Peer, MD   5 mg at 06/07/18 2129  . hydrOXYzine (ATARAX/VISTARIL) tablet 25 mg  25 mg Oral Q6H PRN Cobos, Myer Peer, MD   25 mg at 06/07/18 2129  . magnesium hydroxide (MILK OF MAGNESIA) suspension 30 mL  30 mL Oral Daily PRN Lindon Romp A, NP      . propranolol (INDERAL) tablet 10 mg  10 mg Oral TID PRN Lindon Romp A, NP      . sertraline (ZOLOFT) tablet 50 mg  50 mg Oral Daily Lindon Romp A, NP   50 mg at 06/08/18 0810  . traZODone (DESYREL) tablet 50 mg  50 mg Oral QHS PRN Cobos, Myer Peer, MD   50 mg at 06/07/18 2129    Lab Results:  Results for orders placed or performed during the hospital encounter of 06/07/18 (from the past 48 hour(s))  Hemoglobin A1c     Status: None   Collection Time: 06/08/18  6:30 AM  Result Value Ref Range   Hgb A1c MFr Bld 4.9 4.8 - 5.6 %    Comment: (NOTE) Pre diabetes:          5.7%-6.4% Diabetes:              >6.4% Glycemic control for   <7.0% adults with diabetes    Mean Plasma Glucose 93.93 mg/dL    Comment: Performed at [facility], [place] [address] [place], [place] [address]  Lipid panel     Status: None   Collection Time: 06/08/18   6:30 AM  Result Value Ref Range   Cholesterol 133 0 - 200 mg/dL   Triglycerides 41 <150 mg/dL   HDL 52 >40 mg/dL   Total CHOL/HDL Ratio 2.6 RATIO   VLDL 8 0 - 40 mg/dL   LDL Cholesterol 73 0 - 99 mg/dL    Comment:        Total Cholesterol/HDL:CHD Risk Coronary Heart Disease Risk Table                     Men   Women  1/2 Average Risk   3.4   3.3  Average Risk       5.0   4.4  2 X Average Risk   9.6   7.1  3 X Average Risk  23.4   11.0        Use the calculated Patient Ratio above and the CHD Risk Table to determine the patient's CHD Risk.        ATP III CLASSIFICATION (LDL):  <100     mg/dL   Optimal  100-129  mg/dL   Near or Above                    Optimal  130-159  mg/dL   Borderline  160-189  mg/dL   High  >190     mg/dL   Very High Performed at [place] [address], [place], [place] [address]   TSH     Status: None   Collection Time: 06/08/18  6:30 AM  Result Value Ref Range   TSH 1.104 0.350 - 4.500 uIU/mL    Comment: Performed by a 3rd Generation assay with a functional sensitivity of <=0.01 uIU/mL. Performed at [facility]  [place] [address], [place], [place] [address]     Blood Alcohol level:  Lab Results  Component Value Date   ETH <10 06/06/2018   [facility]  03/07/2009    <5        LOWEST DETECTABLE LIMIT FOR SERUM ALCOHOL IS 5 mg/dL FOR MEDICAL PURPOSES ONLY    Metabolic Disorder Labs: Lab Results  Component Value Date   HGBA1C 4.9 06/08/2018   MPG 93.93 06/08/2018   No results found for: PROLACTIN Lab Results  Component Value Date   CHOL 133 06/08/2018   TRIG 41 06/08/2018   HDL 52 06/08/2018   CHOLHDL 2.6 06/08/2018   VLDL 8 06/08/2018   LDLCALC 73 06/08/2018    Physical Findings: AIMS: Facial and Oral Movements Muscles of Facial Expression: None, normal Lips and Perioral Area: None, normal Jaw: None, normal Tongue: None, normal,Extremity Movements Upper (arms, wrists, hands, fingers): None,  normal Lower (legs, knees, ankles, toes): None, normal,  , Overall Severity Severity of abnormal movements (highest score from questions above): None, normal Incapacitation due to abnormal movements: None, normal Patient's awareness of abnormal movements (rate only patient's report): No Awareness, Dental Status Current problems with teeth and/or dentures?: No Does patient usually wear dentures?: No  CIWA:    COWS:     Musculoskeletal: Strength & Muscle Tone: within normal limits Gait & Station: normal Patient leans: N/A  Psychiatric Specialty Exam: Physical Exam  Nursing note and vitals reviewed. Constitutional: She is oriented to person, place, and time.  Neurological: She is alert and oriented to person, place, and time.    Review of Systems  Psychiatric/Behavioral: Positive for depression. Negative for hallucinations, memory loss, substance abuse and suicidal ideas. The patient is nervous/anxious. The patient does not have insomnia.   All other systems reviewed and are negative.   Blood pressure 99/73, pulse (!) 103, temperature 98.3 F (36.8 C), temperature source Oral, resp. rate 18, height 5' 0.5" (1.537 m), weight 54.4 kg (120 lb), last menstrual period 05/31/2018.Body mass index is 23.05 kg/m.  General Appearance: Fairly Groomed  Eye Contact:  Good  Speech:  Normal Rate  Volume:  Normal  Mood:  minimizes depression. Enodrses improved mood   Affect:  Appropriate  Thought Process:  Coherent, Goal Directed, Linear and Descriptions of Associations: Intact  Orientation:  Full (Time, Place, and Person)  Thought Content:  Logical denies hallucinations, no delusions, not internally preoccupied   Suicidal Thoughts:  No denies suicidal or self injurious ideations, contracts for safety on unit   Homicidal Thoughts:  No  Memory:  Immediate;   Fair Recent;   Fair  Judgement:  Fair  Insight:  Fair  Psychomotor Activity:  Normal  Concentration:  Concentration: Fair and Attention  Span: Fair  Recall:  [employer] of Knowledge:  Fair  Language:  Good  Akathisia:  Negative  Handed:  Right  AIMS (if indicated):     Assets:  Communication Skills Desire for Improvement Resilience Social Support  ADL's:  Intact  Cognition:  WNL  Sleep:        Treatment Plan Summary: Daily contact with patient to assess and evaluate symptoms and progress in treatment  Discontinued Zoloft 50 mg po daily for depression as patient reports she feels better off the medication. She also continues to minimize/ deny depression. Will continue to observe mood and behavior and not start other antidepressant at this time. Patient is open to therapy following discharge.  Continue Vistaril 25 mg po q6hrs  as needed for anxiety Continue Trazodone 50 mg po daily for insomnia  Continue Flexeril 5 mg po bid as needed for muscle spasms Continue Inderal 10 mg po TID as needed for anxiety   Other:  Safety: Will continue 15 minute observation for safety checks. Patient is able to contract for safety on the unit at this time  Treat health problems as indicated.    Continue to develop treatment plan to decrease risk of relapse upon discharge and to reduce the need for readmission.  Psycho-social education regarding relapse prevention and self care.  Health care follow up as needed for medical problems.  Continue to attend and participate in therapy.   Labs: TSH, Lipid panel and HgbA1c normal. UDS positive for THC. Pregnancy negative.   CSW will continue to work on discharge disposition.   Mordecai Maes, NP 06/08/2018, 12:33 PM   ..Agree with NP Progress Note
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
## 2018-06-08 NOTE — Therapy (Signed)
Occupational Therapy Group Treatment Note  Date:  06/08/2018 Time:  3:14 PM  Group Topic/Focus:  Stress Management  Participation Level:  Minimal  Participation Quality:  Inattentive  Affect:  Flat  Cognitive:  Appropriate  Insight: Improving  Engagement in Group:  Limited  Modes of Intervention:  Activity, Discussion, Education and Socialization  Additional Comments:    S: Pt did not offer comment this date  O: Stress management group completed to use as productive coping strategy, to help mitigate maladaptive coping to integrate in functional BADL/IADL. Education given on the definition of stress and its cognitive, behavioral, emotional, and physical effects on the body. Stress symptom checklist completed. Stress management tool worksheet discussed to educate on unhealthy vs healthy coping skills to manage stress to improve community integration. Self control circle activity completed to identify areas of control and areas not within personal control. Education given on use of progressive muscle relaxation. PMR script delivered with relaxing music to facilitate relaxation response to help increase ability to engage in BADL. Coloring and relaxation guide handouts given at the end of the session.   A: Pt presents to group with flat affect, many phsyically obvious anxious behaviors. Pt not fully attending or engaging with facilitator or group members. Pt arriived to group at end of ecplanation of stress management tools work sheet. Pt not willing to share stress checklist score, unsure if pt actually engaged. Pt did not engage in self control circle activity. Pt engaged in G. L. Garcia script, not making comments but appeared more relaxed (less physical signs) during activity.  P: Pt provided with education on stress management activities to implement into daily routine. Handouts given to facilitate carryover when reintegrating into community   [facility], [place], OTR/L  [employer] 06/08/2018, 3:14 PM

## 2018-06-08 NOTE — [facility] Suicide Risk Assessment (Signed)
[place] INPATIENT:  Family/Significant Other Suicide Prevention Education  Suicide Prevention Education:  Education Completed; Daphane Shepherd [phone])- patient fianc  has been identified by the patient as the family member/significant other with whom the patient will be residing, and identified as the person(s) who will aid the patient in the event of a mental health crisis (suicidal ideations/suicide attempt).  With written consent from the patient, the family member/significant other has been provided the following suicide prevention education, prior to the and/or following the discharge of the patient.  The suicide prevention education provided includes the following:  Suicide risk factors  Suicide prevention and interventions  National Suicide Hotline telephone number  [facility] assessment telephone number  [facility] Emergency Assistance [place] and/or Residential Mobile Crisis Unit telephone number  Request made of family/significant other to:  Remove weapons (e.g., guns, rifles, knives), all items previously/currently identified as safety concern.    Remove drugs/medications (over-the-counter, prescriptions, illicit drugs), all items previously/currently identified as a safety concern.  The family member/significant other verbalizes understanding of the suicide prevention education information provided.  The family member/significant other agrees to remove the items of safety concern listed above. Ellene Route states there are no guns or weapons in the home. Fianc states, "she really doesn't have alone time because I'm always there." He states "I keep things away from her, and make sure that she's okay." He states patient has a strong support system. States he will continue to monitor patient. States patient's relationships with her family members can be a stressor, and states patient tends to "overread into situations." States he will support patient  in attending and getting to outpatient therapy/medication management.   Virgilio Frees, LCSW 06/08/2018, 3:09 PM

## 2018-06-08 NOTE — Progress Notes (Signed)
Recreation Therapy Notes  Date: 6.26.19 Time: 0930 Location: [address] Dayroom  Group Topic: Stress Management  Goal Area(s) Addresses:  Patient will verbalize importance of using healthy stress management.  Patient will identify positive emotions associated with healthy stress management.   Intervention: Stress Management  Activity :  Body Scan Meditation.  LRT introduced the stress management technique of meditation.  LRT played meditation to allow patients to take note of any sensations or tensions they may be feeling.  Patients were to follow along as meditation played.  Education:  Stress Management, Discharge Planning.   Education Outcome: Acknowledges edcuation/In group clarification offered/Needs additional education  Clinical Observations/Feedback: Pt did not attend group.     Victorino Sparrow, LRT/CTRS         Ria Comment, Rebbeca Sheperd A 06/08/2018 12:12 PM

## 2018-06-08 NOTE — Tx Team (Signed)
Interdisciplinary Treatment and Diagnostic Plan Update  06/08/2018 Time of Session: 9:20am Brenda Spencer MRN: [id number]  Principal Diagnosis: <principal problem not specified>  Secondary Diagnoses: Active Problems:   Severe recurrent major depression without psychotic features (HCC)   Current Medications:  Current Facility-Administered Medications  Medication Dose Route Frequency Provider Last Rate Last Dose  . acetaminophen (TYLENOL) tablet 650 mg  650 mg Oral Q6H PRN Lindon Romp A, NP      . alum & mag hydroxide-simeth (MAALOX/MYLANTA) 200-200-20 MG/5ML suspension 30 mL  30 mL Oral Q4H PRN Lindon Romp A, NP      . cyclobenzaprine (FLEXERIL) tablet 5 mg  5 mg Oral BID PRN Cobos, Myer Peer, MD   5 mg at 06/07/18 2129  . hydrOXYzine (ATARAX/VISTARIL) tablet 25 mg  25 mg Oral Q6H PRN Cobos, Myer Peer, MD   25 mg at 06/07/18 2129  . magnesium hydroxide (MILK OF MAGNESIA) suspension 30 mL  30 mL Oral Daily PRN Lindon Romp A, NP      . propranolol (INDERAL) tablet 10 mg  10 mg Oral TID PRN Rozetta Nunnery, NP      . traZODone (DESYREL) tablet 50 mg  50 mg Oral QHS PRN Cobos, Myer Peer, MD   50 mg at 06/07/18 2129   PTA Medications: Medications Prior to Admission  Medication Sig Dispense Refill Last Dose  . albuterol (PROVENTIL) (2.5 MG/3ML) 0.083% nebulizer solution Take 3 mLs (2.5 mg total) by nebulization every 6 (six) hours as needed for wheezing or shortness of breath. 150 mL 1 unknown  . cyclobenzaprine (FLEXERIL) 5 MG tablet TAKE 1 TABLET BY MOUTH TWICE A DAY AS NEEDED FOR MUSCLE SPASMS 60 tablet 1 Past Week at Unknown time  . propranolol (INDERAL) 10 MG tablet Take 1 tablet (10 mg total) by mouth 3 (three) times daily as needed. 30 tablet 2 unknown  . sertraline (ZOLOFT) 50 MG tablet Take half tab for 8 days then take full tab once a day in the morning. (Patient taking differently: Take 50 mg by mouth daily. ) 30 tablet 1 06/04/2018 at Unknown time    Patient Stressors:  Marital or family conflict  Patient Strengths: Ability for insight Capable of independent living  Treatment Modalities: Medication Management, Group therapy, Case management,  1 to 1 session with clinician, Psychoeducation, Recreational therapy.   Physician Treatment Plan for Primary Diagnosis: <principal problem not specified> Long Term Goal(s): Improvement in symptoms so as ready for discharge Improvement in symptoms so as ready for discharge   Short Term Goals: Ability to identify changes in lifestyle to reduce recurrence of condition will improve Ability to verbalize feelings will improve Ability to disclose and discuss suicidal ideas Ability to demonstrate self-control will improve Ability to identify and develop effective coping behaviors will improve Ability to maintain clinical measurements within normal limits will improve Ability to identify changes in lifestyle to reduce recurrence of condition will improve Ability to maintain clinical measurements within normal limits will improve  Medication Management: Evaluate patient's response, side effects, and tolerance of medication regimen.  Therapeutic Interventions: 1 to 1 sessions, Unit Group sessions and Medication administration.  Evaluation of Outcomes: Not Met  Physician Treatment Plan for Secondary Diagnosis: Active Problems:   Severe recurrent major depression without psychotic features ([place])  Long Term Goal(s): Improvement in symptoms so as ready for discharge Improvement in symptoms so as ready for discharge   Short Term Goals: Ability to identify changes in lifestyle to reduce recurrence of condition will  improve Ability to verbalize feelings will improve Ability to disclose and discuss suicidal ideas Ability to demonstrate self-control will improve Ability to identify and develop effective coping behaviors will improve Ability to maintain clinical measurements within normal limits will improve Ability to  identify changes in lifestyle to reduce recurrence of condition will improve Ability to maintain clinical measurements within normal limits will improve     Medication Management: Evaluate patient's response, side effects, and tolerance of medication regimen.  Therapeutic Interventions: 1 to 1 sessions, Unit Group sessions and Medication administration.  Evaluation of Outcomes: Not Met   RN Treatment Plan for Primary Diagnosis: <principal problem not specified> Long Term Goal(s): Knowledge of disease and therapeutic regimen to maintain health will improve  Short Term Goals: Ability to verbalize feelings will improve, Ability to disclose and discuss suicidal ideas, Ability to identify and develop effective coping behaviors will improve and Compliance with prescribed medications will improve  Medication Management: RN will administer medications as ordered by provider, will assess and evaluate patient's response and provide education to patient for prescribed medication. RN will report any adverse and/or side effects to prescribing provider.  Therapeutic Interventions: 1 on 1 counseling sessions, Psychoeducation, Medication administration, Evaluate responses to treatment, Monitor vital signs and CBGs as ordered, Perform/monitor CIWA, COWS, AIMS and Fall Risk screenings as ordered, Perform wound care treatments as ordered.  Evaluation of Outcomes: Not Met   LCSW Treatment Plan for Primary Diagnosis: <principal problem not specified> Long Term Goal(s): Safe transition to appropriate next level of care at discharge, Engage patient in therapeutic group addressing interpersonal concerns.  Short Term Goals: Engage patient in aftercare planning with referrals and resources  Therapeutic Interventions: Assess for all discharge needs, 1 to 1 time with Social worker, Explore available resources and support systems, Assess for adequacy in community support network, Educate family and significant  other(s) on suicide prevention, Complete Psychosocial Assessment, Interpersonal group therapy.  Evaluation of Outcomes: Not Met   Progress in Treatment: Attending groups: No. Participating in groups: No. Taking medication as prescribed: Yes. Toleration medication: Yes. Family/Significant other contact made: No, will contact:  if patient provides consent Patient understands diagnosis: Yes. Discussing patient identified problems/goals with staff: Yes. Medical problems stabilized or resolved: Yes. Denies suicidal/homicidal ideation: Yes. Issues/concerns per patient self-inventory: No. Other:   New problem(s) identified: None   New Short Term/Long Term Goal(s):medication stabilization, elimination of SI thoughts, development of comprehensive mental wellness plan.   Patient Goals:  "I need to get home to my children soon because they have therapy appts"  Discharge Plan or Barriers:   Reason for Continuation of Hospitalization: Depression Medication stabilization Suicidal ideation  Estimated Length of Stay:3-5 days   Attendees: Patient: Brenda Spencer 06/08/2018 4:03 PM  Physician: Dr. Neita Garnet, MD 06/08/2018 4:03 PM  Nursing: Chrys Racer, RN 06/08/2018 4:03 PM  RN Care Manager: Rhunette Croft 06/08/2018 4:03 PM  Social Worker: Radonna Ricker, [place] 06/08/2018 4:03 PM  Recreational Therapist: Rhunette Croft 06/08/2018 4:03 PM  Other: X 06/08/2018 4:03 PM  Other: X 06/08/2018 4:03 PM  Other:X 06/08/2018 4:03 PM    Scribe for Treatment Team: Marylee Floras, [place] 06/08/2018 4:03 PM

## 2018-06-08 NOTE — Progress Notes (Signed)
Nursing Progress Note: 7p-7a D: Pt currently presents with a anxious/guarded affect and behavior. Pt states "I don't need to be here. I want to go home to my kids. They don't have anyone to look out for them while im in here." Interacting minimally with the milieu. Pt reports good sleep during the previous night with current medication regimen. Pt did attend wrap-up group.  A: Pt provided with medications per providers orders. Pt's labs and vitals were monitored throughout the night. Pt supported emotionally and encouraged to express concerns and questions. Pt educated on medications.  R: Pt's safety ensured with 15 minute and environmental checks. Pt currently denies SI, HI, and AVH. Pt verbally contracts to seek staff if SI,HI, or AVH occurs and to consult with staff before acting on any harmful thoughts. Will continue to monitor.

## 2018-06-09 ENCOUNTER — Encounter (HOSPITAL_COMMUNITY): Payer: Self-pay | Admitting: Behavioral Health

## 2018-06-09 DIAGNOSIS — R45851 Suicidal ideations: Secondary | ICD-10-CM

## 2018-06-09 MED ORDER — PROPRANOLOL HCL 10 MG PO TABS: 10.0000 mg | ORAL_TABLET | Freq: Three times a day (TID) | ORAL | 0 refills | Status: DC | PRN

## 2018-06-09 MED ORDER — HYDROXYZINE HCL 25 MG PO TABS: 25.0000 mg | ORAL_TABLET | Freq: Four times a day (QID) | ORAL | 0 refills | Status: DC | PRN

## 2018-06-09 MED ORDER — TRAZODONE HCL 50 MG PO TABS: 50.0000 mg | ORAL_TABLET | Freq: Every evening | ORAL | 0 refills | Status: DC | PRN

## 2018-06-09 MED ORDER — CYCLOBENZAPRINE HCL 5 MG PO TABS: 5.0000 mg | ORAL_TABLET | Freq: Two times a day (BID) | ORAL | 0 refills | Status: DC | PRN

## 2018-06-09 NOTE — Progress Notes (Signed)
Pt denies SI at time of discharge. Pt received both written and verbal discharge instructions. Pt verbalized understanding of discharge instructions. Pt agreed to med regimen. Pt received d/c packet, prescriptions and belongings. Pt received cash 400.00) from security safe. Pt safely discharged to the lobby.

## 2018-06-09 NOTE — [facility] Group Notes (Signed)
Adult Psychoeducational Group Note  Date:  06/09/2018 Time:  4:14 PM  Group Topic/Focus:  Goals Group:   The focus of this group is to help patients establish daily goals to achieve during treatment and discuss how the patient can incorporate goal setting into their daily lives to aide in recovery.  Participation Level:  Active  Participation Quality:  Appropriate  Affect:  Appropriate  Cognitive:  Alert  Insight: Good  Engagement in Group:  Engaged  Modes of Intervention:  Discussion  Additional Comments:  Patient is looking fwd to discharging home today. Patient participated and engaged in group activity on crisis management in the adult unit workbook. Patient was able to identify feelings leading up to a crisis situation.      Brenda Spencer Brenda Spencer 06/09/2018, 4:14 PM

## 2018-06-09 NOTE — Discharge Summary (Addendum)
Physician Discharge Summary Note  Patient:  Brenda Spencer is an 27 y.o., female MRN:  [id number] DOB:  03/10/1991 Patient phone:  [phone] (home)  Patient address:   [place] [address],  Total Time spent with patient: 30 minutes  Date of Admission:  06/07/2018 Date of Discharge: 06/09/2018  Reason for Admission: suicidal ideations , with thoughts of driving self off road or hanging self .      Principal Problem: <principal problem not specified> Discharge Diagnoses: Patient Active Problem List   Diagnosis Date Noted  . Severe recurrent major depression without psychotic features ([place]) [F33.2] 06/07/2018  . Low back pain [M54.5] 09/30/2016  . Obesity, Class II, BMI 35-39.9 [E66.9] 07/20/2016  . Chiari I malformation ([place]) [G93.5] 06/09/2016  . Arnold-Chiari malformation, type I ([place]) [G93.5] 01/20/2016  . Exposure to second hand tobacco smoke [Z77.22] 01/19/2016  . 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 Parent of child with chromosome abnormality [Z84.89]   . History of pregnancy induced hypertension [Z87.59] 02/28/2015  . History of congestive heart failure [Z86.79] 01/09/2013  . Mild persistent asthma [J45.30] 11/18/2012    Past Psychiatric History: denies history of prior psychiatric admissions . Denies history of prior suicidal attempts,denies history of self cutting or self injurious ideations, denies history of psychosis. Reports history of panic attacks and some agoraphobia. Describes history of Social Phobia symptoms, with severe anxiety and avoidance of situations where she feels she is at center of attention or has to speak in front of others. Denies history of severe depressive episodes,denies history of post partum depression. Denies history of mania . Denies history of violence .    Past Medical History:  Past Medical History:  Diagnosis Date  . Acute post-traumatic headache, not intractable 01/19/2016  . Asthma    ALbuterol inhaler  as needed  . CHF (congestive heart failure) ([place]) 2011   RESOLVED; HAD CARDIAC EVAL   . Chronic back pain    reason unknown  . Congestive heart failure ([place]) 2011   related to preeclampsia  . Nocturia   . Noncompliance   . Postgastrectomy malabsorption     Past Surgical History:  Procedure Laterality Date  . CHOLECYSTECTOMY     teenager  . SLEEVE GASTROPLASTY  01/18/2017  . SUBOCCIPITAL CRANIECTOMY CERVICAL LAMINECTOMY N/A 06/09/2016   Procedure: Chiari Decompression;  Surgeon: Consuella Lose, MD;  Location: [facility];  Service: Neurosurgery;  Laterality: N/A;  posterior/occipital  . TEAR DUCT PROBING  as a child  . TONSILLECTOMY     as a teenager  . TUBAL LIGATION Bilateral 09/10/2015   Procedure: POST PARTUM TUBAL LIGATION;  Surgeon: Mora Bellman, MD;  Location: [facility];  Service: Gynecology;  Laterality: Bilateral;  . WISDOM TOOTH EXTRACTION     as a teenager   Family History:  Family History  Problem Relation Age of Onset  . Learning disabilities Other   . Colon cancer Maternal Grandmother   . Cancer Maternal Grandmother        colon  . Asthma Brother   . Learning disabilities Brother   . Asthma Daughter   . Learning disabilities Daughter   . Seizures Daughter   . Chromosomal disorder Daughter        1q21.1 microdeletion  . Cataracts Daughter        congenital  . Anesthesia problems Neg Hx   . Other Neg Hx    Family Psychiatric  History: denies history of mental illness in family, no history of  suicides in family.   Social History:  Social History   Substance and Sexual Activity  Alcohol Use No  . Alcohol/week: 0.0 oz     Social History   Substance and Sexual Activity  Drug Use Yes  . Types: Marijuana    Social History   Socioeconomic History  . Marital status: Married    Spouse name: CHRISTOPHER  . Number of children: 1  . Years of education: 38  . Highest education level: Not on file  Occupational History  . Occupation: HOMEMAKER   Social Needs  . Financial resource strain: Not on file  . Food insecurity:    Worry: Not on file    Inability: Not on file  . Transportation needs:    Medical: Not on file    Non-medical: Not on file  Tobacco Use  . Smoking status: Never Smoker  . Smokeless tobacco: Never Used  Substance and Sexual Activity  . Alcohol use: No    Alcohol/week: 0.0 oz  . Drug use: Yes    Types: Marijuana  . Sexual activity: Yes    Partners: Male    Birth control/protection: Surgical  Lifestyle  . Physical activity:    Days per week: Not on file    Minutes per session: Not on file  . Stress: Not on file  Relationships  . Social connections:    Talks on phone: Not on file    Gets together: Not on file    Attends religious service: Not on file    Active member of club or organization: Not on file    Attends meetings of clubs or organizations: Not on file    Relationship status: Not on file  Other Topics Concern  . Not on file  Social History Narrative   3-4 cups of tea and soda a day     Hospital Course:  27 year old divorced female, lives with BF, brother, children. Presented to hospital at the encouragement of her PCP.  Patient reports history of anxiety, panic attacks, and had been started on Zoloft and Propranolol for these symptoms 2-3 weeks prior. She states that on day of admission she had suicidal ideations , with thoughts of driving self off road or hanging self .  States that these thoughts developed acutely  on day of admission and stresses that shehad not been experiencing suicidal ideations before then. Attributes suicidal ideations to tension with her children's father and having an argument  ( via phone ) with him that day. Currently minimizes/denies neuro-vegetative symptoms of depression and denies anhedonia or sadness . Denies changes in sleep, appetite or energy level .  Of note,as above, patient had been started on Zoloft and Propranolol two to three weeks ago for anxiety.  She states " the Zoloft makes me feel kind of "off" , nauseous". States she feels better now that she has been off it x 2 days.  After the above admission assessment and during this hospital course, patients presenting symptoms were identified. Labs were reviewed and her TSH, Lipid panel and HgbA1c normal. UDS positive for THC. Pregnancy negative. Treatment options were discussed with patient during her initital evaluation. Patient minimized/denied depressive symptoms. She was on Zoloft prior to her admission although endorsed side effects related to use. She had been off of Zoloft for two days prior to admission and endorsed feeling better and doing well without being on the medication. Observed mood and behavior while on the unit and she contnued to deny/minimize depressive symptoms. Zoloft  discontinued and  no other antidepressant started during her hospital course. She was educated on the need to follow-up with outpatient therapy and counseling  for her mental health needs as no psychotropics were not started. She was provided with information for follow-up and encouraged to particpate in after care although she declined referrals for aftercare. She was provided a list of providers per her request. While on the unit, she remained compliant with therapeutic milieu and actively participated in group counseling sessions. She  was able to verbalize learned coping skills for better management of depression and suicidal thoughts and to better maintain these thoughts and symptoms when returning home.  During the course of her hospitalization, improvement of patients condition was monitored by observation and patients daily report of symptom reduction, presentation of good affect, and overall improvement in mood & behavior.Upon discharge, Christel denied any SI/HI, AVH, delusional thoughts, or paranoia. She endorsed overall improvement in symptoms.   Prior to discharge, Domingue's case was presented during treatment  team meeting this morning. The team members were all in agreement that she was both mentally & medically stable to be discharged to continue mental health care on an outpatient basis as noted below. She was provided with prescriptions of her [facility] discharge medications to resume following discharge. She left [facility] with all personal belongings in no apparent distress. Transportation per patients arrangement.  Physical Findings: AIMS: Facial and Oral Movements Muscles of Facial Expression: None, normal Lips and Perioral Area: None, normal Jaw: None, normal Tongue: None, normal,Extremity Movements Upper (arms, wrists, hands, fingers): None, normal Lower (legs, knees, ankles, toes): None, normal,  , Overall Severity Severity of abnormal movements (highest score from questions above): None, normal Incapacitation due to abnormal movements: None, normal Patient's awareness of abnormal movements (rate only patient's report): No Awareness, Dental Status Current problems with teeth and/or dentures?: No Does patient usually wear dentures?: No  CIWA:    COWS:     Musculoskeletal: Strength & Muscle Tone: within normal limits Gait & Station: normal Patient leans: N/A  Psychiatric Specialty Exam: SEE SRA BY MD  Physical Exam  Nursing note and vitals reviewed. Constitutional: She is oriented to person, place, and time.  Neurological: She is alert and oriented to person, place, and time.    Review of Systems  Psychiatric/Behavioral: Negative for hallucinations, memory loss, substance abuse and suicidal ideas. Depression: improved. Nervous/anxious: improved. Insomnia: improved.   All other systems reviewed and are negative.   Blood pressure 100/69, pulse 91, temperature 98.3 F (36.8 C), temperature source Oral, resp. rate 16, height 5' 0.5" (1.537 m), weight 54.4 kg (120 lb), last menstrual period 05/31/2018.Body mass index is 23.05 kg/m.    Have you used any form of tobacco in the last 30 days?  (Cigarettes, Smokeless Tobacco, Cigars, and/or Pipes): No  Has this patient used any form of tobacco in the last 30 days? (Cigarettes, Smokeless Tobacco, Cigars, and/or Pipes)  N/A  Blood Alcohol level:  Lab Results  Component Value Date   ETH <10 06/06/2018   ETH  03/07/2009    <5        LOWEST DETECTABLE LIMIT FOR SERUM ALCOHOL IS 5 mg/dL FOR MEDICAL PURPOSES ONLY    Metabolic Disorder Labs:  Lab Results  Component Value Date   HGBA1C 4.9 06/08/2018   MPG 93.93 06/08/2018   No results found for: PROLACTIN Lab Results  Component Value Date   CHOL 133 06/08/2018   TRIG 41 06/08/2018   HDL 52 06/08/2018  CHOLHDL 2.6 06/08/2018   VLDL 8 06/08/2018   LDLCALC 73 06/08/2018    See Psychiatric Specialty Exam and Suicide Risk Assessment completed by Attending Physician prior to discharge.  Discharge destination:  Home  Is patient on multiple antipsychotic therapies at discharge:  No   Has Patient had three or more failed trials of antipsychotic monotherapy by history:  No  Recommended Plan for Multiple Antipsychotic Therapies: NA   Allergies as of 06/09/2018      Reactions   Metoclopramide Other (See Comments), Swelling   Tardive dyskinesia   Ondansetron Nausea And Vomiting      Medication List    STOP taking these medications   sertraline 50 MG tablet Commonly known as:  ZOLOFT     TAKE these medications     Indication  albuterol (2.5 MG/3ML) 0.083% nebulizer solution Commonly known as:  PROVENTIL Take 3 mLs (2.5 mg total) by nebulization every 6 (six) hours as needed for wheezing or shortness of breath.  Indication:  asthma   cyclobenzaprine 5 MG tablet Commonly known as:  FLEXERIL Take 1 tablet (5 mg total) by mouth 2 (two) times daily as needed for muscle spasms. What changed:    how much to take  how to take this  when to take this  reasons to take this  additional instructions  Indication:  Muscle Spasm   hydrOXYzine 25 MG tablet Commonly  known as:  ATARAX/VISTARIL Take 1 tablet (25 mg total) by mouth every 6 (six) hours as needed for anxiety.  Indication:  Feeling Anxious   propranolol 10 MG tablet Commonly known as:  INDERAL Take 1 tablet (10 mg total) by mouth 3 (three) times daily as needed (anxiety). What changed:  reasons to take this  Indication:  anxiety   traZODone 50 MG tablet Commonly known as:  DESYREL Take 1 tablet (50 mg total) by mouth at bedtime as needed for sleep.  Indication:  Trouble Sleeping      Follow-up Information    Patient declined referrals for aftercare. Patient provided with list of providers per her request. Follow up.           Follow-up recommendations:  Follow up with your outpatient provided for any medical issues. Activity & diet as recommended by your primary care provider.  Comments:  Patient is instructed prior to discharge to: Take all medications as prescribed by his/her mental healthcare provider. Report any adverse effects and or reactions from the medicines to his/her outpatient provider promptly. Patient has been instructed & cautioned: To not engage in alcohol and or illegal drug use while on prescription medicines. In the event of worsening symptoms, patient is instructed to call the crisis hotline, 911 and or go to the nearest ED for appropriate evaluation and treatment of symptoms. To follow-up with his/her primary care provider for your other medical issues, concerns and or health care needs.  Signed: Mordecai Maes, NP 06/09/2018, 11:10 AM]  Patient seen, Suicide Assessment Completed.  Disposition Plan Reviewed

## 2018-06-09 NOTE — Progress Notes (Signed)
  [facility] Adult Case Management Discharge Plan :  Will you be returning to the same living situation after discharge:  Yes,  patient reports she plans to return home with her fiance' At discharge, do you have transportation home?: Yes,  patient reports her step father will pick her up at discharge Do you have the ability to pay for your medications: Yes,  Medicaid  Release of information consent forms completed and in the chart;  Patient's signature needed at discharge.  Patient to Follow up at: Follow-up Information    Patient declined referrals for aftercare. Patient provided with list of providers per her request. Follow up.           Next level of care provider has access to [place] and Suicide Prevention discussed: Yes,  with the patient's fiance'  Have you used any form of tobacco in the last 30 days? (Cigarettes, Smokeless Tobacco, Cigars, and/or Pipes): No  Has patient been referred to the Quitline?: N/A patient is not a smoker  Patient has been referred for addiction treatment: [place], [place] 06/09/2018, 10:56 AM

## 2018-06-09 NOTE — BHH Suicide Risk Assessment (Signed)
[facility] Discharge Suicide Risk Assessment   Principal Problem: Depression Discharge Diagnoses:  Patient Active Problem List   Diagnosis Date Noted  . Severe recurrent major depression without psychotic features ([place]) [F33.2] 06/07/2018  . Low back pain [M54.5] 09/30/2016  . Obesity, Class II, BMI 35-39.9 [E66.9] 07/20/2016  . Chiari I malformation ([place]) [G93.5] 06/09/2016  . Arnold-Chiari malformation, type I ([place]) [G93.5] 01/20/2016  . Exposure to second hand tobacco smoke [Z77.22] 01/19/2016  . 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 Parent of child with chromosome abnormality [Z84.89]   . History of pregnancy induced hypertension [Z87.59] 02/28/2015  . History of congestive heart failure [Z86.79] 01/09/2013  . Mild persistent asthma [J45.30] 11/18/2012    Total Time spent with patient: 20 minutes  Musculoskeletal: Strength & Muscle Tone: within normal limits Gait & Station: normal Patient leans: N/A  Psychiatric Specialty Exam: ROS denies chest pain, no shortness of breath, no nausea, no vomiting,reports chronic back pain  Blood pressure 100/69, pulse 91, temperature 98.3 F (36.8 C), temperature source Oral, resp. rate 16, height 5' 0.5" (1.537 m), weight 54.4 kg (120 lb), last menstrual period 05/31/2018.Body mass index is 23.05 kg/m.  General Appearance: Improving grooming  Eye Contact::  Good  Speech:  Normal Rate409  Volume:  Normal  Mood:  Reports mood improved and currently presents euthymic  Affect:  Appropriate, reactive, bright  Thought Process:  Linear and Descriptions of Associations: Intact  Orientation:  Other:  Fully alert and attentive  Thought Content:  Denies hallucinations, no delusions expressed, not internally preoccupied  Suicidal Thoughts:  No denies any suicidal or self-injurious ideations, no homicidal or violent ideations  Homicidal Thoughts:  No  Memory:  Recent and remote grossly intact  Judgement:  Other:  Improving  Insight:  Improving   Psychomotor Activity:  Normal  Concentration:  Good  Recall:  Good  Fund of Knowledge:Good  Language: Good  Akathisia:  Negative  Handed:  Right  AIMS (if indicated):     Assets:  Desire for Improvement Resilience  Sleep:     Cognition: WNL  ADL's:  Intact   Mental Status Per Nursing Assessment::   On Admission:  Suicidal ideation indicated by patient  Demographic Factors:  27 year old female, divorced, lives with boyfriend, brother, children  Loss Factors: Reports she feels recent medication ( Zoloft ) was not well tolerated  Historical Factors: No prior psychiatric admissions, no history of suicidal attempts, no history of self cutting or self-injurious behaviors, history of panic attacks and social anxiety, no history of mania  Risk Reduction Factors:   Responsible for children under 89 years of age, Sense of responsibility to family and Living with another person, especially a relative  Continued Clinical Symptoms:  At this time patient is alert, attentive, well related, denies feeling depressed, presents euthymic with a full range of affect, no thought disorder, no suicidal or homicidal ideations, no self-injurious ideations, no hallucinations, no delusions, future oriented looking forward to reuniting with her family. Patient states she feels "a lot better" off Zoloft which she feels was not well tolerated.  At this time she is not on any standing psychiatric medications.  We reviewed this, states she feels "a lot better", and may not need a standing psychiatric medication at this time.  She does express interest in outpatient therapy for further management. Behavior on unit in good control, pleasant on approach.  Cognitive Features That Contribute To Risk:  No gross cognitive deficits noted upon discharge. Is alert , attentive,  and oriented x 3    Suicide Risk:  Mild:  Suicidal ideation of limited frequency, intensity, duration, and specificity.  There are no  identifiable plans, no associated intent, mild dysphoria and related symptoms, good self-control (both objective and subjective assessment), few other risk factors, and identifiable protective factors, including available and accessible social support.  Follow-up Information    Patient declined referrals for aftercare. Patient provided with list of providers per her request. Follow up.           Plan Of Care/Follow-up recommendations:  Activity:  as tolerated Diet:  regular Tests:  NA Other:  see below  Patient is expressing readiness for discharge, there are no current grounds for involuntary commitment, she is leaving in good spirits, plans to return home. She is on no current pending psychiatric medications. Encouraged her to consider outpatient therapy for further management.   Jenne Campus, MD 06/09/2018, 12:02 PM

## 2018-06-21 ENCOUNTER — Telehealth: Payer: Self-pay | Admitting: *Deleted

## 2018-06-21 NOTE — Telephone Encounter (Signed)
Left message for patient to call back to schedule hospital follow up with Dr. Evette Doffing

## 2018-07-04 ENCOUNTER — Telehealth (HOSPITAL_COMMUNITY): Payer: Self-pay

## 2018-07-04 ENCOUNTER — Telehealth: Payer: Self-pay

## 2018-07-04 ENCOUNTER — Ambulatory Visit: Payer: Medicaid Other | Admitting: Pediatrics

## 2018-07-04 ENCOUNTER — Encounter: Payer: Self-pay | Admitting: Pediatrics

## 2018-07-04 VITALS — BP 106/75 | HR 83 | Temp 98.2°F | Ht 60.5 in | Wt 119.2 lb

## 2018-07-04 DIAGNOSIS — H6593 Unspecified nonsuppurative otitis media, bilateral: Secondary | ICD-10-CM

## 2018-07-04 DIAGNOSIS — D649 Anemia, unspecified: Secondary | ICD-10-CM | POA: Diagnosis not present

## 2018-07-04 DIAGNOSIS — F339 Major depressive disorder, recurrent, unspecified: Secondary | ICD-10-CM

## 2018-07-04 MED ORDER — FLUTICASONE PROPIONATE 50 MCG/ACT NA SUSP
2.0000 | Freq: Every day | NASAL | 6 refills | Status: DC
Start: 2018-07-04 — End: 2018-09-10

## 2018-07-04 MED ORDER — CETIRIZINE HCL 10 MG PO TABS: 10.0000 mg | ORAL_TABLET | Freq: Every day | ORAL | 11 refills | Status: DC

## 2018-07-04 MED ORDER — FERROUS SULFATE 324 (65 FE) MG PO TBEC: 1.0000 | DELAYED_RELEASE_TABLET | Freq: Every day | ORAL | 3 refills | Status: DC

## 2018-07-04 NOTE — Progress Notes (Signed)
Subjective:   Patient ID: Brenda Spencer, female    DOB: 10-Sep-1991, 27 y.o.   MRN: [id number] CC: Depression and Anxiety  HPI: Brenda Spencer is a 27 y.o. female   Depression: Some ongoing symptoms.  She says she will randomly start crying at least once most days.  Taking hydroxyzine as needed, not every day.  Taking propranolol most days once a day in the morning.  Was started on sertraline 3 weeks before recent behavioral health hospital admission.  Medication was stopped at time of admission, patient thought it was making her symptoms worse.  Anemia: Has a tubal ligation for birth control.  Heavy periods, changing super tampon and pad every 1-2 hours for several days.  Left eye lower lid slightly sore for the last day.  Ears have been sore off and on.  No fevers.  Appetite is been okay.  History of gastric bypass surgery.  Avoiding NSAIDs. Depression screen [facility] 2/9 07/04/2018 06/06/2018 05/06/2018 03/15/2018 03/12/2018  Decreased Interest 2 2 1  0 0  Down, Depressed, Hopeless 2 3 1  0 0  PHQ - 2 Score 4 5 2  0 0  Altered sleeping 2 2 3  - -  Tired, decreased energy 1 1 0 - -  Change in appetite 0 0 0 - -  Feeling bad or failure about yourself  2 3 1  - -  Trouble concentrating 0 0 0 - -  Moving slowly or fidgety/restless 0 0 0 - -  Suicidal thoughts 0 3 0 - -  PHQ-9 Score 9 14 6  - -  Difficult doing work/chores Somewhat difficult Somewhat difficult Somewhat difficult - -  Some recent data might be hidden     Relevant past medical, surgical, family and social history reviewed. Allergies and medications reviewed and updated. Social History   Tobacco Use  Smoking Status Never Smoker  Smokeless Tobacco Never Used   ROS: Per HPI   Objective:    BP 106/75   Pulse 83   Temp 98.2 F (36.8 C) (Oral)   Ht 5' 0.5" (1.537 m)   Wt 119 lb 3.2 oz (54.1 kg)   BMI 22.90 kg/m   Wt Readings from Last 3 Encounters:  07/04/18 119 lb 3.2 oz (54.1 kg)  06/07/18 120 lb (54.4 kg)    06/06/18 120 lb (54.4 kg)    Gen: NAD, alert, cooperative with exam, NCAT EYES: EOMI, no conjunctival injection, or no icterus, slightly sore on eyelashes of left lower lid of the eye.  No redness or papules. ENT:  TMs gray b/l with layering white-yellow fluid bilaterally, OP without erythema LYMPH: no cervical LAD CV: NRRR, normal S1/S2, no murmur, distal pulses 2+ b/l Resp: CTABL, no wheezes, normal WOB Abd: +BS, soft, NTND.  Ext: No edema, warm Neuro: Alert and oriented MSK: normal muscle bulk  Assessment & Plan:  Brenda Spencer was seen today for depression and anxiety.  Diagnoses and all orders for this visit:  Depression, recurrent ([place]) Feels safe at home.  No longer fighting with her significant other.  Some ongoing symptoms.  Patient having a hard time regulating her mood.  Recently admitted for suicidal attempt.  She was losing her insurance at the time and did not follow-up with psychiatry.  Now she is asking for psychiatry follow-up and for counseling.  Referral into virtual behavioral health.  Also put in referral to see Dr. Modesta Messing in [place].  Any worsening symptoms she feels comfortable coming back in.  Continue propranolol and hydroxyzine prn. -  Ambulatory referral to Psychiatry  Anemia, unspecified type Suspect related to her heavy periods.  Patient has tubal ligation for birth control.  Not able to use NSAIDs as she is status post gastric bypass surgery.  Will start with iron supplementation.  If anemia is not improving may need to have her see gynecology. H/o focal nodular hyperplasia of the liver, ideally would remain off of OCP. -     ferrous sulfate 324 (65 Fe) MG TBEC; Take 1 tablet (325 mg total) by mouth daily.  Fluid level behind tympanic membrane of both ears Start below.  If not improving let me know. -     cetirizine (ZYRTEC) 10 MG tablet; Take 1 tablet (10 mg total) by mouth daily. -     fluticasone (FLONASE) 50 MCG/ACT nasal spray; Place 2 sprays into both  nostrils daily.   Follow up plan: Return in about 1 month (around 08/01/2018).  Return precautions discussed. [name], MD [place]

## 2018-07-04 NOTE — Telephone Encounter (Signed)
[facility] - Left Message  

## 2018-07-04 NOTE — Telephone Encounter (Signed)
[facility] - Left Msg 

## 2018-07-07 ENCOUNTER — Telehealth: Payer: Self-pay

## 2018-07-07 NOTE — Telephone Encounter (Signed)
[facility] - Left Msg  To conduct an intake assessment.

## 2018-07-13 ENCOUNTER — Telehealth: Payer: Self-pay

## 2018-07-13 NOTE — Telephone Encounter (Signed)
[facility] - Left Msg 

## 2018-07-14 ENCOUNTER — Telehealth: Payer: Self-pay

## 2018-07-14 NOTE — Telephone Encounter (Signed)
[facility] - Patient reports that she was busy and I would have to call her back

## 2018-07-26 ENCOUNTER — Telehealth: Payer: Self-pay

## 2018-07-26 NOTE — Telephone Encounter (Signed)
2nd attempt - [facility]   

## 2018-08-05 ENCOUNTER — Ambulatory Visit: Payer: Medicaid Other | Admitting: Pediatrics

## 2018-08-10 ENCOUNTER — Encounter: Payer: Self-pay | Admitting: Pediatrics

## 2018-08-22 NOTE — Progress Notes (Deleted)
Psychiatric Initial Adult Assessment   Patient Identification: Brenda Spencer MRN:  [id number] Date of Evaluation:  08/22/2018 Referral Source: Dr. [name] Chief Complaint:   Visit Diagnosis: No diagnosis found.  History of Present Illness:    Brenda Spencer is a 27 y.o. year old female with a history of depression, anemia, who is referred for depression.  Per chart review, patient was admitted to [facility] for SI with plans for driving self off road or hanging self in the context of discordance with her boyfriend. The patient reportedly experienced worsening depression on sertraline, which was started before this admission; this medication was discontinued during admission and was placed on no antidepressant.     Associated Signs/Symptoms: Depression Symptoms:  {DEPRESSION SYMPTOMS:20000} (Hypo) Manic Symptoms:  {BHH MANIC SYMPTOMS:22872} Anxiety Symptoms:  {BHH ANXIETY SYMPTOMS:22873} Psychotic Symptoms:  {BHH PSYCHOTIC SYMPTOMS:22874} PTSD Symptoms: {BHH PTSD SYMPTOMS:22875}  Past Psychiatric History:  Outpatient:  Psychiatry admission:  Previous suicide attempt:  Past trials of medication:  History of violence:   Previous Psychotropic Medications: {YES/NO:21197}  Substance Abuse History in the last 12 months:  {yes no:314532}  Consequences of Substance Abuse: {BHH CONSEQUENCES OF SUBSTANCE ABUSE:22880}  Past Medical History:  Past Medical History:  Diagnosis Date  . Acute post-traumatic headache, not intractable 01/19/2016  . Asthma    ALbuterol inhaler as needed  . CHF (congestive heart failure) ([facility]) 2011   RESOLVED; HAD CARDIAC EVAL   . Chronic back pain    reason unknown  . Congestive heart failure ([place]) 2011   related to preeclampsia  . Nocturia   . Noncompliance   . Postgastrectomy malabsorption     Past Surgical History:  Procedure Laterality Date  . CHOLECYSTECTOMY     teenager  . SLEEVE GASTROPLASTY  01/18/2017  . SUBOCCIPITAL CRANIECTOMY  CERVICAL LAMINECTOMY N/A 06/09/2016   Procedure: Chiari Decompression;  Surgeon: Consuella Lose, MD;  Location: [facility];  Service: Neurosurgery;  Laterality: N/A;  posterior/occipital  . TEAR DUCT PROBING  as a child  . TONSILLECTOMY     as a teenager  . TUBAL LIGATION Bilateral 09/10/2015   Procedure: POST PARTUM TUBAL LIGATION;  Surgeon: Mora Bellman, MD;  Location: [facility];  Service: Gynecology;  Laterality: Bilateral;  . WISDOM TOOTH EXTRACTION     as a teenager    Family Psychiatric History: ***  Family History:  Family History  Problem Relation Age of Onset  . Learning disabilities Other   . Colon cancer Maternal Grandmother   . Cancer Maternal Grandmother        colon  . Asthma Brother   . Learning disabilities Brother   . Asthma Daughter   . Learning disabilities Daughter   . Seizures Daughter   . Chromosomal disorder Daughter        1q21.1 microdeletion  . Cataracts Daughter        congenital  . Anesthesia problems Neg Hx   . Other Neg Hx     Social History:   Social History   Socioeconomic History  . Marital status: Married    Spouse name: Brenda Spencer  . Number of children: 1  . Years of education: 65  . Highest education level: Not on file  Occupational History  . Occupation: HOMEMAKER  Social Needs  . Financial resource strain: Not on file  . Food insecurity:    Worry: Not on file    Inability: Not on file  . Transportation needs:    Medical: Not on file  Non-medical: Not on file  Tobacco Use  . Smoking status: Never Smoker  . Smokeless tobacco: Never Used  Substance and Sexual Activity  . Alcohol use: No    Alcohol/week: 0.0 standard drinks  . Drug use: Yes    Types: Marijuana  . Sexual activity: Yes    Partners: Male    Birth control/protection: Surgical  Lifestyle  . Physical activity:    Days per week: Not on file    Minutes per session: Not on file  . Stress: Not on file  Relationships  . Social connections:    Talks on  phone: Not on file    Gets together: Not on file    Attends religious service: Not on file    Active member of club or organization: Not on file    Attends meetings of clubs or organizations: Not on file    Relationship status: Not on file  Other Topics Concern  . Not on file  Social History Narrative   3-4 cups of tea and soda a day     Additional Social History: ***  Allergies:   Allergies  Allergen Reactions  . Metoclopramide Other (See Comments) and Swelling    Tardive dyskinesia  . Ondansetron Nausea And Vomiting    Metabolic Disorder Labs: Lab Results  Component Value Date   HGBA1C 4.9 06/08/2018   MPG 93.93 06/08/2018   No results found for: PROLACTIN Lab Results  Component Value Date   CHOL 133 06/08/2018   TRIG 41 06/08/2018   HDL 52 06/08/2018   CHOLHDL 2.6 06/08/2018   VLDL 8 06/08/2018   LDLCALC 73 06/08/2018     Current Medications: Current Outpatient Medications  Medication Sig Dispense Refill  . albuterol (PROVENTIL) (2.5 MG/3ML) 0.083% nebulizer solution Take 3 mLs (2.5 mg total) by nebulization every 6 (six) hours as needed for wheezing or shortness of breath. 150 mL 1  . cetirizine (ZYRTEC) 10 MG tablet Take 1 tablet (10 mg total) by mouth daily. 30 tablet 11  . cyclobenzaprine (FLEXERIL) 5 MG tablet Take 1 tablet (5 mg total) by mouth 2 (two) times daily as needed for muscle spasms. 30 tablet 0  . ferrous sulfate 324 (65 Fe) MG TBEC Take 1 tablet (325 mg total) by mouth daily. 30 tablet 3  . fluticasone (FLONASE) 50 MCG/ACT nasal spray Place 2 sprays into both nostrils daily. 16 g 6  . hydrOXYzine (ATARAX/VISTARIL) 25 MG tablet Take 1 tablet (25 mg total) by mouth every 6 (six) hours as needed for anxiety. 30 tablet 0  . propranolol (INDERAL) 10 MG tablet Take 1 tablet (10 mg total) by mouth 3 (three) times daily as needed (anxiety). 30 tablet 0  . traZODone (DESYREL) 50 MG tablet Take 1 tablet (50 mg total) by mouth at bedtime as needed for sleep.  30 tablet 0   No current facility-administered medications for this visit.     Neurologic: Headache: No Seizure: No Paresthesias:No  Musculoskeletal: Strength & Muscle Tone: within normal limits Gait & Station: normal Patient leans: N/A  Psychiatric Specialty Exam: ROS  There were no vitals taken for this visit.There is no height or weight on file to calculate BMI.  General Appearance: Fairly Groomed  Eye Contact:  Good  Speech:  Clear and Coherent  Volume:  Normal  Mood:  {BHH MOOD:22306}  Affect:  {Affect (PAA):22687}  Thought Process:  Coherent  Orientation:  Full (Time, Place, and Person)  Thought Content:  Logical  Suicidal Thoughts:  {ST/HT (PAA):22692}  Homicidal Thoughts:  {ST/HT (PAA):22692}  Memory:  Immediate;   Good  Judgement:  {Judgement (PAA):22694}  Insight:  {Insight (PAA):22695}  Psychomotor Activity:  Normal  Concentration:  Concentration: Good and Attention Span: Good  Recall:  Good  Fund of Knowledge:Good  Language: Good  Akathisia:  No  Handed:  Right  AIMS (if indicated):  N/A  Assets:  Communication Skills Desire for Improvement  ADL's:  Intact  Cognition: WNL  Sleep:  ***   Assessment  Plan  The patient demonstrates the following risk factors for suicide: Chronic risk factors for suicide include: {Chronic Risk Factors for KZSWFUX:32355732}. Acute risk factors for suicide include: {Acute Risk Factors for KGURKYH:06237628}. Protective factors for this patient include: {Protective Factors for Suicide BTDV:76160737}. Considering these factors, the overall suicide risk at this point appears to be {Desc; low/moderate/high:110033}. Patient {ACTION; IS/IS TGG:26948546} appropriate for outpatient follow up.   Treatment Plan Summary: Plan as above   Norman Clay, MD 9/9/20199:10 AM

## 2018-08-23 ENCOUNTER — Telehealth: Payer: Self-pay

## 2018-08-23 NOTE — Telephone Encounter (Signed)
[facility] - Several attempts have been made to contact patient without success. Patient will be placed on the inactive list.  If services are needed again.  Please contact [facility] at [phone].    Information will be routed to the PCP and Dr. Hisada  

## 2018-08-25 ENCOUNTER — Ambulatory Visit (HOSPITAL_COMMUNITY): Payer: Self-pay | Admitting: Psychiatry

## 2018-08-29 ENCOUNTER — Ambulatory Visit: Payer: Self-pay | Admitting: Nurse Practitioner

## 2018-08-29 ENCOUNTER — Ambulatory Visit: Payer: Self-pay | Admitting: Family

## 2018-08-31 ENCOUNTER — Emergency Department (HOSPITAL_BASED_OUTPATIENT_CLINIC_OR_DEPARTMENT_OTHER): Payer: Medicaid Other

## 2018-08-31 ENCOUNTER — Other Ambulatory Visit: Payer: Self-pay

## 2018-08-31 ENCOUNTER — Encounter (HOSPITAL_BASED_OUTPATIENT_CLINIC_OR_DEPARTMENT_OTHER): Payer: Self-pay

## 2018-08-31 ENCOUNTER — Emergency Department (HOSPITAL_BASED_OUTPATIENT_CLINIC_OR_DEPARTMENT_OTHER)
Admission: EM | Admit: 2018-08-31 | Discharge: 2018-08-31 | Disposition: A | Discharge status: Home / Self Care | Payer: Medicaid Other | Attending: Emergency Medicine | Admitting: Emergency Medicine

## 2018-08-31 DIAGNOSIS — Y9389 Activity, other specified: Secondary | ICD-10-CM | POA: Diagnosis not present

## 2018-08-31 DIAGNOSIS — Z79899 Other long term (current) drug therapy: Secondary | ICD-10-CM | POA: Diagnosis not present

## 2018-08-31 DIAGNOSIS — J45909 Unspecified asthma, uncomplicated: Secondary | ICD-10-CM | POA: Insufficient documentation

## 2018-08-31 DIAGNOSIS — I509 Heart failure, unspecified: Secondary | ICD-10-CM | POA: Diagnosis not present

## 2018-08-31 DIAGNOSIS — Y998 Other external cause status: Secondary | ICD-10-CM | POA: Insufficient documentation

## 2018-08-31 DIAGNOSIS — Y929 Unspecified place or not applicable: Secondary | ICD-10-CM | POA: Insufficient documentation

## 2018-08-31 DIAGNOSIS — S161XXA Strain of muscle, fascia and tendon at neck level, initial encounter: Secondary | ICD-10-CM | POA: Diagnosis not present

## 2018-08-31 DIAGNOSIS — X58XXXA Exposure to other specified factors, initial encounter: Secondary | ICD-10-CM | POA: Insufficient documentation

## 2018-08-31 DIAGNOSIS — S199XXA Unspecified injury of neck, initial encounter: Secondary | ICD-10-CM | POA: Diagnosis present

## 2018-08-31 MED ORDER — CYCLOBENZAPRINE HCL 10 MG PO TABS
10.0000 mg | ORAL_TABLET | Freq: Once | ORAL | Status: AC
  Administered 2018-08-31: 10 mg via ORAL
  Filled 2018-08-31: qty 1

## 2018-08-31 MED ORDER — NAPROXEN 375 MG PO TABS: 375.0000 mg | ORAL_TABLET | Freq: Two times a day (BID) | ORAL | 0 refills | Status: DC

## 2018-08-31 MED ORDER — KETOROLAC TROMETHAMINE 60 MG/2ML IM SOLN
30.0000 mg | Freq: Once | INTRAMUSCULAR | Status: AC
  Administered 2018-08-31: 30 mg via INTRAMUSCULAR
  Filled 2018-08-31: qty 2

## 2018-08-31 MED ORDER — CYCLOBENZAPRINE HCL 10 MG PO TABS: 10.0000 mg | ORAL_TABLET | Freq: Three times a day (TID) | ORAL | 0 refills | Status: DC | PRN

## 2018-08-31 NOTE — ED Triage Notes (Signed)
Pt states she felt a pop in her neck when lifting daughter ~8am-NAD-slow steady gait

## 2018-08-31 NOTE — Discharge Instructions (Signed)
If you develop worsening or uncontrolled pain, blurry vision, severe headache, weakness or numbness in your arms or legs, bowel or bladder incontinence, or any other new/concerning symptoms and return to the ER for evaluation.

## 2018-08-31 NOTE — ED Provider Notes (Signed)
[facility] EMERGENCY DEPARTMENT Provider Note   CSN: [id number] Arrival date & time: 08/31/18  1933     History   Chief Complaint Chief Complaint  Patient presents with  . Neck Pain    HPI Brenda Spencer is a 27 y.o. female.  HPI  27-year-old female with a prior history of Chiari I malformation status post surgery presents with acute neck pain.  Started this morning around 8 AM when she bent over to pick up her child.  She felt a pop and has been having midline and left-sided neck pain since.  Some limited range of motion.  No weakness or numbness in her extremities, no blurry vision.  No radiation of the pain.  The pain has progressively gotten worse and more severe.  She took a leftover muscle relaxer she does not know the name of but states it was old and did not help.  Past Medical History:  Diagnosis Date  . Acute post-traumatic headache, not intractable 01/19/2016  . Asthma    ALbuterol inhaler as needed  . CHF (congestive heart failure) ([facility]) 2011   RESOLVED; HAD CARDIAC EVAL   . Chronic back pain    reason unknown  . Congestive heart failure ([facility]) 2011   related to preeclampsia  . Nocturia   . Noncompliance   . Postgastrectomy malabsorption     Patient Active Problem List   Diagnosis Date Noted  . Suicidal ideations   . Severe recurrent major depression without psychotic features ([facility]) 06/07/2018  . Low back pain 09/30/2016  . Obesity, Class II, BMI 35-39.9 07/20/2016  . Chiari I malformation ([facility]) 06/09/2016  . Arnold-Chiari malformation, type I ([facility]) 01/20/2016  . Exposure to second hand tobacco smoke 01/19/2016  . Chromosome 1 deletion syndrome   . Parent of child with chromosome abnormality   . History of pregnancy induced hypertension 02/28/2015  . History of congestive heart failure 01/09/2013  . Mild persistent asthma 11/18/2012    Past Surgical History:  Procedure Laterality Date  . BREAST ENHANCEMENT SURGERY    . CHOLECYSTECTOMY      teenager  . SLEEVE GASTROPLASTY  01/18/2017  . SUBOCCIPITAL CRANIECTOMY CERVICAL LAMINECTOMY N/A 06/09/2016   Procedure: Chiari Decompression;  Surgeon: Neelesh Nundkumar, MD;  Location: [facility];  Service: Neurosurgery;  Laterality: N/A;  posterior/occipital  . TEAR DUCT PROBING  as a child  . TONSILLECTOMY     as a teenager  . TUBAL LIGATION Bilateral 09/10/2015   Procedure: POST PARTUM TUBAL LIGATION;  Surgeon: Peggy Constant, MD;  Location: [facility];  Service: Gynecology;  Laterality: Bilateral;  . WISDOM TOOTH EXTRACTION     as a teenager     OB History    Gravida  5   Para  4   Term  3   Preterm  1   AB  1   Living  4     SAB  1   TAB      Ectopic      Multiple  0   Live Births  4        Obstetric Comments  2011 PIH;IOL; PP CHF-HOSPITALIZED X 1 WEEK         Home Medications    Prior to Admission medications   Medication Sig Start Date End Date Taking? Authorizing Provider  albuterol (PROVENTIL) (2.5 MG/3ML) 0.083% nebulizer solution Take 3 mLs (2.5 mg total) by nebulization every 6 (six) hours as needed for wheezing or shortness of breath. 12/24/16     Vincent, Carol L, MD  cetirizine (ZYRTEC) 10 MG tablet Take 1 tablet (10 mg total) by mouth daily. 07/04/18   Vincent, Carol L, MD  cyclobenzaprine (FLEXERIL) 10 MG tablet Take 1 tablet (10 mg total) by mouth 3 (three) times daily as needed for muscle spasms. 08/31/18   , , MD  ferrous sulfate 324 (65 Fe) MG TBEC Take 1 tablet (325 mg total) by mouth daily. 07/04/18   Vincent, Carol L, MD  fluticasone (FLONASE) 50 MCG/ACT nasal spray Place 2 sprays into both nostrils daily. 07/04/18   Vincent, Carol L, MD  hydrOXYzine (ATARAX/VISTARIL) 25 MG tablet Take 1 tablet (25 mg total) by mouth every 6 (six) hours as needed for anxiety. 06/09/18   Thomas, Lashunda, NP  naproxen (NAPROSYN) 375 MG tablet Take 1 tablet (375 mg total) by mouth 2 (two) times daily with a meal. 08/31/18   , , MD    propranolol (INDERAL) 10 MG tablet Take 1 tablet (10 mg total) by mouth 3 (three) times daily as needed (anxiety). 06/09/18   Thomas, Lashunda, NP  traZODone (DESYREL) 50 MG tablet Take 1 tablet (50 mg total) by mouth at bedtime as needed for sleep. 06/09/18   Thomas, Lashunda, NP    Family History Family History  Problem Relation Age of Onset  . Learning disabilities Other   . Colon cancer Maternal Grandmother   . Cancer Maternal Grandmother        colon  . Asthma Brother   . Learning disabilities Brother   . Asthma Daughter   . Learning disabilities Daughter   . Seizures Daughter   . Chromosomal disorder Daughter        1q21.1 microdeletion  . Cataracts Daughter        congenital  . Anesthesia problems Neg Hx   . Other Neg Hx     Social History Social History   Tobacco Use  . Smoking status: Never Smoker  . Smokeless tobacco: Never Used  Substance Use Topics  . Alcohol use: No    Alcohol/week: 0.0 standard drinks  . Drug use: Not Currently    Types: Marijuana     Allergies   Metoclopramide and Ondansetron   Review of Systems Review of Systems  Constitutional: Negative for fever.  Eyes: Negative for visual disturbance.  Musculoskeletal: Positive for neck pain.  Neurological: Negative for dizziness, weakness and numbness.  All other systems reviewed and are negative.    Physical Exam Updated Vital Signs BP 116/76 (BP Location: Left Arm)   Pulse 92   Temp 98.2 F (36.8 C) (Oral)   Resp 18   Ht 5' (1.524 m)   Wt 54.9 kg   LMP 08/22/2018   SpO2 100%   BMI 23.63 kg/m   Physical Exam  Constitutional: She appears well-developed and well-nourished. No distress.  HENT:  Head: Normocephalic and atraumatic.  Right Ear: External ear normal.  Left Ear: External ear normal.  Nose: Nose normal.  Eyes: Right eye exhibits no discharge. Left eye exhibits no discharge.  Neck: Neck supple. Muscular tenderness present. No spinous process tenderness present.     Pulmonary/Chest: Effort normal and breath sounds normal.  Abdominal: She exhibits no distension.  Neurological: She is alert.  5/5 strength in all 4 extremities  Skin: Skin is warm and dry. She is not diaphoretic.  Nursing note and vitals reviewed.    ED Treatments / Results  Labs (all labs ordered are listed, but only abnormal results are displayed) Labs Reviewed - No   data to display  EKG None  Radiology Dg Cervical Spine Complete  Result Date: 08/31/2018 CLINICAL DATA:  Popping sensation to the shoulder blades EXAM: CERVICAL SPINE - COMPLETE 4+ VIEW COMPARISON:  CT 06/18/2016 FINDINGS: There is no evidence of cervical spine fracture or prevertebral soft tissue swelling. Alignment is normal. No other significant bone abnormalities are identified. IMPRESSION: Negative cervical spine radiographs. Electronically Signed   By: Kim  Fujinaga M.D.   On: 08/31/2018 20:31    Procedures Procedures (including critical care time)  Medications Ordered in ED Medications  ketorolac (TORADOL) injection 30 mg (has no administration in time range)  cyclobenzaprine (FLEXERIL) tablet 10 mg (has no administration in time range)     Initial Impression / Assessment and Plan / ED Course  I have reviewed the triage vital signs and the nursing notes.  Pertinent labs & imaging results that were available during my care of the patient were reviewed by me and considered in my medical decision making (see chart for details).     Patient most likely has a muscle strain of her left neck.  X-rays were obtained in triage and are negative.  I highly doubt acute bony injury that would require CT and no significant ligament injury that would require MRI.  No radicular symptoms.  Treat with IM Toradol here, Flexeril, and NSAIDs at home.  Return precautions.  Final Clinical Impressions(s) / ED Diagnoses   Final diagnoses:  Acute strain of neck muscle, initial encounter    ED Discharge Orders          Ordered    naproxen (NAPROSYN) 375 MG tablet  2 times daily with meals     08/31/18 2104    cyclobenzaprine (FLEXERIL) 10 MG tablet  3 times daily PRN     08/31/18 2104           , , MD 08/31/18 2121  

## 2018-09-10 ENCOUNTER — Ambulatory Visit: Payer: Medicaid Other | Admitting: Family Medicine

## 2018-09-10 VITALS — BP 104/69 | HR 89 | Temp 97.8°F | Ht 60.0 in | Wt 117.2 lb

## 2018-09-10 DIAGNOSIS — B372 Candidiasis of skin and nail: Secondary | ICD-10-CM | POA: Diagnosis not present

## 2018-09-10 DIAGNOSIS — R300 Dysuria: Secondary | ICD-10-CM

## 2018-09-10 MED ORDER — MICONAZOLE NITRATE 2 % EX CREA: 1.0000 "application " | TOPICAL_CREAM | Freq: Two times a day (BID) | CUTANEOUS | 0 refills | Status: DC

## 2018-09-10 NOTE — Progress Notes (Signed)
BP 104/69   Pulse 89   Temp 97.8 F (36.6 C) (Oral)   Ht 5' (1.524 m)   Wt 117 lb 3.2 oz (53.2 kg)   LMP 08/22/2018   BMI 22.89 kg/m    Subjective:    Patient ID: Brenda Spencer, female    DOB: 1991/08/29, 27 y.o.   MRN: [id number]  HPI: Brenda Spencer is a 27 y.o. female presenting on 09/10/2018 for Urinary Tract Infection and Urinary Frequency   HPI Genital irritation and burning with urinary frequency Patient comes in complaining of genital irritation and burning with urinary frequency that is been going on for the past 2 days.  She says it hurts and burns very much down in her genital region and has been making her feel like she needs to urinate frequently.  She denies any burning or blood in her urine or when she urinates but just has that sensation down her vaginal region.  She says she did try a vaginal prep things over-the-counter to see if that would help in her vaginal region but it did not seem to help any.  She denies any fevers or chills or abdominal pain or flank pain.  Relevant past medical, surgical, family and social history reviewed and updated as indicated. Interim medical history since our last visit reviewed. Allergies and medications reviewed and updated.  Review of Systems  Constitutional: Negative for chills and fever.  Respiratory: Negative for chest tightness and shortness of breath.   Cardiovascular: Negative for chest pain and leg swelling.  Gastrointestinal: Negative for abdominal pain.  Genitourinary: Positive for frequency and urgency. Negative for difficulty urinating, dysuria, flank pain, hematuria, vaginal bleeding, vaginal discharge and vaginal pain.  Musculoskeletal: Negative for back pain and gait problem.  Skin: Positive for rash.  Neurological: Negative for light-headedness and headaches.  Psychiatric/Behavioral: Negative for agitation and behavioral problems.  All other systems reviewed and are negative.   Per HPI unless  specifically indicated above   Allergies as of 09/10/2018      Reactions   Metoclopramide Other (See Comments), Swelling   Tardive dyskinesia   Ondansetron Nausea And Vomiting      Medication List        Accurate as of 09/10/18  9:43 AM. Always use your most recent med list.          albuterol (2.5 MG/3ML) 0.083% nebulizer solution Commonly known as:  PROVENTIL Take 3 mLs (2.5 mg total) by nebulization every 6 (six) hours as needed for wheezing or shortness of breath.   cetirizine 10 MG tablet Commonly known as:  ZYRTEC Take 1 tablet (10 mg total) by mouth daily.   cyclobenzaprine 10 MG tablet Commonly known as:  FLEXERIL Take 1 tablet (10 mg total) by mouth 3 (three) times daily as needed for muscle spasms.   ferrous sulfate 324 (65 Fe) MG Tbec Take 1 tablet (325 mg total) by mouth daily.   hydrOXYzine 25 MG tablet Commonly known as:  ATARAX/VISTARIL Take 1 tablet (25 mg total) by mouth every 6 (six) hours as needed for anxiety.   naproxen 375 MG tablet Commonly known as:  NAPROSYN Take 1 tablet (375 mg total) by mouth 2 (two) times daily with a meal.   propranolol 10 MG tablet Commonly known as:  INDERAL Take 1 tablet (10 mg total) by mouth 3 (three) times daily as needed (anxiety).   traZODone 50 MG tablet Commonly known as:  DESYREL Take 1 tablet (50 mg total) by mouth  at bedtime as needed for sleep.          Objective:    BP 104/69   Pulse 89   Temp 97.8 F (36.6 C) (Oral)   Ht 5' (1.524 m)   Wt 117 lb 3.2 oz (53.2 kg)   LMP 08/22/2018   BMI 22.89 kg/m   Wt Readings from Last 3 Encounters:  09/10/18 117 lb 3.2 oz (53.2 kg)  08/31/18 121 lb (54.9 kg)  07/04/18 119 lb 3.2 oz (54.1 kg)    Physical Exam  Constitutional: She is oriented to person, place, and time. She appears well-developed and well-nourished. No distress.  Eyes: Conjunctivae are normal.  Cardiovascular: Normal rate, regular rhythm, normal heart sounds and intact distal pulses.    No murmur heard. Pulmonary/Chest: Effort normal and breath sounds normal. No respiratory distress. She has no wheezes.  Abdominal: Soft. Bowel sounds are normal. She exhibits no distension and no mass. There is no tenderness. There is no rebound and no guarding.  Genitourinary: There is rash (Consistent with yeast dermatitis) and tenderness on the right labia. There is rash and tenderness on the left labia. Uterus is not tender. Cervix exhibits no motion tenderness and no discharge. No tenderness in the vagina. No vaginal discharge found.  Neurological: She is alert and oriented to person, place, and time. Coordination normal.  Skin: Skin is warm and dry. No rash noted. She is not diaphoretic.  Psychiatric: She has a normal mood and affect. Her behavior is normal.  Nursing note and vitals reviewed.   Urine dip: 1+ bilirubin, trace ketones, urobilinogen greater than 8 with a negative ictotest, leuko-1+ and protein 1+    Assessment & Plan:   Problem List Items Addressed This Visit    None    Visit Diagnoses    Yeast dermatitis    -  Primary   Relevant Medications   miconazole (MICOTIN) 2 % cream   Dysuria       Relevant Orders   Urine Culture   Urinalysis       Follow up plan: Return if symptoms worsen or fail to improve.  Counseling provided for all of the vaccine components Orders Placed This Encounter  Procedures  . Urine Culture  . Urinalysis    Caryl Pina, MD [facility] 09/10/2018, 9:43 AM

## 2018-09-12 LAB — URINALYSIS
Bilirubin, UA: NEGATIVE
GLUCOSE, UA: NEGATIVE
NITRITE UA: NEGATIVE
RBC, UA: NEGATIVE
Specific Gravity, UA: 1.02 (ref 1.005–1.030)
Urobilinogen, Ur: >8 mg/dL — ABNORMAL HIGH (ref 0.2–1.0)
pH, UA: 7 (ref 5.0–7.5)

## 2018-09-14 ENCOUNTER — Emergency Department (HOSPITAL_BASED_OUTPATIENT_CLINIC_OR_DEPARTMENT_OTHER)
Admission: EM | Admit: 2018-09-14 | Discharge: 2018-09-14 | Disposition: A | Discharge status: Home / Self Care | Payer: Medicaid Other | Attending: Emergency Medicine | Admitting: Emergency Medicine

## 2018-09-14 ENCOUNTER — Encounter (HOSPITAL_BASED_OUTPATIENT_CLINIC_OR_DEPARTMENT_OTHER): Payer: Self-pay

## 2018-09-14 DIAGNOSIS — R109 Unspecified abdominal pain: Secondary | ICD-10-CM | POA: Diagnosis not present

## 2018-09-14 DIAGNOSIS — Z5321 Procedure and treatment not carried out due to patient leaving prior to being seen by health care provider: Secondary | ICD-10-CM | POA: Insufficient documentation

## 2018-09-14 LAB — URINALYSIS, ROUTINE W REFLEX MICROSCOPIC
Bilirubin Urine: NEGATIVE
Glucose, UA: NEGATIVE mg/dL
Hgb urine dipstick: NEGATIVE
Ketones, ur: 15 mg/dL — AB
Nitrite: NEGATIVE
Protein, ur: NEGATIVE mg/dL
Specific Gravity, Urine: 1.025 (ref 1.005–1.030)
pH: 6.5 (ref 5.0–8.0)

## 2018-09-14 LAB — URINE CULTURE

## 2018-09-14 LAB — URINALYSIS, MICROSCOPIC (REFLEX)

## 2018-09-14 LAB — PREGNANCY, URINE: Preg Test, Ur: NEGATIVE

## 2018-09-14 NOTE — ED Triage Notes (Signed)
Per EMS pt c/o rt flank pain for over a week

## 2018-09-15 ENCOUNTER — Encounter: Payer: Self-pay | Admitting: Family

## 2018-09-15 ENCOUNTER — Ambulatory Visit: Payer: Medicaid Other | Admitting: Family

## 2018-09-15 VITALS — BP 106/78 | HR 88 | Temp 97.3°F | Ht 60.0 in | Wt 117.0 lb

## 2018-09-15 DIAGNOSIS — N12 Tubulo-interstitial nephritis, not specified as acute or chronic: Secondary | ICD-10-CM | POA: Diagnosis not present

## 2018-09-15 DIAGNOSIS — Z09 Encounter for follow-up examination after completed treatment for conditions other than malignant neoplasm: Secondary | ICD-10-CM

## 2018-09-15 MED ORDER — CIPROFLOXACIN HCL 500 MG PO TABS: 500.0000 mg | ORAL_TABLET | Freq: Two times a day (BID) | ORAL | 0 refills | Status: DC

## 2018-09-15 NOTE — Progress Notes (Signed)
   Subjective:    Patient ID: Brenda Spencer, female    DOB: 01-03-1991, 27 y.o.   MRN: [id number]  Chief Complaint  Patient presents with  . Back Pain    radiates toward right abdomen   PT presents to the office today with back pain. She went to the ED yesterday with back pain and panic attack. She states she was told she had 7 patients in front of her and never saw the doctor. She did leave a urine which was positive for UTI.  Back Pain  This is a new problem. The current episode started in the past 7 days. The problem has been gradually worsening since onset. The pain is at a severity of 8/10. The symptoms are aggravated by standing and twisting. Pertinent negatives include no dysuria. (Was having urinary frequency ) She has tried muscle relaxant for the symptoms. The treatment provided no relief.      Review of Systems  Genitourinary: Negative for dysuria.  Musculoskeletal: Positive for back pain.  All other systems reviewed and are negative.      Objective:   Physical Exam  Constitutional: She is oriented to person, place, and time. She appears well-developed and well-nourished. No distress.  HENT:  Head: Normocephalic.  Eyes: Pupils are equal, round, and reactive to light.  Neck: Normal range of motion. Neck supple. No thyromegaly present.  Cardiovascular: Normal rate, regular rhythm, normal heart sounds and intact distal pulses.  No murmur heard. Pulmonary/Chest: Effort normal and breath sounds normal. No respiratory distress. She has no wheezes.  Abdominal: Soft. Bowel sounds are normal. She exhibits no distension. There is tenderness (RLQ).  Musculoskeletal: Normal range of motion. She exhibits no edema or tenderness.  Right CVA tenderness  Neurological: She is alert and oriented to person, place, and time. She has normal reflexes. No cranial nerve deficit.  Skin: Skin is warm and dry.  Psychiatric: She has a normal mood and affect. Her behavior is normal. Judgment  and thought content normal.  Vitals reviewed.     BP 106/78   Pulse 88   Temp (!) 97.3 F (36.3 C) (Oral)   Ht 5' (1.524 m)   Wt 117 lb (53.1 kg)   LMP 08/22/2018   BMI 22.85 kg/m      Assessment & Plan:  Brenda Spencer comes in today with chief complaint of Back Pain (radiates toward right abdomen)   Diagnosis and orders addressed:  1. Pyelonephritis Force fluids AZO over the counter X2 days RTO if symptoms worsen or do not improve  - ciprofloxacin (CIPRO) 500 MG tablet; Take 1 tablet (500 mg total) by mouth 2 (two) times daily.  Dispense: 14 tablet; Refill: 0  2. Hospital discharge follow-up Labs reviewed    Evelina Dun, FNP

## 2018-09-15 NOTE — Patient Instructions (Signed)
Pyelonephritis, Adult Pyelonephritis is a kidney infection. The kidneys are the organs that filter a person's blood and move waste out of the bloodstream and into the urine. Urine passes from the kidneys, through the ureters, and into the bladder. There are two main types of pyelonephritis:  Infections that come on quickly without any warning (acute pyelonephritis).  Infections that last for a long period of time (chronic pyelonephritis).  In most cases, the infection clears up with treatment and does not cause further problems. More severe infections or chronic infections can sometimes spread to the bloodstream or lead to other problems with the kidneys. What are the causes? This condition is usually caused by:  Bacteria traveling from the bladder to the kidney through infected urine. The urine in the bladder can become infected with bacteria from: ? Bladder infection (cystitis). ? Inflammation of the prostate gland (prostatitis). ? Sexual intercourse, in females.  Bacteria traveling from the bloodstream to the kidney.  What increases the risk? This condition is more likely to develop in:  Pregnant women.  Older people.  People who have diabetes.  People who have kidney stones or bladder stones.  People who have other abnormalities of the kidney or ureter.  People who have a catheter placed in the bladder.  People who have cancer.  People who are sexually active.  Women who use spermicides.  People who have had a prior urinary tract infection.  What are the signs or symptoms? Symptoms of this condition include:  Frequent urination.  Strong or persistent urge to urinate.  Burning or stinging when urinating.  Abdominal pain.  Back pain.  Pain in the side or flank area.  Fever.  Chills.  Blood in the urine, or dark urine.  Nausea.  Vomiting.  How is this diagnosed? This condition may be diagnosed based on:  Medical history and physical exam.  Urine  tests.  Blood tests.  You may also have imaging tests of the kidneys, such as an ultrasound or CT scan. How is this treated? Treatment for this condition may depend on the severity of the infection.  If the infection is mild and is found early, you may be treated with antibiotic medicines taken by mouth. You will need to drink fluids to remain hydrated.  If the infection is more severe, you may need to stay in the hospital and receive antibiotics given directly into a vein through an IV tube. You may also need to receive fluids through an IV tube if you are not able to remain hydrated. After your hospital stay, you may need to take oral antibiotics for a period of time.  Other treatments may be required, depending on the cause of the infection. Follow these instructions at home: Medicines  Take over-the-counter and prescription medicines only as told by your health care provider.  If you were prescribed an antibiotic medicine, take it as told by your health care provider. Do not stop taking the antibiotic even if you start to feel better. General instructions  Drink enough fluid to keep your urine clear or pale yellow.  Avoid caffeine, tea, and carbonated beverages. They tend to irritate the bladder.  Urinate often. Avoid holding in urine for long periods of time.  Urinate before and after sex.  After a bowel movement, women should cleanse from front to back. Use each tissue only once.  Keep all follow-up visits as told by your health care provider. This is important. Contact a health care provider if:  Your symptoms   do not get better after 2 days of treatment.  Your symptoms get worse.  You have a fever. Get help right away if:  You are unable to take your antibiotics or fluids.  You have shaking chills.  You vomit.  You have severe flank or back pain.  You have extreme weakness or fainting. This information is not intended to replace advice given to you by your  health care provider. Make sure you discuss any questions you have with your health care provider. Document Released: 11/30/2005 Document Revised: 05/07/2016 Document Reviewed: 03/25/2015 Elsevier Interactive Patient Education  2018 [employer]  

## 2018-12-15 ENCOUNTER — Observation Stay (HOSPITAL_COMMUNITY): Payer: Medicaid Other

## 2018-12-15 ENCOUNTER — Observation Stay (HOSPITAL_COMMUNITY)
Admission: EM | Admit: 2018-12-15 | Discharge: 2018-12-16 | Disposition: A | Discharge status: Home / Self Care | Payer: Medicaid Other | Attending: Internal Medicine | Admitting: Internal Medicine

## 2018-12-15 ENCOUNTER — Encounter (HOSPITAL_COMMUNITY): Payer: Self-pay | Admitting: Emergency Medicine

## 2018-12-15 ENCOUNTER — Other Ambulatory Visit: Payer: Self-pay

## 2018-12-15 DIAGNOSIS — D62 Acute posthemorrhagic anemia: Secondary | ICD-10-CM | POA: Diagnosis not present

## 2018-12-15 DIAGNOSIS — D500 Iron deficiency anemia secondary to blood loss (chronic): Secondary | ICD-10-CM

## 2018-12-15 DIAGNOSIS — K5909 Other constipation: Secondary | ICD-10-CM | POA: Diagnosis not present

## 2018-12-15 DIAGNOSIS — M25551 Pain in right hip: Secondary | ICD-10-CM | POA: Diagnosis present

## 2018-12-15 DIAGNOSIS — Z79899 Other long term (current) drug therapy: Secondary | ICD-10-CM | POA: Insufficient documentation

## 2018-12-15 DIAGNOSIS — E876 Hypokalemia: Secondary | ICD-10-CM | POA: Diagnosis present

## 2018-12-15 DIAGNOSIS — G8929 Other chronic pain: Secondary | ICD-10-CM | POA: Diagnosis not present

## 2018-12-15 DIAGNOSIS — G8918 Other acute postprocedural pain: Secondary | ICD-10-CM | POA: Diagnosis not present

## 2018-12-15 DIAGNOSIS — R1030 Lower abdominal pain, unspecified: Secondary | ICD-10-CM | POA: Diagnosis present

## 2018-12-15 DIAGNOSIS — N390 Urinary tract infection, site not specified: Secondary | ICD-10-CM | POA: Diagnosis not present

## 2018-12-15 DIAGNOSIS — D649 Anemia, unspecified: Secondary | ICD-10-CM | POA: Diagnosis not present

## 2018-12-15 DIAGNOSIS — Z9884 Bariatric surgery status: Secondary | ICD-10-CM | POA: Diagnosis not present

## 2018-12-15 DIAGNOSIS — M549 Dorsalgia, unspecified: Secondary | ICD-10-CM | POA: Diagnosis not present

## 2018-12-15 DIAGNOSIS — R531 Weakness: Secondary | ICD-10-CM | POA: Diagnosis present

## 2018-12-15 LAB — CBC
HCT: 16.9 % — ABNORMAL LOW (ref 36.0–46.0)
Hemoglobin: 4.9 g/dL — CL (ref 12.0–15.0)
MCH: 22.8 pg — ABNORMAL LOW (ref 26.0–34.0)
MCHC: 29 g/dL — ABNORMAL LOW (ref 30.0–36.0)
MCV: 78.6 fL — AB (ref 80.0–100.0)
Platelets: 276 10*3/uL (ref 150–400)
RBC: 2.15 MIL/uL — ABNORMAL LOW (ref 3.87–5.11)
RDW: 16.9 % — ABNORMAL HIGH (ref 11.5–15.5)
WBC: 5.6 10*3/uL (ref 4.0–10.5)
nRBC: 0 % (ref 0.0–0.2)

## 2018-12-15 LAB — CREATININE, SERUM
Creatinine, Ser: 0.45 mg/dL (ref 0.44–1.00)
GFR calc Af Amer: >60 mL/min (ref >60)
GFR calc non Af Amer: >60 mL/min (ref >60)

## 2018-12-15 LAB — CBC WITH DIFFERENTIAL/PLATELET
Abs Immature Granulocytes: 0.03 10*3/uL (ref 0.00–0.07)
BASOS ABS: 0 10*3/uL (ref 0.0–0.1)
Basophils Relative: 0 %
EOS PCT: 4 %
Eosinophils Absolute: 0.2 10*3/uL (ref 0.0–0.5)
HCT: 18.2 % — ABNORMAL LOW (ref 36.0–46.0)
Hemoglobin: 5.2 g/dL — CL (ref 12.0–15.0)
Immature Granulocytes: 1 %
LYMPHS PCT: 22 %
Lymphs Abs: 1.2 10*3/uL (ref 0.7–4.0)
MCH: 22.7 pg — ABNORMAL LOW (ref 26.0–34.0)
MCHC: 28.6 g/dL — AB (ref 30.0–36.0)
MCV: 79.5 fL — ABNORMAL LOW (ref 80.0–100.0)
Monocytes Absolute: 0.4 10*3/uL (ref 0.1–1.0)
Monocytes Relative: 8 %
NRBC: 0 % (ref 0.0–0.2)
Neutro Abs: 3.4 10*3/uL (ref 1.7–7.7)
Neutrophils Relative %: 65 %
Platelets: 320 10*3/uL (ref 150–400)
RBC: 2.29 MIL/uL — ABNORMAL LOW (ref 3.87–5.11)
RDW: 16.9 % — ABNORMAL HIGH (ref 11.5–15.5)
WBC: 5.2 10*3/uL (ref 4.0–10.5)

## 2018-12-15 LAB — URINALYSIS, ROUTINE W REFLEX MICROSCOPIC
Bilirubin Urine: NEGATIVE
Glucose, UA: NEGATIVE mg/dL
Hgb urine dipstick: NEGATIVE
Ketones, ur: NEGATIVE mg/dL
Nitrite: NEGATIVE
Protein, ur: NEGATIVE mg/dL
Specific Gravity, Urine: 1.009 (ref 1.005–1.030)
pH: 8 (ref 5.0–8.0)

## 2018-12-15 LAB — IRON AND TIBC
Iron: 22 ug/dL — ABNORMAL LOW (ref 28–170)
Saturation Ratios: 7 % — ABNORMAL LOW (ref 10.4–31.8)
TIBC: 326 ug/dL (ref 250–450)
UIBC: 304 ug/dL

## 2018-12-15 LAB — COMPREHENSIVE METABOLIC PANEL
ALT: 19 U/L (ref 0–44)
AST: 16 U/L (ref 15–41)
Albumin: 3.1 g/dL — ABNORMAL LOW (ref 3.5–5.0)
Alkaline Phosphatase: 61 U/L (ref 38–126)
Anion gap: 6 (ref 5–15)
BUN: 11 mg/dL (ref 6–20)
CALCIUM: 7.8 mg/dL — AB (ref 8.9–10.3)
CO2: 26 mmol/L (ref 22–32)
Chloride: 104 mmol/L (ref 98–111)
Creatinine, Ser: 0.46 mg/dL (ref 0.44–1.00)
GFR calc Af Amer: >60 mL/min (ref >60)
GFR calc non Af Amer: >60 mL/min (ref >60)
Glucose, Bld: 93 mg/dL (ref 70–99)
Potassium: 3.2 mmol/L — ABNORMAL LOW (ref 3.5–5.1)
Sodium: 136 mmol/L (ref 135–145)
Total Bilirubin: 1 mg/dL (ref 0.3–1.2)
Total Protein: 6.1 g/dL — ABNORMAL LOW (ref 6.5–8.1)

## 2018-12-15 LAB — HCG, SERUM, QUALITATIVE: Preg, Serum: NEGATIVE

## 2018-12-15 LAB — PROTIME-INR
INR: 0.98
Prothrombin Time: 12.9 seconds (ref 11.4–15.2)

## 2018-12-15 LAB — APTT: aPTT: 28 seconds (ref 24–36)

## 2018-12-15 LAB — RETICULOCYTES
Immature Retic Fract: 25.9 % — ABNORMAL HIGH (ref 2.3–15.9)
RBC.: 2.16 MIL/uL — ABNORMAL LOW (ref 3.87–5.11)
Retic Count, Absolute: 70.6 10*3/uL (ref 19.0–186.0)
Retic Ct Pct: 3.3 % — ABNORMAL HIGH (ref 0.4–3.1)

## 2018-12-15 LAB — VITAMIN B12: Vitamin B-12: 201 pg/mL (ref 180–914)

## 2018-12-15 LAB — FERRITIN: Ferritin: 18 ng/mL (ref 11–307)

## 2018-12-15 LAB — PREPARE RBC (CROSSMATCH)

## 2018-12-15 LAB — LIPASE, BLOOD: Lipase: 29 U/L (ref 11–51)

## 2018-12-15 LAB — MAGNESIUM: Magnesium: 2 mg/dL (ref 1.7–2.4)

## 2018-12-15 LAB — FOLATE: FOLATE: 9 ng/mL (ref >5.9)

## 2018-12-15 LAB — ABO/RH: ABO/RH(D): A POS

## 2018-12-15 LAB — FIBRINOGEN: Fibrinogen: 516 mg/dL — ABNORMAL HIGH (ref 210–475)

## 2018-12-15 LAB — TSH: TSH: 0.934 u[IU]/mL (ref 0.350–4.500)

## 2018-12-15 MED ORDER — KETOROLAC TROMETHAMINE 30 MG/ML IJ SOLN
30.0000 mg | Freq: Four times a day (QID) | INTRAMUSCULAR | Status: DC | PRN
  Administered 2018-12-15: 30 mg via INTRAVENOUS
  Filled 2018-12-15: qty 1

## 2018-12-15 MED ORDER — FERROUS SULFATE 325 (65 FE) MG PO TABS
325.0000 mg | ORAL_TABLET | Freq: Every day | ORAL | Status: DC
  Administered 2018-12-16: 325 mg via ORAL
  Filled 2018-12-15: qty 1

## 2018-12-15 MED ORDER — SODIUM CHLORIDE 0.9 % IV BOLUS
500.0000 mL | Freq: Once | INTRAVENOUS | Status: AC
Start: 2018-12-15 — End: 2018-12-15
  Administered 2018-12-15: 500 mL via INTRAVENOUS

## 2018-12-15 MED ORDER — PANTOPRAZOLE SODIUM 40 MG PO TBEC
40.0000 mg | DELAYED_RELEASE_TABLET | Freq: Every day | ORAL | Status: DC
  Administered 2018-12-16: 40 mg via ORAL
  Filled 2018-12-15: qty 1

## 2018-12-15 MED ORDER — LORATADINE 10 MG PO TABS
10.0000 mg | ORAL_TABLET | Freq: Every day | ORAL | Status: DC
  Administered 2018-12-15: 10 mg via ORAL
  Filled 2018-12-15 (×2): qty 1

## 2018-12-15 MED ORDER — ACETAMINOPHEN 325 MG PO TABS: 650.0000 mg | ORAL_TABLET | Freq: Four times a day (QID) | ORAL | Status: DC | PRN

## 2018-12-15 MED ORDER — SODIUM CHLORIDE 0.9 % IV BOLUS
1000.0000 mL | Freq: Once | INTRAVENOUS | Status: AC
  Administered 2018-12-15: 1000 mL via INTRAVENOUS

## 2018-12-15 MED ORDER — POLYETHYLENE GLYCOL 3350 17 G PO PACK
17.0000 g | PACK | Freq: Every day | ORAL | Status: DC
  Filled 2018-12-15: qty 1

## 2018-12-15 MED ORDER — MORPHINE SULFATE (PF) 2 MG/ML IV SOLN
2.0000 mg | INTRAVENOUS | Status: AC | PRN
  Administered 2018-12-15 – 2018-12-16 (×3): 2 mg via INTRAVENOUS
  Filled 2018-12-15 (×3): qty 1

## 2018-12-15 MED ORDER — BISACODYL 10 MG RE SUPP
10.0000 mg | Freq: Every day | RECTAL | Status: DC | PRN
  Filled 2018-12-15: qty 1

## 2018-12-15 MED ORDER — SENNOSIDES-DOCUSATE SODIUM 8.6-50 MG PO TABS
1.0000 | ORAL_TABLET | Freq: Two times a day (BID) | ORAL | Status: DC
  Administered 2018-12-15 – 2018-12-16 (×3): 1 via ORAL
  Filled 2018-12-15 (×3): qty 1

## 2018-12-15 MED ORDER — CYCLOBENZAPRINE HCL 5 MG PO TABS
5.0000 mg | ORAL_TABLET | Freq: Three times a day (TID) | ORAL | Status: DC | PRN
  Administered 2018-12-16: 5 mg via ORAL
  Filled 2018-12-15: qty 1

## 2018-12-15 MED ORDER — MORPHINE SULFATE (PF) 4 MG/ML IV SOLN
4.0000 mg | Freq: Once | INTRAVENOUS | Status: AC
  Administered 2018-12-15: 4 mg via INTRAVENOUS
  Filled 2018-12-15: qty 1

## 2018-12-15 MED ORDER — ACETAMINOPHEN 650 MG RE SUPP: 650.0000 mg | Freq: Four times a day (QID) | RECTAL | Status: DC | PRN

## 2018-12-15 MED ORDER — TRAZODONE HCL 50 MG PO TABS
50.0000 mg | ORAL_TABLET | Freq: Every evening | ORAL | Status: DC | PRN
  Filled 2018-12-15: qty 1

## 2018-12-15 MED ORDER — SODIUM CHLORIDE 0.9% FLUSH: 3.0000 mL | INTRAVENOUS | Status: DC | PRN

## 2018-12-15 MED ORDER — SODIUM CHLORIDE 0.9% IV SOLUTION
Freq: Once | INTRAVENOUS | Status: AC
  Administered 2018-12-15: 14:00:00 via INTRAVENOUS

## 2018-12-15 MED ORDER — CIPROFLOXACIN HCL 500 MG PO TABS
500.0000 mg | ORAL_TABLET | Freq: Two times a day (BID) | ORAL | Status: DC
  Administered 2018-12-15 – 2018-12-16 (×3): 500 mg via ORAL
  Filled 2018-12-15 (×3): qty 1

## 2018-12-15 MED ORDER — POTASSIUM CHLORIDE CRYS ER 20 MEQ PO TBCR
40.0000 meq | EXTENDED_RELEASE_TABLET | Freq: Once | ORAL | Status: AC
  Administered 2018-12-15: 40 meq via ORAL
  Filled 2018-12-15: qty 2

## 2018-12-15 MED ORDER — ALBUTEROL SULFATE (2.5 MG/3ML) 0.083% IN NEBU: 2.5000 mg | INHALATION_SOLUTION | Freq: Four times a day (QID) | RESPIRATORY_TRACT | Status: DC | PRN

## 2018-12-15 MED ORDER — SODIUM CHLORIDE 0.9% FLUSH
3.0000 mL | Freq: Two times a day (BID) | INTRAVENOUS | Status: DC
  Administered 2018-12-15 – 2018-12-16 (×2): 3 mL via INTRAVENOUS

## 2018-12-15 MED ORDER — SODIUM CHLORIDE 0.9 % IV SOLN: 250.0000 mL | INTRAVENOUS | Status: DC | PRN

## 2018-12-15 MED ORDER — PROMETHAZINE HCL 25 MG PO TABS: 12.5000 mg | ORAL_TABLET | Freq: Four times a day (QID) | ORAL | Status: DC | PRN

## 2018-12-15 MED ORDER — SODIUM CHLORIDE 0.9% IV SOLUTION: Freq: Once | INTRAVENOUS | Status: DC

## 2018-12-15 MED ORDER — MORPHINE SULFATE (PF) 2 MG/ML IV SOLN: 2.0000 mg | INTRAVENOUS | Status: DC | PRN

## 2018-12-15 MED ORDER — OXYCODONE HCL 5 MG PO TABS
5.0000 mg | ORAL_TABLET | ORAL | Status: DC | PRN
  Administered 2018-12-15 – 2018-12-16 (×4): 5 mg via ORAL
  Filled 2018-12-15 (×5): qty 1

## 2018-12-15 NOTE — ED Notes (Signed)
[facility] called and received report, eta 25 min.

## 2018-12-15 NOTE — Progress Notes (Signed)
Drew labs to expedite getting blood.

## 2018-12-15 NOTE — Progress Notes (Signed)
Dr. Towanda Malkin called pt on her cell phone. The SWOT RN took his number and I called him and left a message and my call back number.

## 2018-12-15 NOTE — Progress Notes (Signed)
After getting a partial report on this pt I paged Dr Wynetta Emery who told me the pt should have CT and I unit of blood prior to transport. I called back to let ED charge know. I also paged a spoke with Dr Wynetta Emery to let him know that the plant is the pt being transported without the CT or blood.

## 2018-12-15 NOTE — ED Notes (Signed)
Attempted to call report, nurse to call back for report-number given

## 2018-12-15 NOTE — Progress Notes (Signed)
Spoke w/ Dr. Posey Pronto who is now attending MD.  Relayed that pts H/H continues to drop and is now 4.9/16.9.  Relayed that first unit RBC started, pt w/ soft BP, however MAP remains above 65, tachycardic in low 100s.  Relayed that pt has not had an MD assess her since arrival to unit.  Per Dr. Posey Pronto, he has spoken w/ Dr. Towanda Malkin, Surgery who has stated he will be by to see pt today and that no imaging should be ordered.

## 2018-12-15 NOTE — Progress Notes (Signed)
Dr. Wynetta Emery called to let me know Dr. Towanda Malkin did not want pt to have CT. Dr. Wynetta Emery has handed off to Dr. Marlowe Sax here at [facility] and he is aware the CT is being canceled.

## 2018-12-15 NOTE — Progress Notes (Signed)
Called phlebotomy to ask them to do type and screen asap.

## 2018-12-15 NOTE — ED Notes (Signed)
Lab called and stated not enough urine to do urine pregnancy.

## 2018-12-15 NOTE — H&P (Addendum)
History and Physical  Brenda Spencer UXN:[id number] DOB: 05-Dec-1991 DOA: 12/15/2018  Referring physician: Phylliss Bob PCP: Eustaquio Maize, MD   Chief Complaint: postop problem  HPI: Brenda Spencer is a 28 y.o. female who was brought into the ED by [facility] due to complaints of postop pain to surgical incision sites to the abdomen and buttocks.  The patient apparently had plastic surgery done including a tummy tuck and Brazilian butt lift on 12/10/2018 at [facility] with Dr. Towanda Malkin.  The patient is complaining of pain to the lower abdomen and right hip and states she has not had a bowel movement since surgery.  She also reports generalized weakness and dizziness with standing.  She reports that she has had no nausea vomiting or diarrhea.  She has been taking hydrocodone for pain management but reports that she is out of her pain medication at this time.  She has not been ambulating well since surgery.  She has had some difficulty with emptying her Penrose drain and there was some confusion about how to reset the drain after it has been emptied.  The patient reports difficulty with urination but reports no blood in the urine.  She reports she has not had a bowel movement since surgery.  She reports that she has chronic constipation.  ED course: The patient was evaluated and noted to have a critically low hemoglobin of 5.2.  Baseline hemoglobin reportedly is 9.  The patient was noted to have normal vital signs and negative orthostatic testing.  CT abdomen is pending at this time.  The patient's surgeon was consulted who recommended that the patient be admitted to [facility] and he would evaluate the patient in consultation.   Review of Systems: All systems reviewed and apart from history of presenting illness, are negative.  Past Medical History:  Diagnosis Date  . Acute post-traumatic headache, not intractable 01/19/2016  . Asthma    ALbuterol inhaler as  needed  . CHF (congestive heart failure) ([place]) 2011   RESOLVED; HAD CARDIAC EVAL   . Chronic back pain    reason unknown  . Congestive heart failure ([place]) 2011   related to preeclampsia  . Nocturia   . Noncompliance   . Postgastrectomy malabsorption    Past Surgical History:  Procedure Laterality Date  . BREAST ENHANCEMENT SURGERY    . CHOLECYSTECTOMY     teenager  . SLEEVE GASTROPLASTY  01/18/2017  . SUBOCCIPITAL CRANIECTOMY CERVICAL LAMINECTOMY N/A 06/09/2016   Procedure: Chiari Decompression;  Surgeon: Consuella Lose, MD;  Location: [facility];  Service: Neurosurgery;  Laterality: N/A;  posterior/occipital  . TEAR DUCT PROBING  as a child  . TONSILLECTOMY     as a teenager  . TUBAL LIGATION Bilateral 09/10/2015   Procedure: POST PARTUM TUBAL LIGATION;  Surgeon: Mora Bellman, MD;  Location: [facility];  Service: Gynecology;  Laterality: Bilateral;  . WISDOM TOOTH EXTRACTION     as a teenager   Social History:  reports that she has never smoked. She has never used smokeless tobacco. She reports previous drug use. Drug: Marijuana. She reports that she does not drink alcohol.  Allergies  Allergen Reactions  . Metoclopramide Other (See Comments) and Swelling    Tardive dyskinesia  . Ondansetron Nausea And Vomiting    Family History  Problem Relation Age of Onset  . Learning disabilities Other   . Colon cancer Maternal Grandmother   . Cancer Maternal Grandmother  colon  . Asthma Brother   . Learning disabilities Brother   . Asthma Daughter   . Learning disabilities Daughter   . Seizures Daughter   . Chromosomal disorder Daughter        1q21.1 microdeletion  . Cataracts Daughter        congenital  . Anesthesia problems Neg Hx   . Other Neg Hx     Prior to Admission medications   Medication Sig Start Date End Date Taking? Authorizing Provider  albuterol (PROVENTIL) (2.5 MG/3ML) 0.083% nebulizer solution Take 3 mLs (2.5 mg total) by nebulization every 6 (six)  hours as needed for wheezing or shortness of breath. 12/24/16  Yes Eustaquio Maize, MD  cetirizine (ZYRTEC) 10 MG tablet Take 1 tablet (10 mg total) by mouth daily. 07/04/18  Yes Eustaquio Maize, MD  ciprofloxacin (CIPRO) 500 MG tablet Take 1 tablet (500 mg total) by mouth 2 (two) times daily. 09/15/18  Yes Hawks, Christy A, FNP  cyclobenzaprine (FLEXERIL) 10 MG tablet Take 1 tablet (10 mg total) by mouth 3 (three) times daily as needed for muscle spasms. 08/31/18  Yes Sherwood Gambler, MD  traZODone (DESYREL) 50 MG tablet Take 1 tablet (50 mg total) by mouth at bedtime as needed for sleep. 06/09/18  Yes Mordecai Maes, NP  ferrous sulfate 324 (65 Fe) MG TBEC Take 1 tablet (325 mg total) by mouth daily. Patient not taking: Reported on 12/15/2018 07/04/18   Eustaquio Maize, MD  miconazole (MICOTIN) 2 % cream Apply 1 application topically 2 (two) times daily. Patient not taking: Reported on 12/15/2018 09/10/18   Dettinger, Fransisca Kaufmann, MD   Physical Exam: Vitals:   12/15/18 0805 12/15/18 0806  BP: 107/79   Pulse: 98   Resp: 12   Temp: 98.1 F (36.7 C)   TempSrc: Oral   SpO2: 98%   Weight:  53.1 kg  Height:  5' (1.524 m)     General exam: Moderately built and nourished patient, lying supine on the gurney in mild distress.  Pt appears pale.    Head, eyes and ENT: Nontraumatic and normocephalic. Pupils equally reacting to light and accommodation. Oral mucosa dry.  Neck: Supple. No JVD, carotid bruit or thyromegaly.  Lymphatics: No lymphadenopathy.  Respiratory system: shallow breath sounds bilateral. Clear to auscultation. No increased work of breathing.  Cardiovascular system: S1 and S2 heard.  No JVD, murmurs, gallops, clicks or pedal edema.  Gastrointestinal system: Abdomen is nondistended, soft and nontender. Normal bowel sounds heard. No organomegaly or masses appreciated.  Central nervous system: Alert and oriented. No focal neurological deficits.  Extremities: Symmetric 5 x 5 power.  Peripheral pulses symmetrically felt.   Skin: No rashes or acute findings.  Musculoskeletal system: Negative exam.  Psychiatry: Pleasant and cooperative.  Labs on Admission:  Basic Metabolic Panel: Recent Labs  Lab 12/15/18 0855  NA 136  K 3.2*  CL 104  CO2 26  GLUCOSE 93  BUN 11  CREATININE 0.46  CALCIUM 7.8*   Liver Function Tests: Recent Labs  Lab 12/15/18 0855  AST 16  ALT 19  ALKPHOS 61  BILITOT 1.0  PROT 6.1*  ALBUMIN 3.1*   Recent Labs  Lab 12/15/18 0855  LIPASE 29   No results for input(s): AMMONIA in the last 168 hours. CBC: Recent Labs  Lab 12/15/18 0855  WBC 5.2  NEUTROABS 3.4  HGB 5.2*  HCT 18.2*  MCV 79.5*  PLT 320   Cardiac Enzymes: No results for input(s): CKTOTAL, CKMB,  CKMBINDEX, TROPONINI in the last 168 hours.  BNP (last 3 results) No results for input(s): PROBNP in the last 8760 hours. CBG: No results for input(s): GLUCAP in the last 168 hours.  Radiological Exams on Admission: No results found.  EKG: Independently reviewed.   Assessment/Plan Principal Problem:   Symptomatic anemia Active Problems:   Acute blood loss anemia   UTI (urinary tract infection)   Chronic back pain   Chronic constipation   Hypokalemia  1. Symptomatic acute blood loss anemia - from recent surgery - Hg down to 5 from reported 9.  Transfuse 3 units PRBC.  Follow Hg.  Check CT abdomen / pelvis to check for possible hematoma formation.   Consult to surgeon who will see patient at [facility].   Update: I spoke with Dr. Towanda Malkin and he said he did not think that CT was needed and could cancel.   2. Chronic constipation - exacerbated by recent opioid use - schedule laxatives.  Pt had no fecal impaction when checked in ED.  3. Postop pain - pain medications ordered.   4. Hypokalemia - replace, check magnesium.   5. UTI - Pt had been started on cipro for this and will continue for now.    DVT Prophylaxis: SCDs (holding heparin until we are sure she  doesn't have a hematoma) Code Status: Full   Family Communication: patient updated at bedside   Disposition Plan: transfer to [facility]   Time spent: 58 minutes  Irwin Brakeman, MD [facility]  If 7PM-7AM, please contact night-coverage [email or web address] Password [email or web address] 12/15/2018, 11:59 AM

## 2018-12-15 NOTE — Progress Notes (Signed)
Per pt, she has not had BM since day of surgery on 12/10/18.  MD has ordered Miralax and PRN dulcolax. Pt refusing both.  This RN educated pt on potential consequences of refusing stool softeners/laxatives including bowel obstruction/perf, esp since pt is taking frequent pain meds.  Pt states understanding, still refusing. Attending MD aware.

## 2018-12-15 NOTE — Progress Notes (Signed)
Paged and spoke to [employer]. Reported pt has increased drainage and pain. She states Dr Towanda Malkin needs to be called. Oncoming RN will call him.

## 2018-12-15 NOTE — Progress Notes (Signed)
Called and left vm for Dr. Towanda Malkin.

## 2018-12-15 NOTE — ED Notes (Signed)
CRITICAL VALUE ALERT  Critical Value:  Hemoglobin 5.2  Date & Time Notied:  12/15/2018  Provider Notified: Phylliss Bob, [place]  Orders Received/Actions taken: None yet

## 2018-12-15 NOTE — ED Notes (Signed)
Bladder scan: 229 mL

## 2018-12-15 NOTE — ED Triage Notes (Signed)
PT brought in by [facility] today for post op pain to surgical incision sites to abdomen and BBL. PT states she had plastic surgery (tummy tuck, Brazilian butt lift) 12/10/18 at [facility] with Dr. Lucretia Field [phone]). PT c/o pain to lower abdomen and right hip and states no BM since surgery. PT also c/o dizziness with standing.

## 2018-12-15 NOTE — Progress Notes (Signed)
12/15/2018 12:54 PM  I spoke with transport team.  CT needs to be done prior to transfer and 1 unit PRBC to be given.    Murvin Natal MD

## 2018-12-15 NOTE — Progress Notes (Signed)
Unit secretary has requested SCD's, portable said they are on backorder.

## 2018-12-15 NOTE — ED Provider Notes (Signed)
Patient complains of pain at surgical since she had plastic surgery at Dr. Neita Goodnight office on 12/10/2018.  She complains of lightheadedness with standing for the past 2 days.  She denies fever denies vomiting denies urinary symptoms.  No other associated symptoms.  On exam she is alert and nontoxic trickle wounds appear clean.  She has no tenderness on abdominal exam.   Orlie Dakin, MD 12/15/18 [phone]

## 2018-12-15 NOTE — ED Provider Notes (Signed)
[facility] EMERGENCY DEPARTMENT Provider Note   CSN: [id number] Arrival date & time: 12/15/18  0800     History   Chief Complaint Chief Complaint  Patient presents with  . Post-op Problem    HPI Brenda Spencer is a 28 y.o. female with a past medical history of chronic back pain, asthma, depression,  S/p tubal ligation, sleeve gastropexy, cholecystectomy.  She reports that since Tuesday she has been feeling dizzy and light headed when she stands up.  She reports that she had a tummy tuck and a brazilian butt lift.    She reports she is hurting "really bad" on her right side.  No N/V/D.  She has been taking hydrocodone with her last dose last night.  She has not been taking any stool softeners.  She reports that she hasn't been eating much, only regular food since Wednesday.    She states that she has not run out of her pain medicine, however did not take any this morning.  She has not been up, walking, or moving around since her surgery.  Her mother has been helping her and emptying her Penrose drain.  When they are emptying the drain before closing it they are not re-squeezing it to apply negative pressure.  RN reports that when he did this today there was a large amount of drainage.  Patient currently reports that she is having difficulty urinating.  HPI  Past Medical History:  Diagnosis Date  . Acute post-traumatic headache, not intractable 01/19/2016  . Asthma    ALbuterol inhaler as needed  . CHF (congestive heart failure) ([place]) 2011   RESOLVED; HAD CARDIAC EVAL   . Chronic back pain    reason unknown  . Congestive heart failure ([place]) 2011   related to preeclampsia  . Nocturia   . Noncompliance   . Postgastrectomy malabsorption     Patient Active Problem List   Diagnosis Date Noted  . Suicidal ideations   . Severe recurrent major depression without psychotic features ([place]) 06/07/2018  . Low back pain 09/30/2016  . Obesity, Class II, BMI 35-39.9 07/20/2016  .  Chiari I malformation ([place]) 06/09/2016  . Arnold-Chiari malformation, type I ([place]) 01/20/2016  . Exposure to second hand tobacco smoke 01/19/2016  . Chromosome 1 deletion syndrome   . Parent of child with chromosome abnormality   . History of pregnancy induced hypertension 02/28/2015  . History of congestive heart failure 01/09/2013  . Mild persistent asthma 11/18/2012    Past Surgical History:  Procedure Laterality Date  . BREAST ENHANCEMENT SURGERY    . CHOLECYSTECTOMY     teenager  . SLEEVE GASTROPLASTY  01/18/2017  . SUBOCCIPITAL CRANIECTOMY CERVICAL LAMINECTOMY N/A 06/09/2016   Procedure: Chiari Decompression;  Surgeon: Consuella Lose, MD;  Location: [facility];  Service: Neurosurgery;  Laterality: N/A;  posterior/occipital  . TEAR DUCT PROBING  as a child  . TONSILLECTOMY     as a teenager  . TUBAL LIGATION Bilateral 09/10/2015   Procedure: POST PARTUM TUBAL LIGATION;  Surgeon: Mora Bellman, MD;  Location: [place] [facility];  Service: Gynecology;  Laterality: Bilateral;  . WISDOM TOOTH EXTRACTION     as a teenager     OB History    Gravida  5   Para  4   Term  3   Preterm  1   AB  1   Living  4     SAB  1   TAB      Ectopic  Multiple  0   Live Births  4        Obstetric Comments  2011 PIH;IOL; PP CHF-HOSPITALIZED X 1 WEEK         Home Medications    Prior to Admission medications   Medication Sig Start Date End Date Taking? Authorizing Provider  albuterol (PROVENTIL) (2.5 MG/3ML) 0.083% nebulizer solution Take 3 mLs (2.5 mg total) by nebulization every 6 (six) hours as needed for wheezing or shortness of breath. 12/24/16  Yes Eustaquio Maize, MD  cetirizine (ZYRTEC) 10 MG tablet Take 1 tablet (10 mg total) by mouth daily. 07/04/18  Yes Eustaquio Maize, MD  ciprofloxacin (CIPRO) 500 MG tablet Take 1 tablet (500 mg total) by mouth 2 (two) times daily. 09/15/18  Yes Hawks, Christy A, FNP  cyclobenzaprine (FLEXERIL) 10 MG tablet Take 1 tablet (10 mg  total) by mouth 3 (three) times daily as needed for muscle spasms. 08/31/18  Yes Sherwood Gambler, MD  traZODone (DESYREL) 50 MG tablet Take 1 tablet (50 mg total) by mouth at bedtime as needed for sleep. 06/09/18  Yes Mordecai Maes, NP  ferrous sulfate 324 (65 Fe) MG TBEC Take 1 tablet (325 mg total) by mouth daily. Patient not taking: Reported on 12/15/2018 07/04/18   Eustaquio Maize, MD  hydrOXYzine (ATARAX/VISTARIL) 25 MG tablet Take 1 tablet (25 mg total) by mouth every 6 (six) hours as needed for anxiety. Patient not taking: Reported on 12/15/2018 06/09/18   Mordecai Maes, NP  miconazole (MICOTIN) 2 % cream Apply 1 application topically 2 (two) times daily. Patient not taking: Reported on 12/15/2018 09/10/18   Dettinger, Fransisca Kaufmann, MD  naproxen (NAPROSYN) 375 MG tablet Take 1 tablet (375 mg total) by mouth 2 (two) times daily with a meal. Patient not taking: Reported on 12/15/2018 08/31/18   Sherwood Gambler, MD  propranolol (INDERAL) 10 MG tablet Take 1 tablet (10 mg total) by mouth 3 (three) times daily as needed (anxiety). Patient not taking: Reported on 12/15/2018 06/09/18   Mordecai Maes, NP    Family History Family History  Problem Relation Age of Onset  . Learning disabilities Other   . Colon cancer Maternal Grandmother   . Cancer Maternal Grandmother        colon  . Asthma Brother   . Learning disabilities Brother   . Asthma Daughter   . Learning disabilities Daughter   . Seizures Daughter   . Chromosomal disorder Daughter        1q21.1 microdeletion  . Cataracts Daughter        congenital  . Anesthesia problems Neg Hx   . Other Neg Hx     Social History Social History   Tobacco Use  . Smoking status: Never Smoker  . Smokeless tobacco: Never Used  Substance Use Topics  . Alcohol use: No    Alcohol/week: 0.0 standard drinks  . Drug use: Not Currently    Types: Marijuana     Allergies   Metoclopramide and Ondansetron   Review of Systems Review of Systems    Constitutional: Negative for chills, diaphoresis and fever.  Respiratory: Negative for shortness of breath.   Cardiovascular: Negative for chest pain.  Gastrointestinal: Positive for abdominal pain and constipation. Negative for diarrhea, nausea and vomiting.  Genitourinary: Positive for difficulty urinating. Negative for dysuria, frequency and pelvic pain.  Musculoskeletal: Positive for back pain. Negative for neck pain.       Buttock pain  Neurological: Positive for light-headedness (When standing.).  All  other systems reviewed and are negative.    Physical Exam Updated Vital Signs BP 107/79 (BP Location: Right Arm)   Pulse 98   Temp 98.1 F (36.7 C) (Oral)   Resp 12   Ht 5' (1.524 m)   Wt 53.1 kg   LMP 12/02/2018   SpO2 98%   BMI 22.85 kg/m   Physical Exam Vitals signs and nursing note reviewed.  Constitutional:      General: She is not in acute distress.    Appearance: She is well-developed. She is not diaphoretic.  HENT:     Head: Normocephalic and atraumatic.     Nose: Nose normal.  Eyes:     General: No scleral icterus.       Right eye: No discharge.        Left eye: No discharge.     Conjunctiva/sclera: Conjunctivae normal.  Neck:     Musculoskeletal: Normal range of motion and neck supple.  Cardiovascular:     Rate and Rhythm: Normal rate and regular rhythm.     Heart sounds: Normal heart sounds.  Pulmonary:     Effort: Pulmonary effort is normal. No respiratory distress.     Breath sounds: Normal breath sounds. No stridor.  Abdominal:     General: Abdomen is flat. Bowel sounds are normal. There is no distension.     Palpations: Abdomen is soft.     Tenderness: There is abdominal tenderness (very mild diffuse).  Genitourinary:    Comments: Exam performed with patient's primary RN as chaperone.  No stool palpable in the rectal vault.  Musculoskeletal:        General: No deformity.  Skin:    General: Skin is warm and dry.     Comments: Please see  clinical images.  Dressing was removed, dressing appeared to mostly be white paper drapes that were saturated with dried blood and taped to the skin.  There is a slight opening in the incision in the RLQ where the subcutaneous sutures are visible. No purulent drainage.   Neurological:     General: No focal deficit present.     Mental Status: She is alert.     Motor: No abnormal muscle tone.  Psychiatric:        Mood and Affect: Mood normal.        Behavior: Behavior normal.          ED Treatments / Results  Labs (all labs ordered are listed, but only abnormal results are displayed) Labs Reviewed  COMPREHENSIVE METABOLIC PANEL - Abnormal; Notable for the following components:      Result Value   Potassium 3.2 (*)    Calcium 7.8 (*)    Total Protein 6.1 (*)    Albumin 3.1 (*)    All other components within normal limits  CBC WITH DIFFERENTIAL/PLATELET - Abnormal; Notable for the following components:   RBC 2.29 (*)    Hemoglobin 5.2 (*)    HCT 18.2 (*)    MCV 79.5 (*)    MCH 22.7 (*)    MCHC 28.6 (*)    RDW 16.9 (*)    All other components within normal limits  URINALYSIS, ROUTINE W REFLEX MICROSCOPIC - Abnormal; Notable for the following components:   APPearance HAZY (*)    Leukocytes, UA LARGE (*)    Bacteria, UA RARE (*)    All other components within normal limits  LIPASE, BLOOD  VITAMIN B12  FOLATE  IRON AND TIBC  FERRITIN  RETICULOCYTES  TYPE AND SCREEN  PREPARE RBC (CROSSMATCH)    EKG None  Radiology No results found.  Procedures .Critical Care Performed by: Lorin Glass, PA-C Authorized by: Lorin Glass, PA-C   Critical care provider statement:    Critical care time (minutes):  45   Critical care was time spent personally by me on the following activities:  Discussions with consultants, evaluation of patient's response to treatment, examination of patient, ordering and performing treatments and interventions, ordering and review of  laboratory studies, ordering and review of radiographic studies, pulse oximetry, re-evaluation of patient's condition, obtaining history from patient or surrogate and review of old charts Comments:     Symptomatic anemia requiring admission, ordered transfusion of blood.     (including critical care time)   Medications Ordered in ED Medications  0.9 %  sodium chloride infusion (Manually program via Guardrails IV Fluids) (has no administration in time range)  morphine 4 MG/ML injection 4 mg (4 mg Intravenous Given 12/15/18 0834)  sodium chloride 0.9 % bolus 500 mL (0 mLs Intravenous Stopped 12/15/18 0909)  sodium chloride 0.9 % bolus 1,000 mL (1,000 mLs Intravenous Transfusing/Transfer 12/15/18 1251)  potassium chloride SA (K-DUR,KLOR-CON) CR tablet 40 mEq (40 mEq Oral Given 12/15/18 1206)   Orthostatic VS for the past 24 hrs:  BP- Lying Pulse- Lying BP- Sitting Pulse- Sitting BP- Standing at 0 minutes Pulse- Standing at 0 minutes  12/15/18 0912 118/74 99 113/70 99 109/78 100      Initial Impression / Assessment and Plan / ED Course  I have reviewed the triage vital signs and the nursing notes.  Pertinent labs & imaging results that were available during my care of the patient were reviewed by me and considered in my medical decision making (see chart for details).  Clinical Course as of Dec 15 1708  Thu Dec 15, 2018  0932 2 units of blood ordered.  Discussed risks of blood transfusion with patient.  Hemoglobin(!!): 5.2 [EH]  0936 Called Dr. Towanda Malkin who requests that if patient is to be admitted that she is transferred to [facility] so that way he can see and evaluate her.  His cell number is [phone] and he requests that whom ever admits her to hospital give him a call.    [EH]  1057 Spoke with Dr. Wynetta Emery, hospitalist who requested that CT abdomen pelvis be ordered to evaluate for post operative bleeding/hematoma given approx 3 gram drop with Dr. Towanda Malkin reporting minimal bleeding  during surgery.    [EH]    Clinical Course User Index [EH] Lorin Glass, PA-C   Patient presents today for evaluation of worsening abdominal pain.  She had a tummy tuck and a [place] butt lift on 12/10/2018 at [facility] with Dr. Towanda Malkin.  She reports that her abdominal pain has gotten worse over the past day, and in addition over the past 2 days she has had dizziness when she stands.      Orthostatics were obtained, without significant change.  CBC shows her hemoglobin is significantly reduced at 5.2.  Previous labs show that she typically runs around 9, however those were obtained in May.  Transfusion of 2 units ordered.  This was then changed to 1 unit in anticipation of her being transferred to [facility].  Her potassium is slightly low at 3.2.  Her pain was treated in the department with morphine, she was given 500 mL fluid bolus followed by 1000 mL fluid bolus.  Physical exam after dressings were removed showed no obvious superficial infection.  I spoke with Dr. Towanda Malkin who performed the surgery, he request that if patient is to be admitted she be taken to [facility] so that he can evaluate her.  I spoke with Dr. Wynetta Emery, hospitalist, who requested CT abdomen pelvis be ordered given reported lack of bleeding during surgery with a 3 g drop of hemoglobin.  This was ordered.    This patient was seen as a shared visit with Dr. Cathleen Fears.   Patient will be admitted to the hospitalist.   Final Clinical Impressions(s) / ED Diagnoses   Final diagnoses:  Symptomatic anemia  Blood loss anemia    ED Discharge Orders    None       Ollen Gross 12/15/18 1716    Orlie Dakin, MD 12/16/18 1758

## 2018-12-16 DIAGNOSIS — D649 Anemia, unspecified: Secondary | ICD-10-CM | POA: Diagnosis not present

## 2018-12-16 DIAGNOSIS — K5909 Other constipation: Secondary | ICD-10-CM | POA: Diagnosis not present

## 2018-12-16 DIAGNOSIS — E876 Hypokalemia: Secondary | ICD-10-CM | POA: Diagnosis not present

## 2018-12-16 LAB — COMPREHENSIVE METABOLIC PANEL
ALT: 16 U/L (ref 0–44)
AST: 21 U/L (ref 15–41)
Albumin: 2.3 g/dL — ABNORMAL LOW (ref 3.5–5.0)
Alkaline Phosphatase: 51 U/L (ref 38–126)
Anion gap: 7 (ref 5–15)
BILIRUBIN TOTAL: 1.3 mg/dL — AB (ref 0.3–1.2)
BUN: 7 mg/dL (ref 6–20)
CO2: 23 mmol/L (ref 22–32)
CREATININE: 0.56 mg/dL (ref 0.44–1.00)
Calcium: 7.6 mg/dL — ABNORMAL LOW (ref 8.9–10.3)
Chloride: 108 mmol/L (ref 98–111)
GFR calc Af Amer: >60 mL/min (ref >60)
GFR calc non Af Amer: >60 mL/min (ref >60)
Glucose, Bld: 96 mg/dL (ref 70–99)
Potassium: 3.8 mmol/L (ref 3.5–5.1)
Sodium: 138 mmol/L (ref 135–145)
Total Protein: 4.7 g/dL — ABNORMAL LOW (ref 6.5–8.1)

## 2018-12-16 LAB — CBC
HCT: 27.8 % — ABNORMAL LOW (ref 36.0–46.0)
Hemoglobin: 9 g/dL — ABNORMAL LOW (ref 12.0–15.0)
MCH: 26.2 pg (ref 26.0–34.0)
MCHC: 32.4 g/dL (ref 30.0–36.0)
MCV: 80.8 fL (ref 80.0–100.0)
Platelets: 271 10*3/uL (ref 150–400)
RBC: 3.44 MIL/uL — ABNORMAL LOW (ref 3.87–5.11)
RDW: 15.8 % — ABNORMAL HIGH (ref 11.5–15.5)
WBC: 5.7 10*3/uL (ref 4.0–10.5)
nRBC: 0 % (ref 0.0–0.2)

## 2018-12-16 LAB — HIV ANTIBODY (ROUTINE TESTING W REFLEX): HIV Screen 4th Generation wRfx: NONREACTIVE

## 2018-12-16 LAB — MAGNESIUM: Magnesium: 1.7 mg/dL (ref 1.7–2.4)

## 2018-12-16 MED ORDER — DOCUSATE SODIUM 100 MG PO CAPS: 100.0000 mg | ORAL_CAPSULE | Freq: Two times a day (BID) | ORAL | 0 refills | Status: DC

## 2018-12-16 MED ORDER — LIDOCAINE HCL (PF) 1 % IJ SOLN
INTRAMUSCULAR | Status: AC
  Administered 2018-12-16: 15:00:00
  Filled 2018-12-16: qty 30

## 2018-12-16 MED ORDER — DOCUSATE SODIUM 100 MG PO CAPS
100.0000 mg | ORAL_CAPSULE | Freq: Two times a day (BID) | ORAL | Status: DC
  Filled 2018-12-16: qty 1

## 2018-12-16 NOTE — Progress Notes (Signed)
Dr. Towanda Malkin called and  can't see pt. at 0600 and will come around 1230 and he will call pt. and let her know; supplies he needs will be at nursing station to change out JP drain.

## 2018-12-16 NOTE — Progress Notes (Signed)
Paged Schorr,NP- no RTC from Dr. Towanda Malkin. shortly afterwards Dr. Towanda Malkin called and talked with pt. and RN; stated he will come around 0600 and change out JP drain.

## 2018-12-16 NOTE — Progress Notes (Signed)
Discussed with the patient and all questioned fully answered. She will call me if any problems arise.  IV removed. Telemetry removed. Abdominal binder in place - extra abd binder given to patient.   Fritz Pickerel, RN

## 2018-12-16 NOTE — Discharge Summary (Signed)
Physician Discharge Summary  Brenda Spencer VCB:[id number] DOB: 11/24/1991 DOA: 12/15/2018  PCP: Eustaquio Maize, MD  Admit date: 12/15/2018 Discharge date: 12/16/2018  Admitted From: home Discharge disposition: home   Recommendations for Outpatient Follow-Up:   1. Cbc 1 week 2. Follow up with Dr. Towanda Malkin   Discharge Diagnosis:   Principal Problem:   Symptomatic anemia Active Problems:   UTI (urinary tract infection)   Acute blood loss anemia   Chronic back pain   Chronic constipation   Hypokalemia    Discharge Condition: Improved.  Diet recommendation:   Regular.  Wound care: None.  Code status: Full.   History of Present Illness:   Brenda Spencer is a 28 y.o. female who was brought into the ED by [facility] due to complaints of postop pain to surgical incision sites to the abdomen and buttocks.  The patient apparently had plastic surgery done including a tummy tuck and Brazilian butt lift on 12/10/2018 at [facility] with Dr. Towanda Malkin.  The patient is complaining of pain to the lower abdomen and right hip and states she has not had a bowel movement since surgery.  She also reports generalized weakness and dizziness with standing.  She reports that she has had no nausea vomiting or diarrhea.  She has been taking hydrocodone for pain management but reports that she is out of her pain medication at this time.  She has not been ambulating well since surgery.  She has had some difficulty with emptying her Penrose drain and there was some confusion about how to reset the drain after it has been emptied.  The patient reports difficulty with urination but reports no blood in the urine.  She reports she has not had a bowel movement since surgery.  She reports that she has chronic constipation.   Hospital Course by Problem:   Symptomatic anemia -s/p 3 units PRBC -H/H back to baseline around 9 -CBC in 1 week -has been seen by Dr.  Towanda Malkin and drain fixed  Chronic constipation -bowel regimen  Hypokalemia -replete  S/p gastric bypass -resume vitamins   Medical Consultants:    Plastic surgery  Discharge Exam:   Vitals:   12/16/18 0943 12/16/18 1320  BP: 106/72 93/78  Pulse: 69 87  Resp:  16  Temp:  98.1 F (36.7 C)  SpO2: 100% 100%   Vitals:   12/16/18 0112 12/16/18 0334 12/16/18 0943 12/16/18 1320  BP: 95/64 108/74 106/72 93/78  Pulse: 82 78 69 87  Resp: 18 18  16   Temp: (!) 97.4 F (36.3 C) 98.1 F (36.7 C)  98.1 F (36.7 C)  TempSrc: Oral Oral  Oral  SpO2: 100% 100% 100% 100%  Weight:      Height:        General exam: Appears calm and comfortable.  The results of significant diagnostics from this hospitalization (including imaging, microbiology, ancillary and laboratory) are listed below for reference.     Procedures and Diagnostic Studies:   No results found.   Labs:   Basic Metabolic Panel: Recent Labs  Lab 12/15/18 0855 12/15/18 1130 12/15/18 1548 12/16/18 0447  NA 136  --   --  138  K 3.2*  --   --  3.8  CL 104  --   --  108  CO2 26  --   --  23  GLUCOSE 93  --   --  96  BUN 11  --   --  7  CREATININE 0.46  --  0.45 0.56  CALCIUM 7.8*  --   --  7.6*  MG  --  2.0  --  1.7   GFR Estimated Creatinine Clearance: 75.2 mL/min (by C-G formula based on SCr of 0.56 mg/dL). Liver Function Tests: Recent Labs  Lab 12/15/18 0855 12/16/18 0447  AST 16 21  ALT 19 16  ALKPHOS 61 51  BILITOT 1.0 1.3*  PROT 6.1* 4.7*  ALBUMIN 3.1* 2.3*   Recent Labs  Lab 12/15/18 0855  LIPASE 29   No results for input(s): AMMONIA in the last 168 hours. Coagulation profile Recent Labs  Lab 12/15/18 1548  INR 0.98    CBC: Recent Labs  Lab 12/15/18 0855 12/15/18 1548 12/16/18 0658  WBC 5.2 5.6 5.7  NEUTROABS 3.4  --   --   HGB 5.2* 4.9* 9.0*  HCT 18.2* 16.9* 27.8*  MCV 79.5* 78.6* 80.8  PLT 320 276 271   Cardiac Enzymes: No results for input(s): CKTOTAL, CKMB,  CKMBINDEX, TROPONINI in the last 168 hours. BNP: Invalid input(s): POCBNP CBG: No results for input(s): GLUCAP in the last 168 hours. D-Dimer No results for input(s): DDIMER in the last 72 hours. Hgb A1c No results for input(s): HGBA1C in the last 72 hours. Lipid Profile No results for input(s): CHOL, HDL, LDLCALC, TRIG, CHOLHDL, LDLDIRECT in the last 72 hours. Thyroid function studies Recent Labs    12/15/18 1500  TSH 0.934   Anemia work up Recent Labs    12/15/18 1130  VITAMINB12 201  FOLATE 9.0  FERRITIN 18  TIBC 326  IRON 22*  RETICCTPCT 3.3*   Microbiology No results found for this or any previous visit (from the past 240 hour(s)).   Discharge Instructions:   Discharge Instructions    Diet general   Complete by:  As directed    Discharge instructions   Complete by:  As directed    Cbc 1 week with PCP Resume directions from plastic surgeon   Increase activity slowly   Complete by:  As directed      Allergies as of 12/16/2018      Reactions   Metoclopramide Other (See Comments), Swelling   Tardive dyskinesia   Ondansetron Nausea And Vomiting      Medication List    STOP taking these medications   miconazole 2 % cream Commonly known as:  MICOTIN     TAKE these medications   albuterol (2.5 MG/3ML) 0.083% nebulizer solution Commonly known as:  PROVENTIL Take 3 mLs (2.5 mg total) by nebulization every 6 (six) hours as needed for wheezing or shortness of breath.   cetirizine 10 MG tablet Commonly known as:  ZYRTEC Take 1 tablet (10 mg total) by mouth daily.   ciprofloxacin 500 MG tablet Commonly known as:  CIPRO Take 1 tablet (500 mg total) by mouth 2 (two) times daily.   cyclobenzaprine 10 MG tablet Commonly known as:  FLEXERIL Take 1 tablet (10 mg total) by mouth 3 (three) times daily as needed for muscle spasms.   docusate sodium 100 MG capsule Commonly known as:  COLACE Take 1 capsule (100 mg total) by mouth 2 (two) times daily.     ferrous sulfate 324 (65 Fe) MG Tbec Take 1 tablet (325 mg total) by mouth daily.   traZODone 50 MG tablet Commonly known as:  DESYREL Take 1 tablet (50 mg total) by mouth at bedtime as needed for sleep.         Time coordinating  discharge: 25 min  Signed:  Geradine Girt DO  [facility] 12/16/2018, 3:44 PM

## 2018-12-16 NOTE — Plan of Care (Signed)
  Problem: Education: Goal: Knowledge of General Education information will improve Description Including pain rating scale, medication(s)/side effects and non-pharmacologic comfort measures Outcome: Progressing Note:  POC reviewed with pt. and informed about another unit PRBC's to be given.

## 2018-12-16 NOTE — Progress Notes (Signed)
Lower abdominal dressing saturated with bloody drainage and J-pratt not staying charged; changed abdominal dressing and new abdominal binder placed. Dr. Towanda Malkin paged at 2348; pt. concerned about bleeding; abdominal binder with a lot old dried blood.

## 2018-12-17 LAB — BPAM RBC
Blood Product Expiration Date: 202001282359
Blood Product Expiration Date: 202001302359
Blood Product Expiration Date: 202001302359
ISSUE DATE / TIME: 202001022030
ISSUE DATE / TIME: 202001021611
ISSUE DATE / TIME: 202001030045
Unit Type and Rh: 6200
Unit Type and Rh: 6200
Unit Type and Rh: 6200

## 2018-12-17 LAB — TYPE AND SCREEN
ABO/RH(D): A POS
Antibody Screen: NEGATIVE
Unit division: 0
Unit division: 0
Unit division: 0

## 2018-12-19 LAB — BPAM RBC
Blood Product Expiration Date: 202001232359
Unit Type and Rh: 6200

## 2018-12-19 LAB — TYPE AND SCREEN
ABO/RH(D): A POS
Antibody Screen: NEGATIVE
Unit division: 0

## 2018-12-26 ENCOUNTER — Emergency Department (HOSPITAL_COMMUNITY): Payer: Medicaid Other

## 2018-12-26 ENCOUNTER — Emergency Department (HOSPITAL_COMMUNITY)
Admission: EM | Admit: 2018-12-26 | Discharge: 2018-12-26 | Disposition: A | Discharge status: Home / Self Care | Payer: Medicaid Other | Attending: Emergency Medicine | Admitting: Emergency Medicine

## 2018-12-26 ENCOUNTER — Encounter (HOSPITAL_COMMUNITY): Payer: Self-pay | Admitting: Emergency Medicine

## 2018-12-26 DIAGNOSIS — I509 Heart failure, unspecified: Secondary | ICD-10-CM | POA: Diagnosis not present

## 2018-12-26 DIAGNOSIS — J45909 Unspecified asthma, uncomplicated: Secondary | ICD-10-CM | POA: Insufficient documentation

## 2018-12-26 DIAGNOSIS — R109 Unspecified abdominal pain: Secondary | ICD-10-CM | POA: Diagnosis not present

## 2018-12-26 DIAGNOSIS — G8918 Other acute postprocedural pain: Secondary | ICD-10-CM | POA: Diagnosis not present

## 2018-12-26 LAB — CBC WITH DIFFERENTIAL/PLATELET
Abs Immature Granulocytes: 0.14 10*3/uL — ABNORMAL HIGH (ref 0.00–0.07)
Basophils Absolute: 0.1 10*3/uL (ref 0.0–0.1)
Basophils Relative: 1 %
EOS PCT: 1 %
Eosinophils Absolute: 0.1 10*3/uL (ref 0.0–0.5)
HCT: 34.8 % — ABNORMAL LOW (ref 36.0–46.0)
Hemoglobin: 10.4 g/dL — ABNORMAL LOW (ref 12.0–15.0)
Immature Granulocytes: 2 %
Lymphocytes Relative: 16 %
Lymphs Abs: 1.4 10*3/uL (ref 0.7–4.0)
MCH: 25.6 pg — ABNORMAL LOW (ref 26.0–34.0)
MCHC: 29.9 g/dL — ABNORMAL LOW (ref 30.0–36.0)
MCV: 85.7 fL (ref 80.0–100.0)
Monocytes Absolute: 0.9 10*3/uL (ref 0.1–1.0)
Monocytes Relative: 10 %
Neutro Abs: 6.1 10*3/uL (ref 1.7–7.7)
Neutrophils Relative %: 70 %
Platelets: 657 10*3/uL — ABNORMAL HIGH (ref 150–400)
RBC: 4.06 MIL/uL (ref 3.87–5.11)
RDW: 17.3 % — AB (ref 11.5–15.5)
WBC: 8.6 10*3/uL (ref 4.0–10.5)
nRBC: 0 % (ref 0.0–0.2)

## 2018-12-26 LAB — COMPREHENSIVE METABOLIC PANEL
ALT: 19 U/L (ref 0–44)
AST: 18 U/L (ref 15–41)
Albumin: 2.8 g/dL — ABNORMAL LOW (ref 3.5–5.0)
Alkaline Phosphatase: 106 U/L (ref 38–126)
Anion gap: 10 (ref 5–15)
BUN: 5 mg/dL — ABNORMAL LOW (ref 6–20)
CO2: 25 mmol/L (ref 22–32)
Calcium: 8.2 mg/dL — ABNORMAL LOW (ref 8.9–10.3)
Chloride: 101 mmol/L (ref 98–111)
Creatinine, Ser: 0.46 mg/dL (ref 0.44–1.00)
GFR calc non Af Amer: >60 mL/min (ref >60)
Glucose, Bld: 94 mg/dL (ref 70–99)
Potassium: 3.4 mmol/L — ABNORMAL LOW (ref 3.5–5.1)
Sodium: 136 mmol/L (ref 135–145)
TOTAL PROTEIN: 6.4 g/dL — AB (ref 6.5–8.1)
Total Bilirubin: 0.8 mg/dL (ref 0.3–1.2)

## 2018-12-26 MED ORDER — MORPHINE SULFATE (PF) 4 MG/ML IV SOLN
4.0000 mg | Freq: Once | INTRAVENOUS | Status: AC
  Administered 2018-12-26: 4 mg via INTRAVENOUS
  Filled 2018-12-26: qty 1

## 2018-12-26 MED ORDER — IOHEXOL 300 MG/ML  SOLN
100.0000 mL | Freq: Once | INTRAMUSCULAR | Status: AC | PRN
  Administered 2018-12-26: 100 mL via INTRAVENOUS

## 2018-12-26 NOTE — ED Notes (Signed)
Patient stated she was seen by her doctor today for post op pain and infection. Patient's PCP would not prescribe pain medication for pt so she came to ER. Unable to assess site due to bandage and pt discomfort.

## 2018-12-26 NOTE — ED Triage Notes (Signed)
Pt states she had a tummytuck and butt lift due to extreme weight loss and has been having complications due to pain.  States she is having severe pain all over and has not slept.  Is out of her pain medications.  Went to see surgeon today and was told he was not prescribing any further pain medications.

## 2018-12-26 NOTE — ED Provider Notes (Signed)
Emergency Department Provider Note   I have reviewed the triage vital signs and the nursing notes.   HISTORY  Chief Complaint Post-op Problem   HPI Brenda Spencer is a 28 y.o. female with PMH of symptomatic anemia s/p abdomen/buttock plastic surgery with Dr. Towanda Malkin on 12/10/2018 presents to the ED with wound discharge and pain. Denies fever and chills. Patient saw Dr. Towanda Malkin in the office today. She notes worsening pain and drainage from the incision in the LLQ. The wound was opened in the office and patient was started on abx. Patient states that the plan is to f/u with surgery team next week. She reports being prescribed Vicodin for pain, which "does not help." She is requesting Oxycodone but states that her surgeon will not prescribe any additional pain medication. No radiation of symptoms or modifying factors.   Past Medical History:  Diagnosis Date  . Acute post-traumatic headache, not intractable 01/19/2016  . Asthma    ALbuterol inhaler as needed  . CHF (congestive heart failure) ([place]) 2011   RESOLVED; HAD CARDIAC EVAL   . Chronic back pain    reason unknown  . Congestive heart failure ([place]) 2011   related to preeclampsia  . Nocturia   . Noncompliance   . Postgastrectomy malabsorption     Patient Active Problem List   Diagnosis Date Noted  . Symptomatic anemia 12/15/2018  . Acute blood loss anemia 12/15/2018  . Chronic back pain 12/15/2018  . Chronic constipation 12/15/2018  . Hypokalemia 12/15/2018  . Suicidal ideations   . Severe recurrent major depression without psychotic features ([place]) 06/07/2018  . Low back pain 09/30/2016  . Obesity, Class II, BMI 35-39.9 07/20/2016  . Chiari I malformation ([place]) 06/09/2016  . Arnold-Chiari malformation, type I ([place]) 01/20/2016  . Exposure to second hand tobacco smoke 01/19/2016  . Chromosome 1 deletion syndrome   . Parent of child with chromosome abnormality   . History of pregnancy induced hypertension  02/28/2015  . History of congestive heart failure 01/09/2013  . Mild persistent asthma 11/18/2012  . UTI (urinary tract infection) 11/18/2012    Past Surgical History:  Procedure Laterality Date  . BREAST ENHANCEMENT SURGERY    . CHOLECYSTECTOMY     teenager  . SLEEVE GASTROPLASTY  01/18/2017  . SUBOCCIPITAL CRANIECTOMY CERVICAL LAMINECTOMY N/A 06/09/2016   Procedure: Chiari Decompression;  Surgeon: Consuella Lose, MD;  Location: [facility];  Service: Neurosurgery;  Laterality: N/A;  posterior/occipital  . TEAR DUCT PROBING  as a child  . TONSILLECTOMY     as a teenager  . TUBAL LIGATION Bilateral 09/10/2015   Procedure: POST PARTUM TUBAL LIGATION;  Surgeon: Mora Bellman, MD;  Location: [facility];  Service: Gynecology;  Laterality: Bilateral;  . WISDOM TOOTH EXTRACTION     as a teenager    Allergies Metoclopramide and Ondansetron  Family History  Problem Relation Age of Onset  . Learning disabilities Other   . Colon cancer Maternal Grandmother   . Cancer Maternal Grandmother        colon  . Asthma Brother   . Learning disabilities Brother   . Asthma Daughter   . Learning disabilities Daughter   . Seizures Daughter   . Chromosomal disorder Daughter        1q21.1 microdeletion  . Cataracts Daughter        congenital  . Anesthesia problems Neg Hx   . Other Neg Hx     Social History Social History   Tobacco Use  .  Smoking status: Never Smoker  . Smokeless tobacco: Never Used  Substance Use Topics  . Alcohol use: No    Alcohol/week: 0.0 standard drinks  . Drug use: Not Currently    Types: Marijuana    Review of Systems  Constitutional: No fever/chills Eyes: No visual changes. ENT: No sore throat. Cardiovascular: Denies chest pain. Respiratory: Denies shortness of breath. Gastrointestinal: Positive lower abdominal pain.  No nausea, no vomiting.  No diarrhea.  No constipation. Genitourinary: Negative for dysuria. Musculoskeletal: Negative for back  pain. Skin: Positive wound drainage and pain.  Neurological: Negative for headaches, focal weakness or numbness.  10-point ROS otherwise negative.  ____________________________________________   PHYSICAL EXAM:  VITAL SIGNS: ED Triage Vitals  Enc Vitals Group     BP 12/26/18 1815 111/75     Pulse Rate 12/26/18 1815 (!) 108     Resp 12/26/18 1815 20     Temp 12/26/18 1815 98.1 F (36.7 C)     Temp Source 12/26/18 1815 Temporal     SpO2 12/26/18 1815 100 %     Weight 12/26/18 1814 117 lb (53.1 kg)     Height 12/26/18 1814 5' (1.524 m)     Pain Score 12/26/18 1816 10   Constitutional: Alert and oriented. Well appearing and in no acute distress. Eyes: Conjunctivae are normal. Head: Atraumatic. Nose: No congestion/rhinnorhea. Mouth/Throat: Mucous membranes are moist.  Oropharynx non-erythematous. Neck: No stridor.  Cardiovascular: Normal rate, regular rhythm. Good peripheral circulation. Grossly normal heart sounds.   Respiratory: Normal respiratory effort.  No retractions. Lungs CTAB. Gastrointestinal: Soft with diffuse mild/moderate lower abdominal tenderness. Well-appearing LLQ abdominal wound which is open with mild, non-bloody, slightly purulent discharge. No distention.  Musculoskeletal: No lower extremity tenderness nor edema. No gross deformities of extremities. Neurologic:  Normal speech and language. No gross focal neurologic deficits are appreciated.  Skin:  Skin is warm and dry. Incision in the LLQ as described above.   ____________________________________________   LABS (all labs ordered are listed, but only abnormal results are displayed)  Labs Reviewed  CBC WITH DIFFERENTIAL/PLATELET - Abnormal; Notable for the following components:      Result Value   Hemoglobin 10.4 (*)    HCT 34.8 (*)    MCH 25.6 (*)    MCHC 29.9 (*)    RDW 17.3 (*)    Platelets 657 (*)    Abs Immature Granulocytes 0.14 (*)    All other components within normal limits  COMPREHENSIVE  METABOLIC PANEL - Abnormal; Notable for the following components:   Potassium 3.4 (*)    BUN 5 (*)    Calcium 8.2 (*)    Total Protein 6.4 (*)    Albumin 2.8 (*)    All other components within normal limits   ____________________________________________  RADIOLOGY  Ct Abdomen Pelvis W Contrast  Result Date: 12/26/2018 CLINICAL DATA:  Post anterior abdominal wall cause matting surgery. EXAM: CT ABDOMEN AND PELVIS WITH CONTRAST TECHNIQUE: Multidetector CT imaging of the abdomen and pelvis was performed using the standard protocol following bolus administration of intravenous contrast. CONTRAST:  17m OMNIPAQUE IOHEXOL 300 MG/ML  SOLN COMPARISON:  07/31/2017 FINDINGS: Lower chest: No acute abnormality.  Intact breast implants. Hepatobiliary: Hyperenhancing right liver masses, determined to represent benign FNH. Status post cholecystectomy. No biliary dilatation. Pancreas: Unremarkable. No pancreatic ductal dilatation or surrounding inflammatory changes. Spleen: Normal in size without focal abnormality. Adrenals/Urinary Tract: Adrenal glands are unremarkable. Kidneys are normal, without renal calculi, focal lesion, or hydronephrosis. Bladder is unremarkable.  Stomach/Bowel: Post gastric sleeve gastroplasty. No evidence of bowel wall thickening, distention, or inflammatory changes. Vascular/Lymphatic: No significant vascular findings are present. No enlarged abdominal or pelvic lymph nodes. Reproductive: Uterus and bilateral adnexa are unremarkable. Other: Skin thickening, fat stranding, scattered fluid collections and foci of gas are seen along the anterior abdominal wall. Probably postsurgical deformity is noted within the rectus abdominal musculature. One dehiscence in the left lower anterior abdominal wall. Musculoskeletal: No acute or significant osseous findings. IMPRESSION: Skin thickening, subcutaneous fat stranding, scattered nonenhancing fluid collections and extensive foci of gas, along with  wound dehiscence in the anterior abdominal wall suggest of poor healing, possible complicating cellulitis. No drainable fluid collections are seen. No evidence of acute findings within the abdomen or pelvis. Electronically Signed   By: Fidela Salisbury M.D.   On: 12/26/2018 22:39    ____________________________________________   PROCEDURES  Procedure(s) performed:   Procedures  None ____________________________________________   INITIAL IMPRESSION / ASSESSMENT AND PLAN / ED COURSE  Pertinent labs & imaging results that were available during my care of the patient were reviewed by me and considered in my medical decision making (see chart for details).  Patient returns to the ED with abdominal pain and drainage from the wound. No anemia at this time. Patient was prescribed 10/325 Percocet by Surgery team 7 days prior. Patient was evaluated today by Dr. Towanda Malkin. Started abx wound was I&D at the office. CT consistent with exam. No deep-space or tracking infection. No drainable abscess. Patient started on abx today. No concern for developing sepsis. Has close surgery follow up scheduled. Discussed plan to call the surgery team in the AM and return to the ED with any new or worsening symptoms.    ____________________________________________  FINAL CLINICAL IMPRESSION(S) / ED DIAGNOSES  Final diagnoses:  Post-op pain     MEDICATIONS GIVEN DURING THIS VISIT:  Medications  morphine 4 MG/ML injection 4 mg (4 mg Intravenous Given 12/26/18 2157)  iohexol (OMNIPAQUE) 300 MG/ML solution 100 mL (100 mLs Intravenous Contrast Given 12/26/18 2206)     Note:  This document was prepared using Dragon voice recognition software and may include unintentional dictation errors.  Nanda Quinton, MD Emergency Medicine    Long, Wonda [facility], MD 12/27/18 [phone]

## 2018-12-26 NOTE — Discharge Instructions (Signed)
You were seen in the ED today with abdominal pain. Continue the antibiotic

## 2019-01-05 ENCOUNTER — Encounter: Payer: Self-pay | Admitting: Family Medicine

## 2019-01-05 ENCOUNTER — Ambulatory Visit: Payer: Medicaid Other

## 2019-01-05 ENCOUNTER — Ambulatory Visit: Payer: Medicaid Other | Admitting: Family Medicine

## 2019-01-05 VITALS — BP 105/71 | HR 77 | Temp 97.7°F | Ht 60.0 in | Wt 110.0 lb

## 2019-01-05 DIAGNOSIS — L089 Local infection of the skin and subcutaneous tissue, unspecified: Secondary | ICD-10-CM | POA: Diagnosis not present

## 2019-01-05 MED ORDER — CEPHALEXIN 500 MG PO CAPS: 500.0000 mg | ORAL_CAPSULE | Freq: Four times a day (QID) | ORAL | 0 refills | Status: AC

## 2019-01-05 NOTE — Progress Notes (Signed)
Subjective: CC: Soft tissue infection PCP: Eustaquio Maize, MD Brenda Spencer is a 28 y.o. female presenting to clinic today for:  1.  Soft tissue infection Patient is that she was seen by her surgeon yesterday and noted to have a soft tissue infection.  She was prescribed Keflex p.o. twice daily but notes that her insurance will not cover this without it being prescribed by her PCP.  She reports drainage from a postsurgical incision site.  She has had complications with a tummy tuck, which was performed on December 28.  She notes having had transfusion of blood secondary to blood loss related to the tummy tuck.  She does not have follow-up again with her specialist until the second week in February.  Denies any fevers, chills.   ROS: Per HPI  Allergies  Allergen Reactions  . Metoclopramide Other (See Comments) and Swelling    Tardive dyskinesia  . Ondansetron Nausea And Vomiting   Past Medical History:  Diagnosis Date  . Acute post-traumatic headache, not intractable 01/19/2016  . Asthma    ALbuterol inhaler as needed  . CHF (congestive heart failure) ([place]) 2011   RESOLVED; HAD CARDIAC EVAL   . Chronic back pain    reason unknown  . Congestive heart failure ([place]) 2011   related to preeclampsia  . Nocturia   . Noncompliance   . Postgastrectomy malabsorption     Current Outpatient Medications:  .  albuterol (PROVENTIL) (2.5 MG/3ML) 0.083% nebulizer solution, Take 3 mLs (2.5 mg total) by nebulization every 6 (six) hours as needed for wheezing or shortness of breath., Disp: 150 mL, Rfl: 1 .  oxyCODONE-acetaminophen (PERCOCET) 10-325 MG tablet, Take 1 tablet by mouth every 4 (four) hours as needed for pain., Disp: , Rfl:  Social History   Socioeconomic History  . Marital status: Married    Spouse name: CHRISTOPHER  . Number of children: 1  . Years of education: 20  . Highest education level: Not on file  Occupational History  . Occupation: HOMEMAKER  Social Needs    . Financial resource strain: Not on file  . Food insecurity:    Worry: Not on file    Inability: Not on file  . Transportation needs:    Medical: Not on file    Non-medical: Not on file  Tobacco Use  . Smoking status: Never Smoker  . Smokeless tobacco: Never Used  Substance and Sexual Activity  . Alcohol use: No    Alcohol/week: 0.0 standard drinks  . Drug use: Not Currently    Types: Marijuana  . Sexual activity: Yes    Partners: Male    Birth control/protection: Surgical  Lifestyle  . Physical activity:    Days per week: Not on file    Minutes per session: Not on file  . Stress: Not on file  Relationships  . Social connections:    Talks on phone: Not on file    Gets together: Not on file    Attends religious service: Not on file    Active member of club or organization: Not on file    Attends meetings of clubs or organizations: Not on file    Relationship status: Not on file  . Intimate partner violence:    Fear of current or ex partner: Not on file    Emotionally abused: Not on file    Physically abused: Not on file    Forced sexual activity: Not on file  Other Topics Concern  . Not on  file  Social History Narrative   3-4 cups of tea and soda a day    Family History  Problem Relation Age of Onset  . Learning disabilities Other   . Colon cancer Maternal Grandmother   . Cancer Maternal Grandmother        colon  . Asthma Brother   . Learning disabilities Brother   . Asthma Daughter   . Learning disabilities Daughter   . Seizures Daughter   . Chromosomal disorder Daughter        1q21.1 microdeletion  . Cataracts Daughter        congenital  . Anesthesia problems Neg Hx   . Other Neg Hx     Objective: Office vital signs reviewed. BP 105/71   Pulse 77   Temp 97.7 F (36.5 C) (Oral)   Ht 5' (1.524 m)   Wt 110 lb (49.9 kg)   LMP 12/24/2018   BMI 21.48 kg/m   Physical Examination:  General: Awake, alert, nontoxic appearing, No acute distress Skin:  post surgical sites noted.  She has copious drainage but no significant erythema, induration or increased warmth.  Assessment/ Plan: 28 y.o. female   1. Soft tissue infection Somewhat perplexing situation.  She is afebrile nontoxic-appearing.  I have gone ahead and prescribed Keflex 4 times daily for the next 7 days.  Home care instructions reviewed.  Reasons for return discussed.  I have recommend that she follow-up with either her specialist or here for recheck in 1 week.  Reasons for emergent valuation emergency department also reviewed.  She voiced understanding will follow-up as directed.   No orders of the defined types were placed in this encounter.  Meds ordered this encounter  Medications  . cephALEXin (KEFLEX) 500 MG capsule    Sig: Take 1 capsule (500 mg total) by mouth 4 (four) times daily for 7 days.    Dispense:  28 capsule    Refill:  [place], DO [place] [phone]

## 2019-01-05 NOTE — Patient Instructions (Signed)
I have prescribed the Keflex 4 times daily for the next week.  If anything is not moving in the right direction, please seek immediate medical attention.  We discussed that if you develop fevers, worsening drainage, abdominal pain, nausea, chills that this warrants medical attention emergency department.  Please schedule close follow-up with your surgeon and be reevaluated within 1 week.

## 2019-01-13 ENCOUNTER — Ambulatory Visit: Payer: Medicaid Other | Admitting: Family Medicine

## 2019-01-17 ENCOUNTER — Ambulatory Visit: Payer: Medicaid Other | Admitting: Family Medicine

## 2019-01-17 ENCOUNTER — Encounter: Payer: Self-pay | Admitting: Family Medicine

## 2019-01-17 VITALS — BP 107/76 | HR 91 | Temp 98.1°F | Ht 60.0 in | Wt 110.0 lb

## 2019-01-17 DIAGNOSIS — G8929 Other chronic pain: Secondary | ICD-10-CM

## 2019-01-17 DIAGNOSIS — Z09 Encounter for follow-up examination after completed treatment for conditions other than malignant neoplasm: Secondary | ICD-10-CM

## 2019-01-17 DIAGNOSIS — M545 Low back pain: Secondary | ICD-10-CM

## 2019-01-17 NOTE — Progress Notes (Signed)
Subjective: CC: Soft tissue infection PCP: [name], DO Brenda Spencer is a 28 y.o. female presenting to clinic today for:  1.  Soft tissue infection Patient was seen 1 week ago for soft tissue infection.  She was started on Keflex 4 times daily and instructed to follow-up with her PCP/plastic surgeon.  She reports significant improvement in drainage, pain and redness.  She has completed Keflex.  She has follow-up with her surgeon in the next couple of weeks.  No fevers.  2.  Chronic back pain Patient reports chronic bilateral low back pain that is been ongoing for at least a year now.  She denies any preceding injury.  She notes that pain is worse with certain positions like prolonged standing or sitting in certain positions.  She denies any lower extremity weakness, numbness, tingling.  No saddle anesthesia, fecal incontinence or urinary retention.  She has been treated with muscle relaxers in the past which did not help.  She is unable to take oral NSAIDs per her report because of history of gastric sleeve.  She uses Tylenol with no improvement in symptoms.  She also has been on Percocet but is out of this medicine.  This was prescribed postsurgically for her tummy tuck.  Additionally, she has gone to physical therapy but this has not helped.  She has never seen a back specialist but would be interested in seeing one.  ROS: Per HPI  Allergies  Allergen Reactions  . Metoclopramide Other (See Comments) and Swelling    Tardive dyskinesia  . Ondansetron Nausea And Vomiting   Past Medical History:  Diagnosis Date  . Acute post-traumatic headache, not intractable 01/19/2016  . Asthma    ALbuterol inhaler as needed  . CHF (congestive heart failure) ([place]) 2011   RESOLVED; HAD CARDIAC EVAL   . Chronic back pain    reason unknown  . Congestive heart failure ([place]) 2011   related to preeclampsia  . Nocturia   . Noncompliance   . Postgastrectomy malabsorption     Current  Outpatient Medications:  .  albuterol (PROVENTIL) (2.5 MG/3ML) 0.083% nebulizer solution, Take 3 mLs (2.5 mg total) by nebulization every 6 (six) hours as needed for wheezing or shortness of breath., Disp: 150 mL, Rfl: 1 .  oxyCODONE-acetaminophen (PERCOCET) 10-325 MG tablet, Take 1 tablet by mouth every 4 (four) hours as needed for pain., Disp: , Rfl:  Social History   Socioeconomic History  . Marital status: Married    Spouse name: CHRISTOPHER  . Number of children: 1  . Years of education: 85  . Highest education level: Not on file  Occupational History  . Occupation: HOMEMAKER  Social Needs  . Financial resource strain: Not on file  . Food insecurity:    Worry: Not on file    Inability: Not on file  . Transportation needs:    Medical: Not on file    Non-medical: Not on file  Tobacco Use  . Smoking status: Never Smoker  . Smokeless tobacco: Never Used  Substance and Sexual Activity  . Alcohol use: No    Alcohol/week: 0.0 standard drinks  . Drug use: Not Currently    Types: Marijuana  . Sexual activity: Yes    Partners: Male    Birth control/protection: Surgical  Lifestyle  . Physical activity:    Days per week: Not on file    Minutes per session: Not on file  . Stress: Not on file  Relationships  . Social connections:  Talks on phone: Not on file    Gets together: Not on file    Attends religious service: Not on file    Active member of club or organization: Not on file    Attends meetings of clubs or organizations: Not on file    Relationship status: Not on file  . Intimate partner violence:    Fear of current or ex partner: Not on file    Emotionally abused: Not on file    Physically abused: Not on file    Forced sexual activity: Not on file  Other Topics Concern  . Not on file  Social History Narrative   3-4 cups of tea and soda a day    Family History  Problem Relation Age of Onset  . Learning disabilities Other   . Colon cancer Maternal Grandmother    . Cancer Maternal Grandmother        colon  . Asthma Brother   . Learning disabilities Brother   . Asthma Daughter   . Learning disabilities Daughter   . Seizures Daughter   . Chromosomal disorder Daughter        1q21.1 microdeletion  . Cataracts Daughter        congenital  . Anesthesia problems Neg Hx   . Other Neg Hx     Objective: Office vital signs reviewed. BP 107/76   Pulse 91   Temp 98.1 F (36.7 C) (Oral)   Ht 5' (1.524 m)   Wt 110 lb (49.9 kg)   LMP 12/24/2018   BMI 21.48 kg/m   Physical Examination:  General: Awake, alert, nontoxic appearing, No acute distress Skin: post surgical sites noted.  No significant drainage.  No erythema, induration or increased warmth. MSK:   Lumbar spine: Patient has full active range of motion in all planes.  She has no tenderness to palpation in the midline but does have paraspinal muscle tenderness to palpation right greater than left at around T10-L2.  No palpable bony abnormalities.   Neuro: Light touch and station grossly intact  Assessment/ Plan: 28 y.o. female   1. Chronic bilateral low back pain without sciatica Likely muscular in nature.  I referred her plain films from October 2018 of the lumbar spine and February 2019 of the thoracic spine.  There were no significant degenerative changes within these imaging studies.  I have placed a referral to the orthopedist for further evaluation and management given refractory nature of pain despite conservative therapies and physical therapy.  2. S/P abdominal surgery, follow-up exam Soft tissue infection looks to have resolved.  Keep appointment with general surgery.   Orders Placed This Encounter  Procedures  . Ambulatory referral to Orthopedic Surgery    Referral Priority:   Routine    Referral Type:   Surgical    Referral Reason:   Specialty Services Required    Requested Specialty:   Orthopedic Surgery    Number of Visits Requested:   1   No orders of the defined  types were placed in this encounter.    [name], DO [place] [phone]

## 2019-01-24 ENCOUNTER — Ambulatory Visit (INDEPENDENT_AMBULATORY_CARE_PROVIDER_SITE_OTHER): Payer: Medicaid Other | Admitting: Family Medicine

## 2019-02-01 ENCOUNTER — Ambulatory Visit (INDEPENDENT_AMBULATORY_CARE_PROVIDER_SITE_OTHER): Payer: Medicaid Other | Admitting: Family Medicine

## 2019-02-02 ENCOUNTER — Ambulatory Visit (INDEPENDENT_AMBULATORY_CARE_PROVIDER_SITE_OTHER): Payer: Medicaid Other | Admitting: Family Medicine

## 2019-02-08 ENCOUNTER — Ambulatory Visit (INDEPENDENT_AMBULATORY_CARE_PROVIDER_SITE_OTHER): Payer: Medicaid Other | Admitting: Family Medicine

## 2019-02-08 ENCOUNTER — Encounter (INDEPENDENT_AMBULATORY_CARE_PROVIDER_SITE_OTHER): Payer: Self-pay | Admitting: Family Medicine

## 2019-02-08 DIAGNOSIS — M545 Low back pain, unspecified: Secondary | ICD-10-CM

## 2019-02-08 DIAGNOSIS — G8929 Other chronic pain: Secondary | ICD-10-CM | POA: Diagnosis not present

## 2019-02-08 NOTE — Progress Notes (Signed)
Office Visit Note   Patient: Brenda Spencer           Date of Birth: [birth year]           MRN: [id number] Visit Date: 02/08/2019 Requested by: Brenda Norlander, DO [place], [place] [address] PCP: Brenda Norlander, DO  Subjective: Chief Complaint  Patient presents with  . Lower Back - Pain    Chronic low back pain -  "has never gone away." NKI. Pain across lower back, but mainly on right side. Occasionally radiates down the right leg, occ. N/t, occ. Weakness in the right leg.    HPI: She is a 28 year old with right-sided low back pain.  Chronic pain for at least a year, no definite injury.  She was morbidly obese and thought it might be contributing to her pain.  She underwent bariatric surgery and lost more than 100 pounds but continues to have the same pain.  She went to physical therapy and doing home exercises, but that did not help.  She has tried muscle relaxants with no improvement.  Pain is primarily in the right lower back with occasional radiation into the right leg.  Sometimes it keeps her from sleeping.  Denies any bowel or bladder dysfunction.  Recent labs showed hypocalcemia but follow-up lab a couple weeks later showed improvement but not resolution of that.               ROS: She has a history of Chiari I malformation.  Other systems were reviewed and are negative.  Objective: Vital Signs: There were no vitals taken for this visit.  Physical Exam:  Low back: She appears to have a leg length discrepancy with right leg longer than the left, primarily in the femur.  I would estimate about 2 cm of difference.  There is no significant scoliosis.  She is moderately tender in the right sided upper lumbar paraspinous muscles and in the right quadratus lumborum.  No tenderness at the SI joint or in the sciatic notch, negative straight leg raise, lower extremity strength and reflexes are normal.  Imaging: None today.  Previous lumbar x-rays reviewed are  unremarkable.  Assessment & Plan: 1.  Chronic right-sided lower back pain, seems to be mechanical.  I question whether it is related to her leg length discrepancy. -MRI to further evaluate.  If unremarkable, then we will try heel lift in the left shoe along with possibly another trial of physical therapy for myofascial release techniques. - Could consider chiropractic as well. -Would also suggest rechecking calcium levels and if still low, do additional work-up including vitamin D level and PTH.     Procedures: No procedures performed  No notes on file     PMFS History: Patient Active Problem List   Diagnosis Date Noted  . Symptomatic anemia 12/15/2018  . Acute blood loss anemia 12/15/2018  . Chronic back pain 12/15/2018  . Chronic constipation 12/15/2018  . Hypokalemia 12/15/2018  . Suicidal ideations   . Severe recurrent major depression without psychotic features ([place]) 06/07/2018  . Low back pain 09/30/2016  . Obesity, Class II, BMI 35-39.9 07/20/2016  . Chiari I malformation ([place]) 06/09/2016  . Arnold-Chiari malformation, type I ([place]) 01/20/2016  . Exposure to second hand tobacco smoke 01/19/2016  . Chromosome 1 deletion syndrome   . Parent of child with chromosome abnormality   . History of pregnancy induced hypertension 02/28/2015  . History of congestive heart failure 01/09/2013  . Mild persistent  asthma 11/18/2012  . UTI (urinary tract infection) 11/18/2012   Past Medical History:  Diagnosis Date  . Acute post-traumatic headache, not intractable 01/19/2016  . Asthma    ALbuterol inhaler as needed  . CHF (congestive heart failure) ([place]) 2011   RESOLVED; HAD CARDIAC EVAL   . Chronic back pain    reason unknown  . Congestive heart failure ([place]) 2011   related to preeclampsia  . Nocturia   . Noncompliance   . Postgastrectomy malabsorption     Family History  Problem Relation Age of Onset  . Learning disabilities Other   . Colon cancer Maternal Grandmother     . Cancer Maternal Grandmother        colon  . Asthma Brother   . Learning disabilities Brother   . Asthma Daughter   . Learning disabilities Daughter   . Seizures Daughter   . Chromosomal disorder Daughter        1q21.1 microdeletion  . Cataracts Daughter        congenital  . Anesthesia problems Neg Hx   . Other Neg Hx     Past Surgical History:  Procedure Laterality Date  . BREAST ENHANCEMENT SURGERY    . CHOLECYSTECTOMY     teenager  . SLEEVE GASTROPLASTY  01/18/2017  . SUBOCCIPITAL CRANIECTOMY CERVICAL LAMINECTOMY N/A 06/09/2016   Procedure: Chiari Decompression;  Surgeon: Consuella Lose, MD;  Location: [facility];  Service: Neurosurgery;  Laterality: N/A;  posterior/occipital  . TEAR DUCT PROBING  as a child  . TONSILLECTOMY     as a teenager  . TUBAL LIGATION Bilateral 09/10/2015   Procedure: POST PARTUM TUBAL LIGATION;  Surgeon: Mora Bellman, MD;  Location: [facility];  Service: Gynecology;  Laterality: Bilateral;  . WISDOM TOOTH EXTRACTION     as a teenager   Social History   Occupational History  . Occupation: HOMEMAKER  Tobacco Use  . Smoking status: Never Smoker  . Smokeless tobacco: Never Used  Substance and Sexual Activity  . Alcohol use: No    Alcohol/week: 0.0 standard drinks  . Drug use: Not Currently    Types: Marijuana  . Sexual activity: Yes    Partners: Male    Birth control/protection: Surgical

## 2019-02-19 ENCOUNTER — Ambulatory Visit
Admission: RE | Admit: 2019-02-19 | Discharge: 2019-02-19 | Disposition: A | Discharge status: Home / Self Care | Payer: Medicaid Other | Source: Ambulatory Visit | Attending: Family Medicine | Admitting: Family Medicine

## 2019-02-19 DIAGNOSIS — M545 Low back pain, unspecified: Secondary | ICD-10-CM

## 2019-02-19 DIAGNOSIS — G8929 Other chronic pain: Secondary | ICD-10-CM

## 2019-02-20 ENCOUNTER — Telehealth (INDEPENDENT_AMBULATORY_CARE_PROVIDER_SITE_OTHER): Payer: Self-pay | Admitting: Family Medicine

## 2019-02-20 NOTE — Telephone Encounter (Signed)
Lumbar MRI looks normal.

## 2019-03-20 ENCOUNTER — Telehealth: Payer: Self-pay | Admitting: Family Medicine

## 2019-03-20 NOTE — Telephone Encounter (Signed)
lmtcb

## 2019-03-20 NOTE — Telephone Encounter (Signed)
Patient is going to call Ortho since that is who ordered MR Lumbar.  Patient wanted Dr. Darnell Level to know that she got labs in the hospital  12/26/18 and her calcium was low but it has been low for the past 3 months. Patient has been taking OTC Vit D and prenatal Vit to help with hair and nails.  FYI

## 2019-04-10 ENCOUNTER — Other Ambulatory Visit: Payer: Self-pay

## 2019-04-25 ENCOUNTER — Ambulatory Visit (INDEPENDENT_AMBULATORY_CARE_PROVIDER_SITE_OTHER): Payer: Medicaid Other | Admitting: Family Medicine

## 2019-04-25 ENCOUNTER — Other Ambulatory Visit: Payer: Self-pay

## 2019-04-25 ENCOUNTER — Ambulatory Visit: Payer: Medicaid Other | Admitting: Family

## 2019-04-25 DIAGNOSIS — N3000 Acute cystitis without hematuria: Secondary | ICD-10-CM | POA: Diagnosis not present

## 2019-04-25 MED ORDER — FLUCONAZOLE 150 MG PO TABS: 150.0000 mg | ORAL_TABLET | Freq: Once | ORAL | 0 refills | Status: AC

## 2019-04-25 MED ORDER — CEPHALEXIN 500 MG PO CAPS: 500.0000 mg | ORAL_CAPSULE | Freq: Two times a day (BID) | ORAL | 0 refills | Status: AC

## 2019-04-25 NOTE — Progress Notes (Signed)
Telephone visit  Subjective: CC: UTI PCP: Brenda Norlander, DO [name] is a 28 y.o. female calls for telephone consult today. Patient provides verbal consent for consult held via phone.  Location of patient: doctor's office Location of provider: Working remotely from home Others present for call: none  1. Urinary symptoms Patient reports a 1 day h/o urinary frequency, urgency, and mild dysuria. No hematuria, fevers, chills, abdominal pain, nausea, vomiting, new back pain, vaginal discharge.  Patient has used nothing for symptoms.  Patient denies a h/o frequent or recurrent UTIs.      ROS: Per HPI  Allergies  Allergen Reactions  . Metoclopramide Other (See Comments) and Swelling    Tardive dyskinesia  . Ondansetron Nausea And Vomiting   Past Medical History:  Diagnosis Date  . Acute post-traumatic headache, not intractable 01/19/2016  . Asthma    ALbuterol inhaler as needed  . CHF (congestive heart failure) ([place]) 2011   RESOLVED; HAD CARDIAC EVAL   . Chronic back pain    reason unknown  . Congestive heart failure ([place]) 2011   related to preeclampsia  . Nocturia   . Noncompliance   . Postgastrectomy malabsorption    No current outpatient medications on file.  Assessment/ Plan: 28 y.o. female   1. Acute cystitis without hematuria Symptoms are consistent with acute cystitis.  Start Keflex p.o. twice daily for 7 days.  Diflucan sent in as needed yeast infection.  Home care directions reviewed and reasons for urgent/emergent evaluation discussed.  Patient voiced good understanding will follow-up PRN - cephALEXin (KEFLEX) 500 MG capsule; Take 1 capsule (500 mg total) by mouth 2 (two) times daily for 7 days.  Dispense: 14 capsule; Refill: 0 - fluconazole (DIFLUCAN) 150 MG tablet; Take 1 tablet (150 mg total) by mouth once for 1 dose.  Dispense: 1 tablet; Refill: 0   Start time: 11:11am End time: 11:16am  Total time spent on patient care (including  telephone call/ virtual visit): 10 minutes  [place], [place] [phone]

## 2019-05-22 ENCOUNTER — Other Ambulatory Visit: Payer: Self-pay

## 2019-05-22 ENCOUNTER — Ambulatory Visit (INDEPENDENT_AMBULATORY_CARE_PROVIDER_SITE_OTHER): Payer: Medicaid Other | Admitting: Family Medicine

## 2019-05-22 ENCOUNTER — Encounter: Payer: Self-pay | Admitting: Family Medicine

## 2019-05-22 DIAGNOSIS — B373 Candidiasis of vulva and vagina: Secondary | ICD-10-CM

## 2019-05-22 DIAGNOSIS — B3731 Acute candidiasis of vulva and vagina: Secondary | ICD-10-CM

## 2019-05-22 MED ORDER — FLUCONAZOLE 150 MG PO TABS: 150.0000 mg | ORAL_TABLET | Freq: Once | ORAL | 1 refills | Status: AC

## 2019-05-22 NOTE — Progress Notes (Signed)
   Virtual Visit via telephone Note  I connected with Brenda Spencer on 05/22/19 at 0915 by telephone and verified that I am speaking with the correct person using two identifiers. Brenda Spencer is currently located at home and no other people are currently with her during visit. The provider, Fransisca Kaufmann Calen Posch, MD is located in their office at time of visit.  Call ended at [phone]  I discussed the limitations, risks, security and privacy concerns of performing an evaluation and management service by telephone and the availability of in person appointments. I also discussed with the patient that there may be a patient responsible charge related to this service. The patient expressed understanding and agreed to proceed.   History and Present Illness: Patient is calling in for some yeast problems down in vaginal area.  Patient denies any discharge.  Patient denies any urinary burning or abdominal pain. Already tried monistat and didn't work and tried taking some Keflex that she had leftover and that did not work as well.  She says the pain and irritation is in the vaginal region and at the urethral opening and she thinks it started because of all the heat over the last weekend.  She says is been going on for about 3 days now since the hot weather came on.  She denies any fevers or chills or flank pain or abdominal pain today.  No diagnosis found.  No outpatient encounter medications on file as of 05/22/2019.   No facility-administered encounter medications on file as of 05/22/2019.     Review of Systems  Constitutional: Negative for chills and fever.  Eyes: Negative for visual disturbance.  Respiratory: Negative for chest tightness and shortness of breath.   Cardiovascular: Negative for chest pain and leg swelling.  Gastrointestinal: Negative for abdominal pain.  Genitourinary: Positive for vaginal discharge and vaginal pain. Negative for difficulty urinating, dysuria, flank pain,  frequency, hematuria, urgency and vaginal bleeding.  Musculoskeletal: Negative for back pain and gait problem.  Skin: Negative for rash.  Neurological: Negative for light-headedness and headaches.  Psychiatric/Behavioral: Negative for agitation and behavioral problems.  All other systems reviewed and are negative.   Observations/Objective: Patient sounds comfortable on the phone and in no acute distress  Assessment and Plan: Problem List Items Addressed This Visit    None    Visit Diagnoses    Yeast vaginitis    -  Primary   Relevant Medications   fluconazole (DIFLUCAN) 150 MG tablet       Follow Up Instructions: As needed    I discussed the assessment and treatment plan with the patient. The patient was provided an opportunity to ask questions and all were answered. The patient agreed with the plan and demonstrated an understanding of the instructions.   The patient was advised to call back or seek an in-person evaluation if the symptoms worsen or if the condition fails to improve as anticipated.  The above assessment and management plan was discussed with the patient. The patient verbalized understanding of and has agreed to the management plan. Patient is aware to call the clinic if symptoms persist or worsen. Patient is aware when to return to the clinic for a follow-up visit. Patient educated on when it is appropriate to go to the emergency department.    I provided 6 minutes of non-face-to-face time during this encounter.    Brenda Rancher, MD

## 2019-05-29 ENCOUNTER — Encounter: Payer: Self-pay | Admitting: Family

## 2019-05-29 ENCOUNTER — Other Ambulatory Visit: Payer: Self-pay

## 2019-05-29 ENCOUNTER — Ambulatory Visit: Payer: Medicaid Other | Admitting: Family

## 2019-05-29 VITALS — BP 115/77 | HR 59 | Temp 98.5°F | Ht 60.0 in | Wt 128.0 lb

## 2019-05-29 DIAGNOSIS — B373 Candidiasis of vulva and vagina: Secondary | ICD-10-CM | POA: Diagnosis not present

## 2019-05-29 DIAGNOSIS — R109 Unspecified abdominal pain: Secondary | ICD-10-CM | POA: Diagnosis not present

## 2019-05-29 DIAGNOSIS — N3000 Acute cystitis without hematuria: Secondary | ICD-10-CM

## 2019-05-29 DIAGNOSIS — B3731 Acute candidiasis of vulva and vagina: Secondary | ICD-10-CM

## 2019-05-29 LAB — URINALYSIS, COMPLETE
Bilirubin, UA: NEGATIVE
Glucose, UA: NEGATIVE
Ketones, UA: NEGATIVE
Nitrite, UA: NEGATIVE
Protein,UA: NEGATIVE
RBC, UA: NEGATIVE
Specific Gravity, UA: 1.025 (ref 1.005–1.030)
Urobilinogen, Ur: 2 mg/dL — ABNORMAL HIGH (ref 0.2–1.0)
pH, UA: 6 (ref 5.0–7.5)

## 2019-05-29 LAB — MICROSCOPIC EXAMINATION: Renal Epithel, UA: NONE SEEN /hpf

## 2019-05-29 MED ORDER — SULFAMETHOXAZOLE-TRIMETHOPRIM 800-160 MG PO TABS: 1.0000 | ORAL_TABLET | Freq: Two times a day (BID) | ORAL | 0 refills | Status: DC

## 2019-05-29 MED ORDER — BACLOFEN 10 MG PO TABS: 10.0000 mg | ORAL_TABLET | Freq: Three times a day (TID) | ORAL | 0 refills | Status: DC

## 2019-05-29 MED ORDER — FLUCONAZOLE 150 MG PO TABS: 150.0000 mg | ORAL_TABLET | ORAL | 0 refills | Status: DC | PRN

## 2019-05-29 MED ORDER — DICLOFENAC SODIUM 75 MG PO TBEC: 75.0000 mg | DELAYED_RELEASE_TABLET | Freq: Two times a day (BID) | ORAL | 0 refills | Status: DC

## 2019-05-29 NOTE — Progress Notes (Signed)
Subjective:    Patient ID: Brenda Spencer, female    DOB: January 08, 1991, 28 y.o.   MRN: [id number]  Chief Complaint  Patient presents with  . Back Pain    Back Pain This is a recurrent problem. The current episode started more than 1 year ago. The problem occurs intermittently. The problem has been waxing and waning since onset. Pain location: right flank. The quality of the pain is described as aching. The pain does not radiate. The pain is at a severity of 10/10. The pain is moderate. The symptoms are aggravated by bending, twisting and standing. Associated symptoms include leg pain (some times). Pertinent negatives include no bladder incontinence, bowel incontinence, numbness or weakness. Risk factors include sedentary lifestyle and obesity. She has tried analgesics, bed rest and muscle relaxant for the symptoms. The treatment provided mild relief.      Review of Systems  Gastrointestinal: Negative for bowel incontinence.  Genitourinary: Negative for bladder incontinence.  Musculoskeletal: Positive for back pain.  Neurological: Negative for weakness and numbness.  All other systems reviewed and are negative.      Objective:   Physical Exam Vitals signs reviewed.  Constitutional:      General: She is not in acute distress.    Appearance: She is well-developed.  HENT:     Head: Normocephalic and atraumatic.  Eyes:     Pupils: Pupils are equal, round, and reactive to light.  Neck:     Musculoskeletal: Normal range of motion and neck supple.     Thyroid: No thyromegaly.  Cardiovascular:     Rate and Rhythm: Normal rate and regular rhythm.     Heart sounds: Normal heart sounds. No murmur.  Pulmonary:     Effort: Pulmonary effort is normal. No respiratory distress.     Breath sounds: Normal breath sounds. No wheezing.  Abdominal:     General: Bowel sounds are normal. There is no distension.     Palpations: Abdomen is soft.     Tenderness: There is no abdominal  tenderness.  Musculoskeletal:        General: No tenderness.     Comments: Right flank pain with flexion, negative CVA tenderness  Skin:    General: Skin is warm and dry.  Neurological:     Mental Status: She is alert and oriented to person, place, and time.     Cranial Nerves: No cranial nerve deficit.     Deep Tendon Reflexes: Reflexes are normal and symmetric.  Psychiatric:        Behavior: Behavior normal.        Thought Content: Thought content normal.        Judgment: Judgment normal.       BP 115/77   Pulse (!) 59   Temp 98.5 F (36.9 C) (Oral)   Ht 5' (1.524 m)   Wt 128 lb (58.1 kg)   BMI 25.00 kg/m      Assessment & Plan:  Brenda Spencer comes in today with chief complaint of Back Pain   Diagnosis and orders addressed:  1. Flank pain - Urinalysis, Complete - Urine Culture - diclofenac (VOLTAREN) 75 MG EC tablet; Take 1 tablet (75 mg total) by mouth 2 (two) times daily.  Dispense: 30 tablet; Refill: 0 - baclofen (LIORESAL) 10 MG tablet; Take 1 tablet (10 mg total) by mouth 3 (three) times daily.  Dispense: 30 each; Refill: 0  2. Acute cystitis without hematuria Force fluids AZO over the counter X2 days  RTO if symptoms worsen or do not improve  Culture pending - Urine Culture - sulfamethoxazole-trimethoprim (BACTRIM DS) 800-160 MG tablet; Take 1 tablet by mouth 2 (two) times daily.  Dispense: 14 tablet; Refill: 0  3. Candidal vaginitis Do not scratch Keep clean and dry - fluconazole (DIFLUCAN) 150 MG tablet; Take 1 tablet (150 mg total) by mouth every three (3) days as needed.  Dispense: 3 tablet; Refill: 0   Evelina Dun, FNP

## 2019-05-29 NOTE — Patient Instructions (Signed)
Asymptomatic Bacteriuria  Asymptomatic bacteriuria is the presence of a large number of bacteria in the urine without the usual symptoms of burning or frequent urination. What are the causes? This condition is caused by an increase in bacteria in the urine. This increase can be caused by:  Bacteria entering the urinary tract, such as during sex.  A blockage in the urinary tract, such as from kidney stones or a tumor.  Bladder problems that prevent the bladder from emptying. What increases the risk? You are more likely to develop this condition if:  You have diabetes mellitus.  You are an elderly adult, especially if you are also in a long-term care facility.  You are pregnant and in the first trimester.  You have kidney stones.  You are female.  You have had a kidney transplant.  You have a leaky kidney tube valve (reflux).  You had a urinary catheter for a long period of time. What are the signs or symptoms? There are no symptoms of this condition. How is this diagnosed? This condition is diagnosed with a urine test. Because this condition does not cause symptoms, it is usually diagnosed when a urine sample is taken to treat or diagnose another condition, such as pregnancy or kidney problems. Most women who are in their first trimester of pregnancy are screened for asymptomatic bacteriuria. How is this treated? Usually, treatment is not needed for this condition. Treating the condition can lead to other problems, such as a yeast infection or the growth of bacteria that do not respond to treatment (antibiotic-resistant bacteria). Some people, such as pregnant women and people with kidney transplants, do need treatment with antibiotic medicines to prevent kidney infection (pyelonephritis). In pregnant women, kidney infection can lead to premature labor, fetal growth restriction, or newborn death. Follow these instructions at home: Medicines  Take over-the-counter and prescription  medicines only as told by your health care provider.  If you were prescribed an antibiotic medicine, take it as told by your health care provider. Do not stop taking the antibiotic even if you start to feel better. General instructions  Monitor your condition for any changes.  Drink enough fluid to keep your urine clear or pale yellow.  Go to the bathroom more often to keep your bladder empty.  If you are female, keep the area around your vagina and rectum clean. Wipe yourself from front to back after urinating.  Keep all follow-up visits as told by your health care provider. This is important. Contact a health care provider if:  You notice any new symptoms, such as back pain or burning while urinating. Get help right away if:  You develop signs of an infection such as: ? A burning sensation when you urinate. ? Have pain when you urinate. ? Develop an intense need to urinate. ? Urinating more frequently. ? Back pain or pelvic pain. ? Fever or chills.  You have blood in your urine.  Your urine becomes discolored or cloudy.  Your urine smells bad.  You have severe pain that cannot be controlled with medicine. Summary  Asymptomatic bacteriuria is the presence of a large number of bacteria in the urine without the usual symptoms of burning or frequent urination.  Usually, treatment is not needed for this condition. Treating the condition can lead to other problems, such as too much yeast and the growth of antibiotic-resistant bacteria.  Some people, such as pregnant women and people with kidney transplants, do need treatment with antibiotic medicines to prevent kidney   infection (pyelonephritis).  If you were prescribed an antibiotic medicine, take it as told by your health care provider. Do not stop taking the antibiotic even if you start to feel better. This information is not intended to replace advice given to you by your health care provider. Make sure you discuss any  questions you have with your health care provider. Document Released: 11/30/2005 Document Revised: 11/24/2016 Document Reviewed: 11/24/2016 Elsevier Interactive Patient Education  2019 [employer]  

## 2019-05-31 ENCOUNTER — Telehealth: Payer: Self-pay

## 2019-05-31 LAB — URINE CULTURE

## 2019-05-31 NOTE — Telephone Encounter (Signed)
Patient's Medicaid will not pay for Diclofenac.  Covered alternatives are:  Ibuprofen tablet Indomethacin capsule Ketorolac tablet Meloxicam Tablet Naproxen EC tablet Naproxen tablet Sulindac tablet

## 2019-06-01 MED ORDER — NAPROXEN 500 MG PO TABS: 500.0000 mg | ORAL_TABLET | Freq: Two times a day (BID) | ORAL | 1 refills | Status: DC

## 2019-06-01 NOTE — Telephone Encounter (Signed)
Prescription sent to pharmacy.

## 2019-06-16 ENCOUNTER — Encounter (HOSPITAL_BASED_OUTPATIENT_CLINIC_OR_DEPARTMENT_OTHER): Payer: Self-pay | Admitting: *Deleted

## 2019-06-16 ENCOUNTER — Emergency Department (HOSPITAL_BASED_OUTPATIENT_CLINIC_OR_DEPARTMENT_OTHER)
Admission: EM | Admit: 2019-06-16 | Discharge: 2019-06-16 | Disposition: A | Discharge status: Home / Self Care | Payer: Medicaid Other | Attending: Emergency Medicine | Admitting: Emergency Medicine

## 2019-06-16 ENCOUNTER — Encounter: Payer: Self-pay | Admitting: Family Medicine

## 2019-06-16 ENCOUNTER — Other Ambulatory Visit: Payer: Self-pay

## 2019-06-16 ENCOUNTER — Ambulatory Visit (INDEPENDENT_AMBULATORY_CARE_PROVIDER_SITE_OTHER): Payer: Medicaid Other | Admitting: Family Medicine

## 2019-06-16 ENCOUNTER — Emergency Department (HOSPITAL_BASED_OUTPATIENT_CLINIC_OR_DEPARTMENT_OTHER): Payer: Medicaid Other

## 2019-06-16 ENCOUNTER — Ambulatory Visit: Payer: Medicaid Other | Admitting: Family Medicine

## 2019-06-16 DIAGNOSIS — I509 Heart failure, unspecified: Secondary | ICD-10-CM | POA: Insufficient documentation

## 2019-06-16 DIAGNOSIS — J45909 Unspecified asthma, uncomplicated: Secondary | ICD-10-CM | POA: Insufficient documentation

## 2019-06-16 DIAGNOSIS — R109 Unspecified abdominal pain: Secondary | ICD-10-CM

## 2019-06-16 DIAGNOSIS — R1031 Right lower quadrant pain: Secondary | ICD-10-CM | POA: Insufficient documentation

## 2019-06-16 LAB — URINALYSIS, ROUTINE W REFLEX MICROSCOPIC
Bilirubin Urine: NEGATIVE
Glucose, UA: NEGATIVE mg/dL
Hgb urine dipstick: NEGATIVE
Ketones, ur: NEGATIVE mg/dL
Leukocytes,Ua: NEGATIVE
Nitrite: NEGATIVE
Protein, ur: NEGATIVE mg/dL
Specific Gravity, Urine: >1.03 — ABNORMAL HIGH (ref 1.005–1.030)
pH: 6 (ref 5.0–8.0)

## 2019-06-16 LAB — PREGNANCY, URINE: Preg Test, Ur: NEGATIVE

## 2019-06-16 MED ORDER — HYDROCODONE-ACETAMINOPHEN 5-325 MG PO TABS
2.0000 | ORAL_TABLET | Freq: Once | ORAL | Status: AC
  Administered 2019-06-16: 2 via ORAL
  Filled 2019-06-16: qty 2

## 2019-06-16 NOTE — ED Notes (Signed)
Pt reports feeling much better, ready for discharge.  Awaiting discharge instructions.

## 2019-06-16 NOTE — Discharge Instructions (Addendum)
Use Tylenol and ice as needed for pain. Return for new or worsening symptoms.

## 2019-06-16 NOTE — ED Triage Notes (Signed)
Pt c/o right lower back pain x 8 hrs

## 2019-06-16 NOTE — Progress Notes (Signed)
Visit cancelled. Pt. On herway to the emergency room due to severity of pain. Claretta Fraise, MD

## 2019-06-16 NOTE — ED Provider Notes (Signed)
[facility] EMERGENCY DEPARTMENT Provider Note   CSN: [id number] Arrival date & time: 06/16/19  1428     History   Chief Complaint Chief Complaint  Patient presents with  . Back Pain    HPI Brenda Spencer is a 28 y.o. female.     With history of gastrectomy, anemia, chronic back pain presents with right flank pain worse than her normal.  Sudden onset, sharp, constant.  No history of kidney stones.  No injuries.  Patient's had muscle spasms in the past.  No neurologic symptoms.     Past Medical History:  Diagnosis Date  . Acute post-traumatic headache, not intractable 01/19/2016  . Asthma    ALbuterol inhaler as needed  . CHF (congestive heart failure) ([place]) 2011   RESOLVED; HAD CARDIAC EVAL   . Chronic back pain    reason unknown  . Congestive heart failure ([place]) 2011   related to preeclampsia  . Nocturia   . Noncompliance   . Postgastrectomy malabsorption     Patient Active Problem List   Diagnosis Date Noted  . Symptomatic anemia 12/15/2018  . Acute blood loss anemia 12/15/2018  . Chronic back pain 12/15/2018  . Chronic constipation 12/15/2018  . Hypokalemia 12/15/2018  . Suicidal ideations   . Severe recurrent major depression without psychotic features ([place]) 06/07/2018  . Low back pain 09/30/2016  . Obesity, Class II, BMI 35-39.9 07/20/2016  . Chiari I malformation ([place]) 06/09/2016  . Arnold-Chiari malformation, type I ([place]) 01/20/2016  . Exposure to second hand tobacco smoke 01/19/2016  . Chromosome 1 deletion syndrome   . Parent of child with chromosome abnormality   . History of pregnancy induced hypertension 02/28/2015  . History of congestive heart failure 01/09/2013  . Mild persistent asthma 11/18/2012  . UTI (urinary tract infection) 11/18/2012    Past Surgical History:  Procedure Laterality Date  . BREAST ENHANCEMENT SURGERY    . CHOLECYSTECTOMY     teenager  . SLEEVE GASTROPLASTY  01/18/2017  . SUBOCCIPITAL CRANIECTOMY  CERVICAL LAMINECTOMY N/A 06/09/2016   Procedure: Chiari Decompression;  Surgeon: Consuella Lose, MD;  Location: [facility];  Service: Neurosurgery;  Laterality: N/A;  posterior/occipital  . TEAR DUCT PROBING  as a child  . TONSILLECTOMY     as a teenager  . TUBAL LIGATION Bilateral 09/10/2015   Procedure: POST PARTUM TUBAL LIGATION;  Surgeon: Mora Bellman, MD;  Location: [facility];  Service: Gynecology;  Laterality: Bilateral;  . WISDOM TOOTH EXTRACTION     as a teenager     OB History    Gravida  5   Para  4   Term  3   Preterm  1   AB  1   Living  4     SAB  1   TAB      Ectopic      Multiple  0   Live Births  4        Obstetric Comments  2011 PIH;IOL; PP CHF-HOSPITALIZED X 1 WEEK         Home Medications    Prior to Admission medications   Medication Sig Start Date End Date Taking? Authorizing Provider  baclofen (LIORESAL) 10 MG tablet Take 1 tablet (10 mg total) by mouth 3 (three) times daily. 05/29/19   Sharion Balloon, FNP  fluconazole (DIFLUCAN) 150 MG tablet Take 1 tablet (150 mg total) by mouth every three (3) days as needed. 05/29/19   Sharion Balloon, FNP  naproxen (  NAPROSYN) 500 MG tablet Take 1 tablet (500 mg total) by mouth 2 (two) times daily with a meal. 06/01/19   Hawks, Alyse Low A, FNP  sulfamethoxazole-trimethoprim (BACTRIM DS) 800-160 MG tablet Take 1 tablet by mouth 2 (two) times daily. 05/29/19   Sharion Balloon, FNP    Family History Family History  Problem Relation Age of Onset  . Learning disabilities Other   . Colon cancer Maternal Grandmother   . Cancer Maternal Grandmother        colon  . Asthma Brother   . Learning disabilities Brother   . Asthma Daughter   . Learning disabilities Daughter   . Seizures Daughter   . Chromosomal disorder Daughter        1q21.1 microdeletion  . Cataracts Daughter        congenital  . Anesthesia problems Neg Hx   . Other Neg Hx     Social History Social History   Tobacco Use  .  Smoking status: Never Smoker  . Smokeless tobacco: Never Used  Substance Use Topics  . Alcohol use: No    Alcohol/week: 0.0 standard drinks  . Drug use: Not Currently    Types: Marijuana     Allergies   Metoclopramide and Ondansetron   Review of Systems Review of Systems  Constitutional: Negative for chills and fever.  HENT: Negative for congestion.   Eyes: Negative for visual disturbance.  Respiratory: Negative for shortness of breath.   Cardiovascular: Negative for chest pain.  Gastrointestinal: Negative for abdominal pain and vomiting.  Genitourinary: Positive for flank pain. Negative for dysuria, vaginal bleeding, vaginal discharge and vaginal pain.  Musculoskeletal: Negative for back pain, neck pain and neck stiffness.  Skin: Negative for rash.  Neurological: Negative for light-headedness and headaches.     Physical Exam Updated Vital Signs BP 124/77   Pulse 72   Temp 98 F (36.7 C) (Oral)   Resp 16   Ht 5' (1.524 m)   Wt 59 kg   LMP 05/30/2019   SpO2 98%   BMI 25.39 kg/m   Physical Exam Vitals signs and nursing note reviewed.  Constitutional:      Appearance: She is well-developed.  HENT:     Head: Normocephalic and atraumatic.  Eyes:     General:        Right eye: No discharge.        Left eye: No discharge.     Conjunctiva/sclera: Conjunctivae normal.  Neck:     Musculoskeletal: Normal range of motion and neck supple.     Trachea: No tracheal deviation.  Cardiovascular:     Rate and Rhythm: Normal rate.  Pulmonary:     Effort: Pulmonary effort is normal.  Abdominal:     General: There is no distension.     Palpations: Abdomen is soft.     Tenderness: There is no abdominal tenderness. There is no guarding.  Musculoskeletal:        General: Tenderness present.     Comments: Tender paraspinal right flank, no midline tenderness  Skin:    General: Skin is warm.     Findings: No rash.  Neurological:     Mental Status: She is alert and oriented  to person, place, and time.     GCS: GCS eye subscore is 4. GCS verbal subscore is 5. GCS motor subscore is 6.     Motor: Motor function is intact.      ED Treatments / Results  Labs (all labs ordered  are listed, but only abnormal results are displayed) Labs Reviewed  URINALYSIS, ROUTINE W REFLEX MICROSCOPIC - Abnormal; Notable for the following components:      Result Value   Specific Gravity, Urine >1.030 (*)    All other components within normal limits  PREGNANCY, URINE    EKG None  Radiology Ct Renal Stone Study  Result Date: 06/16/2019 CLINICAL DATA:  Right flank pain for 8 hours. EXAM: CT ABDOMEN AND PELVIS WITHOUT CONTRAST TECHNIQUE: Multidetector CT imaging of the abdomen and pelvis was performed following the standard protocol without IV contrast. COMPARISON:  Contrast enhanced CT of 12/26/2018. FINDINGS: Lower chest: Clear lung bases. Normal heart size without pericardial or pleural effusion. Bilateral breast implants. Hepatobiliary: Normal liver. Cholecystectomy, without biliary ductal dilatation. Pancreas: Normal, without mass or ductal dilatation. Spleen: Normal in size, without focal abnormality. Adrenals/Urinary Tract: Normal adrenal glands. Suspect punctate bilateral renal collecting system calculi, most apparent on coronal reformats. No hydronephrosis. No hydroureter or ureteric calculi. No bladder calculi. Stomach/Bowel: Prior surgical changes about the stomach. Normal colon and terminal ileum. Suspect a normal appendix on image 65/2. Normal small bowel. Vascular/Lymphatic: Normal caliber of the aorta and branch vessels. No abdominopelvic adenopathy. Reproductive: Normal uterus. Adnexa not well evaluated on this noncontrast exam. Possible asymmetric soft tissue thickening about the right labia, including on image 94/2. Other: No significant free fluid. Musculoskeletal: Probable fat necrosis superficial the right gluteal musculature, including at 4.0 cm. IMPRESSION: 1.  Suspicion of punctate bilateral renal collecting system calculi. No obstructive uropathy. 2. No other explanation for right-sided pain. 3. Possible soft tissue thickening about the right labia. Consider physical exam correlation. Electronically Signed   By: Abigail Miyamoto M.D.   On: 06/16/2019 17:17    Procedures Procedures (including critical care time)  Medications Ordered in ED Medications  HYDROcodone-acetaminophen (NORCO/VICODIN) 5-325 MG per tablet 2 tablet (2 tablets Oral Given 06/16/19 1651)     Initial Impression / Assessment and Plan / ED Course  I have reviewed the triage vital signs and the nursing notes.  Pertinent labs & imaging results that were available during my care of the patient were reviewed by me and considered in my medical decision making (see chart for details).       Patient presents with acute right flank pain concern clinically for musculoskeletal versus kidney stone versus kidney infection versus other.  Urinalysis no sign of infection, pregnancy test negative.  CT scan no kidney stone or acute pathology.  Patient's symptoms resolved on reassessment.  Discussed reasons to return to follow-up with primary doctor. No pelvic or vaginal sxs.  Results and differential diagnosis were discussed with the patient/parent/guardian. Xrays were independently reviewed by myself.  Close follow up outpatient was discussed, comfortable with the plan.   Medications  HYDROcodone-acetaminophen (NORCO/VICODIN) 5-325 MG per tablet 2 tablet (2 tablets Oral Given 06/16/19 1651)    Vitals:   06/16/19 1447 06/16/19 1657 06/16/19 1827  BP: 120/87 118/64 124/77  Pulse: 89 88 72  Resp: _0 Temp: 98.3 F (36.8 C)  98 F (36.7 C)  TempSrc:   Oral  SpO2: 100% 100% 98%  Weight: 59 kg    Height: 5' (1.524 m)      Final diagnoses:  Acute right flank pain     Final Clinical Impressions(s) / ED Diagnoses   Final diagnoses:  Acute right flank pain    ED Discharge Orders     None       Elnora Morrison, MD 06/16/19  [place]

## 2019-06-18 ENCOUNTER — Encounter: Payer: Self-pay | Admitting: Family Medicine

## 2019-06-18 NOTE — Progress Notes (Signed)
Erroneous. Pt in E.D. already taken back and evaluation has started. WS

## 2019-07-10 ENCOUNTER — Encounter: Payer: Self-pay | Admitting: Family

## 2019-07-10 ENCOUNTER — Ambulatory Visit (INDEPENDENT_AMBULATORY_CARE_PROVIDER_SITE_OTHER): Payer: Medicaid Other | Admitting: Family

## 2019-07-10 DIAGNOSIS — B373 Candidiasis of vulva and vagina: Secondary | ICD-10-CM

## 2019-07-10 DIAGNOSIS — B3731 Acute candidiasis of vulva and vagina: Secondary | ICD-10-CM

## 2019-07-10 MED ORDER — FLUCONAZOLE 150 MG PO TABS: 150.0000 mg | ORAL_TABLET | ORAL | 0 refills | Status: DC | PRN

## 2019-07-10 NOTE — Progress Notes (Addendum)
   Virtual Visit via telephone Note  Attempted to call patient at 10:08 Am, no answer, VM left.   Attempted to call patient at 10:55 AM, no answer, VM left.  Attempted to call patient at 11:42 Am, no answer, pt will reschedule.    I connected with Brenda Spencer on 07/10/19 at 1:04 pm by telephone and verified that I am speaking with the correct person using two identifiers. Brenda Spencer is currently located in a car and mother and kids  is currently with her during visit. The provider, Evelina Dun, FNP is located in their office at time of visit.  I discussed the limitations, risks, security and privacy concerns of performing an evaluation and management service by telephone and the availability of in person appointments. I also discussed with the patient that there may be a patient responsible charge related to this service. The patient expressed understanding and agreed to proceed.   History and Present Illness:  Vaginal Discharge The patient's primary symptoms include genital itching, a genital odor and vaginal discharge. The patient's pertinent negatives include no vaginal bleeding. This is a new problem. The current episode started in the past 7 days. The problem occurs constantly. The problem has been unchanged. The pain is mild. Associated symptoms include frequency and urgency. Pertinent negatives include no back pain, discolored urine, dysuria, fever or hematuria. The vaginal discharge was white. She has tried antibiotics for the symptoms. The treatment provided mild relief.      Review of Systems  Constitutional: Negative for fever.  Genitourinary: Positive for frequency, urgency and vaginal discharge. Negative for dysuria and hematuria.  Musculoskeletal: Negative for back pain.     Observations/Objective: No SOB or discharge   Assessment and Plan: 1. Vagina, candidiasis - fluconazole (DIFLUCAN) 150 MG tablet; Take 1 tablet (150 mg total) by mouth every three  (3) days as needed.  Dispense: 3 tablet; Refill: 0  Start probiotic Force fluids If vaginal discharge does not improve needs to be seen for STI testing  RTO if symptoms worsen or do not improve    I discussed the assessment and treatment plan with the patient. The patient was provided an opportunity to ask questions and all were answered. The patient agreed with the plan and demonstrated an understanding of the instructions.   The patient was advised to call back or seek an in-person evaluation if the symptoms worsen or if the condition fails to improve as anticipated.  The above assessment and management plan was discussed with the patient. The patient verbalized understanding of and has agreed to the management plan. Patient is aware to call the clinic if symptoms persist or worsen. Patient is aware when to return to the clinic for a follow-up visit. Patient educated on when it is appropriate to go to the emergency department.   Time call ended:  1:15 pm  I provided 11 minutes of non-face-to-face time during this encounter.    Evelina Dun, FNP

## 2019-07-10 NOTE — Addendum Note (Signed)
Addended by: Evelina Dun A on: 07/10/2019 01:15 PM   Modules accepted: Orders, Level of Service

## 2019-09-08 ENCOUNTER — Encounter (HOSPITAL_COMMUNITY): Payer: Self-pay | Admitting: *Deleted

## 2019-09-08 ENCOUNTER — Emergency Department (HOSPITAL_COMMUNITY)
Admission: EM | Admit: 2019-09-08 | Discharge: 2019-09-08 | Disposition: A | Discharge status: Home / Self Care | Payer: Medicaid Other | Attending: Emergency Medicine | Admitting: Emergency Medicine

## 2019-09-08 DIAGNOSIS — M545 Low back pain, unspecified: Secondary | ICD-10-CM

## 2019-09-08 DIAGNOSIS — I509 Heart failure, unspecified: Secondary | ICD-10-CM | POA: Insufficient documentation

## 2019-09-08 DIAGNOSIS — H9201 Otalgia, right ear: Secondary | ICD-10-CM | POA: Diagnosis not present

## 2019-09-08 DIAGNOSIS — Z79899 Other long term (current) drug therapy: Secondary | ICD-10-CM | POA: Insufficient documentation

## 2019-09-08 DIAGNOSIS — J45909 Unspecified asthma, uncomplicated: Secondary | ICD-10-CM | POA: Diagnosis not present

## 2019-09-08 LAB — URINALYSIS, ROUTINE W REFLEX MICROSCOPIC
Bilirubin Urine: NEGATIVE
Glucose, UA: NEGATIVE mg/dL
Hgb urine dipstick: NEGATIVE
Ketones, ur: NEGATIVE mg/dL
Nitrite: NEGATIVE
Protein, ur: 30 mg/dL — AB
Specific Gravity, Urine: 1.03 (ref 1.005–1.030)
pH: 5 (ref 5.0–8.0)

## 2019-09-08 LAB — POC URINE PREG, ED: Preg Test, Ur: NEGATIVE

## 2019-09-08 MED ORDER — NAPROXEN 500 MG PO TABS: 500.0000 mg | ORAL_TABLET | Freq: Two times a day (BID) | ORAL | 0 refills | Status: DC

## 2019-09-08 MED ORDER — FLUTICASONE PROPIONATE 50 MCG/ACT NA SUSP: 2.0000 | Freq: Every day | NASAL | 2 refills | Status: DC

## 2019-09-08 MED ORDER — KETOROLAC TROMETHAMINE 30 MG/ML IJ SOLN
30.0000 mg | Freq: Once | INTRAMUSCULAR | Status: AC
  Administered 2019-09-08: 30 mg via INTRAMUSCULAR
  Filled 2019-09-08: qty 1

## 2019-09-08 NOTE — ED Triage Notes (Signed)
To ED for eval of right ear pain since this am. Also complains of lower back pain - no radiation. Denies difficulty with urination. No vomiting. Ambulatory without difficulty.

## 2019-09-08 NOTE — ED Provider Notes (Signed)
[facility] EMERGENCY DEPARTMENT Provider Note   CSN: [id number] Arrival date & time: 09/08/19  0458     History   Chief Complaint Chief Complaint  Patient presents with  . Back Pain  . Otalgia    HPI Yesly D Tschirhart is a 28 y.o. female.     HPI   This is a 28 year old female with a history of postpartum cardiomyopathy, chronic back pain who presents with earache and back pain.  Patient reports onset of right ear pain early this morning.  Denies any fever.  States that she feels like it is draining down her throat.  Denies any recent sore throat or congestion.  Additionally she reports right-sided back pain.  This been ongoing for 2 to 3 days.  It is nonradiating.  She denies weakness, numbness, tingling, bowel or bladder difficulty.  She denies any hematuria or dysuria.  She rates her pain at 8 out of 10.  She has taken ibuprofen and Tylenol with minimal relief.  Past Medical History:  Diagnosis Date  . Acute post-traumatic headache, not intractable 01/19/2016  . Asthma    ALbuterol inhaler as needed  . CHF (congestive heart failure) ([place]) 2011   RESOLVED; HAD CARDIAC EVAL   . Chronic back pain    reason unknown  . Congestive heart failure ([place]) 2011   related to preeclampsia  . Nocturia   . Noncompliance   . Postgastrectomy malabsorption     Patient Active Problem List   Diagnosis Date Noted  . Symptomatic anemia 12/15/2018  . Acute blood loss anemia 12/15/2018  . Chronic back pain 12/15/2018  . Chronic constipation 12/15/2018  . Hypokalemia 12/15/2018  . Suicidal ideations   . Severe recurrent major depression without psychotic features ([place]) 06/07/2018  . Low back pain 09/30/2016  . Obesity, Class II, BMI 35-39.9 07/20/2016  . Chiari I malformation ([place]) 06/09/2016  . Arnold-Chiari malformation, type I ([place]) 01/20/2016  . Exposure to second hand tobacco smoke 01/19/2016  . Chromosome 1 deletion syndrome   . Parent of child with  chromosome abnormality   . History of pregnancy induced hypertension 02/28/2015  . History of congestive heart failure 01/09/2013  . Mild persistent asthma 11/18/2012  . UTI (urinary tract infection) 11/18/2012    Past Surgical History:  Procedure Laterality Date  . BREAST ENHANCEMENT SURGERY    . CHOLECYSTECTOMY     teenager  . SLEEVE GASTROPLASTY  01/18/2017  . SUBOCCIPITAL CRANIECTOMY CERVICAL LAMINECTOMY N/A 06/09/2016   Procedure: Chiari Decompression;  Surgeon: Consuella Lose, MD;  Location: [facility];  Service: Neurosurgery;  Laterality: N/A;  posterior/occipital  . TEAR DUCT PROBING  as a child  . TONSILLECTOMY     as a teenager  . TUBAL LIGATION Bilateral 09/10/2015   Procedure: POST PARTUM TUBAL LIGATION;  Surgeon: Mora Bellman, MD;  Location: [facility];  Service: Gynecology;  Laterality: Bilateral;  . WISDOM TOOTH EXTRACTION     as a teenager     OB History    Gravida  5   Para  4   Term  3   Preterm  1   AB  1   Living  4     SAB  1   TAB      Ectopic      Multiple  0   Live Births  4        Obstetric Comments  2011 PIH;IOL; PP CHF-HOSPITALIZED X 1 WEEK  Home Medications    Prior to Admission medications   Medication Sig Start Date End Date Taking? Authorizing Provider  baclofen (LIORESAL) 10 MG tablet Take 1 tablet (10 mg total) by mouth 3 (three) times daily. 05/29/19   Sharion Balloon, FNP  fluconazole (DIFLUCAN) 150 MG tablet Take 1 tablet (150 mg total) by mouth every three (3) days as needed. 07/10/19   Sharion Balloon, FNP  fluticasone (FLONASE) 50 MCG/ACT nasal spray Place 2 sprays into both nostrils daily. 09/08/19   , Barbette Hair, MD  naproxen (NAPROSYN) 500 MG tablet Take 1 tablet (500 mg total) by mouth 2 (two) times daily. 09/08/19   , Barbette Hair, MD    Family History Family History  Problem Relation Age of Onset  . Learning disabilities Other   . Colon cancer Maternal Grandmother   . Cancer Maternal  Grandmother        colon  . Asthma Brother   . Learning disabilities Brother   . Asthma Daughter   . Learning disabilities Daughter   . Seizures Daughter   . Chromosomal disorder Daughter        1q21.1 microdeletion  . Cataracts Daughter        congenital  . Anesthesia problems Neg Hx   . Other Neg Hx     Social History Social History   Tobacco Use  . Smoking status: Never Smoker  . Smokeless tobacco: Never Used  Substance Use Topics  . Alcohol use: No    Alcohol/week: 0.0 standard drinks  . Drug use: Not Currently    Types: Marijuana     Allergies   Metoclopramide and Ondansetron   Review of Systems Review of Systems  Constitutional: Negative for fever.  HENT: Positive for ear pain. Negative for trouble swallowing and voice change.   Respiratory: Negative for shortness of breath.   Cardiovascular: Negative for chest pain.  Gastrointestinal: Negative for abdominal pain.  Genitourinary: Negative for dysuria and hematuria.  All other systems reviewed and are negative.    Physical Exam Updated Vital Signs BP 99/69 (BP Location: Right Arm)   Pulse 81   Temp 98.3 F (36.8 C) (Oral)   Resp (!) 117   Ht 1.524 m (5')   Wt 59 kg   SpO2 99%   BMI 25.39 kg/m   Physical Exam Vitals signs and nursing note reviewed.  Constitutional:      Appearance: She is well-developed.  HENT:     Head: Normocephalic and atraumatic.     Ears:     Comments: Bilateral TMs clear, no erythema, intact light reflex, no bulging or effusion noted    Nose: Nose normal.     Mouth/Throat:     Mouth: Mucous membranes are moist.  Eyes:     Pupils: Pupils are equal, round, and reactive to light.  Neck:     Musculoskeletal: Neck supple.  Cardiovascular:     Rate and Rhythm: Normal rate and regular rhythm.     Heart sounds: Normal heart sounds.  Pulmonary:     Effort: Pulmonary effort is normal. No respiratory distress.     Breath sounds: No wheezing.  Abdominal:     General: Bowel  sounds are normal.     Palpations: Abdomen is soft.     Tenderness: There is no abdominal tenderness.  Musculoskeletal: Normal range of motion.     Comments: Tenderness to palpation right paraspinous muscle region of the upper lumbar and lower thoracic spine, no step-off or deformity  noted  Skin:    General: Skin is warm and dry.  Neurological:     Mental Status: She is alert and oriented to person, place, and time.     Comments: 5/5 strength bilateral lower extremities  Psychiatric:        Mood and Affect: Mood normal.      ED Treatments / Results  Labs (all labs ordered are listed, but only abnormal results are displayed) Labs Reviewed  URINALYSIS, ROUTINE W REFLEX MICROSCOPIC - Abnormal; Notable for the following components:      Result Value   APPearance HAZY (*)    Protein, ur 30 (*)    Leukocytes,Ua MODERATE (*)    Bacteria, UA RARE (*)    All other components within normal limits  POC URINE PREG, ED    EKG None  Radiology No results found.  Procedures Procedures (including critical care time)  Medications Ordered in ED Medications  ketorolac (TORADOL) 30 MG/ML injection 30 mg (30 mg Intramuscular Given 09/08/19 0610)     Initial Impression / Assessment and Plan / ED Course  I have reviewed the triage vital signs and the nursing notes.  Pertinent labs & imaging results that were available during my care of the patient were reviewed by me and considered in my medical decision making (see chart for details).        Patient presents with 2 complaints.  She reports otalgia as well as back pain.  She is overall nontoxic and vital signs initially notable for tachycardia.  She is afebrile.  No evidence of UTI on urinalysis.  Back pain is reproducible without repeat red flags.  She has a history of the same.  Additionally her ear exam is normal.  She could have some drainage or allergy.  Recommend naproxen for any pain and Flonase to clear the middle ear.  Patient  is agreeable to plan.  After history, exam, and medical workup I feel the patient has been appropriately medically screened and is safe for discharge home. Pertinent diagnoses were discussed with the patient. Patient was given return precautions.   Final Clinical Impressions(s) / ED Diagnoses   Final diagnoses:  Right ear pain  Acute right-sided low back pain without sciatica    ED Discharge Orders         Ordered    naproxen (NAPROSYN) 500 MG tablet  2 times daily     09/08/19 0631    fluticasone (FLONASE) 50 MCG/ACT nasal spray  Daily     09/08/19 0631           Merryl Hacker, MD 09/08/19 [phone]

## 2019-09-08 NOTE — Discharge Instructions (Addendum)
You were seen today for ear pain and back pain.  Your exam is reassuring.  Your urine does not show any evidence of urinary tract infection.  Take naproxen as needed for pain.  You may also take Flonase to clear out the middle ear.

## 2019-09-26 ENCOUNTER — Emergency Department (HOSPITAL_COMMUNITY): Payer: Medicaid Other

## 2019-09-26 ENCOUNTER — Emergency Department (HOSPITAL_COMMUNITY)
Admission: EM | Admit: 2019-09-26 | Discharge: 2019-09-27 | Disposition: A | Discharge status: Home / Self Care | Payer: Medicaid Other | Attending: Emergency Medicine | Admitting: Emergency Medicine

## 2019-09-26 ENCOUNTER — Encounter (HOSPITAL_COMMUNITY): Payer: Self-pay | Admitting: Emergency Medicine

## 2019-09-26 ENCOUNTER — Other Ambulatory Visit: Payer: Self-pay

## 2019-09-26 DIAGNOSIS — M79671 Pain in right foot: Secondary | ICD-10-CM | POA: Diagnosis present

## 2019-09-26 DIAGNOSIS — M545 Low back pain, unspecified: Secondary | ICD-10-CM

## 2019-09-26 DIAGNOSIS — R42 Dizziness and giddiness: Secondary | ICD-10-CM | POA: Insufficient documentation

## 2019-09-26 DIAGNOSIS — G8929 Other chronic pain: Secondary | ICD-10-CM | POA: Diagnosis not present

## 2019-09-26 DIAGNOSIS — M722 Plantar fascial fibromatosis: Secondary | ICD-10-CM | POA: Diagnosis not present

## 2019-09-26 LAB — POC URINE PREG, ED: Preg Test, Ur: NEGATIVE

## 2019-09-26 LAB — URINALYSIS, ROUTINE W REFLEX MICROSCOPIC
Bacteria, UA: NONE SEEN
Bilirubin Urine: NEGATIVE
Glucose, UA: NEGATIVE mg/dL
Hgb urine dipstick: NEGATIVE
Ketones, ur: NEGATIVE mg/dL
Leukocytes,Ua: NEGATIVE
Nitrite: NEGATIVE
Protein, ur: 30 mg/dL — AB
Specific Gravity, Urine: 1.026 (ref 1.005–1.030)
pH: 6 (ref 5.0–8.0)

## 2019-09-26 MED ORDER — SODIUM CHLORIDE 0.9 % IV SOLN
1000.0000 mL | INTRAVENOUS | Status: DC
  Administered 2019-09-26: 1000 mL via INTRAVENOUS

## 2019-09-26 MED ORDER — PROCHLORPERAZINE EDISYLATE 10 MG/2ML IJ SOLN
5.0000 mg | Freq: Once | INTRAMUSCULAR | Status: AC
  Administered 2019-09-26: 5 mg via INTRAVENOUS
  Filled 2019-09-26: qty 2

## 2019-09-26 MED ORDER — SODIUM CHLORIDE 0.9 % IV BOLUS (SEPSIS)
1000.0000 mL | Freq: Once | INTRAVENOUS | Status: AC
  Administered 2019-09-26: 1000 mL via INTRAVENOUS

## 2019-09-26 MED ORDER — DEXAMETHASONE SODIUM PHOSPHATE 10 MG/ML IJ SOLN
10.0000 mg | Freq: Once | INTRAMUSCULAR | Status: AC
  Administered 2019-09-26: 22:00:00 10 mg via INTRAVENOUS
  Filled 2019-09-26: qty 1

## 2019-09-26 MED ORDER — HYDROCODONE-ACETAMINOPHEN 5-325 MG PO TABS
2.0000 | ORAL_TABLET | Freq: Once | ORAL | Status: AC
  Administered 2019-09-26: 2 via ORAL
  Filled 2019-09-26: qty 2

## 2019-09-26 NOTE — ED Triage Notes (Signed)
Patient complains of neck pain, lower back pain that radiates down to right leg. States dizziness that started an hour ago.

## 2019-09-26 NOTE — ED Provider Notes (Signed)
[facility] EMERGENCY DEPARTMENT Provider Note   CSN: [id number] Arrival date & time: 09/26/19  1514     History   Chief Complaint Chief Complaint  Patient presents with  . Back Pain  . Leg Pain    HPI Brenda Spencer is a 28 y.o. female.     Patient reports feeling as though she may be dehydrated.  She says that she has not been drinking liquids as she should.  She says that today around noon she developed some lightheaded sensation.  This was also accompanied by a very short episode of blurring vision.  She attempted to lay down to see if this would go away, but the lightheaded sensation came back, and was accompanied at times by headache.  The patient states she has not had any recent injury to her foot.  She has pain however when she flexes her toes, or extends her foot.  The pain is mostly in the arch and extends back toward the heel.  Lightheaded today 12noon. Driving. Blurring of vision. 2 min. Laid down but felt lightheaded again. Head pounding.  The history is provided by the patient.  Back Pain Location:  Lumbar spine Quality: sharpe. Radiates to:  R thigh and R foot Pain severity:  Moderate Duration:  1 week Timing:  Intermittent Progression:  Worsening Chronicity:  Chronic Context: not falling, not lifting heavy objects, not MVA, not recent illness and not twisting   Relieved by:  Nothing Worsened by:  Nothing Ineffective treatments:  None tried Associated symptoms: headaches and leg pain   Associated symptoms: no abdominal pain, no bladder incontinence, no bowel incontinence, no chest pain, no dysuria, no fever, no numbness and no weakness   Leg Pain Associated symptoms: back pain   Associated symptoms: no fever and no neck pain     Past Medical History:  Diagnosis Date  . Acute post-traumatic headache, not intractable 01/19/2016  . Asthma    ALbuterol inhaler as needed  . CHF (congestive heart failure) ([place]) 2011   RESOLVED; HAD CARDIAC EVAL   .  Chronic back pain    reason unknown  . Congestive heart failure ([place]) 2011   related to preeclampsia  . Nocturia   . Noncompliance   . Postgastrectomy malabsorption     Patient Active Problem List   Diagnosis Date Noted  . Symptomatic anemia 12/15/2018  . Acute blood loss anemia 12/15/2018  . Chronic back pain 12/15/2018  . Chronic constipation 12/15/2018  . Hypokalemia 12/15/2018  . Suicidal ideations   . Severe recurrent major depression without psychotic features ([place]) 06/07/2018  . Low back pain 09/30/2016  . Obesity, Class II, BMI 35-39.9 07/20/2016  . Chiari I malformation ([place]) 06/09/2016  . Arnold-Chiari malformation, type I ([place]) 01/20/2016  . Exposure to second hand tobacco smoke 01/19/2016  . Chromosome 1 deletion syndrome   . Parent of child with chromosome abnormality   . History of pregnancy induced hypertension 02/28/2015  . History of congestive heart failure 01/09/2013  . Mild persistent asthma 11/18/2012  . UTI (urinary tract infection) 11/18/2012    Past Surgical History:  Procedure Laterality Date  . BREAST ENHANCEMENT SURGERY    . CHOLECYSTECTOMY     teenager  . SLEEVE GASTROPLASTY  01/18/2017  . SUBOCCIPITAL CRANIECTOMY CERVICAL LAMINECTOMY N/A 06/09/2016   Procedure: Chiari Decompression;  Surgeon: Consuella Lose, MD;  Location: [facility];  Service: Neurosurgery;  Laterality: N/A;  posterior/occipital  . TEAR DUCT PROBING  as a child  .  TONSILLECTOMY     as a teenager  . TUBAL LIGATION Bilateral 09/10/2015   Procedure: POST PARTUM TUBAL LIGATION;  Surgeon: Mora Bellman, MD;  Location: [facility];  Service: Gynecology;  Laterality: Bilateral;  . WISDOM TOOTH EXTRACTION     as a teenager     OB History    Gravida  5   Para  4   Term  3   Preterm  1   AB  1   Living  4     SAB  1   TAB      Ectopic      Multiple  0   Live Births  4        Obstetric Comments  2011 PIH;IOL; PP CHF-HOSPITALIZED X 1 WEEK         Home  Medications    Prior to Admission medications   Medication Sig Start Date End Date Taking? Authorizing Provider  acetaminophen (TYLENOL) 500 MG tablet Take 500 mg by mouth every 6 (six) hours as needed for mild pain or moderate pain.   Yes [provider]    Family History Family History  Problem Relation Age of Onset  . Learning disabilities Other   . Colon cancer Maternal Grandmother   . Cancer Maternal Grandmother        colon  . Asthma Brother   . Learning disabilities Brother   . Asthma Daughter   . Learning disabilities Daughter   . Seizures Daughter   . Chromosomal disorder Daughter        1q21.1 microdeletion  . Cataracts Daughter        congenital  . Anesthesia problems Neg Hx   . Other Neg Hx     Social History Social History   Tobacco Use  . Smoking status: Never Smoker  . Smokeless tobacco: Never Used  Substance Use Topics  . Alcohol use: No    Alcohol/week: 0.0 standard drinks  . Drug use: Not Currently    Types: Marijuana     Allergies   Metoclopramide and Ondansetron   Review of Systems Review of Systems  Constitutional: Negative for activity change, appetite change and fever.  HENT: Negative for congestion, ear discharge, ear pain, facial swelling, nosebleeds, rhinorrhea, sneezing and tinnitus.   Eyes: Negative for photophobia, pain and discharge.  Respiratory: Negative for cough, choking, shortness of breath and wheezing.   Cardiovascular: Negative for chest pain, palpitations and leg swelling.  Gastrointestinal: Negative for abdominal pain, blood in stool, bowel incontinence, constipation, diarrhea, nausea and vomiting.  Genitourinary: Negative for bladder incontinence, difficulty urinating, dysuria, flank pain, frequency and hematuria.  Musculoskeletal: Positive for back pain. Negative for gait problem, myalgias and neck pain.  Skin: Negative for color change, rash and wound.  Neurological: Positive for light-headedness and  headaches. Negative for dizziness, seizures, syncope, facial asymmetry, speech difficulty, weakness and numbness.  Hematological: Negative for adenopathy. Does not bruise/bleed easily.  Psychiatric/Behavioral: Negative for agitation, confusion, hallucinations, self-injury and suicidal ideas. The patient is not nervous/anxious.      Physical Exam Updated Vital Signs BP 122/83 (BP Location: Right Arm)   Pulse 86   Temp 98.4 F (36.9 C) (Oral)   Resp 16   LMP 09/13/2019   SpO2 100%   Physical Exam Vitals signs and nursing note reviewed.  Constitutional:      Appearance: She is well-developed. She is not toxic-appearing.  HENT:     Head: Normocephalic.     Right Ear: Tympanic membrane and  external ear normal.     Left Ear: Tympanic membrane and external ear normal.  Eyes:     General: Lids are normal.     Pupils: Pupils are equal, round, and reactive to light.  Neck:     Musculoskeletal: Normal range of motion and neck supple.     Vascular: No carotid bruit.  Cardiovascular:     Rate and Rhythm: Normal rate and regular rhythm.     Pulses: Normal pulses.     Heart sounds: Normal heart sounds.  Pulmonary:     Effort: No respiratory distress.     Breath sounds: Normal breath sounds.  Abdominal:     General: Bowel sounds are normal.     Palpations: Abdomen is soft.     Tenderness: There is no abdominal tenderness. There is no guarding.  Musculoskeletal:     Lumbar back: She exhibits decreased range of motion and pain.       Back:     Right foot: Decreased range of motion. Tenderness present.  Lymphadenopathy:     Head:     Right side of head: No submandibular adenopathy.     Left side of head: No submandibular adenopathy.     Cervical: No cervical adenopathy.  Skin:    General: Skin is warm and dry.  Neurological:     General: No focal deficit present.     Mental Status: She is alert and oriented to person, place, and time.     Cranial Nerves: No cranial nerve deficit.      Sensory: No sensory deficit.     Motor: No weakness.     Coordination: Coordination normal.  Psychiatric:        Speech: Speech normal.      ED Treatments / Results  Labs (all labs ordered are listed, but only abnormal results are displayed) Labs Reviewed - No data to display  EKG None  Radiology No results found.  Procedures Procedures (including critical care time)  Medications Ordered in ED Medications - No data to display   Initial Impression / Assessment and Plan / ED Course  I have reviewed the triage vital signs and the nursing notes.  Pertinent labs & imaging results that were available during my care of the patient were reviewed by me and considered in my medical decision making (see chart for details).          Final Clinical Impressions(s) / ED Diagnoses MDM  Vital signs reviewed.  Pulse oximetry is 100% on room air.  Within normal limits by my interpretation.  The examination of the foot suggest plantar fasciitis.  X-ray of the foot is negative for new fracture or foreign body.  There appears to be an old fracture of the right fifth metatarsal. The patient has both pain and some spasm involving the lower back.  No evidence for cauda equina or other emergent changes.  X-ray of the lower back is negative for any acute changes.  The patient was treated in the emergency department with IV medication.  The patient states the pain is significantly improved.  I have asked the patient to freeze a bottle of water and to practice rolling with a frozen bottle with her foot.  The patient will be treated with meloxicam and Decadron.  The patient is referred to Dr. Junius Roads, as she has seen Dr. Junius Roads in the past for orthopedic management.  I have asked the patient to use a heating pad to her lower back.  To  rest her back.  She is given a prescription for Flexeril to help with spasm pain.  Already asked her to use meloxicam for pain and inflammation.  The patient has  seen Dr. Junius Roads concerning her back in the past, and I have asked her to see Dr. Junius Roads again for additional evaluation and management of her back pain.  The patient is to return to the emergency department if any changes in her condition, worsening of her symptoms, problems or concerns.   Final diagnoses:  Plantar fasciitis, right  Chronic right-sided low back pain without sciatica    ED Discharge Orders         Ordered    dexamethasone (DECADRON) 4 MG tablet  2 times daily with meals     09/27/19 0006    meloxicam (MOBIC) 15 MG tablet  Daily     09/27/19 0006    cyclobenzaprine (FLEXERIL) 10 MG tablet  3 times daily     09/27/19 0006           Lily Kocher, PA-C 09/27/19 0020    Nat Christen, MD 09/27/19 1549

## 2019-09-26 NOTE — ED Notes (Signed)
Patient transported to X-ray 

## 2019-09-27 MED ORDER — MELOXICAM 15 MG PO TABS: 15.0000 mg | ORAL_TABLET | Freq: Every day | ORAL | 0 refills | Status: DC

## 2019-09-27 MED ORDER — CYCLOBENZAPRINE HCL 10 MG PO TABS: 10.0000 mg | ORAL_TABLET | Freq: Three times a day (TID) | ORAL | 0 refills | Status: DC

## 2019-09-27 MED ORDER — DEXAMETHASONE 4 MG PO TABS: 4.0000 mg | ORAL_TABLET | Freq: Two times a day (BID) | ORAL | 0 refills | Status: DC

## 2019-09-27 NOTE — Discharge Instructions (Addendum)
Your vital signs within normal limits.  Your oxygen level is 100% on room air.  The x-ray of your right foot is negative for new fracture or dislocation.  There appears to have been an old fracture behind your little toe.  Your examination favors a condition called plantar fasciitis.  The fibrous tissue layer in your foot is inflamed and is aggravated when you walk on it, or flex or extend your toes, or flex or extend your foot.  Please use the meloxicam daily with food.  Please use the Decadron 2 times daily with food.  Freeze a bottle of water, and practice rolling the bottle of water under your foot back and forth will also help.  Please see Dr. Hilts-orthopedics for assistance with this problem.  There is pain to palpation and also with range of motion of your lower back.  There appears to be some spasm present.  The x-ray of your back is negative for fracture or dislocation or acute changes.  Please use the Flexeril 3 times daily as needed for spasm pain. This medication may cause drowsiness. Please do not drink, drive, or participate in activity that requires concentration while taking this medication.  Heating pad to your lower back will be helpful.  Please see Dr. Junius Roads concerning your recurrent episodes of back pain.

## 2019-09-28 ENCOUNTER — Other Ambulatory Visit: Payer: Self-pay

## 2019-09-28 ENCOUNTER — Ambulatory Visit (INDEPENDENT_AMBULATORY_CARE_PROVIDER_SITE_OTHER): Payer: Medicaid Other | Admitting: Family Medicine

## 2019-09-28 ENCOUNTER — Ambulatory Visit: Payer: Medicaid Other

## 2019-09-28 ENCOUNTER — Encounter: Payer: Self-pay | Admitting: Family Medicine

## 2019-09-28 DIAGNOSIS — G8929 Other chronic pain: Secondary | ICD-10-CM | POA: Diagnosis not present

## 2019-09-28 DIAGNOSIS — M545 Low back pain, unspecified: Secondary | ICD-10-CM

## 2019-09-28 MED ORDER — VITAMIN D-3 125 MCG (5000 UT) PO TABS: 1.0000 | ORAL_TABLET | Freq: Every day | ORAL | 3 refills | Status: DC

## 2019-09-28 NOTE — Progress Notes (Signed)
Office Visit Note   Patient: Brenda Spencer           Date of Birth: [birth year]           MRN: [id number] Visit Date: 09/28/2019 Requested by: [name], DO [place],  [place] [address] PCP: [name], DO  Subjective: Chief Complaint  Patient presents with  . Lower Back - Pain    Continues to have pain across the lower back, but more on the right side right now. Occasional shooting pain down the leg (lasts 2-3 minutes). Been taking muscle relaxer and applying heat.    HPI: She is here with persistent right-sided low back pain.  MRI was unrevealing.  Pain does not radiate down the leg.  She is taking meloxicam and Flexeril occasionally.               ROS: No bowel bladder dysfunction, fevers or chills.  All other systems were reviewed and are negative.  Objective: Vital Signs: LMP 09/13/2019   Physical Exam:  General:  Alert and oriented, in no acute distress. Pulm:  Breathing unlabored. Psy:  Normal mood, congruent affect. Skin: No rash. Low back: She has leg length discrepancy with left leg is slightly shorter than the right.  She has myofascial trigger points in the right-sided lumbar paraspinous muscles that seem to reproduce her pain.  No tenderness over the SI joint or sciatic notch, lower extremity strength and reflexes remain normal.  Imaging: None today.  Assessment & Plan: 1.  Probable myofascial right-sided low back pain, could be related to leg length discrepancy -Physical therapy referral for myofascial release techniques.  She will try putting a lift in her left shoe. -Will empirically treat with vitamin D3.     Procedures: No procedures performed  No notes on file     PMFS History: Patient Active Problem List   Diagnosis Date Noted  . Symptomatic anemia 12/15/2018  . Acute blood loss anemia 12/15/2018  . Chronic back pain 12/15/2018  . Chronic constipation 12/15/2018  . Hypokalemia 12/15/2018  . Suicidal  ideations   . Severe recurrent major depression without psychotic features ([place]) 06/07/2018  . Panniculus 02/24/2018  . Low back pain 09/30/2016  . Obesity, Class II, BMI 35-39.9 07/20/2016  . Chiari I malformation ([place]) 06/09/2016  . Arnold-Chiari malformation, type I ([place]) 01/20/2016  . Exposure to second hand tobacco smoke 01/19/2016  . Chromosome 1 deletion syndrome   . Parent of child with chromosome abnormality   . History of pregnancy induced hypertension 02/28/2015  . History of congestive heart failure 01/09/2013  . Mild persistent asthma 11/18/2012  . UTI (urinary tract infection) 11/18/2012   Past Medical History:  Diagnosis Date  . Acute post-traumatic headache, not intractable 01/19/2016  . Asthma    ALbuterol inhaler as needed  . CHF (congestive heart failure) ([place]) 2011   RESOLVED; HAD CARDIAC EVAL   . Chronic back pain    reason unknown  . Congestive heart failure ([place]) 2011   related to preeclampsia  . Nocturia   . Noncompliance   . Postgastrectomy malabsorption     Family History  Problem Relation Age of Onset  . Learning disabilities Other   . Colon cancer Maternal Grandmother   . Cancer Maternal Grandmother        colon  . Asthma Brother   . Learning disabilities Brother   . Asthma Daughter   . Learning disabilities Daughter   . Seizures Daughter   .  Chromosomal disorder Daughter        1q21.1 microdeletion  . Cataracts Daughter        congenital  . Anesthesia problems Neg Hx   . Other Neg Hx     Past Surgical History:  Procedure Laterality Date  . BREAST ENHANCEMENT SURGERY    . CHOLECYSTECTOMY     teenager  . SLEEVE GASTROPLASTY  01/18/2017  . SUBOCCIPITAL CRANIECTOMY CERVICAL LAMINECTOMY N/A 06/09/2016   Procedure: Chiari Decompression;  Surgeon: Consuella Lose, MD;  Location: [facility];  Service: Neurosurgery;  Laterality: N/A;  posterior/occipital  . TEAR DUCT PROBING  as a child  . TONSILLECTOMY     as a teenager  . TUBAL  LIGATION Bilateral 09/10/2015   Procedure: POST PARTUM TUBAL LIGATION;  Surgeon: Mora Bellman, MD;  Location: [facility];  Service: Gynecology;  Laterality: Bilateral;  . WISDOM TOOTH EXTRACTION     as a teenager   Social History   Occupational History  . Occupation: HOMEMAKER  Tobacco Use  . Smoking status: Never Smoker  . Smokeless tobacco: Never Used  Substance and Sexual Activity  . Alcohol use: No    Alcohol/week: 0.0 standard drinks  . Drug use: Not Currently    Types: Marijuana  . Sexual activity: Yes    Partners: Male    Birth control/protection: Surgical

## 2019-09-29 ENCOUNTER — Encounter: Payer: Self-pay | Admitting: Physical Therapy

## 2019-09-29 ENCOUNTER — Ambulatory Visit: Payer: Medicaid Other | Attending: Family Medicine | Admitting: Physical Therapy

## 2019-09-29 DIAGNOSIS — G8929 Other chronic pain: Secondary | ICD-10-CM | POA: Diagnosis present

## 2019-09-29 DIAGNOSIS — R293 Abnormal posture: Secondary | ICD-10-CM | POA: Diagnosis present

## 2019-09-29 DIAGNOSIS — M6281 Muscle weakness (generalized): Secondary | ICD-10-CM | POA: Diagnosis present

## 2019-09-29 DIAGNOSIS — M545 Low back pain: Secondary | ICD-10-CM | POA: Diagnosis not present

## 2019-09-29 NOTE — Therapy (Signed)
[facility] [address] [place], [place], [address] Phone: [phone]   Fax:  [phone]  Physical Therapy Evaluation  Patient Details  Name: Brenda Spencer MRN: [id number] Date of Birth: 08/10/1991 Referring Provider (PT): Eunice Blase, MD   Encounter Date: 09/29/2019  PT End of Session - 09/29/19 1010    Visit Number  1    Date for PT Re-Evaluation  11/24/19    Authorization Type  MEDICAID, sending authorization    PT Start Time  0930    PT Stop Time  1010    PT Time Calculation (min)  40 min    Activity Tolerance  No increased pain    Behavior During Therapy  [facility] for tasks assessed/performed       Past Medical History:  Diagnosis Date  . Acute post-traumatic headache, not intractable 01/19/2016  . Asthma    ALbuterol inhaler as needed  . CHF (congestive heart failure) ([place]) 2011   RESOLVED; HAD CARDIAC EVAL   . Chronic back pain    reason unknown  . Congestive heart failure ([place]) 2011   related to preeclampsia  . Nocturia   . Noncompliance   . Postgastrectomy malabsorption     Past Surgical History:  Procedure Laterality Date  . BREAST ENHANCEMENT SURGERY    . CHOLECYSTECTOMY     teenager  . SLEEVE GASTROPLASTY  01/18/2017  . SUBOCCIPITAL CRANIECTOMY CERVICAL LAMINECTOMY N/A 06/09/2016   Procedure: Chiari Decompression;  Surgeon: Consuella Lose, MD;  Location: [facility];  Service: Neurosurgery;  Laterality: N/A;  posterior/occipital  . TEAR DUCT PROBING  as a child  . TONSILLECTOMY     as a teenager  . TUBAL LIGATION Bilateral 09/10/2015   Procedure: POST PARTUM TUBAL LIGATION;  Surgeon: Mora Bellman, MD;  Location: [facility];  Service: Gynecology;  Laterality: Bilateral;  . WISDOM TOOTH EXTRACTION     as a teenager    There were no vitals filed for this visit.   Subjective Assessment - 09/29/19 0931    Subjective  Pt referred to PT with Rt sided > Lt LBP x 1 year which is  worsening.  Occassional sharp pain down anterior thigh and leg into dorsal foot, no pain in toes.    Limitations  Sitting;Standing    How long can you sit comfortably?  30 min    How long can you stand comfortably?  30 min    How long can you walk comfortably?  unlimited    Diagnostic tests  MRI - unremarkable, MD diagnosed LLD with Rt leg long    Patient Stated Goals  pain relief    Currently in Pain?  Yes    Pain Score  10-Worst pain ever    Pain Location  Back    Pain Orientation  Lower;Right    Pain Descriptors / Indicators  Sharp    Pain Type  Chronic pain    Pain Onset  More than a month ago    Pain Frequency  Constant    Aggravating Factors   already a 10/10 for everything    Pain Relieving Factors  massage, pressure through low back    Effect of Pain on Daily Activities  hard to face each day with pain, has kids with disabilities (ages 86, 24, 33, 37)         [facility] PT Assessment - 09/29/19 0001      Assessment   Medical Diagnosis  M54.5,G89.29 (ICD-10-CM) - Chronic right-sided low  back pain without sciatica    Referring Provider (PT)  Hilts, Legrand Como, MD    Onset Date/Surgical Date  --   approx 1 year ago   Hand Dominance  Right    Next MD Visit  as needed    Prior Therapy  no      Precautions   Precautions  None      Restrictions   Weight Bearing Restrictions  No      Balance Screen   Has the patient fallen in the past 6 months  No      [place] residence    Living Arrangements  Parent;Children;Spouse/significant other      Prior Function   Level of Independence  Independent    Vocation  Other (comment)    Vocation Requirements  has 4 kids under 10    Leisure  shopping, walk      Cognition   Overall Cognitive Status  Within Functional Limits for tasks assessed      Observation/Other Assessments   Observations  slight genu valgum and in-toeing, pelvic obliquity with Rt hemi-pelvis higher (has leg length discrepancy)       ROM / Strength   AROM / PROM / Strength  AROM;Strength      AROM   Overall AROM Comments  lumbar ROM full with Rt LBP in Lt SB, fingers to ankles lumbar flexion, bil hip ROM WNL      Strength   Strength Assessment Site  Hip    Right/Left Hip  Right;Left    Right Hip Flexion  4+/5    Right Hip Extension  3+/5    Right Hip External Rotation   3+/5    Right Hip Internal Rotation  4/5    Right Hip ABduction  3+/5    Right Hip ADduction  4-/5    Left Hip Flexion  4+/5    Left Hip Extension  4-/5    Left Hip External Rotation  4-/5    Left Hip Internal Rotation  4/5    Left Hip ABduction  4-/5    Left Hip ADduction  4/5      Flexibility   Soft Tissue Assessment /Muscle Length  --   WNL bil LEs     Palpation   SI assessment   Rt SI joint tender, Active SLR improves Rt with posterior pelvic compression    Palpation comment  atrophy present superior gluteals and deep multifiuds on Rt over Rt SI joint, L4-S1, tender Rt gluteals, piriformis, SI joint line, lumbar paraspinals, facets L4-S1      Special Tests    Special Tests  Lumbar    Other special tests  poor/fair skin rolling lumbar L4 region    Lumbar Tests  other      other   Comments  relief with bil LE traction, long axis through ankles      High Level Balance   High Level Balance Comments  able to balance bil LEs, +Trendelenburg on Rt                Objective measurements completed on examination: See above findings.                   PT Long Term Goals - 09/29/19 1158      PT LONG TERM GOAL #1   Title  Pt will be ind in advanced HEP and understand how to safely progress    Baseline  no knowledge  Time  8    Period  Weeks    Status  New    Target Date  11/24/19      PT LONG TERM GOAL #2   Title  Pt will achieve at least 4+/5 in Rt hip ER, abduction and extension to provide more strength and stability in hip to care for her young children and household.    Baseline  Rt hip abduction,  ER , extension all 3+/5    Time  8    Period  Weeks    Status  New    Target Date  11/24/19      PT LONG TERM GOAL #3   Title  Pt will report pain reduced to </= 5/10 with daily tasks at least 4 days a week.    Baseline  10/10 daily pain    Time  8    Period  Weeks    Status  New    Target Date  11/24/19      PT LONG TERM GOAL #4   Title  Pt will demo proper recruitment of deep lumbar and pelvic stabilizers during functional squat, SLS and sit to stand to reduce risk of pain and injury while caring for kids and household    Baseline  unable to recruit deep core muscles, + Trendelenburg on Rt hip in SLS    Time  8    Period  Weeks    Status  New    Target Date  11/24/19      PT LONG TERM GOAL #5   Title  Pt will be able to perform sitting and standing activities for at least 45 min before pain worsens.    Baseline  can only sit and stand for 30 min each position    Time  8    Period  Weeks    Status  New    Target Date  11/24/19             Plan - 09/29/19 1011    Clinical Impression Statement  Pt is a pleasant 28yo female mother of 4 kids under 63 with chronic worsening LBP Rt>Lt x 1 year.  She has a LLD with Rt leg long.  MRI is unremarkable.  She has full ROM of spine and LEs but with pain at end range of Lt SB, feeling painful stretch on Rt side.  She has diffuse tendernss throughout Rt gluteals, piriformis, SI joint line and Rt lumbar soft tissues and facets L4-S1.  Atrophy is present in Rt superior glutes and deep multifidus of lower lumbar region on Rt.  ASLR is positive with greater ease lifting Rt LE with posterior pelvic compression, suggesting need for stabilization in lower lumbar and pelvic region.  Strength ranges from 3+/5-4+/5 in bil hips with most notable weakness in Rt hip ER, ext, abd.  Relief noted with supine long axis traction through bil LEs.  Pt will benefit from skilled PT for manual therapy, stabilization, strength, postural re-ed and self-care  strategies for pain to improve quality of life and reduce pain levels throughout daily demands.    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   Examination-Activity Limitations  Caring for Others;Stand;Sit    Examination-Participation Restrictions  Cleaning;Meal Prep;Driving;Laundry    Stability/Clinical Decision Making  Stable/Uncomplicated    Clinical Decision Making  Low    Rehab Potential  Good    PT Frequency  2x / week    PT Duration  8 weeks    PT Treatment/Interventions  ADLs/Self Care Home Management;Cryotherapy;Electrical Stimulation;Iontophoresis 4mg /ml Dexamethasone;Moist Heat;Traction;Functional mobility training;Therapeutic exercise;Neuromuscular re-education;Patient/family education;Manual techniques;Passive range of motion;Dry needling;Taping;Splinting;Spinal Manipulations;Joint Manipulations    PT Next Visit Plan  manual traction lumbar, myofascial release Rt lumbar/skin rolling, intro core, consider taping Rt SI joint or trial SI belt, glute med in closed chain SLS, LE strength hips ER, ext, abd    PT Home Exercise Plan  next visit    Consulted and Agree with Plan of Care  Patient       Patient will benefit from skilled therapeutic intervention in order to improve the following deficits and impairments:  Increased fascial restricitons, Decreased activity tolerance, Hypermobility, Pain, Hypomobility, Improper body mechanics, Decreased strength, Postural dysfunction  Visit Diagnosis: Chronic right-sided low back pain without sciatica - Plan: PT plan of care cert/re-cert  Muscle weakness (generalized) - Plan: PT plan of care cert/re-cert  Abnormal posture - Plan: PT plan of care cert/re-cert     Problem List Patient Active Problem List   Diagnosis Date Noted  . Symptomatic anemia 12/15/2018  . Acute blood loss anemia 12/15/2018  . Chronic back pain 12/15/2018  .  Chronic constipation 12/15/2018  . Hypokalemia 12/15/2018  . Suicidal ideations   . Severe recurrent major depression without psychotic features ([place]) 06/07/2018  . Panniculus 02/24/2018  . Low back pain 09/30/2016  . Obesity, Class II, BMI 35-39.9 07/20/2016  . Chiari I malformation ([place]) 06/09/2016  . Arnold-Chiari malformation, type I ([place]) 01/20/2016  . Exposure to second hand tobacco smoke 01/19/2016  . Chromosome 1 deletion syndrome   . Parent of child with chromosome abnormality   . History of pregnancy induced hypertension 02/28/2015  . History of congestive heart failure 01/09/2013  . Mild persistent asthma 11/18/2012  . UTI (urinary tract infection) 11/18/2012    Baruch Merl, PT 09/29/19 12:08 PM   [facility] [address] [place], [place], [address] Phone: [phone]   Fax:  [phone]  Name: Brenda Spencer MRN: [id number] Date of Birth: 08/24/1991

## 2019-10-09 ENCOUNTER — Other Ambulatory Visit: Payer: Self-pay

## 2019-10-09 ENCOUNTER — Ambulatory Visit: Payer: Medicaid Other | Admitting: Physical Therapy

## 2019-10-09 ENCOUNTER — Encounter: Payer: Self-pay | Admitting: Physical Therapy

## 2019-10-09 DIAGNOSIS — R293 Abnormal posture: Secondary | ICD-10-CM

## 2019-10-09 DIAGNOSIS — G8929 Other chronic pain: Secondary | ICD-10-CM

## 2019-10-09 DIAGNOSIS — M545 Low back pain, unspecified: Secondary | ICD-10-CM

## 2019-10-09 DIAGNOSIS — M6281 Muscle weakness (generalized): Secondary | ICD-10-CM

## 2019-10-09 NOTE — Therapy (Addendum)
[facility] [address] [place], [place], [address] Phone: [phone]   Fax:  [phone]  Physical Therapy Treatment/Discharge Summary   Patient Details  Name: Brenda Spencer MRN: [id number] Date of Birth: [birth year] Referring Provider (PT): Eunice Blase, MD   Encounter Date: 10/09/2019  PT End of Session - 10/09/19 1311    Visit Number  2    Number of Visits  4    Date for PT Re-Evaluation  11/24/19    Authorization Type  MEDICAID, Eval plus 3 visits approved 10/21-11/10    Authorization - Number of Visits  4    PT Start Time  [phone]   not scheduled for appt, agreed to short session   PT Stop Time  1020    PT Time Calculation (min)  33 min    Activity Tolerance  Patient tolerated treatment well;Patient limited by pain       Past Medical History:  Diagnosis Date  . Acute post-traumatic headache, not intractable 01/19/2016  . Asthma    ALbuterol inhaler as needed  . CHF (congestive heart failure) ([place]) 2011   RESOLVED; HAD CARDIAC EVAL   . Chronic back pain    reason unknown  . Congestive heart failure ([place]) 2011   related to preeclampsia  . Nocturia   . Noncompliance   . Postgastrectomy malabsorption     Past Surgical History:  Procedure Laterality Date  . BREAST ENHANCEMENT SURGERY    . CHOLECYSTECTOMY     teenager  . SLEEVE GASTROPLASTY  01/18/2017  . SUBOCCIPITAL CRANIECTOMY CERVICAL LAMINECTOMY N/A 06/09/2016   Procedure: Chiari Decompression;  Surgeon: Consuella Lose, MD;  Location: [facility];  Service: Neurosurgery;  Laterality: N/A;  posterior/occipital  . TEAR DUCT PROBING  as a child  . TONSILLECTOMY     as a teenager  . TUBAL LIGATION Bilateral 09/10/2015   Procedure: POST PARTUM TUBAL LIGATION;  Surgeon: Mora Bellman, MD;  Location: [facility];  Service: Gynecology;  Laterality: Bilateral;  . WISDOM TOOTH EXTRACTION     as a teenager    There were no vitals filed for this  visit.  Subjective Assessment - 10/09/19 1304    Subjective  The patient arrives without a scheduled appt, thought she has one today.   She is agreeable to a shortnened session b/c she is unable to reschedule due to childcare issues.  She reports 10 out of 10 pain.  " I feel like it needs to be popped."  I have my kids stand on my back b/c it feels better.    Currently in Pain?  Yes    Pain Score  10-Worst pain ever    Pain Location  Back    Pain Orientation  Right;Mid;Lower    Pain Type  Chronic pain                       [facility] Adult PT Treatment/Exercise - 10/09/19 0001      Lumbar Exercises: Standing   Other Standing Lumbar Exercises  abdominal draw in 5x 5 sec hold       Lumbar Exercises: Supine   Ab Set  5 reps      Moist Heat Therapy   Number Minutes Moist Heat  15 Minutes   concurrent with manual and ex   Moist Heat Location  Lumbar Spine      Electrical Stimulation   Electrical Stimulation Location  lumbar    Electrical Stimulation Action  IFC    Electrical Stimulation Parameters  18 ma 15 min concurrent with manual and ex    Electrical Stimulation Goals  Pain      Manual Therapy   Joint Mobilization  lumbar neutral gapping grade 4 bil 5x 20sec;  prone PA grade 4 30 sec L1-L5 x2;  manual traction and long leg distraction 1 minute each     Soft tissue mobilization  bil lumbar paraspinals     Kinesiotex  Facilitate Muscle      Kinesiotix   Facilitate Muscle   star pattern lumbar              PT Education - 10/09/19 1310    Education Details  verbal instructions on transverse abdominus activation in supine and standing;  no pictures secondary to time contraints    Person(s) Educated  Patient    Methods  Explanation;Demonstration    Comprehension  Verbalized understanding;Returned demonstration          PT Long Term Goals - 09/29/19 1158      PT LONG TERM GOAL #1   Title  Pt will be ind in advanced HEP and understand how to safely  progress    Baseline  no knowledge    Time  8    Period  Weeks    Status  New    Target Date  11/24/19      PT LONG TERM GOAL #2   Title  Pt will achieve at least 4+/5 in Rt hip ER, abduction and extension to provide more strength and stability in hip to care for her young children and household.    Baseline  Rt hip abduction, ER , extension all 3+/5    Time  8    Period  Weeks    Status  New    Target Date  11/24/19      PT LONG TERM GOAL #3   Title  Pt will report pain reduced to </= 5/10 with daily tasks at least 4 days a week.    Baseline  10/10 daily pain    Time  8    Period  Weeks    Status  New    Target Date  11/24/19      PT LONG TERM GOAL #4   Title  Pt will demo proper recruitment of deep lumbar and pelvic stabilizers during functional squat, SLS and sit to stand to reduce risk of pain and injury while caring for kids and household    Baseline  unable to recruit deep core muscles, + Trendelenburg on Rt hip in SLS    Time  8    Period  Weeks    Status  New    Target Date  11/24/19      PT LONG TERM GOAL #5   Title  Pt will be able to perform sitting and standing activities for at least 45 min before pain worsens.    Baseline  can only sit and stand for 30 min each position    Time  8    Period  Weeks    Status  New    Target Date  11/24/19            Plan - 10/09/19 1313    Clinical Impression Statement  The patient arrives with reports of 10/10 pain and ES/heat applied in order to encourage movement concurrently.  She reports relief with spinal joint mobilization with moderate pressure with both flexion/rotation and prone PA pressures.  Encouraged the initiation of low level stability/strengthening ex's for best long term outcomes.  Following treatment session her pain level is rated a 7 1/2.    Examination-Activity Limitations  Caring for Others;Stand;Sit    Examination-Participation Restrictions  Cleaning;Meal Prep;Driving;Laundry    Stability/Clinical  Decision Making  Stable/Uncomplicated    Rehab Potential  Good    PT Frequency  2x / week    PT Duration  8 weeks    PT Treatment/Interventions  ADLs/Self Care Home Management;Cryotherapy;Electrical Stimulation;Iontophoresis '4mg'$ /ml Dexamethasone;Moist Heat;Traction;Functional mobility training;Therapeutic exercise;Neuromuscular re-education;Patient/family education;Manual techniques;Passive range of motion;Dry needling;Taping;Splinting;Spinal Manipulations;Joint Manipulations    PT Next Visit Plan  manual traction lumbar, lumbar mobilization or manipulation;   myofascial release Rt lumbar/skin rolling, intro core, consider taping Rt SI joint or trial SI belt, glute med in closed chain SLS, LE strength hips ER, ext, abd    PT Home Exercise Plan  next visit       Patient will benefit from skilled therapeutic intervention in order to improve the following deficits and impairments:  Increased fascial restricitons, Decreased activity tolerance, Hypermobility, Pain, Hypomobility, Improper body mechanics, Decreased strength, Postural dysfunction  Visit Diagnosis: Chronic right-sided low back pain without sciatica  Muscle weakness (generalized)  Abnormal posture  PHYSICAL THERAPY DISCHARGE SUMMARY  Visits from Start of Care: 2  Current functional level related to goals / functional outcomes: The patient no-showed for her last scheduled appt on 11/11 and we have been unable to reach her due to calling restrictions on her phone.  Her Medicaid authorization period has expired.  Will discharge from PT at this time.     Remaining deficits: As above   Education / Equipment: Basic self care  Plan:                                                    Patient goals were not met. Patient is being discharged due to not returning since the last visit.  ?????         Problem List Patient Active Problem List   Diagnosis Date Noted  . Symptomatic anemia 12/15/2018  . Acute blood loss anemia  12/15/2018  . Chronic back pain 12/15/2018  . Chronic constipation 12/15/2018  . Hypokalemia 12/15/2018  . Suicidal ideations   . Severe recurrent major depression without psychotic features ([place]) 06/07/2018  . Panniculus 02/24/2018  . Low back pain 09/30/2016  . Obesity, Class II, BMI 35-39.9 07/20/2016  . Chiari I malformation ([place]) 06/09/2016  . Arnold-Chiari malformation, type I ([place]) 01/20/2016  . Exposure to second hand tobacco smoke 01/19/2016  . Chromosome 1 deletion syndrome   . Parent of child with chromosome abnormality   . History of pregnancy induced hypertension 02/28/2015  . History of congestive heart failure 01/09/2013  . Mild persistent asthma 11/18/2012  . UTI (urinary tract infection) 11/18/2012   Ruben Im, PT 10/09/19 1:19 PM Phone: [phone] Fax: [phone] Alvera Singh 10/09/2019, 1:19 PM  [facility] [address] [place], [place], [address] Phone: [phone]   Fax:  [phone]  Name: Brenda Spencer MRN: [id number] Date of Birth: 24-Nov-1991

## 2019-10-11 ENCOUNTER — Ambulatory Visit: Payer: Medicaid Other | Admitting: Physical Therapy

## 2019-10-16 ENCOUNTER — Other Ambulatory Visit: Payer: Self-pay

## 2019-10-16 ENCOUNTER — Ambulatory Visit (INDEPENDENT_AMBULATORY_CARE_PROVIDER_SITE_OTHER): Payer: Medicaid Other | Admitting: Family Medicine

## 2019-10-16 DIAGNOSIS — G8929 Other chronic pain: Secondary | ICD-10-CM | POA: Diagnosis not present

## 2019-10-16 DIAGNOSIS — M545 Low back pain, unspecified: Secondary | ICD-10-CM

## 2019-10-16 DIAGNOSIS — N898 Other specified noninflammatory disorders of vagina: Secondary | ICD-10-CM | POA: Diagnosis not present

## 2019-10-16 MED ORDER — FLUCONAZOLE 150 MG PO TABS: 150.0000 mg | ORAL_TABLET | Freq: Once | ORAL | 0 refills | Status: AC

## 2019-10-16 MED ORDER — MELOXICAM 15 MG PO TABS: 15.0000 mg | ORAL_TABLET | Freq: Every day | ORAL | 0 refills | Status: DC | PRN

## 2019-10-16 NOTE — Progress Notes (Signed)
Telephone visit  Subjective: CC: f/u ED visit PCP: [name], DO [id number] D Brenda Spencer is a 28 y.o. female calls for telephone consult today. Patient provides verbal consent for consult held via phone.  Location of patient: car Location of provider: Working remotely from home Others present for call: kids  1. Low back pain She reports intermittent episodes of pain that occur weekly, lasting about 3 days about once weekly.  She notes that symptoms are exacerbated by PT.  She has not been taking the meloxicam because she did not have the funds to do this.  She is not currently using any heel lift in her shoes because she "wears flip-flops all the time".  She recently talked to her case manager and they recommended eval by orthopedics.  She has indeed seen the orthopedist in October who reviewed MRI which was unremarkable.  The low back pain was thought to be secondary to leg length discrepancy.  She has not yet scheduled a follow-up visit with him.  2.  Vaginal discharge Patient reports a milky white vaginal discharge that is thick.  This appears to be her typical yeast vaginitis.  Denies any overt itching or foul odors.  No OTC treatments tried.  ROS: Per HPI  Allergies  Allergen Reactions  . Metoclopramide Other (See Comments) and Swelling    Tardive dyskinesia  . Ondansetron Nausea And Vomiting   Past Medical History:  Diagnosis Date  . Acute post-traumatic headache, not intractable 01/19/2016  . Asthma    ALbuterol inhaler as needed  . CHF (congestive heart failure) ([place]) 2011   RESOLVED; HAD CARDIAC EVAL   . Chronic back pain    reason unknown  . Congestive heart failure ([place]) 2011   related to preeclampsia  . Nocturia   . Noncompliance   . Postgastrectomy malabsorption     Current Outpatient Medications:  .  acetaminophen (TYLENOL) 500 MG tablet, Take 500 mg by mouth every 6 (six) hours as needed for mild pain or moderate pain., Disp: , Rfl:  .  Cholecalciferol  (VITAMIN D-3) 125 MCG (5000 UT) TABS, Take 1 tablet by mouth daily., Disp: 90 tablet, Rfl: 3 .  cyclobenzaprine (FLEXERIL) 10 MG tablet, Take 1 tablet (10 mg total) by mouth 3 (three) times daily., Disp: 20 tablet, Rfl: 0 .  dexamethasone (DECADRON) 4 MG tablet, Take 1 tablet (4 mg total) by mouth 2 (two) times daily with a meal., Disp: 10 tablet, Rfl: 0 .  meloxicam (MOBIC) 15 MG tablet, Take 1 tablet (15 mg total) by mouth daily., Disp: 6 tablet, Rfl: 0  Assessment/ Plan: 28 y.o. female   1. Chronic right-sided low back pain without sciatica Has not started the NSAID.  I have renewed the meloxicam and instructed her to take 1 hour prior to physical therapy.  She may take it daily if needed for ongoing back pain and flares.  She has Flexeril on hand.  I have encouraged her to follow-up with Dr. Junius Roads as scheduled if she is unable to tolerate physical therapy or feels that the pain is progressing.   - meloxicam (MOBIC) 15 MG tablet; Take 1 tablet (15 mg total) by mouth daily as needed for pain (take with food).  Dispense: 30 tablet; Refill: 0  2. Vaginal discharge We will empirically treat for presumed candidal vaginitis.  She will follow-up as needed - fluconazole (DIFLUCAN) 150 MG tablet; Take 1 tablet (150 mg total) by mouth once for 1 dose.  Dispense: 1 tablet; Refill: 0  Start time: 9:59am End time: 10:05am  Total time spent on patient care (including telephone call/ virtual visit): 10 minutes  [place], [place] [phone]

## 2019-10-18 ENCOUNTER — Encounter: Payer: Self-pay | Admitting: Nurse Practitioner

## 2019-10-18 ENCOUNTER — Ambulatory Visit: Payer: Medicaid Other | Admitting: Physical Therapy

## 2019-10-18 ENCOUNTER — Ambulatory Visit (INDEPENDENT_AMBULATORY_CARE_PROVIDER_SITE_OTHER): Payer: Medicaid Other | Admitting: Nurse Practitioner

## 2019-10-18 DIAGNOSIS — B373 Candidiasis of vulva and vagina: Secondary | ICD-10-CM

## 2019-10-18 DIAGNOSIS — B3731 Acute candidiasis of vulva and vagina: Secondary | ICD-10-CM

## 2019-10-18 MED ORDER — FLUCONAZOLE 150 MG PO TABS: ORAL_TABLET | ORAL | 0 refills | Status: DC

## 2019-10-18 NOTE — Progress Notes (Signed)
   Virtual Visit via telephone Note Due to COVID-19 pandemic this visit was conducted virtually. This visit type was conducted due to national recommendations for restrictions regarding the COVID-19 Pandemic (e.g. social distancing, sheltering in place) in an effort to limit this patient's exposure and mitigate transmission in our community. All issues noted in this document were discussed and addressed.  A physical exam was not performed with this format.  I connected with Brenda Spencer on 10/18/19 at 8:20 by telephone and verified that I am speaking with the correct person using two identifiers. Brenda [facility] is currently located at [facility] and no one is currently with her during visit. The provider, Mary-Margaret Hassell Done, FNP is located in their office at time of visit.  I discussed the limitations, risks, security and privacy concerns of performing an evaluation and management service by telephone and the availability of in person appointments. I also discussed with the patient that there may be a patient responsible charge related to this service. The patient expressed understanding and agreed to proceed.   History and Present Illness:   Chief Complaint: Vaginitis   HPI Patient calls in today c/o vaginal itching and vaginal discharge. She took a diflucan 4 days ago and she is still symptomatic. discharge does not have an odor.   Review of Systems  Constitutional: Negative for diaphoresis and weight loss.  Eyes: Negative for blurred vision, double vision and pain.  Respiratory: Negative for shortness of breath.   Cardiovascular: Negative for chest pain, palpitations, orthopnea and leg swelling.  Gastrointestinal: Negative for abdominal pain.  Genitourinary: Negative for dysuria, frequency and urgency.  Skin: Negative for rash.  Neurological: Negative for dizziness, sensory change, loss of consciousness, weakness and headaches.  Endo/Heme/Allergies: Negative for polydipsia. Does  not bruise/bleed easily.  Psychiatric/Behavioral: Negative for memory loss. The patient does not have insomnia.   All other systems reviewed and are negative.    Observations/Objective: Alert and oriented- answers all questions appropriately No distress    Assessment and Plan: Brenda Spencer in today with chief complaint of Vaginitis   1. Vaginal candidiasis Avoid bubble baths Void after intercourse Meds ordered this encounter  Medications  . fluconazole (DIFLUCAN) 150 MG tablet    Sig: 1 po q week x 4 weeks    Dispense:  4 tablet    Refill:  0    Order Specific Question:   Supervising Provider    Answer:   Caryl Pina A [id number]      Follow Up Instructions: prn    I discussed the assessment and treatment plan with the patient. The patient was provided an opportunity to ask questions and all were answered. The patient agreed with the plan and demonstrated an understanding of the instructions.   The patient was advised to call back or seek an in-person evaluation if the symptoms worsen or if the condition fails to improve as anticipated.  The above assessment and management plan was discussed with the patient. The patient verbalized understanding of and has agreed to the management plan. Patient is aware to call the clinic if symptoms persist or worsen. Patient is aware when to return to the clinic for a follow-up visit. Patient educated on when it is appropriate to go to the emergency department.   Time call ended:  8:30  I provided 10 minutes of non-face-to-face time during this encounter.    Mary-Margaret Hassell Done, FNP

## 2019-10-24 ENCOUNTER — Ambulatory Visit (INDEPENDENT_AMBULATORY_CARE_PROVIDER_SITE_OTHER): Payer: Medicaid Other | Admitting: Family Medicine

## 2019-10-24 ENCOUNTER — Encounter: Payer: Self-pay | Admitting: Family Medicine

## 2019-10-24 DIAGNOSIS — G8929 Other chronic pain: Secondary | ICD-10-CM

## 2019-10-24 DIAGNOSIS — M545 Low back pain: Secondary | ICD-10-CM

## 2019-10-24 MED ORDER — TRAMADOL HCL 50 MG PO TABS: 50.0000 mg | ORAL_TABLET | Freq: Four times a day (QID) | ORAL | 0 refills | Status: AC | PRN

## 2019-10-24 NOTE — Progress Notes (Signed)
Virtual Visit via Telephone Note  I connected with Brenda Spencer on 10/24/19 at 1:52 PM by telephone and verified that I am speaking with the correct person using two identifiers. Brenda Spencer is currently located at home and nobody is currently with her during this visit. The provider, Loman Brooklyn, FNP is located in their home at time of visit.  I discussed the limitations, risks, security and privacy concerns of performing an evaluation and management service by telephone and the availability of in person appointments. I also discussed with the patient that there may be a patient responsible charge related to this service. The patient expressed understanding and agreed to proceed.  Subjective: PCP: [name], DO  Chief Complaint  Patient presents with  . Back Pain   Patient c/o low back pain that has not improved at all with use of meloxicam 15 mg that was rx'd by her PCP last week. She reports she has even taken two of them. She reports she is barely able to get out of the bed she is in so much pain. She reports she is going to a chiropractor who has imaged her and told her that it is not her back that is causing the pain, but her hips. She reports she was told she does not have a leg length discrepancy but that her left hip has shifted. She also reports she was told it would take at least 8 visits to correct this and that she should wait to see her orthopedic until this has been completed. She has been unable to complete her therapy exercises due to the pain.   ROS: Per HPI  Current Outpatient Medications:  .  acetaminophen (TYLENOL) 500 MG tablet, Take 500 mg by mouth every 6 (six) hours as needed for mild pain or moderate pain., Disp: , Rfl:  .  Cholecalciferol (VITAMIN D-3) 125 MCG (5000 UT) TABS, Take 1 tablet by mouth daily., Disp: 90 tablet, Rfl: 3 .  cyclobenzaprine (FLEXERIL) 10 MG tablet, Take 1 tablet (10 mg total) by mouth 3 (three) times daily., Disp:  20 tablet, Rfl: 0 .  dexamethasone (DECADRON) 4 MG tablet, Take 1 tablet (4 mg total) by mouth 2 (two) times daily with a meal., Disp: 10 tablet, Rfl: 0 .  fluconazole (DIFLUCAN) 150 MG tablet, 1 po q week x 4 weeks, Disp: 4 tablet, Rfl: 0 .  meloxicam (MOBIC) 15 MG tablet, Take 1 tablet (15 mg total) by mouth daily as needed for pain (take with food)., Disp: 30 tablet, Rfl: 0  Allergies  Allergen Reactions  . Metoclopramide Other (See Comments) and Swelling    Tardive dyskinesia  . Ondansetron Nausea And Vomiting   Past Medical History:  Diagnosis Date  . Acute post-traumatic headache, not intractable 01/19/2016  . Asthma    ALbuterol inhaler as needed  . CHF (congestive heart failure) ([place]) 2011   RESOLVED; HAD CARDIAC EVAL   . Chronic back pain    reason unknown  . Congestive heart failure ([place]) 2011   related to preeclampsia  . Nocturia   . Noncompliance   . Postgastrectomy malabsorption     Observations/Objective: A&O  No respiratory distress or wheezing audible over the phone Mood, judgement, and thought processes all WNL  Assessment and Plan: 1. Chronic bilateral low back pain without sciatica - Discussed with patient that she needs to schedule an appointment with her orthopedic (Dr. Junius Roads) as it may take weeks to get an appointment and that  if she is feeling great and no longer needs the appointment she can cancel it. I did give a short term prescription of Tramadol after reviewing PDMP. She understands this will not continue long term.  - traMADol (ULTRAM) 50 MG tablet; Take 1 tablet (50 mg total) by mouth every 6 (six) hours as needed for up to 5 days.  Dispense: 20 tablet; Refill: 0   Follow Up Instructions:  I discussed the assessment and treatment plan with the patient. The patient was provided an opportunity to ask questions and all were answered. The patient agreed with the plan and demonstrated an understanding of the instructions.   The patient was advised to  call back or seek an in-person evaluation if the symptoms worsen or if the condition fails to improve as anticipated.  The above assessment and management plan was discussed with the patient. The patient verbalized understanding of and has agreed to the management plan. Patient is aware to call the clinic if symptoms persist or worsen. Patient is aware when to return to the clinic for a follow-up visit. Patient educated on when it is appropriate to go to the emergency department.   Time call ended: 2:03 PM  I provided 15 minutes of non-face-to-face time during this encounter.  Hendricks Limes, MSN, APRN, FNP-C [facility] 10/24/19

## 2019-10-25 ENCOUNTER — Ambulatory Visit: Payer: Medicaid Other | Attending: Family Medicine | Admitting: Physical Therapy

## 2019-10-25 ENCOUNTER — Telehealth: Payer: Self-pay | Admitting: Physical Therapy

## 2019-10-25 NOTE — Telephone Encounter (Signed)
No show. PT attempted to call pt regarding missed appointment. PT was unable to reach pt due to "calling restrictions".    1:29 PM,10/25/19 Sherol Dade PT, [place] [facility] [place]

## 2019-11-28 ENCOUNTER — Encounter: Payer: Self-pay | Admitting: Family Medicine

## 2019-11-28 ENCOUNTER — Ambulatory Visit (INDEPENDENT_AMBULATORY_CARE_PROVIDER_SITE_OTHER): Payer: Medicaid Other | Admitting: Family Medicine

## 2019-11-28 DIAGNOSIS — J069 Acute upper respiratory infection, unspecified: Secondary | ICD-10-CM | POA: Diagnosis not present

## 2019-11-28 NOTE — Progress Notes (Signed)
Virtual Visit via Telephone Note  I connected with Brenda Spencer on 11/28/19 at 4:29 AM by telephone and verified that I am speaking with the correct person using two identifiers. Brenda Spencer is currently located at home and nobody is currently with her during this visit. The provider, Loman Brooklyn, FNP is located in their office at time of visit.  I discussed the limitations, risks, security and privacy concerns of performing an evaluation and management service by telephone and the availability of in person appointments. I also discussed with the patient that there may be a patient responsible charge related to this service. The patient expressed understanding and agreed to proceed.  Subjective: PCP: [name], DO  Chief Complaint  Patient presents with  . Sore Throat   Patient complains of head congestion, runny nose, sore throat and ear pain/pressure. Additional symptoms include cough and sneezing. Onset of symptoms was 1 day ago, unchanged since that time. She is drinking plenty of fluids. Evaluation to date: none. Treatment to date: alka-selzter. She has a history of asthma. She does not smoke.    ROS: Per HPI  Current Outpatient Medications:  .  acetaminophen (TYLENOL) 500 MG tablet, Take 500 mg by mouth every 6 (six) hours as needed for mild pain or moderate pain., Disp: , Rfl:  .  Cholecalciferol (VITAMIN D-3) 125 MCG (5000 UT) TABS, Take 1 tablet by mouth daily., Disp: 90 tablet, Rfl: 3 .  cyclobenzaprine (FLEXERIL) 10 MG tablet, Take 1 tablet (10 mg total) by mouth 3 (three) times daily., Disp: 20 tablet, Rfl: 0 .  dexamethasone (DECADRON) 4 MG tablet, Take 1 tablet (4 mg total) by mouth 2 (two) times daily with a meal., Disp: 10 tablet, Rfl: 0 .  fluconazole (DIFLUCAN) 150 MG tablet, 1 po q week x 4 weeks, Disp: 4 tablet, Rfl: 0 .  meloxicam (MOBIC) 15 MG tablet, Take 1 tablet (15 mg total) by mouth daily as needed for pain (take with food)., Disp: 30  tablet, Rfl: 0  Allergies  Allergen Reactions  . Metoclopramide Other (See Comments) and Swelling    Tardive dyskinesia  . Ondansetron Nausea And Vomiting   Past Medical History:  Diagnosis Date  . Acute post-traumatic headache, not intractable 01/19/2016  . Asthma    ALbuterol inhaler as needed  . CHF (congestive heart failure) ([place]) 2011   RESOLVED; HAD CARDIAC EVAL   . Chronic back pain    reason unknown  . Congestive heart failure ([place]) 2011   related to preeclampsia  . Nocturia   . Noncompliance   . Postgastrectomy malabsorption     Observations/Objective: A&O  No respiratory distress or wheezing audible over the phone Mood, judgement, and thought processes all WNL  Assessment and Plan: 1. Upper respiratory tract infection, unspecified type - Encouraged patient to go get tested for COVID-19.  Discussed symptom management.  Education provided on COVID-19.   Follow Up Instructions:  I discussed the assessment and treatment plan with the patient. The patient was provided an opportunity to ask questions and all were answered. The patient agreed with the plan and demonstrated an understanding of the instructions.   The patient was advised to call back or seek an in-person evaluation if the symptoms worsen or if the condition fails to improve as anticipated.  The above assessment and management plan was discussed with the patient. The patient verbalized understanding of and has agreed to the management plan. Patient is aware to call the clinic  if symptoms persist or worsen. Patient is aware when to return to the clinic for a follow-up visit. Patient educated on when it is appropriate to go to the emergency department.   Time call ended: 4:35 PM  I provided 9 minutes of non-face-to-face time during this encounter.  Hendricks Limes, MSN, APRN, FNP-C [facility] 11/28/19

## 2019-11-28 NOTE — Patient Instructions (Signed)
This information is directly available on the CDC website: https://www.cdc.gov/coronavirus/2019-ncov/if-you-are-sick/steps-when-sick.html    Source:CDC Reference to specific commercial products, manufacturers, companies, or trademarks does not constitute its endorsement or recommendation by the U.S. Government, Department of Health and Human Services, or Centers for Disease Control and Prevention.  

## 2019-12-01 ENCOUNTER — Ambulatory Visit: Payer: Medicaid Other | Attending: Internal Medicine

## 2019-12-01 ENCOUNTER — Other Ambulatory Visit: Payer: Self-pay

## 2019-12-01 DIAGNOSIS — U071 COVID-19: Secondary | ICD-10-CM

## 2019-12-01 DIAGNOSIS — Z20822 Contact with and (suspected) exposure to covid-19: Secondary | ICD-10-CM

## 2019-12-02 LAB — NOVEL CORONAVIRUS, NAA: SARS-CoV-2, NAA: NOT DETECTED

## 2019-12-05 ENCOUNTER — Other Ambulatory Visit: Payer: Medicaid Other

## 2019-12-28 ENCOUNTER — Ambulatory Visit (INDEPENDENT_AMBULATORY_CARE_PROVIDER_SITE_OTHER): Payer: Medicaid Other | Admitting: Family Medicine

## 2019-12-28 ENCOUNTER — Encounter: Payer: Self-pay | Admitting: Family Medicine

## 2019-12-28 DIAGNOSIS — M545 Low back pain: Secondary | ICD-10-CM

## 2019-12-28 DIAGNOSIS — G8929 Other chronic pain: Secondary | ICD-10-CM

## 2019-12-28 NOTE — Progress Notes (Signed)
Virtual Visit via telephone Note Due to COVID-19 pandemic this visit was conducted virtually. This visit type was conducted due to national recommendations for restrictions regarding the COVID-19 Pandemic (e.g. social distancing, sheltering in place) in an effort to limit this patient's exposure and mitigate transmission in our community. All issues noted in this document were discussed and addressed.  A physical exam was not performed with this format.   I connected with Brenda Spencer on 12/28/2019 at 1000 by telephone and verified that I am speaking with the correct person using two identifiers. Brenda Spencer is currently located at home and family is currently with them during visit. The provider, Monia Pouch, FNP is located in their office at time of visit.  I discussed the limitations, risks, security and privacy concerns of performing an evaluation and management service by telephone and the availability of in person appointments. I also discussed with the patient that there may be a patient responsible charge related to this service. The patient expressed understanding and agreed to proceed.  Subjective:  Patient ID: Brenda Spencer, female    DOB: Feb 15, 1991, 29 y.o.   MRN: [id number]  Chief Complaint:  Back Pain   HPI: [name] is a 29 y.o. female presenting on 12/28/2019 for Back Pain   Pt reports ongoing lower back pain. She has been evaluated for this by her PCP and orthopedics for same complaint. She was referred to PT but states this made the pain worse. She has tried NSAIDs, muscle relaxants, and steroids without relief of symptoms. She was recently placed on Ultram for the pain and states this was not beneficial. States the pain is worse with ambulating or bearing weight. States she needs relief from the pain. No loss of bowel or bladder, saddle anesthesia, loss or function, or weakness. States pain can be 8-10 / 10 on some days.  Back Pain This is a  chronic problem. The current episode started more than 1 month ago. The problem occurs daily. The problem has been waxing and waning since onset. The pain is present in the lumbar spine. The quality of the pain is described as burning, cramping, shooting, aching and stabbing. The pain is at a severity of 8/10. The pain is severe. The pain is worse during the day. The symptoms are aggravated by bending, position, sitting, standing, twisting, stress and lying down. Pertinent negatives include no abdominal pain, bladder incontinence, bowel incontinence, chest pain, dysuria, fever, headaches, leg pain, numbness, paresis, paresthesias, pelvic pain, perianal numbness, tingling, weakness or weight loss. She has tried analgesics, muscle relaxant, NSAIDs, chiropractic manipulation and home exercises for the symptoms. The treatment provided no relief.     Relevant past medical, surgical, family, and social history reviewed and updated as indicated.  Allergies and medications reviewed and updated.   Past Medical History:  Diagnosis Date  . Acute post-traumatic headache, not intractable 01/19/2016  . Asthma    ALbuterol inhaler as needed  . CHF (congestive heart failure) ([place]) 2011   RESOLVED; HAD CARDIAC EVAL   . Chronic back pain    reason unknown  . Congestive heart failure ([place]) 2011   related to preeclampsia  . Nocturia   . Noncompliance   . Postgastrectomy malabsorption     Past Surgical History:  Procedure Laterality Date  . BREAST ENHANCEMENT SURGERY    . CHOLECYSTECTOMY     teenager  . SLEEVE GASTROPLASTY  01/18/2017  . SUBOCCIPITAL CRANIECTOMY CERVICAL LAMINECTOMY N/A 06/09/2016   Procedure:  Chiari Decompression;  Surgeon: Consuella Lose, MD;  Location: [facility];  Service: Neurosurgery;  Laterality: N/A;  posterior/occipital  . TEAR DUCT PROBING  as a child  . TONSILLECTOMY     as a teenager  . TUBAL LIGATION Bilateral 09/10/2015   Procedure: POST PARTUM TUBAL LIGATION;  Surgeon:  Mora Bellman, MD;  Location: [facility];  Service: Gynecology;  Laterality: Bilateral;  . WISDOM TOOTH EXTRACTION     as a teenager    Social History   Socioeconomic History  . Marital status: Married    Spouse name: CHRISTOPHER  . Number of children: 1  . Years of education: 31  . Highest education level: Not on file  Occupational History  . Occupation: HOMEMAKER  Tobacco Use  . Smoking status: Never Smoker  . Smokeless tobacco: Never Used  Substance and Sexual Activity  . Alcohol use: No    Alcohol/week: 0.0 standard drinks  . Drug use: Not Currently    Types: Marijuana  . Sexual activity: Yes    Partners: Male    Birth control/protection: Surgical  Other Topics Concern  . Not on file  Social History Narrative   3-4 cups of tea and soda a day    Social Determinants of Health   Financial Resource Strain:   . Difficulty of Paying Living Expenses: Not on file  Food Insecurity:   . Worried About Charity fundraiser in the Last Year: Not on file  . Ran Out of Food in the Last Year: Not on file  Transportation Needs:   . Lack of Transportation (Medical): Not on file  . Lack of Transportation (Non-Medical): Not on file  Physical Activity:   . Days of Exercise per Week: Not on file  . Minutes of Exercise per Session: Not on file  Stress:   . Feeling of Stress : Not on file  Social Connections:   . Frequency of Communication with Friends and Family: Not on file  . Frequency of Social Gatherings with Friends and Family: Not on file  . Attends Religious Services: Not on file  . Active Member of Clubs or Organizations: Not on file  . Attends Archivist Meetings: Not on file  . Marital Status: Not on file  Intimate Partner Violence:   . Fear of Current or Ex-Partner: Not on file  . Emotionally Abused: Not on file  . Physically Abused: Not on file  . Sexually Abused: Not on file    Outpatient Encounter Medications as of 12/28/2019  Medication Sig  .  acetaminophen (TYLENOL) 500 MG tablet Take 500 mg by mouth every 6 (six) hours as needed for mild pain or moderate pain.  . Cholecalciferol (VITAMIN D-3) 125 MCG (5000 UT) TABS Take 1 tablet by mouth daily.   No facility-administered encounter medications on file as of 12/28/2019.    Allergies  Allergen Reactions  . Metoclopramide Other (See Comments) and Swelling    Tardive dyskinesia  . Ondansetron Nausea And Vomiting    Review of Systems  Constitutional: Negative for activity change, appetite change, chills, diaphoresis, fatigue, fever, unexpected weight change and weight loss.  HENT: Negative.   Eyes: Negative.   Respiratory: Negative for cough, chest tightness and shortness of breath.   Cardiovascular: Negative for chest pain, palpitations and leg swelling.  Gastrointestinal: Negative for abdominal pain, blood in stool, bowel incontinence, constipation, diarrhea, nausea and vomiting.  Endocrine: Negative.   Genitourinary: Negative for bladder incontinence, decreased urine volume, difficulty urinating, dysuria, frequency,  pelvic pain and urgency.  Musculoskeletal: Positive for arthralgias, back pain, gait problem and myalgias.  Skin: Negative.   Allergic/Immunologic: Negative.   Neurological: Negative for dizziness, tingling, weakness, numbness, headaches and paresthesias.  Hematological: Negative.   Psychiatric/Behavioral: Negative for confusion, hallucinations, sleep disturbance and suicidal ideas.  All other systems reviewed and are negative.        Observations/Objective: No vital signs or physical exam, this was a telephone or virtual health encounter.  Pt alert and oriented, answers all questions appropriately, and able to speak in full sentences.    Assessment and Plan: Earle was seen today for back pain.  Diagnoses and all orders for this visit:  Chronic bilateral low back pain without sciatica Ongoing lower back pain without sciatica or red flags concerning  for cauda equina syndrome. Has been evaluated by PCP and Ortho for same complaint. No relief with tried therapies. Will refer to pain management for evaluation and treatment.  -     Ambulatory referral to Pain Clinic     Follow Up Instructions: Return if symptoms worsen or fail to improve.    I discussed the assessment and treatment plan with the patient. The patient was provided an opportunity to ask questions and all were answered. The patient agreed with the plan and demonstrated an understanding of the instructions.   The patient was advised to call back or seek an in-person evaluation if the symptoms worsen or if the condition fails to improve as anticipated.  The above assessment and management plan was discussed with the patient. The patient verbalized understanding of and has agreed to the management plan. Patient is aware to call the clinic if they develop any new symptoms or if symptoms persist or worsen. Patient is aware when to return to the clinic for a follow-up visit. Patient educated on when it is appropriate to go to the emergency department.    I provided 15 minutes of non-face-to-face time during this encounter. The call started at 1000. The call ended at 1015. The other time was used for coordination of care.    Monia Pouch, FNP-C [facility] [address] [place], [place] [address] [phone] 12/28/2019

## 2020-01-02 IMAGING — DX DG FOOT COMPLETE 3+V*R*
3 series · 3 of 3 positions shown · non-contrast
Comparison: None.

CLINICAL DATA: 28-year-old female with back pain radiating to the
right foot since last week. Recent prolonged driving.

EXAM:
RIGHT FOOT COMPLETE - 3+ VIEW

[foot ap]
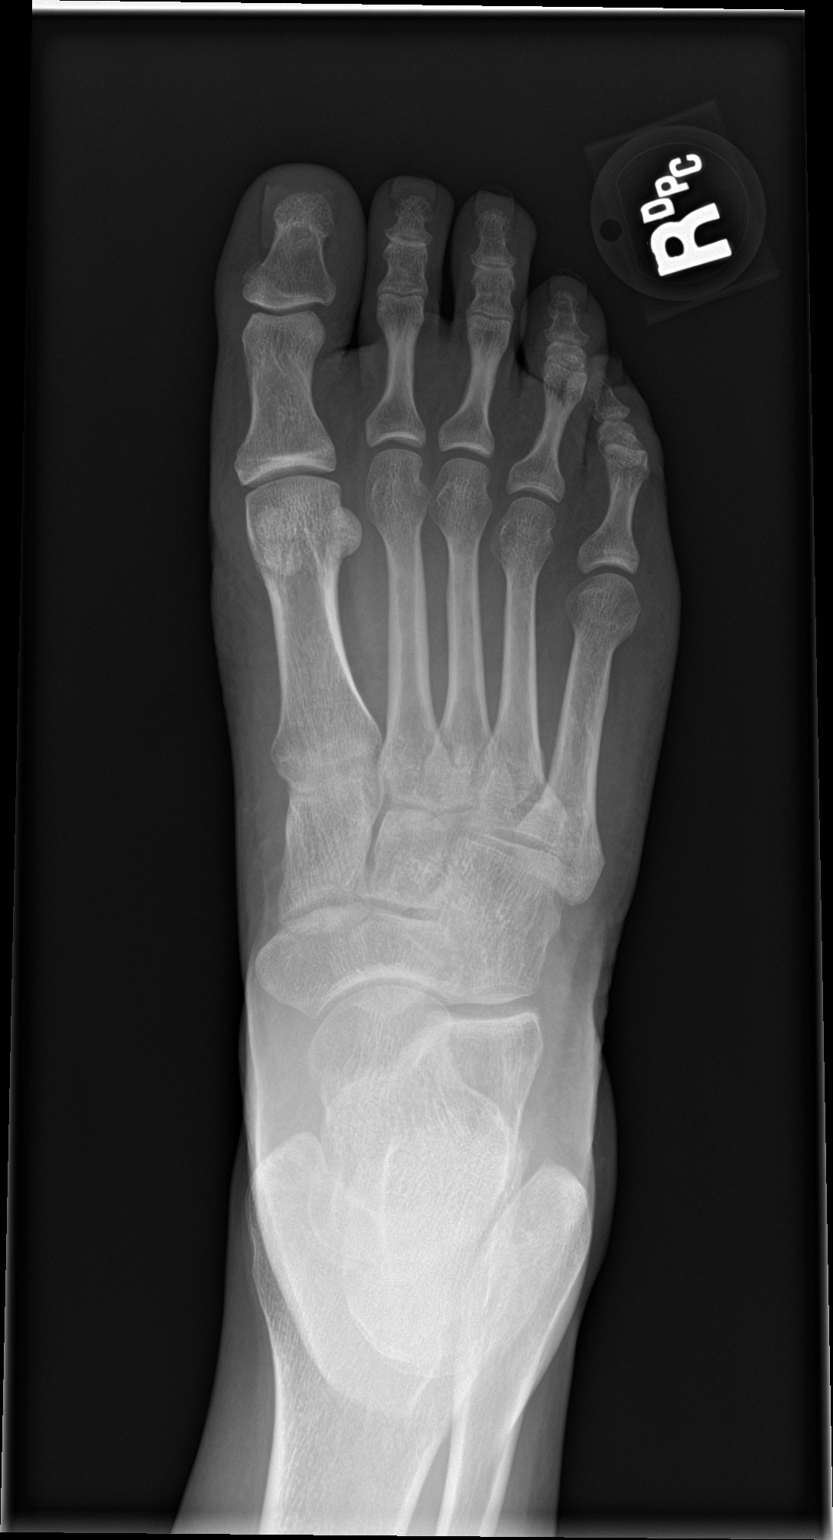

[foot obl]
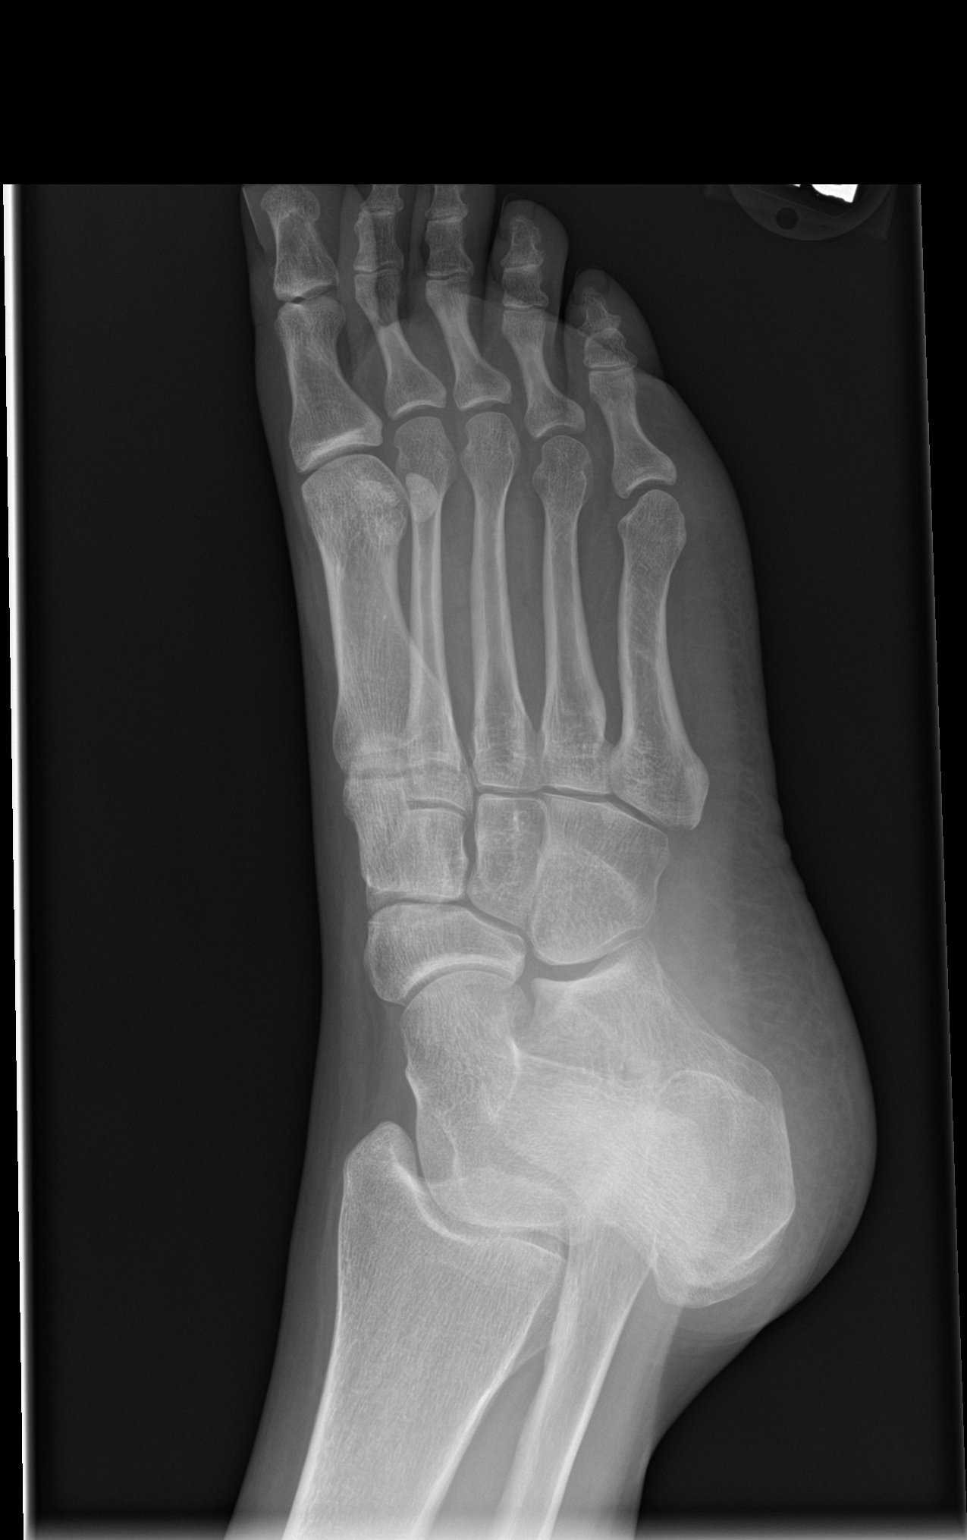

[foot lat]
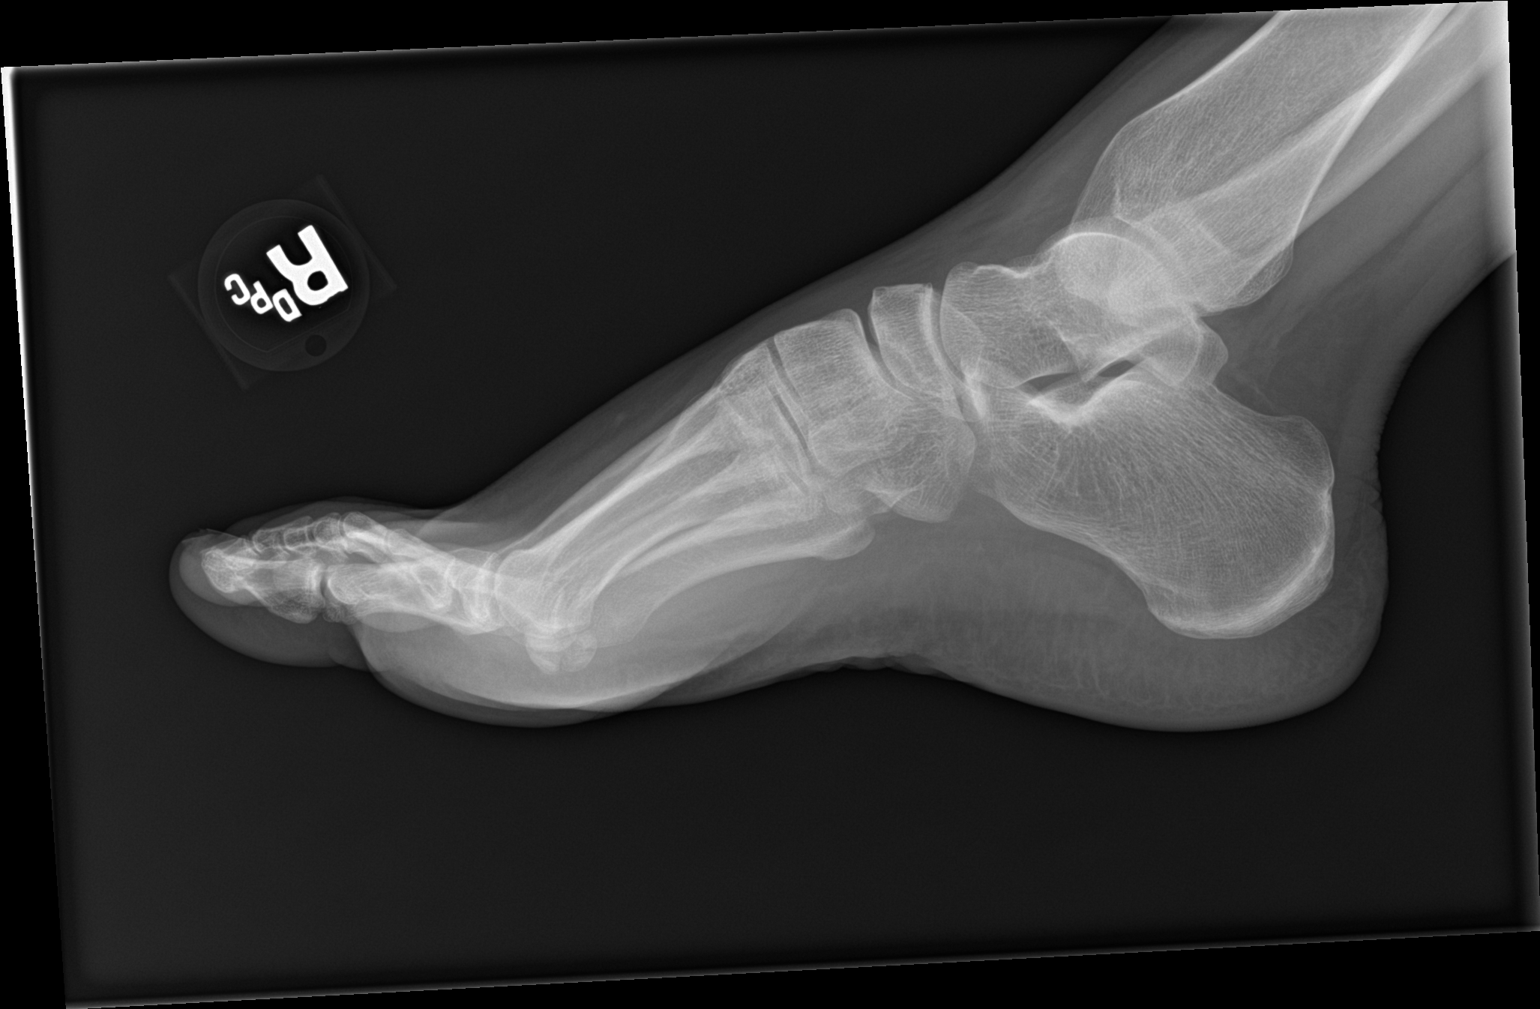

[3 of 3 positions shown; findings below may reference images not displayed]

FINDINGS: Bone mineralization is within normal limits. Suspect a remote healed
fracture at the base of the right 5th metatarsal. There is no
evidence of acute fracture or dislocation. There is no evidence of
arthropathy or other focal bone abnormality. Soft tissues are
unremarkable.
IMPRESSION: No acute osseous abnormality identified.

## 2020-01-19 ENCOUNTER — Ambulatory Visit: Payer: Medicaid Other | Attending: Internal Medicine

## 2020-02-05 ENCOUNTER — Telehealth: Payer: Self-pay | Admitting: Family Medicine

## 2020-02-05 NOTE — Telephone Encounter (Signed)
REFERRAL REQUEST Telephone Note  What type of referral do you need? Nuerologist for headaches  Have you been seen at our office for this problem? Not lately (Advise that they will likely need an appointment with their PCP before a referral can be done)  Is there a particular doctor or location that you prefer? [facility]  Patient notified that referrals can take up to a week or longer to process. If they haven't heard anything within a week they should call back and speak with the referral department.

## 2020-02-05 NOTE — Telephone Encounter (Signed)
appt made for pt because we have not seen for headaches

## 2020-02-13 ENCOUNTER — Ambulatory Visit (INDEPENDENT_AMBULATORY_CARE_PROVIDER_SITE_OTHER): Payer: Medicaid Other | Admitting: Family Medicine

## 2020-02-13 DIAGNOSIS — G4452 New daily persistent headache (NDPH): Secondary | ICD-10-CM | POA: Diagnosis not present

## 2020-02-13 DIAGNOSIS — G935 Compression of brain: Secondary | ICD-10-CM

## 2020-02-13 NOTE — Progress Notes (Signed)
Telephone visit  Subjective: CC: headache PCP: [name], DO SV:[id number] Brenda Spencer is a 29 y.o. female calls for telephone consult today. Patient provides verbal consent for consult held via phone.  Due to COVID-19 pandemic this visit was conducted virtually. This visit type was conducted due to national recommendations for restrictions regarding the COVID-19 Pandemic (e.g. social distancing, sheltering in place) in an effort to limit this patient's exposure and mitigate transmission in our community. All issues noted in this document were discussed and addressed.  A physical exam was not performed with this format.   Location of patient: home Location of provider: [facility] Others present for call: none  1. Headache She reports chronic headaches that used to occur frequently.  She saw a neurologist and had MRI of brain which showed Chiari malformation.  She had surgery and symptoms resolved.  She reports that headaches began again 3 weeks ago and have continued to progress in severity since.  She now reports photophobia.  Denies balance problems, weakness, falls, speech difficulty, double vision, loss of vision, nausea, vomiting.  ROS: Per HPI  Allergies  Allergen Reactions  . Metoclopramide Other (See Comments) and Swelling    Tardive dyskinesia  . Ondansetron Nausea And Vomiting   Past Medical History:  Diagnosis Date  . Acute post-traumatic headache, not intractable 01/19/2016  . Asthma    ALbuterol inhaler as needed  . CHF (congestive heart failure) ([place]) 2011   RESOLVED; HAD CARDIAC EVAL   . Chronic back pain    reason unknown  . Congestive heart failure ([place]) 2011   related to preeclampsia  . Nocturia   . Noncompliance   . Postgastrectomy malabsorption     Current Outpatient Medications:  .  acetaminophen (TYLENOL) 500 MG tablet, Take 500 mg by mouth every 6 (six) hours as needed for mild pain or moderate pain., Disp: , Rfl:  .  Cholecalciferol (VITAMIN Brenda-3) 125  MCG (5000 UT) TABS, Take 1 tablet by mouth daily., Disp: 90 tablet, Rfl: 3  Assessment/ Plan: 29 y.o. female   1. Arnold-Chiari malformation, type I ([place]) Very concerning for increasing pressures given new onset of progressive daily headache.  Stat MRI brain ordered.  We will also plan to refer back to neurosurgery and possibly neurology pending results.  Patient aware of red flag signs symptoms warranting further evaluation emergency department. - MR Brain Wo Contrast; Future - Ambulatory referral to Neurosurgery  2. New daily persistent headache - MR Brain Wo Contrast; Future - Ambulatory referral to Neurosurgery   Start time: 4:35pm End time: 4:45pm  Total time spent on patient care (including telephone call/ virtual visit): 20 minutes  [place], [place] [phone]

## 2020-02-15 ENCOUNTER — Telehealth: Payer: Self-pay | Admitting: Family Medicine

## 2020-02-16 ENCOUNTER — Telehealth: Payer: Self-pay | Admitting: Family Medicine

## 2020-02-16 ENCOUNTER — Ambulatory Visit (HOSPITAL_COMMUNITY): Payer: Medicaid Other

## 2020-02-17 ENCOUNTER — Ambulatory Visit (HOSPITAL_COMMUNITY): Payer: Medicaid Other | Attending: Family Medicine

## 2020-03-04 ENCOUNTER — Encounter (HOSPITAL_COMMUNITY): Payer: Self-pay

## 2020-03-04 ENCOUNTER — Emergency Department (HOSPITAL_COMMUNITY)
Admission: EM | Admit: 2020-03-04 | Discharge: 2020-03-04 | Disposition: A | Discharge status: Home / Self Care | Payer: Medicaid Other | Attending: Emergency Medicine | Admitting: Emergency Medicine

## 2020-03-04 ENCOUNTER — Telehealth (INDEPENDENT_AMBULATORY_CARE_PROVIDER_SITE_OTHER): Payer: Medicaid Other | Admitting: Family Medicine

## 2020-03-04 ENCOUNTER — Other Ambulatory Visit: Payer: Self-pay

## 2020-03-04 DIAGNOSIS — Z9049 Acquired absence of other specified parts of digestive tract: Secondary | ICD-10-CM | POA: Diagnosis not present

## 2020-03-04 DIAGNOSIS — R102 Pelvic and perineal pain: Secondary | ICD-10-CM | POA: Diagnosis present

## 2020-03-04 DIAGNOSIS — N3000 Acute cystitis without hematuria: Secondary | ICD-10-CM | POA: Insufficient documentation

## 2020-03-04 DIAGNOSIS — I509 Heart failure, unspecified: Secondary | ICD-10-CM | POA: Insufficient documentation

## 2020-03-04 DIAGNOSIS — J45909 Unspecified asthma, uncomplicated: Secondary | ICD-10-CM | POA: Diagnosis not present

## 2020-03-04 LAB — POC URINE PREG, ED: Preg Test, Ur: NEGATIVE

## 2020-03-04 LAB — URINALYSIS, MICROSCOPIC (REFLEX): WBC, UA: >50 WBC/hpf (ref 0–5)

## 2020-03-04 LAB — URINALYSIS, ROUTINE W REFLEX MICROSCOPIC
Glucose, UA: NEGATIVE mg/dL
Nitrite: NEGATIVE
Protein, ur: 100 mg/dL — AB
Specific Gravity, Urine: >1.03 — ABNORMAL HIGH (ref 1.005–1.030)
pH: 6.5 (ref 5.0–8.0)

## 2020-03-04 LAB — WET PREP, GENITAL
Clue Cells Wet Prep HPF POC: NONE SEEN
Sperm: NONE SEEN
Trich, Wet Prep: NONE SEEN
Yeast Wet Prep HPF POC: NONE SEEN

## 2020-03-04 MED ORDER — TRAMADOL HCL 50 MG PO TABS: 50.0000 mg | ORAL_TABLET | Freq: Four times a day (QID) | ORAL | 0 refills | Status: DC | PRN

## 2020-03-04 MED ORDER — TRAMADOL HCL 50 MG PO TABS
50.0000 mg | ORAL_TABLET | Freq: Once | ORAL | Status: AC
  Administered 2020-03-04: 50 mg via ORAL
  Filled 2020-03-04: qty 1

## 2020-03-04 MED ORDER — CEPHALEXIN 500 MG PO CAPS: 500.0000 mg | ORAL_CAPSULE | Freq: Three times a day (TID) | ORAL | 0 refills | Status: DC
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
## 2020-03-04 NOTE — Progress Notes (Signed)
Being treated in ED.  Appt not needed
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
## 2020-03-04 NOTE — Discharge Instructions (Addendum)
Begin taking Keflex as prescribed.  Take tramadol as prescribed as needed for pain.  Follow-up with your primary doctor/GYN if symptoms or not improving in the next few days.  We will call you if your cultures indicate you require additional treatment or the need to take additional action.

## 2020-03-05 LAB — GC/CHLAMYDIA PROBE AMP (~~LOC~~) NOT AT ARMC
Chlamydia: NEGATIVE
Neisseria Gonorrhea: NEGATIVE

## 2020-03-06 LAB — URINE CULTURE
Culture: 90000 — AB
Special Requests: NORMAL

## 2020-03-07 ENCOUNTER — Telehealth: Payer: Self-pay | Admitting: *Deleted

## 2020-03-07 NOTE — Telephone Encounter (Signed)
Post ED Visit - Positive Culture Follow-up  Culture report reviewed by antimicrobial stewardship pharmacist: [place] Team []  Elenor Quinones, Pharm.D. []  Heide Guile, Pharm.D., BCPS AQ-ID []  Parks Neptune, Pharm.D., BCPS []  Alycia Rossetti, Pharm.D., BCPS []  [place], [place].D., BCPS, AAHIVP []  Legrand Como, Pharm.D., BCPS, AAHIVP []  Salome Arnt, PharmD, BCPS []  Johnnette Gourd, PharmD, BCPS []  Hughes Better, PharmD, BCPS []  Leeroy Cha, PharmD []  Laqueta Linden, PharmD, BCPS []  Albertina Parr, PharmD Eliberto Ivory, [place] Team []  Leodis Sias, PharmD []  Lindell Spar, PharmD []  Royetta Asal, PharmD []  Graylin Shiver, Rph []  Rema Fendt) Glennon Mac, PharmD []  Arlyn Dunning, PharmD []  Netta Cedars, PharmD []  Dia Sitter, PharmD []  Leone Haven, PharmD []  Gretta Arab, PharmD []  Theodis Shove, PharmD []  Peggyann Juba, PharmD []  Reuel Boom, PharmD   Positive urine culture Treated with Cephalexin, organism sensitive to the same and no further patient follow-up is required at this time.  Harlon Flor [facility] 03/07/2020, 10:19 AM

## 2020-03-20 ENCOUNTER — Other Ambulatory Visit: Payer: Self-pay | Admitting: Family Medicine

## 2020-03-20 ENCOUNTER — Telehealth: Payer: Self-pay | Admitting: Family Medicine

## 2020-03-20 DIAGNOSIS — F410 Panic disorder [episodic paroxysmal anxiety] without agoraphobia: Secondary | ICD-10-CM

## 2020-03-20 DIAGNOSIS — F332 Major depressive disorder, recurrent severe without psychotic features: Secondary | ICD-10-CM

## 2020-03-20 NOTE — Telephone Encounter (Signed)
Patient would like a referral to get counseling and help.  Please review and advise.

## 2020-03-20 NOTE — Telephone Encounter (Signed)
Referral to psychiatry placed.  Please offer her our office handout on options for counseling services.

## 2020-03-21 NOTE — Telephone Encounter (Signed)
Copy of counseling providers phone numbers mailed to patient.

## 2020-03-24 ENCOUNTER — Ambulatory Visit (HOSPITAL_COMMUNITY): Admission: RE | Admit: 2020-03-24 | Payer: Medicaid Other | Source: Ambulatory Visit

## 2020-03-26 ENCOUNTER — Encounter (HOSPITAL_COMMUNITY): Payer: Self-pay | Admitting: Emergency Medicine

## 2020-03-26 ENCOUNTER — Emergency Department (HOSPITAL_COMMUNITY)
Admission: EM | Admit: 2020-03-26 | Discharge: 2020-03-26 | Disposition: A | Discharge status: Home / Self Care | Payer: Medicaid Other | Attending: Emergency Medicine | Admitting: Emergency Medicine

## 2020-03-26 DIAGNOSIS — I509 Heart failure, unspecified: Secondary | ICD-10-CM | POA: Insufficient documentation

## 2020-03-26 DIAGNOSIS — J45909 Unspecified asthma, uncomplicated: Secondary | ICD-10-CM | POA: Insufficient documentation

## 2020-03-26 DIAGNOSIS — Z9049 Acquired absence of other specified parts of digestive tract: Secondary | ICD-10-CM | POA: Insufficient documentation

## 2020-03-26 DIAGNOSIS — F1510 Other stimulant abuse, uncomplicated: Secondary | ICD-10-CM

## 2020-03-26 DIAGNOSIS — F19951 Other psychoactive substance use, unspecified with psychoactive substance-induced psychotic disorder with hallucinations: Secondary | ICD-10-CM | POA: Diagnosis not present

## 2020-03-26 DIAGNOSIS — F410 Panic disorder [episodic paroxysmal anxiety] without agoraphobia: Secondary | ICD-10-CM | POA: Diagnosis not present

## 2020-03-26 DIAGNOSIS — Z9884 Bariatric surgery status: Secondary | ICD-10-CM | POA: Insufficient documentation

## 2020-03-26 DIAGNOSIS — R443 Hallucinations, unspecified: Secondary | ICD-10-CM | POA: Diagnosis present

## 2020-03-26 DIAGNOSIS — F1590 Other stimulant use, unspecified, uncomplicated: Secondary | ICD-10-CM | POA: Diagnosis not present

## 2020-03-26 NOTE — ED Triage Notes (Signed)
Patient here from home with complaints of hallucinations after doing meth. States that she started using meth in January because of boyfriend . Denies SI, HI.

## 2020-03-26 NOTE — ED Notes (Signed)
Pt DCd off unit to home per provider. Pt alert, calm,cooperative, no s/s of distress. DC information given to and reviewed with pt. Belongings given to pt. Belongings in safe given to pt. Pt ambulatory off unit, escorted by RN. Pt transported by family
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
## 2020-03-26 NOTE — Discharge Instructions (Addendum)
Please do not use recreational drugs.  If at any point you feel unsafe please return to the emergency room.

## 2020-04-12 ENCOUNTER — Other Ambulatory Visit: Payer: Self-pay

## 2020-04-12 ENCOUNTER — Ambulatory Visit (HOSPITAL_COMMUNITY)
Admission: RE | Admit: 2020-04-12 | Discharge: 2020-04-12 | Disposition: A | Discharge status: Home / Self Care | Payer: Medicaid Other | Source: Ambulatory Visit | Attending: Family Medicine | Admitting: Family Medicine

## 2020-04-12 DIAGNOSIS — G4452 New daily persistent headache (NDPH): Secondary | ICD-10-CM | POA: Insufficient documentation

## 2020-04-12 DIAGNOSIS — G935 Compression of brain: Secondary | ICD-10-CM | POA: Diagnosis present

## 2020-04-17 ENCOUNTER — Other Ambulatory Visit: Payer: Self-pay

## 2020-04-17 ENCOUNTER — Emergency Department (HOSPITAL_BASED_OUTPATIENT_CLINIC_OR_DEPARTMENT_OTHER)
Admission: EM | Admit: 2020-04-17 | Discharge: 2020-04-17 | Disposition: A | Discharge status: Home / Self Care | Payer: Medicaid Other | Attending: Emergency Medicine | Admitting: Emergency Medicine

## 2020-04-17 DIAGNOSIS — R1031 Right lower quadrant pain: Secondary | ICD-10-CM | POA: Diagnosis not present

## 2020-04-17 DIAGNOSIS — Z5321 Procedure and treatment not carried out due to patient leaving prior to being seen by health care provider: Secondary | ICD-10-CM | POA: Insufficient documentation

## 2020-04-17 DIAGNOSIS — M545 Low back pain: Secondary | ICD-10-CM | POA: Insufficient documentation

## 2020-04-17 NOTE — ED Triage Notes (Signed)
Per EMS:  Pt states she has back pain, more to right flank.  Radiates to hip.  Ambulated at scene.  BP 118/70, P110, R16, Pox 98, temp 98.1, CBG 131.  No recent injury.

## 2020-04-17 NOTE — ED Triage Notes (Signed)
Pt states right sided back pain since this am.  Pt denies fever, denies dysuria.

## 2020-06-25 ENCOUNTER — Encounter: Payer: Self-pay | Admitting: Nurse Practitioner

## 2020-06-25 ENCOUNTER — Ambulatory Visit: Payer: Medicaid Other | Admitting: Nurse Practitioner

## 2020-06-25 ENCOUNTER — Other Ambulatory Visit: Payer: Self-pay

## 2020-06-25 VITALS — BP 124/80 | HR 116 | Temp 98.6°F | Ht 60.0 in | Wt 114.4 lb

## 2020-06-25 DIAGNOSIS — M5441 Lumbago with sciatica, right side: Secondary | ICD-10-CM

## 2020-06-25 DIAGNOSIS — K5909 Other constipation: Secondary | ICD-10-CM | POA: Diagnosis not present

## 2020-06-25 MED ORDER — NAPROXEN 500 MG PO TABS: 500.0000 mg | ORAL_TABLET | Freq: Two times a day (BID) | ORAL | 0 refills | Status: DC

## 2020-06-25 MED ORDER — PREDNISONE 10 MG (21) PO TBPK: ORAL_TABLET | ORAL | 0 refills | Status: DC

## 2020-06-25 MED ORDER — METHYLPREDNISOLONE ACETATE 80 MG/ML IJ SUSP
80.0000 mg | Freq: Once | INTRAMUSCULAR | Status: AC
  Administered 2020-06-25: 80 mg via INTRAMUSCULAR

## 2020-06-25 NOTE — Progress Notes (Signed)
Established Patient Office Visit  Subjective:  Patient ID: Brenda Spencer, female    DOB: 1991-04-01  Age: 29 y.o. MRN: [id number]  CC:  Chief Complaint  Patient presents with  . Back Pain    HPI Analyssa D Kube presents for lower back pain.  This is not new for patient.,  Patient was first diagnosed in 2017.  Clinical cause remains unchanged.  Patient reports going to a chiropractor, orthopedic referral and using Tylenol with no therapeutic effect.  Today patient is reporting pain 8 out of 10 on the pain scale of 0-10 right lower back with sciatica.  Patient reports pain keeps her awake at night and she cannot find a comfortable position.  Past Medical History:  Diagnosis Date  . Acute post-traumatic headache, not intractable 01/19/2016  . Asthma    ALbuterol inhaler as needed  . CHF (congestive heart failure) ([place]) 2011   RESOLVED; HAD CARDIAC EVAL   . Chronic back pain    reason unknown  . Congestive heart failure ([place]) 2011   related to preeclampsia  . Nocturia   . Noncompliance   . Postgastrectomy malabsorption     Past Surgical History:  Procedure Laterality Date  . BREAST ENHANCEMENT SURGERY    . CHOLECYSTECTOMY     teenager  . SLEEVE GASTROPLASTY  01/18/2017  . SUBOCCIPITAL CRANIECTOMY CERVICAL LAMINECTOMY N/A 06/09/2016   Procedure: Chiari Decompression;  Surgeon: Consuella Lose, MD;  Location: [facility];  Service: Neurosurgery;  Laterality: N/A;  posterior/occipital  . TEAR DUCT PROBING  as a child  . TONSILLECTOMY     as a teenager  . TUBAL LIGATION Bilateral 09/10/2015   Procedure: POST PARTUM TUBAL LIGATION;  Surgeon: Mora Bellman, MD;  Location: [facility];  Service: Gynecology;  Laterality: Bilateral;  . WISDOM TOOTH EXTRACTION     as a teenager    Family History  Problem Relation Age of Onset  . Learning disabilities Other   . Colon cancer Maternal Grandmother   . Cancer Maternal Grandmother        colon  . Asthma Brother   . Learning  disabilities Brother   . Asthma Daughter   . Learning disabilities Daughter   . Seizures Daughter   . Chromosomal disorder Daughter        1q21.1 microdeletion  . Cataracts Daughter        congenital  . Anesthesia problems Neg Hx   . Other Neg Hx     Social History   Socioeconomic History  . Marital status: Single    Spouse name: CHRISTOPHER  . Number of children: 1  . Years of education: 46  . Highest education level: Not on file  Occupational History  . Occupation: HOMEMAKER  Tobacco Use  . Smoking status: Never Smoker  . Smokeless tobacco: Never Used  Vaping Use  . Vaping Use: Never used  Substance and Sexual Activity  . Alcohol use: No    Alcohol/week: 0.0 standard drinks  . Drug use: Not Currently  . Sexual activity: Yes    Partners: Male    Birth control/protection: Surgical  Other Topics Concern  . Not on file  Social History Narrative   3-4 cups of tea and soda a day    Social Determinants of Health   Financial Resource Strain:   . Difficulty of Paying Living Expenses:   Food Insecurity:   . Worried About Charity fundraiser in the Last Year:   . Arboriculturist in  the Last Year:   Transportation Needs:   . Film/video editor (Medical):   Marland Kitchen Lack of Transportation (Non-Medical):   Physical Activity:   . Days of Exercise per Week:   . Minutes of Exercise per Session:   Stress:   . Feeling of Stress :   Social Connections:   . Frequency of Communication with Friends and Family:   . Frequency of Social Gatherings with Friends and Family:   . Attends Religious Services:   . Active Member of Clubs or Organizations:   . Attends Archivist Meetings:   Marland Kitchen Marital Status:   Intimate Partner Violence:   . Fear of Current or Ex-Partner:   . Emotionally Abused:   Marland Kitchen Physically Abused:   . Sexually Abused:     Outpatient Medications Prior to Visit  Medication Sig Dispense Refill  . acetaminophen (TYLENOL) 500 MG tablet Take 500 mg by mouth  every 6 (six) hours as needed for mild pain or moderate pain.    . cephALEXin (KEFLEX) 500 MG capsule Take 1 capsule (500 mg total) by mouth 3 (three) times daily. (Patient not taking: Reported on 06/25/2020) 21 capsule 0  . Cholecalciferol (VITAMIN D-3) 125 MCG (5000 UT) TABS Take 1 tablet by mouth daily. (Patient not taking: Reported on 06/25/2020) 90 tablet 3  . hydrOXYzine (ATARAX/VISTARIL) 10 MG tablet Take 10 mg by mouth 2 (two) times daily as needed for anxiety. (Patient not taking: Reported on 06/25/2020)    . traMADol (ULTRAM) 50 MG tablet Take 1 tablet (50 mg total) by mouth every 6 (six) hours as needed. (Patient not taking: Reported on 06/25/2020) 10 tablet 0   No facility-administered medications prior to visit.    Allergies  Allergen Reactions  . Metoclopramide Swelling and Other (See Comments)    Dystonic reaction  . Ondansetron Nausea And Vomiting    ROS Review of Systems  Constitutional: Negative.   HENT: Negative.   Respiratory: Negative.   Cardiovascular: Negative.   Gastrointestinal: Negative.   Genitourinary: Negative.   Musculoskeletal: Positive for back pain and myalgias.  Skin: Negative for color change and rash.  Psychiatric/Behavioral: Negative for sleep disturbance and suicidal ideas. The patient is not nervous/anxious.       Objective:    Physical Exam Constitutional:      Appearance: Normal appearance.  HENT:     Head: Normocephalic.     Nose: Nose normal.  Cardiovascular:     Rate and Rhythm: Normal rate and regular rhythm.     Pulses: Normal pulses.     Heart sounds: Normal heart sounds.  Pulmonary:     Effort: Pulmonary effort is normal.     Breath sounds: Normal breath sounds.  Abdominal:     General: Bowel sounds are normal.  Musculoskeletal:        General: Tenderness present.  Skin:    General: Skin is warm.     Findings: No erythema or rash.  Neurological:     Mental Status: She is alert and oriented to person, place, and time.    Psychiatric:        Mood and Affect: Mood normal.        Behavior: Behavior normal.     BP 124/80   Pulse (!) 116   Temp 98.6 F (37 C)   Ht 5' (1.524 m)   Wt 114 lb 6.4 oz (51.9 kg)   LMP 06/13/2020   SpO2 100%   BMI 22.34 kg/m  Wt Readings from  Last 3 Encounters:  06/25/20 114 lb 6.4 oz (51.9 kg)  04/17/20 140 lb (63.5 kg)  03/04/20 130 lb (59 kg)     Health Maintenance Due  Topic Date Due  . PAP SMEAR-Modifier  03/10/2020    There are no preventive care reminders to display for this patient.  Lab Results  Component Value Date   TSH 0.934 12/15/2018   Lab Results  Component Value Date   WBC 8.6 12/26/2018   HGB 10.4 (L) 12/26/2018   HCT 34.8 (L) 12/26/2018   MCV 85.7 12/26/2018   PLT 657 (H) 12/26/2018   Lab Results  Component Value Date   NA 136 12/26/2018   K 3.4 (L) 12/26/2018   CO2 25 12/26/2018   GLUCOSE 94 12/26/2018   BUN 5 (L) 12/26/2018   CREATININE 0.46 12/26/2018   BILITOT 0.8 12/26/2018   ALKPHOS 106 12/26/2018   AST 18 12/26/2018   ALT 19 12/26/2018   PROT 6.4 (L) 12/26/2018   ALBUMIN 2.8 (L) 12/26/2018   CALCIUM 8.2 (L) 12/26/2018   ANIONGAP 10 12/26/2018   Lab Results  Component Value Date   CHOL 133 06/08/2018   Lab Results  Component Value Date   HDL 52 06/08/2018   Lab Results  Component Value Date   LDLCALC 73 06/08/2018   Lab Results  Component Value Date   TRIG 41 06/08/2018   Lab Results  Component Value Date   CHOLHDL 2.6 06/08/2018   Lab Results  Component Value Date   HGBA1C 4.9 06/08/2018      Assessment & Plan:   Problem List Items Addressed This Visit      Digestive   Chronic constipation    Well managed no new concerns.        Other   Low back pain - Primary    Patient is a 29 year old female who presents to clinic today for lower back pain.  This is not new for patient, patient was first diagnosed in 2017 and clinical cause remains unchanged.  Patient reports going to chiropractor,  orthopedic referral and using Tylenol with no therapeutic effect.  Patient is reporting a pain of 8 out of 10 from a pain scale of 0-10 right lower back with sciatica.  Patient reports pain keeps her up at night and she cannot find a comfortable position. Provided education to patient with printed handouts, back strengthening exercises, ice or heat whichever is comfortable.  Naproxen anti-inflammatory, 80 of Depo-Medrol given in clinic, prednisone taper, referral to orthopedic surgeon for second opinion. Rx sent to pharmacy Patient follow-up as needed with worsening or unresolved symptoms.      Relevant Medications   naproxen (NAPROSYN) 500 MG tablet   predniSONE (STERAPRED UNI-PAK 21 TAB) 10 MG (21) TBPK tablet   Other Relevant Orders   Ambulatory referral to Orthopedic Surgery      Meds ordered this encounter  Medications  . naproxen (NAPROSYN) 500 MG tablet    Sig: Take 1 tablet (500 mg total) by mouth 2 (two) times daily with a meal.    Dispense:  30 tablet    Refill:  0    Order Specific Question:   Supervising Provider    Answer:   Caryl Pina A [[id number]]  . predniSONE (STERAPRED UNI-PAK 21 TAB) 10 MG (21) TBPK tablet    Sig: 6 tablet by mouth day 1, 5 tablet day 2, 4 tablet day 3, 3 tablet day 4, 2 tablet day 5, 1tablet day 6  Dispense:  1 each    Refill:  0    Order Specific Question:   Supervising Provider    Answer:   Caryl Pina A [id number]  . methylPREDNISolone acetate (DEPO-MEDROL) injection 80 mg    Follow-up: Return if symptoms worsen or fail to improve.    Ivy Lynn, NP

## 2020-06-25 NOTE — Assessment & Plan Note (Signed)
Well managed no new concerns.

## 2020-06-25 NOTE — Patient Instructions (Addendum)
Low back pain Patient is a 29 year old female who presents to clinic today for lower back pain.  This is not new for patient, patient was first diagnosed in 2017 and clinical cause remains unchanged.  Patient reports going to chiropractor, orthopedic referral and using Tylenol with no therapeutic effect.  Patient is reporting a pain of 8 out of 10 from a pain scale of 0-10 right lower back with sciatica.  Patient reports pain keeps her up at night and she cannot find a comfortable position. Provided education to patient with printed handouts, back strengthening exercises, ice or heat whichever is comfortable.  Naproxen anti-inflammatory, 80 of Depo-Medrol given in clinic, prednisone taper, referral to orthopedic surgeon for second opinion. Rx sent to pharmacy Patient follow-up as needed with worsening or unresolved symptoms.  Chronic constipation Well managed no new concerns.    Acute Back Pain, Adult Acute back pain is sudden and usually short-lived. It is often caused by an injury to the muscles and tissues in the back. The injury may result from:  A muscle or ligament getting overstretched or torn (strained). Ligaments are tissues that connect bones to each other. Lifting something improperly can cause a back strain.  Wear and tear (degeneration) of the spinal disks. Spinal disks are circular tissue that provides cushioning between the bones of the spine (vertebrae).  Twisting motions, such as while playing sports or doing yard work.  A hit to the back.  Arthritis. You may have a physical exam, lab tests, and imaging tests to find the cause of your pain. Acute back pain usually goes away with rest and home care. Follow these instructions at home: Managing pain, stiffness, and swelling  Take over-the-counter and prescription medicines only as told by your health care provider.  Your health care provider may recommend applying ice during the first 24-48 hours after your pain starts. To  do this: ? Put ice in a plastic bag. ? Place a towel between your skin and the bag. ? Leave the ice on for 20 minutes, 2-3 times a day.  If directed, apply heat to the affected area as often as told by your health care provider. Use the heat source that your health care provider recommends, such as a moist heat pack or a heating pad. ? Place a towel between your skin and the heat source. ? Leave the heat on for 20-30 minutes. ? Remove the heat if your skin turns bright red. This is especially important if you are unable to feel pain, heat, or cold. You have a greater risk of getting burned. Activity   Do not stay in bed. Staying in bed for more than 1-2 days can delay your recovery.  Sit up and stand up straight. Avoid leaning forward when you sit, or hunching over when you stand. ? If you work at a desk, sit close to it so you do not need to lean over. Keep your chin tucked in. Keep your neck drawn back, and keep your elbows bent at a right angle. Your arms should look like the letter "L." ? Sit high and close to the steering wheel when you drive. Add lower back (lumbar) support to your car seat, if needed.  Take short walks on even surfaces as soon as you are able. Try to increase the length of time you walk each day.  Do not sit, drive, or stand in one place for more than 30 minutes at a time. Sitting or standing for long periods of time  can put stress on your back.  Do not drive or use heavy machinery while taking prescription pain medicine.  Use proper lifting techniques. When you bend and lift, use positions that put less stress on your back: ? [place] your knees. ? Keep the load close to your body. ? Avoid twisting.  Exercise regularly as told by your health care provider. Exercising helps your back heal faster and helps prevent back injuries by keeping muscles strong and flexible.  Work with a physical therapist to make a safe exercise program, as recommended by your health care  provider. Do any exercises as told by your physical therapist. Lifestyle  Maintain a healthy weight. Extra weight puts stress on your back and makes it difficult to have good posture.  Avoid activities or situations that make you feel anxious or stressed. Stress and anxiety increase muscle tension and can make back pain worse. Learn ways to manage anxiety and stress, such as through exercise. General instructions  Sleep on a firm mattress in a comfortable position. Try lying on your side with your knees slightly bent. If you lie on your back, put a pillow under your knees.  Follow your treatment plan as told by your health care provider. This may include: ? Cognitive or behavioral therapy. ? Acupuncture or massage therapy. ? Meditation or yoga. Contact a health care provider if:  You have pain that is not relieved with rest or medicine.  You have increasing pain going down into your legs or buttocks.  Your pain does not improve after 2 weeks.  You have pain at night.  You lose weight without trying.  You have a fever or chills. Get help right away if:  You develop new bowel or bladder control problems.  You have unusual weakness or numbness in your arms or legs.  You develop nausea or vomiting.  You develop abdominal pain.  You feel faint. Summary  Acute back pain is sudden and usually short-lived.  Use proper lifting techniques. When you bend and lift, use positions that put less stress on your back.  Take over-the-counter and prescription medicines and apply heat or ice as directed by your health care provider. This information is not intended to replace advice given to you by your health care provider. Make sure you discuss any questions you have with your health care provider. Document Revised: 03/21/2019 Document Reviewed: 07/14/2017 Elsevier Patient Education  [place].

## 2020-06-25 NOTE — Assessment & Plan Note (Signed)
Patient is a 29 year old female who presents to clinic today for lower back pain.  This is not new for patient, patient was first diagnosed in 2017 and clinical cause remains unchanged.  Patient reports going to chiropractor, orthopedic referral and using Tylenol with no therapeutic effect.  Patient is reporting a pain of 8 out of 10 from a pain scale of 0-10 right lower back with sciatica.  Patient reports pain keeps her up at night and she cannot find a comfortable position. Provided education to patient with printed handouts, back strengthening exercises, ice or heat whichever is comfortable.  Naproxen anti-inflammatory, 80 of Depo-Medrol given in clinic, prednisone taper, referral to orthopedic surgeon for second opinion. Rx sent to pharmacy Patient follow-up as needed with worsening or unresolved symptoms.

## 2020-06-28 ENCOUNTER — Emergency Department (HOSPITAL_COMMUNITY): Payer: No Typology Code available for payment source

## 2020-06-28 ENCOUNTER — Inpatient Hospital Stay (HOSPITAL_COMMUNITY)
Admission: EM | Admit: 2020-06-28 | Discharge: 2020-07-08 | DRG: 957 | Disposition: A | Discharge status: Home / Self Care | Payer: No Typology Code available for payment source | Attending: Surgery | Admitting: Surgery

## 2020-06-28 ENCOUNTER — Encounter (HOSPITAL_COMMUNITY): Payer: Self-pay | Admitting: Emergency Medicine

## 2020-06-28 DIAGNOSIS — E559 Vitamin D deficiency, unspecified: Secondary | ICD-10-CM | POA: Diagnosis present

## 2020-06-28 DIAGNOSIS — F1290 Cannabis use, unspecified, uncomplicated: Secondary | ICD-10-CM | POA: Diagnosis present

## 2020-06-28 DIAGNOSIS — S32009A Unspecified fracture of unspecified lumbar vertebra, initial encounter for closed fracture: Secondary | ICD-10-CM

## 2020-06-28 DIAGNOSIS — Z825 Family history of asthma and other chronic lower respiratory diseases: Secondary | ICD-10-CM

## 2020-06-28 DIAGNOSIS — S32810A Multiple fractures of pelvis with stable disruption of pelvic ring, initial encounter for closed fracture: Secondary | ICD-10-CM

## 2020-06-28 DIAGNOSIS — S32302A Unspecified fracture of left ilium, initial encounter for closed fracture: Secondary | ICD-10-CM

## 2020-06-28 DIAGNOSIS — Z9884 Bariatric surgery status: Secondary | ICD-10-CM

## 2020-06-28 DIAGNOSIS — S3282XA Multiple fractures of pelvis without disruption of pelvic ring, initial encounter for closed fracture: Secondary | ICD-10-CM | POA: Diagnosis present

## 2020-06-28 DIAGNOSIS — S36898A Other injury of other intra-abdominal organs, initial encounter: Secondary | ICD-10-CM | POA: Diagnosis present

## 2020-06-28 DIAGNOSIS — S52509A Unspecified fracture of the lower end of unspecified radius, initial encounter for closed fracture: Secondary | ICD-10-CM

## 2020-06-28 DIAGNOSIS — S42134A Nondisplaced fracture of coracoid process, right shoulder, initial encounter for closed fracture: Secondary | ICD-10-CM

## 2020-06-28 DIAGNOSIS — D62 Acute posthemorrhagic anemia: Secondary | ICD-10-CM | POA: Diagnosis present

## 2020-06-28 DIAGNOSIS — G935 Compression of brain: Secondary | ICD-10-CM | POA: Diagnosis present

## 2020-06-28 DIAGNOSIS — Z800 Family history of malignant neoplasm of digestive organs: Secondary | ICD-10-CM

## 2020-06-28 DIAGNOSIS — R0789 Other chest pain: Secondary | ICD-10-CM | POA: Diagnosis not present

## 2020-06-28 DIAGNOSIS — S3210XA Unspecified fracture of sacrum, initial encounter for closed fracture: Secondary | ICD-10-CM

## 2020-06-28 DIAGNOSIS — S32059A Unspecified fracture of fifth lumbar vertebra, initial encounter for closed fracture: Secondary | ICD-10-CM | POA: Diagnosis present

## 2020-06-28 DIAGNOSIS — S32811A Multiple fractures of pelvis with unstable disruption of pelvic ring, initial encounter for closed fracture: Secondary | ICD-10-CM | POA: Diagnosis not present

## 2020-06-28 DIAGNOSIS — Z888 Allergy status to other drugs, medicaments and biological substances status: Secondary | ICD-10-CM

## 2020-06-28 DIAGNOSIS — Z20822 Contact with and (suspected) exposure to covid-19: Secondary | ICD-10-CM | POA: Diagnosis present

## 2020-06-28 DIAGNOSIS — Z9049 Acquired absence of other specified parts of digestive tract: Secondary | ICD-10-CM

## 2020-06-28 DIAGNOSIS — S52612A Displaced fracture of left ulna styloid process, initial encounter for closed fracture: Secondary | ICD-10-CM | POA: Diagnosis present

## 2020-06-28 DIAGNOSIS — R339 Retention of urine, unspecified: Secondary | ICD-10-CM | POA: Diagnosis present

## 2020-06-28 DIAGNOSIS — M79672 Pain in left foot: Secondary | ICD-10-CM

## 2020-06-28 DIAGNOSIS — Y92410 Unspecified street and highway as the place of occurrence of the external cause: Secondary | ICD-10-CM

## 2020-06-28 DIAGNOSIS — M25551 Pain in right hip: Secondary | ICD-10-CM

## 2020-06-28 DIAGNOSIS — Z419 Encounter for procedure for purposes other than remedying health state, unspecified: Secondary | ICD-10-CM

## 2020-06-28 DIAGNOSIS — F419 Anxiety disorder, unspecified: Secondary | ICD-10-CM | POA: Diagnosis present

## 2020-06-28 DIAGNOSIS — S42131A Displaced fracture of coracoid process, right shoulder, initial encounter for closed fracture: Secondary | ICD-10-CM | POA: Diagnosis present

## 2020-06-28 DIAGNOSIS — S42136A Nondisplaced fracture of coracoid process, unspecified shoulder, initial encounter for closed fracture: Secondary | ICD-10-CM

## 2020-06-28 DIAGNOSIS — F410 Panic disorder [episodic paroxysmal anxiety] without agoraphobia: Secondary | ICD-10-CM | POA: Diagnosis present

## 2020-06-28 DIAGNOSIS — S3216XA Type 3 fracture of sacrum, initial encounter for closed fracture: Secondary | ICD-10-CM | POA: Diagnosis present

## 2020-06-28 DIAGNOSIS — R52 Pain, unspecified: Secondary | ICD-10-CM

## 2020-06-28 DIAGNOSIS — Z886 Allergy status to analgesic agent status: Secondary | ICD-10-CM

## 2020-06-28 DIAGNOSIS — J45909 Unspecified asthma, uncomplicated: Secondary | ICD-10-CM | POA: Diagnosis present

## 2020-06-28 DIAGNOSIS — S52502A Unspecified fracture of the lower end of left radius, initial encounter for closed fracture: Secondary | ICD-10-CM | POA: Diagnosis present

## 2020-06-28 DIAGNOSIS — S32049A Unspecified fracture of fourth lumbar vertebra, initial encounter for closed fracture: Secondary | ICD-10-CM | POA: Diagnosis present

## 2020-06-28 LAB — I-STAT CHEM 8, ED
BUN: 17 mg/dL (ref 6–20)
Calcium, Ion: 1.08 mmol/L — ABNORMAL LOW (ref 1.15–1.40)
Chloride: 106 mmol/L (ref 98–111)
Creatinine, Ser: 0.6 mg/dL (ref 0.44–1.00)
Glucose, Bld: 120 mg/dL — ABNORMAL HIGH (ref 70–99)
HCT: 35 % — ABNORMAL LOW (ref 36.0–46.0)
Hemoglobin: 11.9 g/dL — ABNORMAL LOW (ref 12.0–15.0)
Potassium: 3.4 mmol/L — ABNORMAL LOW (ref 3.5–5.1)
Sodium: 139 mmol/L (ref 135–145)
TCO2: 20 mmol/L — ABNORMAL LOW (ref 22–32)

## 2020-06-28 LAB — CBC WITH DIFFERENTIAL/PLATELET
Abs Immature Granulocytes: 0.87 10*3/uL — ABNORMAL HIGH (ref 0.00–0.07)
Basophils Absolute: 0.1 10*3/uL (ref 0.0–0.1)
Basophils Relative: 1 %
Eosinophils Absolute: 0.1 10*3/uL (ref 0.0–0.5)
Eosinophils Relative: 0 %
HCT: 35.4 % — ABNORMAL LOW (ref 36.0–46.0)
Hemoglobin: 11.7 g/dL — ABNORMAL LOW (ref 12.0–15.0)
Immature Granulocytes: 3 %
Lymphocytes Relative: 7 %
Lymphs Abs: 1.8 10*3/uL (ref 0.7–4.0)
MCH: 28.3 pg (ref 26.0–34.0)
MCHC: 33.1 g/dL (ref 30.0–36.0)
MCV: 85.5 fL (ref 80.0–100.0)
Monocytes Absolute: 2 10*3/uL — ABNORMAL HIGH (ref 0.1–1.0)
Monocytes Relative: 8 %
Neutro Abs: 20.8 10*3/uL — ABNORMAL HIGH (ref 1.7–7.7)
Neutrophils Relative %: 81 %
Platelets: 426 10*3/uL — ABNORMAL HIGH (ref 150–400)
RBC: 4.14 MIL/uL (ref 3.87–5.11)
RDW: 13.3 % (ref 11.5–15.5)
WBC: 25.7 10*3/uL — ABNORMAL HIGH (ref 4.0–10.5)
nRBC: 0 % (ref 0.0–0.2)

## 2020-06-28 LAB — BASIC METABOLIC PANEL
Anion gap: 13 (ref 5–15)
BUN: 16 mg/dL (ref 6–20)
CO2: 17 mmol/L — ABNORMAL LOW (ref 22–32)
Calcium: 8.6 mg/dL — ABNORMAL LOW (ref 8.9–10.3)
Chloride: 107 mmol/L (ref 98–111)
Creatinine, Ser: 0.7 mg/dL (ref 0.44–1.00)
GFR calc Af Amer: >60 mL/min (ref >60)
GFR calc non Af Amer: >60 mL/min (ref >60)
Glucose, Bld: 119 mg/dL — ABNORMAL HIGH (ref 70–99)
Potassium: 3.5 mmol/L (ref 3.5–5.1)
Sodium: 137 mmol/L (ref 135–145)

## 2020-06-28 LAB — TYPE AND SCREEN
ABO/RH(D): A POS
Antibody Screen: NEGATIVE

## 2020-06-28 LAB — SARS CORONAVIRUS 2 BY RT PCR (HOSPITAL ORDER, PERFORMED IN ~~LOC~~ HOSPITAL LAB): SARS Coronavirus 2: NEGATIVE

## 2020-06-28 LAB — I-STAT BETA HCG BLOOD, ED (MC, WL, AP ONLY): I-stat hCG, quantitative: <5 m[IU]/mL (ref <5)

## 2020-06-28 MED ORDER — IOHEXOL 300 MG/ML  SOLN
100.0000 mL | Freq: Once | INTRAMUSCULAR | Status: AC | PRN
  Administered 2020-06-28: 100 mL via INTRAVENOUS

## 2020-06-28 MED ORDER — MORPHINE SULFATE (PF) 4 MG/ML IV SOLN
4.0000 mg | Freq: Once | INTRAVENOUS | Status: AC
  Administered 2020-06-28: 4 mg via INTRAVENOUS
  Filled 2020-06-28: qty 1

## 2020-06-28 MED ORDER — HYDROMORPHONE HCL 1 MG/ML IJ SOLN
1.0000 mg | INTRAMUSCULAR | Status: DC | PRN
  Administered 2020-06-28 – 2020-06-29 (×2): 1 mg via INTRAVENOUS
  Filled 2020-06-28 (×2): qty 1

## 2020-06-28 MED ORDER — HYDROMORPHONE HCL 1 MG/ML IJ SOLN
0.5000 mg | Freq: Once | INTRAMUSCULAR | Status: AC
  Administered 2020-06-28: 0.5 mg via INTRAVENOUS
  Filled 2020-06-28: qty 1

## 2020-06-28 MED ORDER — HYDROMORPHONE HCL 1 MG/ML IJ SOLN
1.0000 mg | Freq: Once | INTRAMUSCULAR | Status: AC
  Administered 2020-06-28: 1 mg via INTRAVENOUS
  Filled 2020-06-28: qty 1

## 2020-06-28 NOTE — ED Notes (Signed)
Portable xrays being done at present
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
## 2020-06-28 NOTE — ED Notes (Signed)
The mother has been called she reports that someone will be here shortly  Mothers number [phone]

## 2020-06-28 NOTE — ED Notes (Signed)
Dr Percell Miller here to see the pt
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
## 2020-06-28 NOTE — ED Notes (Signed)
Pt waiting on doctors  Pain med given

## 2020-06-29 ENCOUNTER — Inpatient Hospital Stay (HOSPITAL_COMMUNITY): Payer: No Typology Code available for payment source

## 2020-06-29 DIAGNOSIS — S32811A Multiple fractures of pelvis with unstable disruption of pelvic ring, initial encounter for closed fracture: Secondary | ICD-10-CM | POA: Diagnosis present

## 2020-06-29 DIAGNOSIS — Z9049 Acquired absence of other specified parts of digestive tract: Secondary | ICD-10-CM | POA: Diagnosis not present

## 2020-06-29 DIAGNOSIS — Z20822 Contact with and (suspected) exposure to covid-19: Secondary | ICD-10-CM | POA: Diagnosis present

## 2020-06-29 DIAGNOSIS — F419 Anxiety disorder, unspecified: Secondary | ICD-10-CM | POA: Diagnosis present

## 2020-06-29 DIAGNOSIS — S42131A Displaced fracture of coracoid process, right shoulder, initial encounter for closed fracture: Secondary | ICD-10-CM | POA: Diagnosis present

## 2020-06-29 DIAGNOSIS — S3282XA Multiple fractures of pelvis without disruption of pelvic ring, initial encounter for closed fracture: Secondary | ICD-10-CM | POA: Diagnosis present

## 2020-06-29 DIAGNOSIS — R339 Retention of urine, unspecified: Secondary | ICD-10-CM | POA: Diagnosis present

## 2020-06-29 DIAGNOSIS — Z9884 Bariatric surgery status: Secondary | ICD-10-CM | POA: Diagnosis not present

## 2020-06-29 DIAGNOSIS — S52502A Unspecified fracture of the lower end of left radius, initial encounter for closed fracture: Secondary | ICD-10-CM | POA: Diagnosis present

## 2020-06-29 DIAGNOSIS — S52612A Displaced fracture of left ulna styloid process, initial encounter for closed fracture: Secondary | ICD-10-CM | POA: Diagnosis present

## 2020-06-29 DIAGNOSIS — S36898A Other injury of other intra-abdominal organs, initial encounter: Secondary | ICD-10-CM | POA: Diagnosis present

## 2020-06-29 DIAGNOSIS — S3216XA Type 3 fracture of sacrum, initial encounter for closed fracture: Secondary | ICD-10-CM | POA: Diagnosis present

## 2020-06-29 DIAGNOSIS — R0789 Other chest pain: Secondary | ICD-10-CM | POA: Diagnosis not present

## 2020-06-29 DIAGNOSIS — Z888 Allergy status to other drugs, medicaments and biological substances status: Secondary | ICD-10-CM | POA: Diagnosis not present

## 2020-06-29 DIAGNOSIS — Y92410 Unspecified street and highway as the place of occurrence of the external cause: Secondary | ICD-10-CM | POA: Diagnosis not present

## 2020-06-29 DIAGNOSIS — E559 Vitamin D deficiency, unspecified: Secondary | ICD-10-CM | POA: Diagnosis present

## 2020-06-29 DIAGNOSIS — D62 Acute posthemorrhagic anemia: Secondary | ICD-10-CM | POA: Diagnosis present

## 2020-06-29 DIAGNOSIS — F410 Panic disorder [episodic paroxysmal anxiety] without agoraphobia: Secondary | ICD-10-CM | POA: Diagnosis present

## 2020-06-29 DIAGNOSIS — S32049A Unspecified fracture of fourth lumbar vertebra, initial encounter for closed fracture: Secondary | ICD-10-CM | POA: Diagnosis present

## 2020-06-29 DIAGNOSIS — Z800 Family history of malignant neoplasm of digestive organs: Secondary | ICD-10-CM | POA: Diagnosis not present

## 2020-06-29 DIAGNOSIS — F1290 Cannabis use, unspecified, uncomplicated: Secondary | ICD-10-CM | POA: Diagnosis present

## 2020-06-29 DIAGNOSIS — M79672 Pain in left foot: Secondary | ICD-10-CM | POA: Diagnosis not present

## 2020-06-29 DIAGNOSIS — S32059A Unspecified fracture of fifth lumbar vertebra, initial encounter for closed fracture: Secondary | ICD-10-CM | POA: Diagnosis present

## 2020-06-29 DIAGNOSIS — G935 Compression of brain: Secondary | ICD-10-CM | POA: Diagnosis present

## 2020-06-29 DIAGNOSIS — Z886 Allergy status to analgesic agent status: Secondary | ICD-10-CM | POA: Diagnosis not present

## 2020-06-29 DIAGNOSIS — Z825 Family history of asthma and other chronic lower respiratory diseases: Secondary | ICD-10-CM | POA: Diagnosis not present

## 2020-06-29 LAB — CBC
HCT: 32.4 % — ABNORMAL LOW (ref 36.0–46.0)
Hemoglobin: 10.4 g/dL — ABNORMAL LOW (ref 12.0–15.0)
MCH: 28.6 pg (ref 26.0–34.0)
MCHC: 32.1 g/dL (ref 30.0–36.0)
MCV: 89 fL (ref 80.0–100.0)
Platelets: 285 10*3/uL (ref 150–400)
RBC: 3.64 MIL/uL — ABNORMAL LOW (ref 3.87–5.11)
RDW: 13.6 % (ref 11.5–15.5)
WBC: 9.4 10*3/uL (ref 4.0–10.5)
nRBC: 0 % (ref 0.0–0.2)

## 2020-06-29 LAB — COMPREHENSIVE METABOLIC PANEL
ALT: 24 U/L (ref 0–44)
AST: 36 U/L (ref 15–41)
Albumin: 3.2 g/dL — ABNORMAL LOW (ref 3.5–5.0)
Alkaline Phosphatase: 69 U/L (ref 38–126)
Anion gap: 10 (ref 5–15)
BUN: 11 mg/dL (ref 6–20)
CO2: 21 mmol/L — ABNORMAL LOW (ref 22–32)
Calcium: 8.1 mg/dL — ABNORMAL LOW (ref 8.9–10.3)
Chloride: 109 mmol/L (ref 98–111)
Creatinine, Ser: 0.57 mg/dL (ref 0.44–1.00)
GFR calc Af Amer: >60 mL/min (ref >60)
GFR calc non Af Amer: >60 mL/min (ref >60)
Glucose, Bld: 101 mg/dL — ABNORMAL HIGH (ref 70–99)
Potassium: 3.7 mmol/L (ref 3.5–5.1)
Sodium: 140 mmol/L (ref 135–145)
Total Bilirubin: 1.5 mg/dL — ABNORMAL HIGH (ref 0.3–1.2)
Total Protein: 5.9 g/dL — ABNORMAL LOW (ref 6.5–8.1)

## 2020-06-29 LAB — HIV ANTIBODY (ROUTINE TESTING W REFLEX): HIV Screen 4th Generation wRfx: NONREACTIVE

## 2020-06-29 MED ORDER — PROCHLORPERAZINE EDISYLATE 10 MG/2ML IJ SOLN: 5.0000 mg | Freq: Four times a day (QID) | INTRAMUSCULAR | Status: DC | PRN

## 2020-06-29 MED ORDER — PANTOPRAZOLE SODIUM 40 MG PO TBEC
40.0000 mg | DELAYED_RELEASE_TABLET | Freq: Every day | ORAL | Status: DC
  Administered 2020-06-30 – 2020-07-08 (×7): 40 mg via ORAL
  Filled 2020-06-29 (×10): qty 1

## 2020-06-29 MED ORDER — PANTOPRAZOLE SODIUM 40 MG IV SOLR
40.0000 mg | Freq: Every day | INTRAVENOUS | Status: DC
  Filled 2020-06-29 (×2): qty 40

## 2020-06-29 MED ORDER — OXYCODONE HCL 5 MG PO TABS: 5.0000 mg | ORAL_TABLET | ORAL | Status: DC | PRN

## 2020-06-29 MED ORDER — OXYCODONE HCL 5 MG PO TABS
10.0000 mg | ORAL_TABLET | ORAL | Status: DC | PRN
  Administered 2020-06-29: 10 mg via ORAL
  Filled 2020-06-29: qty 2

## 2020-06-29 MED ORDER — ENOXAPARIN SODIUM 30 MG/0.3ML ~~LOC~~ SOLN
30.0000 mg | Freq: Two times a day (BID) | SUBCUTANEOUS | Status: DC
  Administered 2020-06-29 – 2020-07-03 (×7): 30 mg via SUBCUTANEOUS
  Filled 2020-06-29 (×7): qty 0.3

## 2020-06-29 MED ORDER — PROCHLORPERAZINE MALEATE 10 MG PO TABS
10.0000 mg | ORAL_TABLET | Freq: Four times a day (QID) | ORAL | Status: DC | PRN
  Administered 2020-07-06: 10 mg via ORAL
  Filled 2020-06-29 (×3): qty 1

## 2020-06-29 MED ORDER — HYDROMORPHONE HCL 1 MG/ML IJ SOLN
1.0000 mg | INTRAMUSCULAR | Status: DC | PRN
  Administered 2020-06-29 – 2020-07-02 (×12): 1 mg via INTRAVENOUS
  Filled 2020-06-29 (×13): qty 1

## 2020-06-29 MED ORDER — METHOCARBAMOL 1000 MG/10ML IJ SOLN
500.0000 mg | Freq: Three times a day (TID) | INTRAVENOUS | Status: DC
  Administered 2020-06-29: 500 mg via INTRAVENOUS
  Filled 2020-06-29 (×3): qty 5

## 2020-06-29 MED ORDER — METHOCARBAMOL 1000 MG/10ML IJ SOLN
1000.0000 mg | Freq: Three times a day (TID) | INTRAVENOUS | Status: DC
  Administered 2020-06-29 – 2020-07-03 (×11): 1000 mg via INTRAVENOUS
  Filled 2020-06-29 (×15): qty 10

## 2020-06-29 MED ORDER — MORPHINE SULFATE (PF) 2 MG/ML IV SOLN
1.0000 mg | INTRAVENOUS | Status: DC | PRN
  Administered 2020-06-29 (×3): 2 mg via INTRAVENOUS
  Filled 2020-06-29 (×3): qty 1

## 2020-06-29 MED ORDER — BISACODYL 10 MG RE SUPP: 10.0000 mg | Freq: Every day | RECTAL | Status: DC | PRN

## 2020-06-29 MED ORDER — POTASSIUM CHLORIDE IN NACL 20-0.9 MEQ/L-% IV SOLN
INTRAVENOUS | Status: DC
  Filled 2020-06-29 (×3): qty 1000

## 2020-06-29 MED ORDER — CHLORHEXIDINE GLUCONATE CLOTH 2 % EX PADS
6.0000 | MEDICATED_PAD | Freq: Every day | CUTANEOUS | Status: DC
  Administered 2020-06-29 – 2020-07-08 (×8): 6 via TOPICAL

## 2020-06-29 MED ORDER — TAB-A-VITE/IRON PO TABS
1.0000 | ORAL_TABLET | Freq: Every day | ORAL | Status: DC
  Administered 2020-06-30 – 2020-07-08 (×9): 1 via ORAL
  Filled 2020-06-29 (×10): qty 1

## 2020-06-29 MED ORDER — DOCUSATE SODIUM 100 MG PO CAPS
100.0000 mg | ORAL_CAPSULE | Freq: Two times a day (BID) | ORAL | Status: DC
  Administered 2020-06-29 – 2020-07-08 (×15): 100 mg via ORAL
  Filled 2020-06-29 (×18): qty 1

## 2020-06-29 MED ORDER — ACETAMINOPHEN 500 MG PO TABS
1000.0000 mg | ORAL_TABLET | Freq: Three times a day (TID) | ORAL | Status: DC
  Administered 2020-06-29 – 2020-07-08 (×24): 1000 mg via ORAL
  Filled 2020-06-29 (×28): qty 2

## 2020-06-29 NOTE — ED Notes (Signed)
Report given to rn on 5n 

## 2020-06-29 NOTE — ED Notes (Signed)
Dr Ok Anis at  The bedside

## 2020-06-29 NOTE — ED Notes (Signed)
Unsuccessful attempt to call report wants a call back
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
## 2020-06-29 NOTE — Progress Notes (Signed)
Orthopedic Tech Progress Note Patient Details:  Brenda Spencer 14-Sep-1991 [id number]  Ortho Devices Type of Ortho Device: Arm sling Ortho Device/Splint Location: Right Arm/Shoulder Ortho Device/Splint Interventions: Adjustment, Application   Post Interventions Patient Tolerated: Well   Tuff Clabo E [name] 06/29/2020, 4:05 AM

## 2020-06-30 ENCOUNTER — Inpatient Hospital Stay (HOSPITAL_COMMUNITY): Payer: No Typology Code available for payment source

## 2020-06-30 LAB — CBC
HCT: 30 % — ABNORMAL LOW (ref 36.0–46.0)
Hemoglobin: 9.3 g/dL — ABNORMAL LOW (ref 12.0–15.0)
MCH: 27.5 pg (ref 26.0–34.0)
MCHC: 31 g/dL (ref 30.0–36.0)
MCV: 88.8 fL (ref 80.0–100.0)
Platelets: 218 10*3/uL (ref 150–400)
RBC: 3.38 MIL/uL — ABNORMAL LOW (ref 3.87–5.11)
RDW: 13.4 % (ref 11.5–15.5)
WBC: 7.8 10*3/uL (ref 4.0–10.5)
nRBC: 0 % (ref 0.0–0.2)

## 2020-06-30 MED ORDER — TRAMADOL HCL 50 MG PO TABS
50.0000 mg | ORAL_TABLET | Freq: Four times a day (QID) | ORAL | Status: DC
  Administered 2020-06-30 – 2020-07-01 (×4): 50 mg via ORAL
  Filled 2020-06-30 (×4): qty 1

## 2020-06-30 MED ORDER — OXYCODONE HCL 5 MG PO TABS
10.0000 mg | ORAL_TABLET | ORAL | Status: DC | PRN
  Administered 2020-06-30 – 2020-07-08 (×29): 15 mg via ORAL
  Filled 2020-06-30 (×30): qty 3

## 2020-06-30 MED ORDER — BETHANECHOL CHLORIDE 5 MG PO TABS
5.0000 mg | ORAL_TABLET | Freq: Three times a day (TID) | ORAL | Status: DC
  Administered 2020-06-30 – 2020-07-02 (×4): 5 mg via ORAL
  Filled 2020-06-30 (×10): qty 1

## 2020-06-30 NOTE — Plan of Care (Signed)
  Problem: Education: Goal: Knowledge of General Education information will improve Description: Including pain rating scale, medication(s)/side effects and non-pharmacologic comfort measures Outcome: Progressing   Problem: Pain Managment: Goal: General experience of comfort will improve Outcome: Progressing   Problem: Safety: Goal: Ability to remain free from injury will improve Outcome: Progressing   
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
## 2020-06-30 NOTE — Progress Notes (Signed)
Orthopedic Tech Progress Note Patient Details:  Brenda Spencer [birth year] [id number] Tried to apply Velcro wrist again patient refused. Patient ID: Brenda Spencer, female   DOB: 30-Aug-1991, 30 y.o.   MRN: [id number]   Brenda Spencer 06/30/2020, 4:12 PM

## 2020-06-30 NOTE — Progress Notes (Signed)
Orthopedic Tech Progress Note Patient Details:  Brenda Spencer [birth year] [id number] Went to apply Velcro wrist splint on patient. Patient refused and ask for ortho to come back later. Patient ID: Brenda Spencer, female   DOB: 1991-04-20, 29 y.o.   MRN: [id number]   Brenda Spencer 06/30/2020, 12:48 PM

## 2020-07-01 ENCOUNTER — Inpatient Hospital Stay (HOSPITAL_COMMUNITY): Payer: No Typology Code available for payment source

## 2020-07-01 ENCOUNTER — Inpatient Hospital Stay (HOSPITAL_COMMUNITY): Payer: No Typology Code available for payment source | Admitting: Certified Registered"

## 2020-07-01 ENCOUNTER — Other Ambulatory Visit: Payer: Self-pay

## 2020-07-01 ENCOUNTER — Encounter (HOSPITAL_COMMUNITY): Payer: Self-pay

## 2020-07-01 ENCOUNTER — Encounter (HOSPITAL_COMMUNITY): Admission: EM | Disposition: A | Discharge status: Home / Self Care | Payer: Self-pay | Source: Home / Self Care

## 2020-07-01 HISTORY — PX: SACRO-ILIAC PINNING: SHX5050

## 2020-07-01 HISTORY — PX: OPEN REDUCTION INTERNAL FIXATION (ORIF) DISTAL RADIAL FRACTURE: SHX5989

## 2020-07-01 LAB — CBC
HCT: 29 % — ABNORMAL LOW (ref 36.0–46.0)
Hemoglobin: 9.2 g/dL — ABNORMAL LOW (ref 12.0–15.0)
MCH: 27.8 pg (ref 26.0–34.0)
MCHC: 31.7 g/dL (ref 30.0–36.0)
MCV: 87.6 fL (ref 80.0–100.0)
Platelets: 260 10*3/uL (ref 150–400)
RBC: 3.31 MIL/uL — ABNORMAL LOW (ref 3.87–5.11)
RDW: 13.2 % (ref 11.5–15.5)
WBC: 9.3 10*3/uL (ref 4.0–10.5)
nRBC: 0 % (ref 0.0–0.2)

## 2020-07-01 SURGERY — PINNING, SACROILIAC JOINT, PERCUTANEOUS
Anesthesia: General | Site: Wrist | Laterality: Left

## 2020-07-01 MED ORDER — DIPHENHYDRAMINE HCL 50 MG/ML IJ SOLN
INTRAMUSCULAR | Status: DC | PRN
  Administered 2020-07-01: 12.5 mg via INTRAVENOUS

## 2020-07-01 MED ORDER — CHLORHEXIDINE GLUCONATE 0.12 % MT SOLN
OROMUCOSAL | Status: AC
  Filled 2020-07-01: qty 15

## 2020-07-01 MED ORDER — CHLORHEXIDINE GLUCONATE 0.12 % MT SOLN: 15.0000 mL | Freq: Once | OROMUCOSAL | Status: DC

## 2020-07-01 MED ORDER — GABAPENTIN 300 MG PO CAPS
300.0000 mg | ORAL_CAPSULE | Freq: Three times a day (TID) | ORAL | Status: DC
  Administered 2020-07-01 – 2020-07-03 (×7): 300 mg via ORAL
  Filled 2020-07-01 (×7): qty 1

## 2020-07-01 MED ORDER — ROCURONIUM BROMIDE 10 MG/ML (PF) SYRINGE
PREFILLED_SYRINGE | INTRAVENOUS | Status: DC | PRN
  Administered 2020-07-01: 60 mg via INTRAVENOUS

## 2020-07-01 MED ORDER — MIDAZOLAM HCL 2 MG/2ML IJ SOLN
INTRAMUSCULAR | Status: AC
  Filled 2020-07-01: qty 2

## 2020-07-01 MED ORDER — SUGAMMADEX SODIUM 200 MG/2ML IV SOLN
INTRAVENOUS | Status: DC | PRN
  Administered 2020-07-01: 110 mg via INTRAVENOUS

## 2020-07-01 MED ORDER — HYDROMORPHONE HCL 1 MG/ML IJ SOLN
0.5000 mg | Freq: Once | INTRAMUSCULAR | Status: AC | PRN
  Administered 2020-07-01: 1 mg via INTRAVENOUS
  Filled 2020-07-01: qty 1

## 2020-07-01 MED ORDER — PROMETHAZINE HCL 25 MG/ML IJ SOLN: 6.2500 mg | INTRAMUSCULAR | Status: DC | PRN

## 2020-07-01 MED ORDER — TRAMADOL HCL 50 MG PO TABS
100.0000 mg | ORAL_TABLET | Freq: Four times a day (QID) | ORAL | Status: DC
  Administered 2020-07-01 – 2020-07-08 (×28): 100 mg via ORAL
  Filled 2020-07-01 (×29): qty 2

## 2020-07-01 MED ORDER — HYDROMORPHONE HCL 1 MG/ML IJ SOLN
0.2500 mg | INTRAMUSCULAR | Status: DC | PRN
  Administered 2020-07-01: 0.5 mg via INTRAVENOUS

## 2020-07-01 MED ORDER — FENTANYL CITRATE (PF) 250 MCG/5ML IJ SOLN
INTRAMUSCULAR | Status: AC
  Filled 2020-07-01: qty 5

## 2020-07-01 MED ORDER — 0.9 % SODIUM CHLORIDE (POUR BTL) OPTIME
TOPICAL | Status: DC | PRN
  Administered 2020-07-01: 1000 mL

## 2020-07-01 MED ORDER — CEFAZOLIN SODIUM-DEXTROSE 2-4 GM/100ML-% IV SOLN
2.0000 g | INTRAVENOUS | Status: DC
  Filled 2020-07-01: qty 100

## 2020-07-01 MED ORDER — FENTANYL CITRATE (PF) 250 MCG/5ML IJ SOLN
INTRAMUSCULAR | Status: DC | PRN
  Administered 2020-07-01: 100 ug via INTRAVENOUS
  Administered 2020-07-01: 50 ug via INTRAVENOUS
  Administered 2020-07-01: 100 ug via INTRAVENOUS

## 2020-07-01 MED ORDER — PROPOFOL 10 MG/ML IV BOLUS
INTRAVENOUS | Status: DC | PRN
  Administered 2020-07-01: 130 mg via INTRAVENOUS

## 2020-07-01 MED ORDER — DEXAMETHASONE SODIUM PHOSPHATE 10 MG/ML IJ SOLN
INTRAMUSCULAR | Status: AC
  Filled 2020-07-01: qty 1

## 2020-07-01 MED ORDER — CEFAZOLIN SODIUM-DEXTROSE 2-4 GM/100ML-% IV SOLN
2.0000 g | Freq: Three times a day (TID) | INTRAVENOUS | Status: AC
  Administered 2020-07-01 – 2020-07-02 (×2): 2 g via INTRAVENOUS
  Filled 2020-07-01 (×2): qty 100

## 2020-07-01 MED ORDER — DIPHENHYDRAMINE HCL 50 MG/ML IJ SOLN
INTRAMUSCULAR | Status: AC
  Filled 2020-07-01: qty 1

## 2020-07-01 MED ORDER — ALBUMIN HUMAN 5 % IV SOLN
INTRAVENOUS | Status: DC | PRN
Start: 2020-07-01 — End: 2020-07-01

## 2020-07-01 MED ORDER — HYDROMORPHONE HCL 1 MG/ML IJ SOLN
INTRAMUSCULAR | Status: AC
  Filled 2020-07-01: qty 1

## 2020-07-01 MED ORDER — DEXAMETHASONE SODIUM PHOSPHATE 10 MG/ML IJ SOLN
INTRAMUSCULAR | Status: AC
  Filled 2020-07-01: qty 2

## 2020-07-01 MED ORDER — ROCURONIUM BROMIDE 10 MG/ML (PF) SYRINGE
PREFILLED_SYRINGE | INTRAVENOUS | Status: AC
  Filled 2020-07-01: qty 10

## 2020-07-01 MED ORDER — MIDAZOLAM HCL 2 MG/2ML IJ SOLN
INTRAMUSCULAR | Status: DC | PRN
  Administered 2020-07-01: 2 mg via INTRAVENOUS

## 2020-07-01 MED ORDER — DEXAMETHASONE SODIUM PHOSPHATE 10 MG/ML IJ SOLN
INTRAMUSCULAR | Status: DC | PRN
  Administered 2020-07-01: 6 mg via INTRAVENOUS

## 2020-07-01 MED ORDER — PROPOFOL 10 MG/ML IV BOLUS
INTRAVENOUS | Status: AC
  Filled 2020-07-01: qty 20

## 2020-07-01 MED ORDER — ONDANSETRON HCL 4 MG/2ML IJ SOLN
INTRAMUSCULAR | Status: AC
  Filled 2020-07-01: qty 2

## 2020-07-01 MED ORDER — LACTATED RINGERS IV SOLN: INTRAVENOUS | Status: DC

## 2020-07-01 MED ORDER — SCOPOLAMINE 1 MG/3DAYS TD PT72
MEDICATED_PATCH | TRANSDERMAL | Status: DC | PRN
  Administered 2020-07-01: 1 via TRANSDERMAL

## 2020-07-01 MED ORDER — CEFAZOLIN SODIUM-DEXTROSE 2-3 GM-%(50ML) IV SOLR
INTRAVENOUS | Status: DC | PRN
  Administered 2020-07-01: 2 g via INTRAVENOUS

## 2020-07-01 MED ORDER — GABAPENTIN 600 MG PO TABS
300.0000 mg | ORAL_TABLET | Freq: Three times a day (TID) | ORAL | Status: DC
  Filled 2020-07-01 (×3): qty 0.5

## 2020-07-01 SURGICAL SUPPLY — 93 items
APL SKNCLS STERI-STRIP NONHPOA (GAUZE/BANDAGES/DRESSINGS) ×2
BENZOIN TINCTURE PRP APPL 2/3 (GAUZE/BANDAGES/DRESSINGS) ×3 IMPLANT
BIT DRILL 2.2 SS TIBIAL (BIT) ×1 IMPLANT
BIT DRILL 4.8X300 (BIT) ×1 IMPLANT
BLADE CLIPPER SURG (BLADE) IMPLANT
BNDG CMPR 9X4 STRL LF SNTH (GAUZE/BANDAGES/DRESSINGS) ×2
BNDG COHESIVE 4X5 TAN STRL (GAUZE/BANDAGES/DRESSINGS) ×3 IMPLANT
BNDG ELASTIC 2X5.8 VLCR STR LF (GAUZE/BANDAGES/DRESSINGS) ×1 IMPLANT
BNDG ELASTIC 3X5.8 VLCR STR LF (GAUZE/BANDAGES/DRESSINGS) ×3 IMPLANT
BNDG ELASTIC 4X5.8 VLCR STR LF (GAUZE/BANDAGES/DRESSINGS) ×3 IMPLANT
BNDG ESMARK 4X9 LF (GAUZE/BANDAGES/DRESSINGS) ×3 IMPLANT
BRUSH SCRUB EZ PLAIN DRY (MISCELLANEOUS) ×6 IMPLANT
CORD BIPOLAR FORCEPS 12FT (ELECTRODE) ×3 IMPLANT
COVER SURGICAL LIGHT HANDLE (MISCELLANEOUS) ×9 IMPLANT
COVER WAND RF STERILE (DRAPES) ×6 IMPLANT
CUFF TOURN SGL QUICK 18X4 (TOURNIQUET CUFF) ×3 IMPLANT
CUFF TOURN SGL QUICK 24 (TOURNIQUET CUFF)
CUFF TRNQT CYL 24X4X16.5-23 (TOURNIQUET CUFF) IMPLANT
DECANTER SPIKE VIAL GLASS SM (MISCELLANEOUS) IMPLANT
DRAPE C-ARM 42X72 X-RAY (DRAPES) IMPLANT
DRAPE C-ARMOR (DRAPES) ×3 IMPLANT
DRAPE INCISE IOBAN 66X45 STRL (DRAPES) ×3 IMPLANT
DRAPE LAPAROTOMY TRNSV 102X78 (DRAPES) ×3 IMPLANT
DRAPE OEC MINIVIEW 54X84 (DRAPES) IMPLANT
DRAPE U-SHAPE 47X51 STRL (DRAPES) ×6 IMPLANT
DRSG ADAPTIC 3X8 NADH LF (GAUZE/BANDAGES/DRESSINGS) ×1 IMPLANT
DRSG MEPILEX BORDER 4X4 (GAUZE/BANDAGES/DRESSINGS) ×3 IMPLANT
DRSG XEROFORM 1X8 (GAUZE/BANDAGES/DRESSINGS) ×1 IMPLANT
ELECT REM PT RETURN 9FT ADLT (ELECTROSURGICAL) ×3
ELECTRODE REM PT RTRN 9FT ADLT (ELECTROSURGICAL) ×2 IMPLANT
GAUZE SPONGE 4X4 12PLY STRL (GAUZE/BANDAGES/DRESSINGS) ×3 IMPLANT
GAUZE SPONGE 4X4 12PLY STRL LF (GAUZE/BANDAGES/DRESSINGS) ×1 IMPLANT
GAUZE XEROFORM 1X8 LF (GAUZE/BANDAGES/DRESSINGS) ×3 IMPLANT
GLOVE BIO SURGEON STRL SZ7.5 (GLOVE) ×9 IMPLANT
GLOVE BIO SURGEON STRL SZ8 (GLOVE) ×3 IMPLANT
GLOVE BIOGEL PI IND STRL 7.5 (GLOVE) ×2 IMPLANT
GLOVE BIOGEL PI IND STRL 8 (GLOVE) ×6 IMPLANT
GLOVE BIOGEL PI INDICATOR 7.5 (GLOVE) ×1
GLOVE BIOGEL PI INDICATOR 8 (GLOVE) ×3
GOWN STRL REUS W/ TWL LRG LVL3 (GOWN DISPOSABLE) ×8 IMPLANT
GOWN STRL REUS W/ TWL XL LVL3 (GOWN DISPOSABLE) ×4 IMPLANT
GOWN STRL REUS W/TWL LRG LVL3 (GOWN DISPOSABLE) ×12
GOWN STRL REUS W/TWL XL LVL3 (GOWN DISPOSABLE) ×6
K-WIRE 1.6 (WIRE) ×9
K-WIRE FX5X1.6XNS BN SS (WIRE) ×6
KIT BASIN OR (CUSTOM PROCEDURE TRAY) ×6 IMPLANT
KIT TURNOVER KIT B (KITS) ×6 IMPLANT
KWIRE FX5X1.6XNS BN SS (WIRE) IMPLANT
MANIFOLD NEPTUNE II (INSTRUMENTS) ×6 IMPLANT
NDL HYPO 25GX1X1/2 BEV (NEEDLE) IMPLANT
NEEDLE HYPO 25GX1X1/2 BEV (NEEDLE) IMPLANT
NS IRRIG 1000ML POUR BTL (IV SOLUTION) ×9 IMPLANT
PACK GENERAL/GYN (CUSTOM PROCEDURE TRAY) ×3 IMPLANT
PACK ORTHO EXTREMITY (CUSTOM PROCEDURE TRAY) ×3 IMPLANT
PAD ARMBOARD 7.5X6 YLW CONV (MISCELLANEOUS) ×12 IMPLANT
PAD CAST 3X4 CTTN HI CHSV (CAST SUPPLIES) IMPLANT
PAD CAST 4YDX4 CTTN HI CHSV (CAST SUPPLIES) ×2 IMPLANT
PADDING CAST COTTON 3X4 STRL (CAST SUPPLIES) ×3
PADDING CAST COTTON 4X4 STRL (CAST SUPPLIES) ×3
PEG LOCKING SMOOTH 2.2X16 (Screw) ×2 IMPLANT
PEG LOCKING SMOOTH 2.2X18 (Peg) ×5 IMPLANT
PIN GUIDE DRILL TIP 2.8X300 (DRILL) ×1 IMPLANT
PLATE LONG DVR LEFT (Plate) ×1 IMPLANT
SCREW CANN 8X140X40 (Screw) ×1 IMPLANT
SCREW LOCK 12X2.7X 3 LD (Screw) IMPLANT
SCREW LOCK 14X2.7X 3 LD TPR (Screw) IMPLANT
SCREW LOCK 16X2.7X 3 LD TPR (Screw) IMPLANT
SCREW LOCKING 2.7X12MM (Screw) ×3 IMPLANT
SCREW LOCKING 2.7X13MM (Screw) ×1 IMPLANT
SCREW LOCKING 2.7X14 (Screw) ×3 IMPLANT
SCREW LOCKING 2.7X15MM (Screw) ×1 IMPLANT
SCREW LOCKING 2.7X16 (Screw) ×3 IMPLANT
SPLINT PLASTER CAST XFAST 5X30 (CAST SUPPLIES) IMPLANT
SPLINT PLASTER EXTRA FAST 3X15 (CAST SUPPLIES) ×1
SPLINT PLASTER GYPS XFAST 3X15 (CAST SUPPLIES) IMPLANT
SPLINT PLASTER XFAST SET 5X30 (CAST SUPPLIES) ×1
SPONGE LAP 4X18 RFD (DISPOSABLE) ×3 IMPLANT
STAPLER VISISTAT 35W (STAPLE) ×3 IMPLANT
STRIP CLOSURE SKIN 1/2X4 (GAUZE/BANDAGES/DRESSINGS) ×3 IMPLANT
SUT ETHILON 3 0 PS 1 (SUTURE) ×3 IMPLANT
SUT MNCRL AB 4-0 PS2 18 (SUTURE) ×3 IMPLANT
SUT VIC AB 2-0 FS1 27 (SUTURE) ×3 IMPLANT
SYR CONTROL 10ML LL (SYRINGE) IMPLANT
TAPE STRIPS DRAPE STRL (GAUZE/BANDAGES/DRESSINGS) ×1 IMPLANT
TOWEL GREEN STERILE (TOWEL DISPOSABLE) ×9 IMPLANT
TOWEL GREEN STERILE FF (TOWEL DISPOSABLE) ×6 IMPLANT
TOWEL OR NON WOVEN STRL DISP B (DISPOSABLE) ×3 IMPLANT
TUBE CONNECTING 12X1/4 (SUCTIONS) ×3 IMPLANT
UNDERPAD 30X36 HEAVY ABSORB (UNDERPADS AND DIAPERS) ×3 IMPLANT
WASHER 8.0 (Orthopedic Implant) ×1 IMPLANT
WASHER CANN FLAT 8 (Orthopedic Implant) IMPLANT
WATER STERILE IRR 1000ML POUR (IV SOLUTION) ×9 IMPLANT
YANKAUER SUCT BULB TIP NO VENT (SUCTIONS) IMPLANT

## 2020-07-01 NOTE — Op Note (Signed)
07/01/2020 5:02 PM  PATIENT:  Brenda Spencer  PRE-OPERATIVE DIAGNOSIS:  UNSTABLE PELVIC RING FRACTURE, LATERAL COMPRESSION TYPE 3  POST-OPERATIVE DIAGNOSIS:  UNSTABLE PELVIC RING FRACTURE, LATERAL COMPRESSION TYPE 3  PROCEDURE:  Procedure(s): PERCUTANEOUS SACRO-ILIAC SCREW FIXATION, LEFT AND RIGHT (BILATERAL), OF THE POSTERIOR PELVIC RING, with Biomet Zimmer 8.0 mm screw and washer 124mm star drive  SURGEON:  Surgeon(s) and Role:    Altamese Lovelady, MD - Primary  ASSISTANTS: none   ANESTHESIA:   general  EBL:  15 mL   BLOOD ADMINISTERED:none  DRAINS: none   LOCAL MEDICATIONS USED:  NONE  SPECIMEN:  No Specimen  DISPOSITION OF SPECIMEN:  N/A  COUNTS:  YES  TOURNIQUET:  * No tourniquets in log *  DICTATION: .Note written in EPIC  PLAN OF CARE: Admit to inpatient   PATIENT DISPOSITION:  Remained in OR for Dr. Debroah Loop procedure - hemodynamically stable.   Delay start of Pharmacological VTE agent (>24hrs) due to surgical blood loss or risk of bleeding: no  BRIEF SUMMARY AND INDICATIONS FOR PROCEDURE:  Patient sustained an unstable pelvic ring injury. Because of the nature and magnitude of these injuries, Dr. Fredonia Highland deemed further management outside his scope of practice and asserted these injuries would be best managed by fellowship trained orthopedic traumatologist.  Consequently, I was consulted to assume management.  We discussed with the risks and benefits of the surgery with patient including nonunion,, malunion, arthritis, loss of fixation, sexual dysfunction, pain, nerve injury, vessel injury, DVT, PE, and others.  We also specifically discussed urologic injury and footdrop. After full discussion, consent obtained and decision made to proceed.   BRIEF SUMMARY OF PROCEDURE:  The patient was taken to the operating room, where general anesthesia was induced.  The abdomen and flank were prepped and draped in the usual sterile fashion. Attention was turned  first to the left SI joint.  A true lateral view of S1 vertebral body was used to obtain the correct starting point and trajectory. The guide pin was advanced through the iliac bone, across the left SI joint and into the S1 vetebral body. Before advancing the pin we checked its position on the inlet to make sure that it was posterior to the ala and anterior to the canal. We checked the outlet to make sure that it was superior to the S1 foramina and between the vertebral discs.  The guide pin was advanced. We then turned our attention to the right side of the posterior pelvis and further advanced the wire across the right ala, the SI joint, and to the outer cortex of the right ilium.  On this side we also checked  its position on the inlet to make sure that it was posterior to the ala and anterior to the canal, and the outlet to confirm superior to the S1 foramina and below the ala. The pin was measured for length, overdrilled, and then the screw with washer inserted. During both pin and screw placement my assistant manipulated the pelvis to assist reduction.  Final images showed appropriate placement and length across both sides of the pelvis. Wounds were irrigated thoroughly and closed in standard fashion with nylon. Sterile dressings were applied.    PROGNOSIS:  With regard to mobilization, the patient will be WBAT for transfers on the right and NWB on the right. Patient is at risk for SI joint arthrosis and pain. Patient will remain on the Trauma Service and may restart pharmacologic  DVT prophylaxis if no other  contraindications.  Will follow closely.  The patient remained in the room for Dr. Fredonia Highland to repair her radius fracture.       [name]. Marcelino Scot, M.D.
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
## 2020-07-01 NOTE — Anesthesia Preprocedure Evaluation (Addendum)
Anesthesia Evaluation  Patient identified by MRN, date of birth, ID band Patient awake    Reviewed: Allergy & Precautions, NPO status , Patient's Chart, lab work & pertinent test results  History of Anesthesia Complications Negative for: history of anesthetic complications  Airway Mallampati: II  TM Distance: >3 FB     Dental no notable dental hx. (+) Dental Advisory Given   Pulmonary asthma ,    Pulmonary exam normal        Cardiovascular negative cardio ROS Normal cardiovascular exam     Neuro/Psych  Headaches, PSYCHIATRIC DISORDERS Depression    GI/Hepatic negative GI ROS, Neg liver ROS,   Endo/Other  negative endocrine ROS  Renal/GU negative Renal ROS     Musculoskeletal negative musculoskeletal ROS (+)   Abdominal   Peds  Hematology negative hematology ROS (+)   Anesthesia Other Findings   Reproductive/Obstetrics                            Anesthesia Physical Anesthesia Plan  ASA: III  Anesthesia Plan: General   Post-op Pain Management:    Induction: Intravenous  PONV Risk Score and Plan: 4 or greater and Ondansetron, Dexamethasone, Scopolamine patch - Pre-op and Midazolam  Airway Management Planned: Oral ETT  Additional Equipment:   Intra-op Plan:   Post-operative Plan: Extubation in OR  Informed Consent: I have reviewed the patients History and Physical, chart, labs and discussed the procedure including the risks, benefits and alternatives for the proposed anesthesia with the patient or authorized representative who has indicated his/her understanding and acceptance.       Plan Discussed with: Anesthesiologist and CRNA  Anesthesia Plan Comments:        Anesthesia Quick Evaluation
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
## 2020-07-01 NOTE — Progress Notes (Signed)
Orthopedic Tech Progress Note Patient Details:  Brenda Spencer [birth year] [id number] Spoke with PA this morning, and patient is refusing VELCRO WHO Patient ID: [name], female   DOB: 1991/10/15, 29 y.o.   MRN: [id number]   Janit Pagan 07/01/2020, 8:28 AM
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
## 2020-07-01 NOTE — TOC CAGE-AID Note (Signed)
Transition of Care [facility]) - CAGE-AID Screening   Patient Details  Name: Brenda Spencer MRN: [id number] Date of Birth: September 03, 1991  Transition of Care [facility]) CM/SW Contact:    Emeterio Reeve, [place] Phone Number: 07/01/2020, 1:07 PM   Clinical Narrative:  CSW met with pt at bedside. CSW introduced self and explained her role at the hospital.  Pt reports occasional alcohol use. Pt reports that she used to use "ICE" (methamphetamines) in the past but has not used in over a month. Pt stated she stopped on her own and with the support of her family. CSW praised pt for quitting use on her on. Pt thanked CSW. Pt denied resources at this time.   CAGE-AID Screening:    Have You Ever Felt You Ought to Cut Down on Your Drinking or Drug Use?: No Have People Annoyed You By Critizing Your Drinking Or Drug Use?: No Have You Felt Bad Or Guilty About Your Drinking Or Drug Use?: No Have You Ever Had a Drink or Used Drugs First Thing In The Morning to Steady Your Nerves or to Get Rid of a Hangover?: No CAGE-AID Score: 0  Substance Abuse Education Offered: Yes  Substance abuse interventions: Patient Counseling   Emeterio Reeve, Latanya Presser, [place] Social Worker [phone]

## 2020-07-01 NOTE — Transfer of Care (Signed)
Immediate Anesthesia Transfer of Care Note  Patient: Brenda Spencer  Procedure(s) Performed: SACRO-ILIAC PINNING (Left Pelvis) OPEN REDUCTION INTERNAL FIXATION (ORIF) DISTAL RADIAL FRACTURE (Left Wrist)  Patient Location: PACU  Anesthesia Type:General  Level of Consciousness: drowsy and patient cooperative  Airway & Oxygen Therapy: Patient Spontanous Breathing and Patient connected to face mask oxygen  Post-op Assessment: Report given to RN and Post -op Vital signs reviewed and stable  Post vital signs: Reviewed and stable  Last Vitals:  Vitals Value Taken Time  BP 119/85 07/01/20 1731  Temp    Pulse 112 07/01/20 1736  Resp 13 07/01/20 1736  SpO2 100 % 07/01/20 1736  Vitals shown include unvalidated device data.  Last Pain:  Vitals:   07/01/20 1312  TempSrc:   PainSc: 2       Patients Stated Pain Goal: 1 (60/10/93 2355)  Complications: No complications documented.
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
## 2020-07-01 NOTE — Anesthesia Procedure Notes (Addendum)
Procedure Name: Intubation Date/Time: 07/01/2020 3:21 PM Performed by: Bryson Corona, CRNA Pre-anesthesia Checklist: Patient identified, Emergency Drugs available, Suction available and Patient being monitored Patient Re-evaluated:Patient Re-evaluated prior to induction Oxygen Delivery Method: Circle System Utilized Preoxygenation: Pre-oxygenation with 100% oxygen Induction Type: IV induction Ventilation: Mask ventilation without difficulty Laryngoscope Size: Mac and 3 Grade View: Grade I Tube type: Oral Tube size: 7.0 mm Number of attempts: 1 Airway Equipment and Method: Stylet Placement Confirmation: ETT inserted through vocal cords under direct vision,  positive ETCO2 and breath sounds checked- equal and bilateral Secured at: 22 cm Tube secured with: Tape Dental Injury: Teeth and Oropharynx as per pre-operative assessment
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
## 2020-07-01 NOTE — Plan of Care (Signed)

## 2020-07-02 ENCOUNTER — Encounter (HOSPITAL_COMMUNITY): Payer: Self-pay | Admitting: Orthopedic Surgery

## 2020-07-02 LAB — CBC
HCT: 24.6 % — ABNORMAL LOW (ref 36.0–46.0)
Hemoglobin: 7.8 g/dL — ABNORMAL LOW (ref 12.0–15.0)
MCH: 27.7 pg (ref 26.0–34.0)
MCHC: 31.7 g/dL (ref 30.0–36.0)
MCV: 87.2 fL (ref 80.0–100.0)
Platelets: 262 10*3/uL (ref 150–400)
RBC: 2.82 MIL/uL — ABNORMAL LOW (ref 3.87–5.11)
RDW: 13.4 % (ref 11.5–15.5)
WBC: 8 10*3/uL (ref 4.0–10.5)
nRBC: 0 % (ref 0.0–0.2)

## 2020-07-02 LAB — VITAMIN D 25 HYDROXY (VIT D DEFICIENCY, FRACTURES): Vit D, 25-Hydroxy: 8.21 ng/mL — ABNORMAL LOW (ref 30–100)

## 2020-07-02 MED ORDER — VITAMIN D 25 MCG (1000 UNIT) PO TABS
2000.0000 [IU] | ORAL_TABLET | Freq: Two times a day (BID) | ORAL | Status: DC
  Administered 2020-07-02 – 2020-07-08 (×12): 2000 [IU] via ORAL
  Filled 2020-07-02 (×12): qty 2

## 2020-07-02 MED ORDER — HYDROMORPHONE HCL 1 MG/ML IJ SOLN: 1.0000 mg | INTRAMUSCULAR | Status: DC | PRN

## 2020-07-02 MED ORDER — VITAMIN D (ERGOCALCIFEROL) 1.25 MG (50000 UNIT) PO CAPS
50000.0000 [IU] | ORAL_CAPSULE | ORAL | Status: DC
  Administered 2020-07-02: 50000 [IU] via ORAL
  Filled 2020-07-02: qty 1

## 2020-07-02 MED ORDER — SCOPOLAMINE 1 MG/3DAYS TD PT72
1.0000 | MEDICATED_PATCH | TRANSDERMAL | Status: DC
  Administered 2020-07-02: 1.5 mg via TRANSDERMAL
  Filled 2020-07-02: qty 1

## 2020-07-02 MED ORDER — HYDROMORPHONE HCL 1 MG/ML IJ SOLN
1.0000 mg | INTRAMUSCULAR | Status: DC | PRN
  Administered 2020-07-02 – 2020-07-03 (×3): 1 mg via INTRAVENOUS
  Filled 2020-07-02 (×4): qty 1

## 2020-07-02 MED ORDER — ASCORBIC ACID 500 MG PO TABS
1000.0000 mg | ORAL_TABLET | Freq: Every day | ORAL | Status: DC
  Administered 2020-07-02 – 2020-07-08 (×7): 1000 mg via ORAL
  Filled 2020-07-02 (×7): qty 2

## 2020-07-02 MED ORDER — ADULT MULTIVITAMIN W/MINERALS CH: 1.0000 | ORAL_TABLET | Freq: Every day | ORAL | Status: DC

## 2020-07-02 MED ORDER — POLYETHYLENE GLYCOL 3350 17 G PO PACK
17.0000 g | PACK | Freq: Every day | ORAL | Status: DC
  Administered 2020-07-03: 17 g via ORAL
  Filled 2020-07-02 (×2): qty 1

## 2020-07-02 NOTE — Progress Notes (Signed)
Rehab Admissions Coordinator Note:  Patient was screened by Cleatrice Burke for appropriateness for an Inpatient Acute Rehab Consult per therapy recs. .  At this time, we are recommending Inpatient Rehab consult. I will place order per protocol.  Cleatrice Burke RN MSN 07/02/2020, 3:16 PM  I can be reached at [phone].
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
## 2020-07-02 NOTE — Progress Notes (Signed)
Pt transferred to and from [facility] x2 but unable to void, bladder scan volume at 18:20=405cc. In and out cath order received from Ainsley Spinner, [place] for bladder scan volume of >400cc x 2 occurrences. In and out cath done with 400cc urine output.
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
## 2020-07-02 NOTE — Anesthesia Postprocedure Evaluation (Signed)
Anesthesia Post Note  Patient: Brenda Spencer  Procedure(s) Performed: SACRO-ILIAC PINNING (Left Pelvis) OPEN REDUCTION INTERNAL FIXATION (ORIF) DISTAL RADIAL FRACTURE (Left Wrist)     Patient location during evaluation: PACU Anesthesia Type: General Level of consciousness: sedated Pain management: pain level controlled Vital Signs Assessment: post-procedure vital signs reviewed and stable Respiratory status: spontaneous breathing and respiratory function stable Cardiovascular status: stable Postop Assessment: no apparent nausea or vomiting Anesthetic complications: no   No complications documented.                Owen Pagnotta DANIEL

## 2020-07-02 NOTE — Plan of Care (Signed)

## 2020-07-03 LAB — CBC
HCT: 25.3 % — ABNORMAL LOW (ref 36.0–46.0)
Hemoglobin: 8 g/dL — ABNORMAL LOW (ref 12.0–15.0)
MCH: 27.9 pg (ref 26.0–34.0)
MCHC: 31.6 g/dL (ref 30.0–36.0)
MCV: 88.2 fL (ref 80.0–100.0)
Platelets: 270 10*3/uL (ref 150–400)
RBC: 2.87 MIL/uL — ABNORMAL LOW (ref 3.87–5.11)
RDW: 13.8 % (ref 11.5–15.5)
WBC: 6.5 10*3/uL (ref 4.0–10.5)
nRBC: 0 % (ref 0.0–0.2)

## 2020-07-03 MED ORDER — HYDROMORPHONE HCL 1 MG/ML IJ SOLN
1.0000 mg | Freq: Four times a day (QID) | INTRAMUSCULAR | Status: DC | PRN
  Administered 2020-07-03 – 2020-07-04 (×2): 1 mg via INTRAVENOUS
  Filled 2020-07-03 (×2): qty 1

## 2020-07-03 MED ORDER — METHOCARBAMOL 500 MG PO TABS
1000.0000 mg | ORAL_TABLET | Freq: Three times a day (TID) | ORAL | Status: DC
  Administered 2020-07-03 – 2020-07-08 (×15): 1000 mg via ORAL
  Filled 2020-07-03 (×15): qty 2

## 2020-07-03 MED ORDER — BISACODYL 10 MG RE SUPP
10.0000 mg | Freq: Once | RECTAL | Status: DC
  Filled 2020-07-03: qty 1

## 2020-07-03 MED ORDER — BETHANECHOL CHLORIDE 10 MG PO TABS
10.0000 mg | ORAL_TABLET | Freq: Three times a day (TID) | ORAL | Status: DC
  Administered 2020-07-03 – 2020-07-08 (×17): 10 mg via ORAL
  Filled 2020-07-03 (×18): qty 1

## 2020-07-03 MED ORDER — APIXABAN 2.5 MG PO TABS
2.5000 mg | ORAL_TABLET | Freq: Two times a day (BID) | ORAL | Status: DC
  Administered 2020-07-03 – 2020-07-08 (×10): 2.5 mg via ORAL
  Filled 2020-07-03 (×10): qty 1
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
## 2020-07-03 NOTE — Progress Notes (Signed)
Inpatient Rehabilitation Admissions Coordinator  Inpatient rehab consult received. I met with patient at bedside to discuss goals and expectations of a possible CIR admit. She prefers direct d/c home with her Mom who can provide 24/7 care. Patient feels once her pain has lessened, that she can go home. I will follow her progress with therapy, but likely direct d/c home per patient preference.  Danne Baxter, RN, MSN Rehab Admissions Coordinator [phone] 07/03/2020 10:54 AM

## 2020-07-03 NOTE — Plan of Care (Signed)

## 2020-07-03 NOTE — Progress Notes (Signed)
3143 Received order for Cardiac Rehab. Pt does not have cardiac diagnosis so will not be following pt. Graylon Good RN BSN 07/03/2020 9:20 AM

## 2020-07-03 NOTE — TOC Progression Note (Signed)
Transition of Care [facility]) - Progression Note    Patient Details  Name: Brenda Spencer MRN: [id number] Date of Birth: [birth year]  [facility]) CM/SW Contact  Ella Bodo, RN Phone Number: 07/03/2020, 4:01 PM  Clinical Narrative:  Noted pt refusing CIR, and wishes to go home with [facility] services.  Currently there are no HH agencies that are accepting Medicaid.  We certainly can arrange OP therapies as long as patient can tolerate transportation to appointments.  If not, she may need to consider inpatient rehab again.  Will follow.    Expected Discharge Plan: IP Rehab Facility Barriers to Discharge: Continued Medical Work up  Expected Discharge Plan and Services Expected Discharge Plan: [place]   Discharge Planning Services: CM Consult   Living arrangements for the past 2 months: Single Family Home                                       Social Determinants of Health (SDOH) Interventions    Readmission Risk Interventions No flowsheet data found.  Reinaldo Raddle, RN, BSN  Trauma/Neuro ICU Case Manager [phone]

## 2020-07-03 NOTE — Progress Notes (Signed)
Inpatient Rehabilitation Admissions Coordinator  Noted issues with [facility] with Medicaid. I will follow her progress to assess if she would be able to tolerate the intensity of rehab services required of admit.   Danne Baxter, RN, MSN Rehab Admissions Coordinator [phone] 07/03/2020 5:06 PM

## 2020-07-03 NOTE — Progress Notes (Signed)
Patient bladder scan for 452 ml in and out cath perform after patient was unable to void per doctors orders.

## 2020-07-04 LAB — CBC
HCT: 23.9 % — ABNORMAL LOW (ref 36.0–46.0)
Hemoglobin: 7.7 g/dL — ABNORMAL LOW (ref 12.0–15.0)
MCH: 28.5 pg (ref 26.0–34.0)
MCHC: 32.2 g/dL (ref 30.0–36.0)
MCV: 88.5 fL (ref 80.0–100.0)
Platelets: 295 10*3/uL (ref 150–400)
RBC: 2.7 MIL/uL — ABNORMAL LOW (ref 3.87–5.11)
RDW: 14.2 % (ref 11.5–15.5)
WBC: 6.4 10*3/uL (ref 4.0–10.5)
nRBC: 0 % (ref 0.0–0.2)

## 2020-07-04 MED ORDER — HYDROMORPHONE HCL 1 MG/ML IJ SOLN
1.0000 mg | Freq: Two times a day (BID) | INTRAMUSCULAR | Status: DC | PRN
  Administered 2020-07-04: 1 mg via INTRAVENOUS
  Filled 2020-07-04: qty 1

## 2020-07-04 MED ORDER — GABAPENTIN 400 MG PO CAPS
400.0000 mg | ORAL_CAPSULE | Freq: Three times a day (TID) | ORAL | Status: DC
  Administered 2020-07-04 – 2020-07-08 (×14): 400 mg via ORAL
  Filled 2020-07-04 (×14): qty 1

## 2020-07-04 MED ORDER — OXYCODONE HCL 5 MG PO TABS: 5.0000 mg | ORAL_TABLET | Freq: Every day | ORAL | Status: DC | PRN
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
## 2020-07-04 NOTE — Progress Notes (Signed)
Inpatient Rehabilitation Admissions Coordinator  I met with patient at bedside. I discussed that she would have to wean off IV pain meds and demonstrate the ability to do and need 3 hours per day of therapy to qualify for CIR admit. I will follow up tomorrow.  Danne Baxter, RN, MSN Rehab Admissions Coordinator [phone] 07/04/2020 2:20 PM
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
## 2020-07-05 MED ORDER — POLYETHYLENE GLYCOL 3350 17 G PO PACK
17.0000 g | PACK | Freq: Two times a day (BID) | ORAL | Status: DC
  Administered 2020-07-05 – 2020-07-08 (×2): 17 g via ORAL
  Filled 2020-07-05 (×6): qty 1

## 2020-07-05 MED ORDER — BISACODYL 10 MG RE SUPP
10.0000 mg | Freq: Once | RECTAL | Status: DC
  Filled 2020-07-05: qty 1

## 2020-07-05 NOTE — Progress Notes (Signed)
Inpatient Rehabilitation Admissions Coordinator  I do not have a CIR bed available to admit patient to today. I have made patient aware as well as therapy and TOC. There may not be a bed over the weekend, so I recommend that therapy and nursing continue to work with her to progress therapy and education for the possibility that she may d/c directly home.   Danne Baxter, RN, MSN Rehab Admissions Coordinator [phone] 07/05/2020 11:12 AM
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
## 2020-07-05 NOTE — Discharge Instructions (Addendum)
Information on my medicine - ELIQUIS (apixaban)  This medication education was reviewed with me or my healthcare representative as part of my discharge preparation.    Why was Eliquis prescribed for you? Eliquis was prescribed for you to reduce the risk of blood clots forming after orthopedic surgery.    What do You need to know about Eliquis? Take your Eliquis TWICE DAILY - one tablet in the morning and one tablet in the evening with or without food.  It would be best to take the dose about the same time each day.  If you have difficulty swallowing the tablet whole please discuss with your pharmacist how to take the medication safely.  Take Eliquis exactly as prescribed by your doctor and DO NOT stop taking Eliquis without talking to the doctor who prescribed the medication.  Stopping without other medication to take the place of Eliquis may increase your risk of developing a clot.  After discharge, you should have regular check-up appointments with your healthcare provider that is prescribing your Eliquis.  What do you do if you miss a dose? If a dose of ELIQUIS is not taken at the scheduled time, take it as soon as possible on the same day and twice-daily administration should be resumed.  The dose should not be doubled to make up for a missed dose.  Do not take more than one tablet of ELIQUIS at the same time.  Important Safety Information A possible side effect of Eliquis is bleeding. You should call your healthcare provider right away if you experience any of the following: ? Bleeding from an injury or your nose that does not stop. ? Unusual colored urine (red or dark brown) or unusual colored stools (red or black). ? Unusual bruising for unknown reasons. ? A serious fall or if you hit your head (even if there is no bleeding).  Some medicines may interact with Eliquis and might increase your risk of bleeding or clotting while on Eliquis. To help avoid this, consult your  healthcare provider or pharmacist prior to using any new prescription or non-prescription medications, including herbals, vitamins, non-steroidal anti-inflammatory drugs (NSAIDs) and supplements.  This website has more information on Eliquis (apixaban): http://[email or web address].com/eliquis/home    Orthopaedic Trauma Service Discharge Instructions   General Discharge Instructions  Orthopaedic Injuries:  Pelvic ring fracture treated with screw fixation              Left radius fracture treated with plate and screws  WEIGHT BEARING STATUS: Weightbearing on Right leg for transfers only. Nonweightbearing left leg and wrist   RANGE OF MOTION/ACTIVITY: unrestricted motion of hips, knees and ankles. Ok to move fingers and elbow of left arm   Bone health:  Continue taking vitamin D supplementation for bone health  Wound Care: do not remove splint on L arm.  Ok to change dressing as needed to left flank. Can leave this incision open to the air   DVT/PE prophylaxis: eliquis: one tablet 2x day x 6 weeks   Diet: as you were eating previously.  Can use over the counter stool softeners and bowel preparations, such as Miralax, to help with bowel movements.  Narcotics can be constipating.  Be sure to drink plenty of fluids  PAIN MEDICATION USE AND EXPECTATIONS  You have likely been given narcotic medications to help control your pain.  After a traumatic event that results in an fracture (broken bone) with or without surgery, it is ok to use narcotic pain medications to help control one's  pain.  We understand that everyone responds to pain differently and each individual patient will be evaluated on a regular basis for the continued need for narcotic medications. Ideally, narcotic medication use should last no more than 6-8 weeks (coinciding with fracture healing).   As a patient it is your responsibility as well to monitor narcotic medication use and report the amount and frequency you use these medications  when you come to your office visit.   We would also advise that if you are using narcotic medications, you should take a dose prior to therapy to maximize you participation.  IF YOU ARE ON NARCOTIC MEDICATIONS IT IS NOT PERMISSIBLE TO OPERATE A MOTOR VEHICLE (MOTORCYCLE/CAR/TRUCK/MOPED) OR HEAVY MACHINERY DO NOT MIX NARCOTICS WITH OTHER CNS (CENTRAL NERVOUS SYSTEM) DEPRESSANTS SUCH AS ALCOHOL   STOP SMOKING OR USING NICOTINE PRODUCTS!!!!  As discussed nicotine severely impairs your body's ability to heal surgical and traumatic wounds but also impairs bone healing.  Wounds and bone heal by forming microscopic blood vessels (angiogenesis) and nicotine is a vasoconstrictor (essentially, shrinks blood vessels).  Therefore, if vasoconstriction occurs to these microscopic blood vessels they essentially disappear and are unable to deliver necessary nutrients to the healing tissue.  This is one modifiable factor that you can do to dramatically increase your chances of healing your injury.    (This means no smoking, no nicotine gum, patches, etc)  DO NOT USE NONSTEROIDAL ANTI-INFLAMMATORY DRUGS (NSAID'S)  Using products such as Advil (ibuprofen), Aleve (naproxen), Motrin (ibuprofen) for additional pain control during fracture healing can delay and/or prevent the healing response.  If you would like to take over the counter (OTC) medication, Tylenol (acetaminophen) is ok.  However, some narcotic medications that are given for pain control contain acetaminophen as well. Therefore, you should not exceed more than 4000 mg of tylenol in a day if you do not have liver disease.  Also note that there are may OTC medicines, such as cold medicines and allergy medicines that my contain tylenol as well.  If you have any questions about medications and/or interactions please ask your doctor/PA or your pharmacist.      ICE AND ELEVATE INJURED/OPERATIVE EXTREMITY  Using ice and elevating the injured extremity above your  heart can help with swelling and pain control.  Icing in a pulsatile fashion, such as 20 minutes on and 20 minutes off, can be followed.    Do not place ice directly on skin. Make sure there is a barrier between to skin and the ice pack.    Using frozen items such as frozen peas works well as the conform nicely to the are that needs to be iced.  USE AN ACE WRAP OR TED HOSE FOR SWELLING CONTROL  In addition to icing and elevation, Ace wraps or TED hose are used to help limit and resolve swelling.  It is recommended to use Ace wraps or TED hose until you are informed to stop.    When using Ace Wraps start the wrapping distally (farthest away from the body) and wrap proximally (closer to the body)   Example: If you had surgery on your leg or thing and you do not have a splint on, start the ace wrap at the toes and work your way up to the thigh        If you had surgery on your upper extremity and do not have a splint on, start the ace wrap at your fingers and work your way up to the upper  arm  IF YOU ARE IN A SPLINT OR CAST DO NOT REMOVE IT FOR ANY REASON   If your splint gets wet for any reason please contact the office immediately. You may shower in your splint or cast as long as you keep it dry.  This can be done by wrapping in a cast cover or garbage back (or similar)  Do Not stick any thing down your splint or cast such as pencils, money, or hangers to try and scratch yourself with.  If you feel itchy take benadryl as prescribed on the bottle for itching  IF YOU ARE IN A CAM BOOT (BLACK BOOT)  You may remove boot periodically. Perform daily dressing changes as noted below.  Wash the liner of the boot regularly and wear a sock when wearing the boot. It is recommended that you sleep in the boot until told otherwise    Call office for the following:  Temperature greater than 101F  Persistent nausea and vomiting  Severe uncontrolled pain  Redness, tenderness, or signs of infection (pain,  swelling, redness, odor or green/yellow discharge around the site)  Difficulty breathing, headache or visual disturbances  Hives  Persistent dizziness or light-headedness  Extreme fatigue  Any other questions or concerns you may have after discharge  In an emergency, call 911 or go to an Emergency Department at a nearby hospital  HELPFUL INFORMATION   ? You should wean off your narcotic medicines as soon as you are able.  Most patients will be off or using minimal narcotics before their first postop appointment.   ? Do not drink alcoholic beverages or take illicit drugs when taking pain medications.  ? In most states it is against the law to drive while you are in a splint or sling.  And certainly against the law to drive while taking narcotics.  ? Pain medication may make you constipated.  Below are a few solutions to try in this order: - Decrease the amount of pain medication if you aren't having pain. - Drink lots of decaffeinated fluids. - Drink prune juice and/or each dried prunes      CALL THE OFFICE WITH ANY QUESTIONS OR CONCERNS: [phone]   VISIT OUR WEBSITE FOR ADDITIONAL INFORMATION: [email or web address]   Transverse Process Fracture  Bones of the spine (vertebrae) have portions that extend off to either side of the spine. These portions of bone are called transverse processes. A transverse process fracture, which is also called a rotation spine fracture, is a break in a transverse process. What are the causes? This condition may be caused by:  A fall from a great height.  A car accident.  A sports injury.  A gunshot wound.  A hard, direct hit to the back. This kind of fracture often results from a sudden and severe bending of the spine to one side. Depending on the cause of the fracture, one or more bones may be affected. What increases the risk? You are more likely to develop this condition if:  You have thinning and loss of density in the bones  (osteoporosis).  You play a contact sport. What are the signs or symptoms? The main symptom of this condition is back pain. The pain may:  Be felt on the side of the spine (flank) where the fracture is.  Get worse when you move or take a deep breath. How is this diagnosed? This condition may be diagnosed based on:  Your symptoms.  Your medical history.  A physical exam. You  may also have other tests, including:  X-rays.  A CT scan.  MRI. How is this treated? Most transverse process fractures heal on their own with time and rest. Treatment may involve supportive care, such as:  Limiting activity.  Medicines, such as: ? Pain medicine. ? Muscle-relaxing medicine.  Physical therapy.  A neck or back brace. Follow these instructions at home: If you have a brace:  Wear the neck or back brace as told by your health care provider. Remove it only as told by your health care provider.  Keep the brace clean.  If the brace is not waterproof: ? Do not let it get wet. ? Cover it with a watertight covering when you take a bath or a shower. Managing pain, stiffness, and swelling   If directed, put ice on the injured area: ? If you have a removable brace, remove it as told by your health care provider. ? Put ice in a plastic bag. ? Place a towel between your skin and the bag. ? Leave the ice on for 20 minutes, 2-3 times a day. Medicines  Take over-the-counter and prescription medicines only as told by your health care provider.  Do not drive or use heavy machinery while taking prescription pain medicine.  If you are taking prescription pain medicine, take actions to prevent or treat constipation. Your health care provider may recommend that you: ? Drink enough fluid to keep your urine pale yellow. ? Eat foods that are high in fiber, such as fresh fruits and vegetables, whole grains, and beans. ? Limit foods that are high in fat and processed sugars, such as fried or sweet  foods. ? Take an over-the-counter or prescription medicine for constipation. Activity  Stay in bed (on bed rest) only as directed by your health care provider. ? Avoid being in bed for a long time without moving. Get up to take short walks every 1-2 hours. This is important to improve blood flow and breathing. Ask for help if you feel weak or unsteady.  Return to your normal activities when your health care provider says it is okay. Ask if there are any activities that you should not do.  Do physical therapy exercises as recommended by your health care provider. General instructions  Do not use any products that contain nicotine or tobacco, such as cigarettes and e-cigarettes. These can delay bone healing. If you need help quitting, ask your health care provider.  Keep all follow-up visits as told by your health care provider. This is important. Visits can help to prevent permanent injury, disability, and long-lasting (chronic) pain. Contact a health care provider if:  You have a fever.  You develop a cough that makes your pain worse.  Your pain medicine is not helping.  Your pain does not get better over time.  You cannot return to your normal activities as planned or expected. Get help right away if:  Your pain is very bad and it suddenly gets worse.  You are unable to move any body part (paralysis) that is below the level of your injury.  You have numbness, tingling, or weakness in any body part that is below the level of your injury.  You cannot control your bladder or bowels. Summary  A transverse process fracture is a break in the portion of the bone that extends to the side of the spine.  Most transverse process fractures heal on their own with time and rest.  You may also have supportive treatments such as  a back brace, pain medicines, and physical therapy.  Keep all follow-up visits. This is important and will help to prevent permanent injury, disability, and  long-lasting (chronic) pain. This information is not intended to replace advice given to you by your health care provider. Make sure you discuss any questions you have with your health care provider. Document Revised: 01/12/2018 Document Reviewed: 01/12/2018 Elsevier Patient Education  2020 [employer].
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
## 2020-07-06 NOTE — Plan of Care (Signed)

## 2020-07-06 NOTE — Plan of Care (Signed)
  Problem: Education: Goal: Knowledge of General Education information will improve Description: Including pain rating scale, medication(s)/side effects and non-pharmacologic comfort measures Outcome: Progressing   Problem: Health Behavior/Discharge Planning: Goal: Ability to manage health-related needs will improve Outcome: Progressing   Problem: Clinical Measurements: Goal: Will remain free from infection Outcome: Progressing   Problem: Activity: Goal: Risk for activity intolerance will decrease Outcome: Progressing   Problem: Safety: Goal: Ability to remain free from injury will improve Outcome: Progressing   

## 2020-07-06 NOTE — Plan of Care (Signed)
  Problem: Pain Managment: Goal: General experience of comfort will improve Outcome: Progressing   Problem: Safety: Goal: Ability to remain free from injury will improve Outcome: Progressing   Problem: Skin Integrity: Goal: Risk for impaired skin integrity will decrease Outcome: Progressing   
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
## 2020-07-07 NOTE — Plan of Care (Signed)
?  Problem: Education: ?Goal: Knowledge of General Education information will improve ?Description: Including pain rating scale, medication(s)/side effects and non-pharmacologic comfort measures ?Outcome: Progressing ?  ?Problem: Health Behavior/Discharge Planning: ?Goal: Ability to manage health-related needs will improve ?Outcome: Progressing ?  ?Problem: Clinical Measurements: ?Goal: Will remain free from infection ?Outcome: Progressing ?  ?Problem: Activity: ?Goal: Risk for activity intolerance will decrease ?Outcome: Progressing ?  ?Problem: Nutrition: ?Goal: Adequate nutrition will be maintained ?Outcome: Progressing ?  ?Problem: Elimination: ?Goal: Will not experience complications related to bowel motility ?Outcome: Progressing ?  ?Problem: Pain Managment: ?Goal: General experience of comfort will improve ?Outcome: Progressing ?  ?Problem: Safety: ?Goal: Ability to remain free from injury will improve ?Outcome: Progressing ?  ?

## 2020-07-08 ENCOUNTER — Inpatient Hospital Stay (HOSPITAL_COMMUNITY): Payer: No Typology Code available for payment source

## 2020-07-08 MED ORDER — OXYCODONE HCL 10 MG PO TABS: 10.0000 mg | ORAL_TABLET | ORAL | 0 refills | Status: DC | PRN

## 2020-07-08 MED ORDER — ASCORBIC ACID 1000 MG PO TABS: 1000.0000 mg | ORAL_TABLET | Freq: Every day | ORAL | Status: DC

## 2020-07-08 MED ORDER — GABAPENTIN 400 MG PO CAPS: 400.0000 mg | ORAL_CAPSULE | Freq: Three times a day (TID) | ORAL | 0 refills | Status: DC

## 2020-07-08 MED ORDER — VITAMIN D3 25 MCG PO TABS: 2000.0000 [IU] | ORAL_TABLET | Freq: Two times a day (BID) | ORAL | Status: DC

## 2020-07-08 MED ORDER — MAGNESIUM CITRATE PO SOLN: 1.0000 | Freq: Once | ORAL | Status: DC

## 2020-07-08 MED ORDER — METHOCARBAMOL 500 MG PO TABS: 1000.0000 mg | ORAL_TABLET | Freq: Three times a day (TID) | ORAL | 0 refills | Status: DC

## 2020-07-08 MED ORDER — BETHANECHOL CHLORIDE 10 MG PO TABS: 10.0000 mg | ORAL_TABLET | Freq: Three times a day (TID) | ORAL | 0 refills | Status: DC

## 2020-07-08 MED ORDER — APIXABAN 2.5 MG PO TABS: 2.5000 mg | ORAL_TABLET | Freq: Two times a day (BID) | ORAL | 0 refills | Status: DC

## 2020-07-08 MED ORDER — POLYETHYLENE GLYCOL 3350 17 G PO PACK: 17.0000 g | PACK | Freq: Two times a day (BID) | ORAL | 0 refills | Status: DC

## 2020-07-08 MED ORDER — ACETAMINOPHEN 500 MG PO TABS: 1000.0000 mg | ORAL_TABLET | Freq: Four times a day (QID) | ORAL | 0 refills | Status: AC

## 2020-07-08 MED ORDER — TRAMADOL HCL 50 MG PO TABS: 100.0000 mg | ORAL_TABLET | Freq: Four times a day (QID) | ORAL | 0 refills | Status: AC

## 2020-07-08 MED FILL — GABAPENTIN 400 MG CAPSULE: 400 | 16 days supply | Qty: 48 | Fill #0

## 2020-07-08 MED FILL — ELIQUIS 2.5 MG TABLET: 2.5 | 42 days supply | Qty: 84 | Fill #0

## 2020-07-08 MED FILL — BETHANECHOL 10 MG TABLET: 10 | 10 days supply | Qty: 30 | Fill #0

## 2020-07-08 MED FILL — traMADol HCL 50 MG TABS: 50 | 7 days supply | Qty: 56 | Fill #0

## 2020-07-08 MED FILL — oxyCODONE HCL 10 MG TABS: 10 | 7 days supply | Qty: 30 | Fill #0

## 2020-07-08 MED FILL — METHOCARBAMOL 500 MG TABS: 500 | 5 days supply | Qty: 30 | Fill #0
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
## 2020-07-08 NOTE — TOC Transition Note (Signed)
Transition of Care [facility]) - CM/SW Discharge Note   Patient Details  Name: Brenda Spencer MRN: [id number] Date of Birth: 06-Jan-1991  [facility] CM/SW Contact:  Ella Bodo, RN Phone Number: 07/08/2020, 1:29 PM   Clinical Narrative:   Notified by admissions coordinator of CIR that patient no longer eligible for admission and no bed availability currently.  Spoke with patient, and she expresses a desire to go home; she states that she will work out getting to outpatient rehab therapies.  She would prefer outpatient rehab at the [facility] location as it is closer to her home.  Will will make referral to outpatient rehab center in [place] for follow-up, and order all recommended DME.  Referral to [facility] for all needed equipment; DME to be delivered to bedside prior to discharge.  Patient states she is checking with her mother to see if she can be discharged this afternoon versus in a.m.    Final next level of care: OP Rehab Barriers to Discharge: Barriers Resolved                       Discharge Plan and Services   Discharge Planning Services: CM Consult            DME Arranged: 3-N-1, Geophysicist/field seismologist, Wheelchair manual DME Agency: [employer] Date DME Agency Contacted: 07/08/20 Time DME Agency Contacted: 4 Representative spoke with at DME Agency: [place] ([place]) Interventions     Readmission Risk Interventions Readmission Risk Prevention Plan 07/08/2020  Transportation Screening Complete  PCP or Specialist Appt within 5-7 Days Complete  Home Care Screening Complete  Medication Review (RN CM) Complete  Some recent data might be hidden   Reinaldo Raddle, RN, BSN  Trauma/Neuro ICU Case Manager [phone]

## 2020-07-08 NOTE — Plan of Care (Signed)
  Problem: Pain Managment: Goal: General experience of comfort will improve Outcome: Progressing   Problem: Safety: Goal: Ability to remain free from injury will improve Outcome: Progressing   Problem: Skin Integrity: Goal: Risk for impaired skin integrity will decrease Outcome: Progressing   

## 2020-07-08 NOTE — Progress Notes (Signed)
Provided discharge education/instructions, all questions and concerns addressed, Pt not in distress. meds from [place] and DME delivered to room. Pt to discharge home with belongings accompanied by family.
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
## 2020-07-08 NOTE — Plan of Care (Signed)
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
## 2020-07-09 ENCOUNTER — Telehealth: Payer: Self-pay | Admitting: *Deleted

## 2020-07-09 NOTE — Telephone Encounter (Signed)
Medicaid Managed Care team Transition of Care Assessment outreach attempt #1 made today. Unable to reach patient. HIPPA compliant voice message left requesting a return call. The patient has also been enrolled in an automated discharge follow up call series and will receive two outreach attempts for transition of care assessment. Contact information has been left for the patient and the Medicaid Managed Care team is available to provide assistance to the patient at any time.  ° °Katrice Amaris Delafuente, RN, BSN, CCRN °Patient Engagement Center °[phone] ° °
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
## 2020-07-14 ENCOUNTER — Other Ambulatory Visit: Payer: Self-pay

## 2020-07-14 ENCOUNTER — Emergency Department (HOSPITAL_COMMUNITY)
Admission: EM | Admit: 2020-07-14 | Discharge: 2020-07-14 | Disposition: A | Discharge status: Home / Self Care | Payer: Medicaid Other | Attending: Emergency Medicine | Admitting: Emergency Medicine

## 2020-07-14 DIAGNOSIS — Z5321 Procedure and treatment not carried out due to patient leaving prior to being seen by health care provider: Secondary | ICD-10-CM | POA: Diagnosis not present

## 2020-07-14 DIAGNOSIS — T7421XA Adult sexual abuse, confirmed, initial encounter: Secondary | ICD-10-CM | POA: Diagnosis present
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
## 2020-07-14 NOTE — SANE Note (Signed)
PT CONTINUES TO WAIT FOR ROOM PLACEMENT.  SPOKE WITH KAREN, WHO REPORTS THAT IT WILL BE "A WHILE"  BECAUSE THE ED IS FULL.

## 2020-07-15 ENCOUNTER — Telehealth: Payer: Self-pay | Admitting: *Deleted

## 2020-07-15 ENCOUNTER — Other Ambulatory Visit: Payer: Self-pay

## 2020-07-15 ENCOUNTER — Encounter (HOSPITAL_COMMUNITY): Payer: Self-pay | Admitting: Emergency Medicine

## 2020-07-15 DIAGNOSIS — W57XXXA Bitten or stung by nonvenomous insect and other nonvenomous arthropods, initial encounter: Secondary | ICD-10-CM | POA: Diagnosis not present

## 2020-07-15 DIAGNOSIS — Z5321 Procedure and treatment not carried out due to patient leaving prior to being seen by health care provider: Secondary | ICD-10-CM | POA: Insufficient documentation

## 2020-07-15 DIAGNOSIS — Y929 Unspecified place or not applicable: Secondary | ICD-10-CM | POA: Diagnosis not present

## 2020-07-15 DIAGNOSIS — Y939 Activity, unspecified: Secondary | ICD-10-CM | POA: Insufficient documentation

## 2020-07-15 DIAGNOSIS — S70361A Insect bite (nonvenomous), right thigh, initial encounter: Secondary | ICD-10-CM | POA: Insufficient documentation

## 2020-07-15 DIAGNOSIS — Y999 Unspecified external cause status: Secondary | ICD-10-CM | POA: Diagnosis not present

## 2020-07-15 NOTE — ED Triage Notes (Addendum)
Pt c/o spider bite to back of right thigh since this morning. Pt drowsy and falling asleep in triage.

## 2020-07-15 NOTE — Telephone Encounter (Signed)
Brenda Spencer presented to the ED and left before being seen by the provider on 07/14/20 The patient has been enrolled in an automated general discharge outreach program and 2 attempts to contact the patient will be made to follow up on their ED visit and subsequent needs. The care management team is available to provide assistance to this patient at any time.   Lenor Coffin, RN, BSN, [place] Patient [place] [phone]

## 2020-07-16 ENCOUNTER — Emergency Department (HOSPITAL_COMMUNITY)
Admission: EM | Admit: 2020-07-16 | Discharge: 2020-07-16 | Disposition: A | Discharge status: Home / Self Care | Payer: Medicaid Other | Attending: Emergency Medicine | Admitting: Emergency Medicine

## 2020-07-17 ENCOUNTER — Telehealth: Payer: Self-pay | Admitting: *Deleted

## 2020-07-17 NOTE — Telephone Encounter (Signed)
Brenda Spencer presented to the ED and left before being seen by the provider on 07/16/20. The patient has been enrolled in an automated general discharge outreach program and 2 attempts to contact the patient will be made to follow up on their ED visit and subsequent needs. The care management team is available to provide assistance to this patient at any time.   Lenor Coffin, RN, BSN, [place] Patient [place] [phone]

## 2020-07-22 ENCOUNTER — Encounter: Payer: Self-pay | Admitting: Family Medicine

## 2020-07-22 ENCOUNTER — Ambulatory Visit: Payer: Medicaid Other | Admitting: Family Medicine

## 2020-07-29 ENCOUNTER — Ambulatory Visit: Payer: No Typology Code available for payment source | Attending: General Surgery | Admitting: Physical Therapy

## 2020-08-01 ENCOUNTER — Telehealth: Payer: Self-pay | Admitting: Family Medicine

## 2020-08-01 NOTE — Telephone Encounter (Signed)
Pt missed her HFU on 07/22/20 and states she needs this appt to be r/s.

## 2020-08-05 NOTE — Telephone Encounter (Signed)
APPOINTMENT SCHEDULED

## 2020-08-11 ENCOUNTER — Encounter (HOSPITAL_COMMUNITY): Payer: Self-pay | Admitting: Emergency Medicine

## 2020-08-11 ENCOUNTER — Other Ambulatory Visit: Payer: Self-pay

## 2020-08-11 ENCOUNTER — Emergency Department (HOSPITAL_COMMUNITY)
Admission: EM | Admit: 2020-08-11 | Discharge: 2020-08-11 | Disposition: A | Discharge status: Home / Self Care | Payer: Medicaid Other | Attending: Emergency Medicine | Admitting: Emergency Medicine

## 2020-08-11 DIAGNOSIS — Z5321 Procedure and treatment not carried out due to patient leaving prior to being seen by health care provider: Secondary | ICD-10-CM | POA: Insufficient documentation

## 2020-08-11 DIAGNOSIS — M25532 Pain in left wrist: Secondary | ICD-10-CM | POA: Diagnosis present
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
## 2020-08-12 ENCOUNTER — Ambulatory Visit: Payer: Medicaid Other | Admitting: Nurse Practitioner

## 2020-08-14 ENCOUNTER — Other Ambulatory Visit: Payer: Self-pay

## 2020-08-14 ENCOUNTER — Encounter: Payer: Self-pay | Admitting: Family Medicine

## 2020-08-14 ENCOUNTER — Ambulatory Visit (INDEPENDENT_AMBULATORY_CARE_PROVIDER_SITE_OTHER): Payer: Medicaid Other | Admitting: Family Medicine

## 2020-08-14 VITALS — BP 125/88 | HR 119 | Temp 97.6°F | Ht 60.0 in | Wt 98.0 lb

## 2020-08-14 DIAGNOSIS — R202 Paresthesia of skin: Secondary | ICD-10-CM

## 2020-08-14 DIAGNOSIS — R200 Anesthesia of skin: Secondary | ICD-10-CM | POA: Diagnosis not present

## 2020-08-14 DIAGNOSIS — Z09 Encounter for follow-up examination after completed treatment for conditions other than malignant neoplasm: Secondary | ICD-10-CM | POA: Diagnosis not present

## 2020-08-14 DIAGNOSIS — Z4802 Encounter for removal of sutures: Secondary | ICD-10-CM | POA: Diagnosis not present

## 2020-08-14 NOTE — Patient Instructions (Addendum)
Renette Butters, MD. Schedule an appointment as soon as possible for a visit in 2 week(s).   Specialty: Orthopedic Surgery Contact information: [address] [place] [address] [phone]    Follow-up Information        Altamese [place], MD. Schedule an appointment as soon as possible for a visit in 2 week(s).   Specialty: Orthopedic Surgery Contact information: [place] [address] [phone]
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
## 2021-01-06 ENCOUNTER — Ambulatory Visit: Payer: Medicaid Other | Admitting: Family Medicine

## 2021-01-23 ENCOUNTER — Ambulatory Visit: Payer: Medicaid Other | Admitting: Nurse Practitioner

## 2021-02-04 ENCOUNTER — Ambulatory Visit: Payer: Medicaid Other | Admitting: Internal Medicine

## 2021-02-05 ENCOUNTER — Other Ambulatory Visit: Payer: Self-pay | Admitting: Orthopedic Surgery

## 2021-02-05 DIAGNOSIS — S32811D Multiple fractures of pelvis with unstable disruption of pelvic ring, subsequent encounter for fracture with routine healing: Secondary | ICD-10-CM

## 2021-02-21 ENCOUNTER — Other Ambulatory Visit: Payer: Medicaid Other

## 2021-02-28 ENCOUNTER — Ambulatory Visit: Payer: Medicaid Other | Admitting: Internal Medicine

## 2021-03-11 ENCOUNTER — Other Ambulatory Visit: Payer: Medicaid Other

## 2021-09-28 ENCOUNTER — Emergency Department (HOSPITAL_COMMUNITY): Payer: Medicaid Other

## 2021-09-28 ENCOUNTER — Emergency Department (HOSPITAL_COMMUNITY)
Admission: EM | Admit: 2021-09-28 | Discharge: 2021-09-28 | Disposition: A | Discharge status: Home / Self Care | Payer: Medicaid Other | Attending: Emergency Medicine | Admitting: Emergency Medicine

## 2021-09-28 ENCOUNTER — Encounter (HOSPITAL_COMMUNITY): Payer: Self-pay | Admitting: *Deleted

## 2021-09-28 ENCOUNTER — Other Ambulatory Visit: Payer: Self-pay

## 2021-09-28 DIAGNOSIS — R0781 Pleurodynia: Secondary | ICD-10-CM | POA: Insufficient documentation

## 2021-09-28 DIAGNOSIS — S20212A Contusion of left front wall of thorax, initial encounter: Secondary | ICD-10-CM

## 2021-09-28 DIAGNOSIS — I509 Heart failure, unspecified: Secondary | ICD-10-CM | POA: Diagnosis not present

## 2021-09-28 DIAGNOSIS — J4530 Mild persistent asthma, uncomplicated: Secondary | ICD-10-CM | POA: Insufficient documentation

## 2021-09-28 MED ORDER — CELECOXIB 200 MG PO CAPS: 200.0000 mg | ORAL_CAPSULE | Freq: Two times a day (BID) | ORAL | 0 refills | Status: AC

## 2021-09-28 NOTE — Discharge Instructions (Signed)
Contact a health care provider if you have: Increased bruising or swelling. Pain that is not controlled with treatment. A fever. Get help right away if you: Have difficulty breathing or shortness of breath. Develop a continual cough, or you cough up thick or bloody mucus from your lungs (sputum). Feel nauseous or you vomit. Have pain in your abdomen. 
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
## 2022-04-20 ENCOUNTER — Other Ambulatory Visit: Payer: Self-pay | Admitting: Orthopedic Surgery

## 2022-04-20 DIAGNOSIS — S32810K Multiple fractures of pelvis with stable disruption of pelvic ring, subsequent encounter for fracture with nonunion: Secondary | ICD-10-CM

## 2022-04-24 ENCOUNTER — Ambulatory Visit
Admission: RE | Admit: 2022-04-24 | Discharge: 2022-04-24 | Disposition: A | Discharge status: Home / Self Care | Payer: Medicaid Other | Source: Ambulatory Visit | Attending: Orthopedic Surgery | Admitting: Orthopedic Surgery

## 2022-04-24 DIAGNOSIS — S32810K Multiple fractures of pelvis with stable disruption of pelvic ring, subsequent encounter for fracture with nonunion: Secondary | ICD-10-CM

## 2022-05-01 ENCOUNTER — Other Ambulatory Visit: Payer: Self-pay | Admitting: Orthopedic Surgery

## 2022-05-01 DIAGNOSIS — M8448XP Pathological fracture, other site, subsequent encounter for fracture with malunion: Secondary | ICD-10-CM

## 2022-05-01 DIAGNOSIS — M5416 Radiculopathy, lumbar region: Secondary | ICD-10-CM

## 2022-05-05 ENCOUNTER — Encounter (HOSPITAL_BASED_OUTPATIENT_CLINIC_OR_DEPARTMENT_OTHER): Payer: Self-pay

## 2022-05-05 ENCOUNTER — Other Ambulatory Visit: Payer: Self-pay

## 2022-05-05 ENCOUNTER — Emergency Department (HOSPITAL_BASED_OUTPATIENT_CLINIC_OR_DEPARTMENT_OTHER)
Admission: EM | Admit: 2022-05-05 | Discharge: 2022-05-05 | Disposition: A | Discharge status: Home / Self Care | Payer: Medicaid Other | Attending: Emergency Medicine | Admitting: Emergency Medicine

## 2022-05-05 DIAGNOSIS — R102 Pelvic and perineal pain: Secondary | ICD-10-CM | POA: Insufficient documentation

## 2022-05-05 DIAGNOSIS — R202 Paresthesia of skin: Secondary | ICD-10-CM | POA: Diagnosis present

## 2022-05-05 DIAGNOSIS — M5416 Radiculopathy, lumbar region: Secondary | ICD-10-CM | POA: Diagnosis not present

## 2022-05-05 LAB — URINALYSIS, ROUTINE W REFLEX MICROSCOPIC
Bilirubin Urine: NEGATIVE
Glucose, UA: NEGATIVE mg/dL
Hgb urine dipstick: NEGATIVE
Ketones, ur: NEGATIVE mg/dL
Nitrite: NEGATIVE
Specific Gravity, Urine: 1.029 (ref 1.005–1.030)
pH: 5.5 (ref 5.0–8.0)

## 2022-05-05 MED ORDER — OXYCODONE-ACETAMINOPHEN 5-325 MG PO TABS
1.0000 | ORAL_TABLET | Freq: Once | ORAL | Status: AC
  Administered 2022-05-05: 1 via ORAL
  Filled 2022-05-05: qty 1

## 2022-05-05 MED ORDER — METHYLPREDNISOLONE 4 MG PO TBPK: ORAL_TABLET | ORAL | 0 refills | Status: DC
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
## 2022-05-05 NOTE — Discharge Instructions (Signed)
Please pick up your medications including the steroids prescribed today and the Lyrica prescribed last week  by Dr. Marcelino Scot and begin taking as prescribed.   Keep appointment for MRI scheduled in 1 weeks time.   Your urine did have some bacteria in it and we have sent it for culture. We will call you in 2-3 days time if it grows anything concerning for a urinary tract infection.   Return to the ED for any new/worsening symptoms
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
## 2022-05-06 LAB — URINE CULTURE: Culture: <10000 — AB

## 2022-05-14 ENCOUNTER — Ambulatory Visit
Admission: RE | Admit: 2022-05-14 | Discharge: 2022-05-14 | Disposition: A | Discharge status: Home / Self Care | Payer: Medicaid Other | Source: Ambulatory Visit | Attending: Orthopedic Surgery | Admitting: Orthopedic Surgery

## 2022-05-14 DIAGNOSIS — M8448XP Pathological fracture, other site, subsequent encounter for fracture with malunion: Secondary | ICD-10-CM

## 2022-05-14 DIAGNOSIS — M5416 Radiculopathy, lumbar region: Secondary | ICD-10-CM

## 2022-05-21 ENCOUNTER — Ambulatory Visit: Payer: Medicaid Other | Admitting: Registered Nurse

## 2022-06-15 ENCOUNTER — Emergency Department (HOSPITAL_BASED_OUTPATIENT_CLINIC_OR_DEPARTMENT_OTHER)
Admission: EM | Admit: 2022-06-15 | Discharge: 2022-06-15 | Discharge status: Against Medical Advice | Payer: Medicaid Other | Attending: Emergency Medicine | Admitting: Emergency Medicine

## 2022-06-15 ENCOUNTER — Encounter (HOSPITAL_BASED_OUTPATIENT_CLINIC_OR_DEPARTMENT_OTHER): Payer: Self-pay

## 2022-06-15 ENCOUNTER — Other Ambulatory Visit: Payer: Self-pay

## 2022-06-15 DIAGNOSIS — Z20822 Contact with and (suspected) exposure to covid-19: Secondary | ICD-10-CM | POA: Diagnosis not present

## 2022-06-15 DIAGNOSIS — J029 Acute pharyngitis, unspecified: Secondary | ICD-10-CM | POA: Insufficient documentation

## 2022-06-15 DIAGNOSIS — Z5321 Procedure and treatment not carried out due to patient leaving prior to being seen by health care provider: Secondary | ICD-10-CM | POA: Diagnosis not present

## 2022-06-15 DIAGNOSIS — H9201 Otalgia, right ear: Secondary | ICD-10-CM | POA: Diagnosis not present

## 2022-06-15 LAB — RESP PANEL BY RT-PCR (FLU A&B, COVID) ARPGX2
Influenza A by PCR: NEGATIVE
Influenza B by PCR: NEGATIVE
SARS Coronavirus 2 by RT PCR: NEGATIVE

## 2022-06-15 LAB — GROUP A STREP BY PCR: Group A Strep by PCR: NOT DETECTED

## 2022-06-15 NOTE — ED Triage Notes (Signed)
Patient here POV from Home.  Endorses Pain to Right Ear and Throat that began 2 Days PTA. Constant. No Drainage from Ear.   No Fevers. No Congestion. No Body Aches.   NAD Noted during Triage. A&Ox4. GCS 15. Ambulatory.

## 2022-06-15 NOTE — ED Notes (Signed)
Pt was seen leaving by registration

## 2022-06-24 ENCOUNTER — Ambulatory Visit: Payer: Medicaid Other

## 2022-07-01 ENCOUNTER — Emergency Department (HOSPITAL_BASED_OUTPATIENT_CLINIC_OR_DEPARTMENT_OTHER)
Admission: EM | Admit: 2022-07-01 | Discharge: 2022-07-01 | Disposition: A | Discharge status: Home / Self Care | Payer: Medicaid Other | Attending: Emergency Medicine | Admitting: Emergency Medicine

## 2022-07-01 ENCOUNTER — Encounter (HOSPITAL_BASED_OUTPATIENT_CLINIC_OR_DEPARTMENT_OTHER): Payer: Self-pay | Admitting: Emergency Medicine

## 2022-07-01 ENCOUNTER — Other Ambulatory Visit: Payer: Self-pay

## 2022-07-01 DIAGNOSIS — M5442 Lumbago with sciatica, left side: Secondary | ICD-10-CM | POA: Insufficient documentation

## 2022-07-01 DIAGNOSIS — M545 Low back pain, unspecified: Secondary | ICD-10-CM | POA: Diagnosis present

## 2022-07-01 MED ORDER — OXYCODONE HCL 5 MG PO TABS: 5.0000 mg | ORAL_TABLET | ORAL | 0 refills | Status: AC | PRN

## 2022-07-01 MED ORDER — METHYLPREDNISOLONE 4 MG PO TBPK: ORAL_TABLET | ORAL | 0 refills | Status: AC

## 2022-07-01 MED ORDER — HYDROMORPHONE HCL 1 MG/ML IJ SOLN
1.0000 mg | Freq: Once | INTRAMUSCULAR | Status: AC
  Administered 2022-07-01: 1 mg via INTRAMUSCULAR
  Filled 2022-07-01: qty 1
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
## 2022-07-27 ENCOUNTER — Emergency Department (HOSPITAL_BASED_OUTPATIENT_CLINIC_OR_DEPARTMENT_OTHER): Payer: Medicaid Other

## 2022-07-27 ENCOUNTER — Encounter (HOSPITAL_BASED_OUTPATIENT_CLINIC_OR_DEPARTMENT_OTHER): Payer: Self-pay

## 2022-07-27 ENCOUNTER — Emergency Department (HOSPITAL_BASED_OUTPATIENT_CLINIC_OR_DEPARTMENT_OTHER)
Admission: EM | Admit: 2022-07-27 | Discharge: 2022-07-28 | Disposition: A | Discharge status: Home / Self Care | Payer: Medicaid Other | Attending: Emergency Medicine | Admitting: Emergency Medicine

## 2022-07-27 ENCOUNTER — Other Ambulatory Visit: Payer: Self-pay

## 2022-07-27 DIAGNOSIS — M25552 Pain in left hip: Secondary | ICD-10-CM | POA: Insufficient documentation

## 2022-07-27 DIAGNOSIS — R102 Pelvic and perineal pain: Secondary | ICD-10-CM | POA: Insufficient documentation

## 2022-07-27 DIAGNOSIS — G8929 Other chronic pain: Secondary | ICD-10-CM

## 2022-07-27 DIAGNOSIS — X509XXA Other and unspecified overexertion or strenuous movements or postures, initial encounter: Secondary | ICD-10-CM | POA: Diagnosis not present

## 2022-07-27 LAB — URINALYSIS, ROUTINE W REFLEX MICROSCOPIC
Bilirubin Urine: NEGATIVE
Glucose, UA: NEGATIVE mg/dL
Ketones, ur: NEGATIVE mg/dL
Nitrite: NEGATIVE
Protein, ur: NEGATIVE mg/dL
Specific Gravity, Urine: >=1.03 (ref 1.005–1.030)
pH: 5.5 (ref 5.0–8.0)

## 2022-07-27 LAB — PREGNANCY, URINE: Preg Test, Ur: NEGATIVE

## 2022-07-27 LAB — URINALYSIS, MICROSCOPIC (REFLEX)
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
## 2022-07-28 MED ORDER — KETOROLAC TROMETHAMINE 30 MG/ML IJ SOLN
30.0000 mg | Freq: Once | INTRAMUSCULAR | Status: AC
  Administered 2022-07-28: 30 mg via INTRAMUSCULAR
  Filled 2022-07-28: qty 1

## 2022-07-28 NOTE — ED Notes (Signed)
Pt c/o increased pelvic pain, tearful. EDP Karle Starch made aware. Pt provided with ice pack for left hip.  Call bell within reach, will continue to monitor.
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
## 2022-11-07 DIAGNOSIS — E0590 Thyrotoxicosis, unspecified without thyrotoxic crisis or storm: Secondary | ICD-10-CM | POA: Insufficient documentation

## 2023-05-27 ENCOUNTER — Emergency Department (HOSPITAL_BASED_OUTPATIENT_CLINIC_OR_DEPARTMENT_OTHER)
Admission: EM | Admit: 2023-05-27 | Discharge: 2023-05-27 | Disposition: A | Discharge status: Home / Self Care | Payer: Medicaid Other | Attending: Emergency Medicine | Admitting: Emergency Medicine

## 2023-05-27 ENCOUNTER — Other Ambulatory Visit: Payer: Self-pay

## 2023-05-27 ENCOUNTER — Encounter (HOSPITAL_BASED_OUTPATIENT_CLINIC_OR_DEPARTMENT_OTHER): Payer: Self-pay | Admitting: Urology

## 2023-05-27 ENCOUNTER — Emergency Department (HOSPITAL_BASED_OUTPATIENT_CLINIC_OR_DEPARTMENT_OTHER): Payer: Medicaid Other

## 2023-05-27 DIAGNOSIS — R5383 Other fatigue: Secondary | ICD-10-CM | POA: Insufficient documentation

## 2023-05-27 DIAGNOSIS — Z1152 Encounter for screening for COVID-19: Secondary | ICD-10-CM | POA: Insufficient documentation

## 2023-05-27 DIAGNOSIS — I509 Heart failure, unspecified: Secondary | ICD-10-CM | POA: Diagnosis not present

## 2023-05-27 DIAGNOSIS — D72829 Elevated white blood cell count, unspecified: Secondary | ICD-10-CM | POA: Diagnosis not present

## 2023-05-27 DIAGNOSIS — J45909 Unspecified asthma, uncomplicated: Secondary | ICD-10-CM | POA: Insufficient documentation

## 2023-05-27 DIAGNOSIS — E039 Hypothyroidism, unspecified: Secondary | ICD-10-CM | POA: Insufficient documentation

## 2023-05-27 DIAGNOSIS — D75839 Thrombocytosis, unspecified: Secondary | ICD-10-CM | POA: Insufficient documentation

## 2023-05-27 DIAGNOSIS — L539 Erythematous condition, unspecified: Secondary | ICD-10-CM | POA: Insufficient documentation

## 2023-05-27 DIAGNOSIS — G43909 Migraine, unspecified, not intractable, without status migrainosus: Secondary | ICD-10-CM | POA: Insufficient documentation

## 2023-05-27 LAB — BASIC METABOLIC PANEL
Anion gap: 8 (ref 5–15)
BUN: 12 mg/dL (ref 6–20)
CO2: 26 mmol/L (ref 22–32)
Calcium: 9.2 mg/dL (ref 8.9–10.3)
Chloride: 106 mmol/L (ref 98–111)
Creatinine, Ser: 0.71 mg/dL (ref 0.44–1.00)
GFR, Estimated: >60 mL/min (ref >60)
Glucose, Bld: 73 mg/dL (ref 70–99)
Potassium: 4 mmol/L (ref 3.5–5.1)
Sodium: 140 mmol/L (ref 135–145)

## 2023-05-27 LAB — URINALYSIS, MICROSCOPIC (REFLEX)

## 2023-05-27 LAB — CBC
HCT: 35.9 % — ABNORMAL LOW (ref 36.0–46.0)
Hemoglobin: 10.5 g/dL — ABNORMAL LOW (ref 12.0–15.0)
MCH: 20.6 pg — ABNORMAL LOW (ref 26.0–34.0)
MCHC: 29.2 g/dL — ABNORMAL LOW (ref 30.0–36.0)
MCV: 70.4 fL — ABNORMAL LOW (ref 80.0–100.0)
Platelets: 463 10*3/uL — ABNORMAL HIGH (ref 150–400)
RBC: 5.1 MIL/uL (ref 3.87–5.11)
RDW: 18.6 % — ABNORMAL HIGH (ref 11.5–15.5)
WBC: 10.7 10*3/uL — ABNORMAL HIGH (ref 4.0–10.5)
nRBC: 0 % (ref 0.0–0.2)

## 2023-05-27 LAB — RESP PANEL BY RT-PCR (RSV, FLU A&B, COVID)  RVPGX2
Influenza A by PCR: NEGATIVE
Influenza B by PCR: NEGATIVE
Resp Syncytial Virus by PCR: NEGATIVE
SARS Coronavirus 2 by RT PCR: NEGATIVE

## 2023-05-27 LAB — RAPID URINE DRUG SCREEN, HOSP PERFORMED
Amphetamines: POSITIVE — AB
Barbiturates: NOT DETECTED
Benzodiazepines: NOT DETECTED
Cocaine: NOT DETECTED
Opiates: NOT DETECTED
Tetrahydrocannabinol: NOT DETECTED

## 2023-05-27 LAB — URINALYSIS, ROUTINE W REFLEX MICROSCOPIC
Glucose, UA: NEGATIVE mg/dL
Hgb urine dipstick: NEGATIVE
Ketones, ur: 15 mg/dL — AB
Nitrite: NEGATIVE
Protein, ur: 30 mg/dL — AB
Specific Gravity, Urine: >=1.03 (ref 1.005–1.030)
pH: 5.5 (ref 5.0–8.0)

## 2023-05-27 LAB — PREGNANCY, URINE: Preg Test, Ur: NEGATIVE

## 2023-05-27 MED ORDER — CEPHALEXIN 250 MG PO CAPS
500.0000 mg | ORAL_CAPSULE | Freq: Once | ORAL | Status: AC
  Administered 2023-05-27: 500 mg via ORAL
  Filled 2023-05-27: qty 2

## 2023-05-27 MED ORDER — CEPHALEXIN 500 MG PO CAPS: 500.0000 mg | ORAL_CAPSULE | Freq: Four times a day (QID) | ORAL | 0 refills | Status: AC

## 2023-05-27 MED ORDER — LACTATED RINGERS IV BOLUS
1000.0000 mL | Freq: Once | INTRAVENOUS | Status: AC
  Administered 2023-05-27: 1000 mL via INTRAVENOUS

## 2023-05-27 NOTE — Discharge Instructions (Addendum)
Thank you for coming to [facility] Emergency Department. You were seen for redness to your feet as well as fatigue. We did an exam, labs, and imaging, and these showed possible infection of the skin and high platelet count. You will be treated with Keflex four times per day for 5 days. You may also have a condition called erythromelalgia, which can be evaluated by your primary care physician. Please follow up with your primary care provider within 1 week.   Do not hesitate to return to the ED or call 911 if you experience: -Worsening symptoms -Severe pain or numbness/tingling in your feet or hands -Lightheadedness, passing out -Fevers/chills -Anything else that concerns you

## 2023-05-27 NOTE — ED Triage Notes (Signed)
Pt states has been fatigued and SOB with exertion x 1 week but getting worse  States redness to bilateral feet that started yesterday  Also states lower back pain
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
## 2023-06-23 ENCOUNTER — Other Ambulatory Visit: Payer: Self-pay | Admitting: Physician Assistant

## 2023-06-23 DIAGNOSIS — R7989 Other specified abnormal findings of blood chemistry: Secondary | ICD-10-CM

## 2023-06-24 ENCOUNTER — Ambulatory Visit
Admission: RE | Admit: 2023-06-24 | Discharge: 2023-06-24 | Discharge status: Home / Self Care | Payer: MEDICAID | Source: Ambulatory Visit | Attending: Physician Assistant | Admitting: Physician Assistant

## 2023-06-24 DIAGNOSIS — R7989 Other specified abnormal findings of blood chemistry: Secondary | ICD-10-CM

## 2023-06-28 DIAGNOSIS — F411 Generalized anxiety disorder: Secondary | ICD-10-CM | POA: Diagnosis not present

## 2023-06-28 DIAGNOSIS — J45909 Unspecified asthma, uncomplicated: Secondary | ICD-10-CM | POA: Diagnosis not present

## 2023-06-28 DIAGNOSIS — M545 Low back pain, unspecified: Secondary | ICD-10-CM | POA: Diagnosis not present

## 2023-06-28 DIAGNOSIS — Z79899 Other long term (current) drug therapy: Secondary | ICD-10-CM | POA: Diagnosis not present

## 2023-07-07 ENCOUNTER — Inpatient Hospital Stay: Payer: 59

## 2023-07-07 ENCOUNTER — Inpatient Hospital Stay: Payer: 59 | Attending: Hematology and Oncology | Admitting: Hematology and Oncology

## 2023-07-07 ENCOUNTER — Other Ambulatory Visit: Payer: Self-pay

## 2023-07-07 VITALS — BP 107/78 | HR 97 | Temp 97.9°F | Resp 18 | Ht 60.0 in | Wt 133.0 lb

## 2023-07-07 DIAGNOSIS — D509 Iron deficiency anemia, unspecified: Secondary | ICD-10-CM | POA: Diagnosis present

## 2023-07-07 DIAGNOSIS — D500 Iron deficiency anemia secondary to blood loss (chronic): Secondary | ICD-10-CM

## 2023-07-07 NOTE — Progress Notes (Signed)
[facility] CONSULT NOTE  Patient Care Team: Inc, [facility] as PCP - General (Pediatrics)  CHIEF COMPLAINTS/PURPOSE OF CONSULTATION:  Iron deficiency anemia  ASSESSMENT & PLAN:   This is a very pleasant 32 year old premenopausal female patient with past medical history significant for anxiety, hepatitis C referred to hematology for evaluation and recommendations regarding severe iron deficiency anemia.   Iron deficiency anemia affects a large proportion of the wellness population especially females of childbearing age and children.  Common causes of iron deficiency include blood loss, reduced iron absorption because of previous surgeries, dietary restrictions or other malabsorption issues, medications that reduce gastric acidity are due to inherited disorders such as IRIDA due to TMPRSS6 mutation. Symptoms of iron deficiency usually include fatigue, pica, restless legs, exercise intolerance, exertional dyspnea, headaches and weakness. Diagnosis can be usually made by history, physical examination, CBC and iron studies.  We generally treat iron deficiency anemia with oral supplements if this can be tolerated.  IV iron is appropriate for patients are unable to tolerate due to gastrointestinal side effects or for patients with severe/ongoing blood loss, history of gastric bypass which reduces gastric acid and henceforth will impair intestinal absorption of oral iron, malabsorption syndromes are occasional in pregnancy. There are several formularies of intravenous iron available in the market.  We have discussed about risk of allergic/infusion reactions including potentially life-threatening anaphylaxis with intravenous iron however these serious allergic reactions are exceedingly rare and overestimated.   In contrast to serious allergic reactions, IV iron may be associated with nonallergic infusion reactions including self-limited urticaria, palpitations,  dizziness, neck and back spasm which again occur in less than 1% of the individuals and do not progress to more serious reactions.   Patient is unable to tolerate oral iron supplementation because of nausea and she says they do not work because she has been taking it for about a year.  Her most recent labs showed a ferritin of 15, hemoglobin of 8.6 consistent with moderate to severe anemia.  Given her intolerance to oral iron, we have discussed about IV iron today as mentioned above.  She is willing to proceed. She can RTC in 3 months with repeat labs.   HISTORY OF PRESENTING ILLNESS:  Brenda Spencer 32 y.o. female is here because of IDA  This is a 32 year old female patient with past medical history significant for anxiety, hepatitis C referred to hematology for evaluation and recommendations regarding iron deficiency anemia.  Patient arrived to the appointment today by herself.  She tells me that she is feeling fatigued, does not want to get out of bed, has been feeling dizzy and has been trying to take oral iron for the past year without much success.  She has been taking it for a year however is feeling quite nauseated and most recent labs did not show significant improvement hence she was recommended to see [place] for consideration of IV iron.  She does report heavy menstrual cycles.  She changes her pad/tampon every 2 hours and this flow continues for about 5 days.  No other hematochezia or melena.  She had 4 children, youngest is 71 years old.  No history of breast-feeding.  Rest of the pertinent 10 point ROS reviewed and negative  MEDICAL HISTORY:  Past Medical History:  Diagnosis Date   Acute post-traumatic headache, not intractable 01/19/2016   Asthma    ALbuterol inhaler as needed   CHF (congestive heart failure) (HCC) 2011   RESOLVED; HAD CARDIAC  EVAL    Chronic back pain    reason unknown   Congestive heart failure (HCC) 2011   related to preeclampsia   Nocturia    Noncompliance     Postgastrectomy malabsorption     SURGICAL HISTORY: Past Surgical History:  Procedure Laterality Date   BREAST ENHANCEMENT SURGERY     CHOLECYSTECTOMY     teenager   OPEN REDUCTION INTERNAL FIXATION (ORIF) DISTAL RADIAL FRACTURE Left 07/01/2020   Procedure: OPEN REDUCTION INTERNAL FIXATION (ORIF) DISTAL RADIAL FRACTURE;  Surgeon: Sheral Apley, MD;  Location: [facility] OR;  Service: Orthopedics;  Laterality: Left;   SACRO-ILIAC PINNING Left 07/01/2020   Procedure: Loyal Gambler;  Surgeon: Myrene Galas, MD;  Location: [facility] OR;  Service: Orthopedics;  Laterality: Left;   SLEEVE GASTROPLASTY  01/18/2017   SUBOCCIPITAL CRANIECTOMY CERVICAL LAMINECTOMY N/A 06/09/2016   Procedure: Chiari Decompression;  Surgeon: Lisbeth Renshaw, MD;  Location: [facility] NEURO ORS;  Service: Neurosurgery;  Laterality: N/A;  posterior/occipital   TEAR DUCT PROBING  as a child   TONSILLECTOMY     as a teenager   TUBAL LIGATION Bilateral 09/10/2015   Procedure: POST PARTUM TUBAL LIGATION;  Surgeon: Catalina Antigua, MD;  Location: [facility] ORS;  Service: Gynecology;  Laterality: Bilateral;   WISDOM TOOTH EXTRACTION     as a teenager    SOCIAL HISTORY: Social History   Socioeconomic History   Marital status: Single    Spouse name: CHRISTOPHER   Number of children: 1   Years of education: 12   Highest education level: Not on file  Occupational History   Occupation: HOMEMAKER  Tobacco Use   Smoking status: Never   Smokeless tobacco: Never  Vaping Use   Vaping status: Never Used  Substance and Sexual Activity   Alcohol use: No    Alcohol/week: 0.0 standard drinks of alcohol   Drug use: Not Currently   Sexual activity: Yes    Partners: Male    Birth control/protection: Surgical  Other Topics Concern   Not on file  Social History Narrative   3-4 cups of tea and soda a day    Social Determinants of Health   Financial Resource Strain: Not on File (06/05/2022)   Received from [employer], [employer]    Financial Resource Strain: 0  Food Insecurity: Low Risk  (06/29/2023)   Received from [facility]   Food vital sign    Within the past 12 months, you worried that your food would run out before you got money to buy more: Never true    Within the past 12 months, the food you bought just didn't last and you didn't have money to get more. : Never true  Transportation Needs: Not on file (06/29/2023)  Physical Activity: Not on File (06/05/2022)   Received from [place], [place]   Physical Activity    Physical Activity: 0  Stress: Not on File (06/05/2022)   Received from [facility], [place]   Stress    Stress: 0  Social Connections: Not on File (06/05/2022)   Received from [place], [place]   Social Connections    Social Connections and Isolation: 0  Intimate Partner Violence: Unknown (03/17/2022)   Received from [facility]   HITS    Physically Hurt: Not on file    Insult or Talk Down To: Not on file    Threaten Physical Harm: Not on file    Scream or Curse: Not on file    FAMILY HISTORY: Family History  Problem Relation Age of Onset   Learning disabilities Other    Colon cancer Maternal Grandmother    Cancer Maternal Grandmother        colon   Asthma Brother    Learning disabilities Brother    Asthma Daughter    Learning disabilities Daughter    Seizures Daughter    Chromosomal disorder Daughter        1q21.1 microdeletion   Cataracts Daughter        congenital   Anesthesia problems Neg Hx    Other Neg Hx     ALLERGIES:  is allergic to metoclopramide, ondansetron, and aspirin.  MEDICATIONS:  Current Outpatient Medications  Medication Sig Dispense Refill   acetaminophen (TYLENOL) 500 MG tablet Take 2 tablets (1,000 mg total) by mouth every 6 (six) hours. 30 tablet 0   ARIPiprazole (ABILIFY) 5 MG tablet Take 5 mg by mouth daily.     celecoxib (CELEBREX) 200 MG capsule Take 1 capsule (200 mg total) by mouth 2 (two) times daily. 20 capsule 0   cephALEXin (KEFLEX) 500  MG capsule Take 1 capsule (500 mg total) by mouth 4 (four) times daily. 20 capsule 0   cyclobenzaprine (FLEXERIL) 10 MG tablet Take by mouth.     ibuprofen (ADVIL) 200 MG tablet Take 200-800 mg by mouth every 6 (six) hours as needed for fever or moderate pain.     methylPREDNISolone (MEDROL DOSEPAK) 4 MG TBPK tablet Follow package insert 21 each 0   oxyCODONE (ROXICODONE) 5 MG immediate release tablet Take 1 tablet (5 mg total) by mouth every 4 (four) hours as needed for up to 15 doses for breakthrough pain. 15 tablet 0   pregabalin (LYRICA) 100 MG capsule Take 100 mg by mouth every 8 (eight) hours.     QUEtiapine (SEROQUEL) 25 MG tablet See admin instructions. Take 1/2 tablet at bedtime for sleep for 4 nights,then can increase to 1 tablet at bedtime     tiaGABine (GABITRIL) 4 MG tablet See admin instructions. Take 1/2 tablet twice daily for 7 days,if needed & tolerated increase to 1 tablet twice daily     No current facility-administered medications for this visit.     PHYSICAL EXAMINATION: ECOG PERFORMANCE STATUS: 0 - Asymptomatic  Vitals:   07/07/23 1001  BP: 107/78  Pulse: 97  Resp: 18  Temp: 97.9 F (36.6 C)  SpO2: 100%   Filed Weights   07/07/23 1001  Weight: 133 lb (60.3 kg)    GENERAL:alert, no distress and comfortable SKIN: skin pallor noted. LYMPH:  no palpable lymphadenopathy in the cervical, axillary LUNGS: clear to auscultation and percussion with normal breathing effort HEART: regular rate & rhythm and no murmurs and no lower extremity edema ABDOMEN:abdomen soft, non-tender and normal bowel sounds Musculoskeletal:no cyanosis of digits and no clubbing  PSYCH: alert & oriented x 3 with fluent speech NEURO: no focal motor/sensory deficits  LABORATORY DATA:  I have reviewed the data as listed Lab Results  Component Value Date   WBC 10.7 (H) 05/27/2023   HGB 10.5 (L) 05/27/2023   HCT 35.9 (L) 05/27/2023   MCV 70.4 (L) 05/27/2023   PLT 463 (H) 05/27/2023      Chemistry      Component Value Date/Time   NA 140 05/27/2023 1033   NA 141 05/06/2018 1145   K 4.0 05/27/2023 1033   CL 106 05/27/2023 1033   CO2 26 05/27/2023 1033   BUN 12 05/27/2023 1033   BUN 8 05/06/2018 1145  CREATININE 0.71 05/27/2023 1033   CREATININE 0.36 (L) 04/01/2015 1356      Component Value Date/Time   CALCIUM 9.2 05/27/2023 1033   ALKPHOS 69 06/29/2020 0840   AST 36 06/29/2020 0840   ALT 24 06/29/2020 0840   BILITOT 1.5 (H) 06/29/2020 0840   BILITOT 0.3 11/26/2017 1323       RADIOGRAPHIC STUDIES: I have personally reviewed the radiological images as listed and agreed with the findings in the report. US Abdomen Limited RUQ (LIVER/GB)  Result Date: 06/24/2023 CLINICAL DATA:  Elevated liver function tests EXAM: ULTRASOUND ABDOMEN LIMITED RIGHT UPPER QUADRANT COMPARISON:  CT abdomen pelvis 06/16/2019 FINDINGS: Gallbladder: Status post cholecystectomy Common bile duct: Diameter: 3 mm Liver: Parenchymal echogenicity: Diffusely increased Contours: Normal Lesions: None Portal vein: Patent.  Hepatopetal flow Other: None. IMPRESSION: Diffuse increased echogenicity of the hepatic parenchyma is a nonspecific indicator of hepatocellular dysfunction, most commonly steatosis. Electronically Signed   By: Acquanetta Belling M.D.   On: 06/24/2023 11:50    All questions were answered. The patient knows to call the clinic with any problems, questions or concerns. I spent 45 minutes in the care of this patient including H and P, review of records, counseling and coordination of care.     Rachel Moulds, MD 07/07/2023 10:13 AM

## 2023-07-09 ENCOUNTER — Encounter: Payer: Self-pay | Admitting: Hematology and Oncology

## 2023-07-09 ENCOUNTER — Other Ambulatory Visit (HOSPITAL_COMMUNITY): Payer: Self-pay

## 2023-07-09 ENCOUNTER — Telehealth: Payer: Self-pay | Admitting: Pharmacy Technician

## 2023-07-09 NOTE — Telephone Encounter (Signed)
[facility] Patient Product/process development scientist completed.    The patient is insured through [employer]. Mavyret was filled 06/30/23 - pharmacy phone # [phone], next fill eligible 07/21/23  Will continue to follow to see if copay assistance is needed.

## 2023-07-12 ENCOUNTER — Encounter: Payer: MEDICAID | Admitting: Family

## 2023-07-16 ENCOUNTER — Inpatient Hospital Stay: Payer: 59 | Attending: Hematology and Oncology

## 2023-07-16 ENCOUNTER — Other Ambulatory Visit: Payer: Self-pay

## 2023-07-16 VITALS — BP 109/73 | HR 90 | Temp 98.4°F | Resp 18

## 2023-07-16 DIAGNOSIS — Z79899 Other long term (current) drug therapy: Secondary | ICD-10-CM | POA: Diagnosis not present

## 2023-07-16 DIAGNOSIS — D62 Acute posthemorrhagic anemia: Secondary | ICD-10-CM

## 2023-07-16 DIAGNOSIS — D509 Iron deficiency anemia, unspecified: Secondary | ICD-10-CM | POA: Insufficient documentation

## 2023-07-16 MED ORDER — SODIUM CHLORIDE 0.9 % IV SOLN
300.0000 mg | Freq: Once | INTRAVENOUS | Status: AC
  Administered 2023-07-16: 300 mg via INTRAVENOUS
  Filled 2023-07-16: qty 300

## 2023-07-16 MED ORDER — SODIUM CHLORIDE 0.9 % IV SOLN: Freq: Once | INTRAVENOUS | Status: AC

## 2023-07-16 NOTE — Patient Instructions (Signed)
Iron Sucrose Injection What is this medication? IRON SUCROSE (EYE ern SOO krose) treats low levels of iron (iron deficiency anemia) in people with kidney disease. Iron is a mineral that plays an important role in making red blood cells, which carry oxygen from your lungs to the rest of your body. This medicine may be used for other purposes; ask your health care provider or pharmacist if you have questions. COMMON BRAND NAME(S): Venofer What should I tell my care team before I take this medication? They need to know if you have any of these conditions: Anemia not caused by low iron levels Heart disease High levels of iron in the blood Kidney disease Liver disease An unusual or allergic reaction to iron, other medications, foods, dyes, or preservatives Pregnant or trying to get pregnant Breastfeeding How should I use this medication? This medication is for infusion into a vein. It is given in a hospital or clinic setting. Talk to your care team about the use of this medication in children. While this medication may be prescribed for children as young as 2 years for selected conditions, precautions do apply. Overdosage: If you think you have taken too much of this medicine contact a poison control center or emergency room at once. NOTE: This medicine is only for you. Do not share this medicine with others. What if I miss a dose? Keep appointments for follow-up doses. It is important not to miss your dose. Call your care team if you are unable to keep an appointment. What may interact with this medication? Do not take this medication with any of the following: Deferoxamine Dimercaprol Other iron products This medication may also interact with the following: Chloramphenicol Deferasirox This list may not describe all possible interactions. Give your health care provider a list of all the medicines, herbs, non-prescription drugs, or dietary supplements you use. Also tell them if you smoke,  drink alcohol, or use illegal drugs. Some items may interact with your medicine. What should I watch for while using this medication? Visit your care team regularly. Tell your care team if your symptoms do not start to get better or if they get worse. You may need blood work done while you are taking this medication. You may need to follow a special diet. Talk to your care team. Foods that contain iron include: whole grains/cereals, dried fruits, beans, or peas, leafy green vegetables, and organ meats (liver, kidney). What side effects may I notice from receiving this medication? Side effects that you should report to your care team as soon as possible: Allergic reactions--skin rash, itching, hives, swelling of the face, lips, tongue, or throat Low blood pressure--dizziness, feeling faint or lightheaded, blurry vision Shortness of breath Side effects that usually do not require medical attention (report to your care team if they continue or are bothersome): Flushing Headache Joint pain Muscle pain Nausea Pain, redness, or irritation at injection site This list may not describe all possible side effects. Call your doctor for medical advice about side effects. You may report side effects to FDA at 1-800-FDA-1088. Where should I keep my medication? This medication is given in a hospital or clinic. It will not be stored at home. NOTE: This sheet is a summary. It may not cover all possible information. If you have questions about this medicine, talk to your doctor, pharmacist, or health care provider.  2024 Elsevier/Gold Standard (2023-05-07 00:00:00)

## 2023-07-17 ENCOUNTER — Encounter: Payer: Self-pay | Admitting: Hematology and Oncology

## 2023-07-23 ENCOUNTER — Inpatient Hospital Stay: Payer: 59

## 2023-07-23 ENCOUNTER — Other Ambulatory Visit: Payer: Self-pay

## 2023-07-23 VITALS — BP 105/69 | HR 105 | Temp 98.2°F | Resp 16

## 2023-07-23 DIAGNOSIS — D509 Iron deficiency anemia, unspecified: Secondary | ICD-10-CM | POA: Diagnosis not present

## 2023-07-23 DIAGNOSIS — Z79899 Other long term (current) drug therapy: Secondary | ICD-10-CM | POA: Diagnosis not present

## 2023-07-23 DIAGNOSIS — D62 Acute posthemorrhagic anemia: Secondary | ICD-10-CM

## 2023-07-23 MED ORDER — SODIUM CHLORIDE 0.9 % IV SOLN
300.0000 mg | Freq: Once | INTRAVENOUS | Status: AC
  Administered 2023-07-23: 300 mg via INTRAVENOUS
  Filled 2023-07-23: qty 300

## 2023-07-23 MED ORDER — SODIUM CHLORIDE 0.9 % IV SOLN: Freq: Once | INTRAVENOUS | Status: AC

## 2023-07-23 NOTE — Patient Instructions (Signed)
 Iron Sucrose Injection What is this medication? IRON SUCROSE (EYE ern SOO krose) treats low levels of iron (iron deficiency anemia) in people with kidney disease. Iron is a mineral that plays an important role in making red blood cells, which carry oxygen from your lungs to the rest of your body. This medicine may be used for other purposes; ask your health care provider or pharmacist if you have questions. COMMON BRAND NAME(S): Venofer What should I tell my care team before I take this medication? They need to know if you have any of these conditions: Anemia not caused by low iron levels Heart disease High levels of iron in the blood Kidney disease Liver disease An unusual or allergic reaction to iron, other medications, foods, dyes, or preservatives Pregnant or trying to get pregnant Breastfeeding How should I use this medication? This medication is for infusion into a vein. It is given in a hospital or clinic setting. Talk to your care team about the use of this medication in children. While this medication may be prescribed for children as young as 2 years for selected conditions, precautions do apply. Overdosage: If you think you have taken too much of this medicine contact a poison control center or emergency room at once. NOTE: This medicine is only for you. Do not share this medicine with others. What if I miss a dose? Keep appointments for follow-up doses. It is important not to miss your dose. Call your care team if you are unable to keep an appointment. What may interact with this medication? Do not take this medication with any of the following: Deferoxamine Dimercaprol Other iron products This medication may also interact with the following: Chloramphenicol Deferasirox This list may not describe all possible interactions. Give your health care provider a list of all the medicines, herbs, non-prescription drugs, or dietary supplements you use. Also tell them if you smoke,  drink alcohol, or use illegal drugs. Some items may interact with your medicine. What should I watch for while using this medication? Visit your care team regularly. Tell your care team if your symptoms do not start to get better or if they get worse. You may need blood work done while you are taking this medication. You may need to follow a special diet. Talk to your care team. Foods that contain iron include: whole grains/cereals, dried fruits, beans, or peas, leafy green vegetables, and organ meats (liver, kidney). What side effects may I notice from receiving this medication? Side effects that you should report to your care team as soon as possible: Allergic reactions--skin rash, itching, hives, swelling of the face, lips, tongue, or throat Low blood pressure--dizziness, feeling faint or lightheaded, blurry vision Shortness of breath Side effects that usually do not require medical attention (report to your care team if they continue or are bothersome): Flushing Headache Joint pain Muscle pain Nausea Pain, redness, or irritation at injection site This list may not describe all possible side effects. Call your doctor for medical advice about side effects. You may report side effects to FDA at 1-800-FDA-1088. Where should I keep my medication? This medication is given in a hospital or clinic. It will not be stored at home. NOTE: This sheet is a summary. It may not cover all possible information. If you have questions about this medicine, talk to your doctor, pharmacist, or health care provider.  2024 Elsevier/Gold Standard (2023-05-07 00:00:00)

## 2023-07-30 ENCOUNTER — Other Ambulatory Visit: Payer: Self-pay

## 2023-07-30 ENCOUNTER — Inpatient Hospital Stay: Payer: 59

## 2023-07-30 VITALS — BP 112/73 | HR 101 | Temp 98.0°F | Resp 16

## 2023-07-30 DIAGNOSIS — Z79899 Other long term (current) drug therapy: Secondary | ICD-10-CM | POA: Diagnosis not present

## 2023-07-30 DIAGNOSIS — D62 Acute posthemorrhagic anemia: Secondary | ICD-10-CM

## 2023-07-30 DIAGNOSIS — D509 Iron deficiency anemia, unspecified: Secondary | ICD-10-CM | POA: Diagnosis not present

## 2023-07-30 MED ORDER — SODIUM CHLORIDE 0.9 % IV SOLN: Freq: Once | INTRAVENOUS | Status: AC

## 2023-07-30 MED ORDER — SODIUM CHLORIDE 0.9 % IV SOLN
300.0000 mg | Freq: Once | INTRAVENOUS | Status: AC
  Administered 2023-07-30: 300 mg via INTRAVENOUS
  Filled 2023-07-30: qty 300

## 2023-07-30 NOTE — Patient Instructions (Signed)
 Iron Sucrose Injection What is this medication? IRON SUCROSE (EYE ern SOO krose) treats low levels of iron (iron deficiency anemia) in people with kidney disease. Iron is a mineral that plays an important role in making red blood cells, which carry oxygen from your lungs to the rest of your body. This medicine may be used for other purposes; ask your health care provider or pharmacist if you have questions. COMMON BRAND NAME(S): Venofer What should I tell my care team before I take this medication? They need to know if you have any of these conditions: Anemia not caused by low iron levels Heart disease High levels of iron in the blood Kidney disease Liver disease An unusual or allergic reaction to iron, other medications, foods, dyes, or preservatives Pregnant or trying to get pregnant Breastfeeding How should I use this medication? This medication is for infusion into a vein. It is given in a hospital or clinic setting. Talk to your care team about the use of this medication in children. While this medication may be prescribed for children as young as 2 years for selected conditions, precautions do apply. Overdosage: If you think you have taken too much of this medicine contact a poison control center or emergency room at once. NOTE: This medicine is only for you. Do not share this medicine with others. What if I miss a dose? Keep appointments for follow-up doses. It is important not to miss your dose. Call your care team if you are unable to keep an appointment. What may interact with this medication? Do not take this medication with any of the following: Deferoxamine Dimercaprol Other iron products This medication may also interact with the following: Chloramphenicol Deferasirox This list may not describe all possible interactions. Give your health care provider a list of all the medicines, herbs, non-prescription drugs, or dietary supplements you use. Also tell them if you smoke,  drink alcohol, or use illegal drugs. Some items may interact with your medicine. What should I watch for while using this medication? Visit your care team regularly. Tell your care team if your symptoms do not start to get better or if they get worse. You may need blood work done while you are taking this medication. You may need to follow a special diet. Talk to your care team. Foods that contain iron include: whole grains/cereals, dried fruits, beans, or peas, leafy green vegetables, and organ meats (liver, kidney). What side effects may I notice from receiving this medication? Side effects that you should report to your care team as soon as possible: Allergic reactions--skin rash, itching, hives, swelling of the face, lips, tongue, or throat Low blood pressure--dizziness, feeling faint or lightheaded, blurry vision Shortness of breath Side effects that usually do not require medical attention (report to your care team if they continue or are bothersome): Flushing Headache Joint pain Muscle pain Nausea Pain, redness, or irritation at injection site This list may not describe all possible side effects. Call your doctor for medical advice about side effects. You may report side effects to FDA at 1-800-FDA-1088. Where should I keep my medication? This medication is given in a hospital or clinic. It will not be stored at home. NOTE: This sheet is a summary. It may not cover all possible information. If you have questions about this medicine, talk to your doctor, pharmacist, or health care provider.  2024 Elsevier/Gold Standard (2023-05-07 00:00:00)

## 2023-07-30 NOTE — Progress Notes (Signed)
Patient declined to stay for full 30 minute post-observation period following Venofer infusion. Patient monitored for 10-minutes following infusion.  Patient's vital signs retaken prior to discharge and remained within normal parameters.  Patient discharged in stable, ambulatory condition.

## 2023-09-06 ENCOUNTER — Telehealth: Payer: Self-pay | Admitting: *Deleted

## 2023-09-06 NOTE — Telephone Encounter (Signed)
This RN attempted to call pt per her request- per mobile number - VM box was full - could not leave a message.  Called home number- informed pt not at home presently. Message left her call was being returned.

## 2023-09-07 ENCOUNTER — Encounter: Payer: Self-pay | Admitting: Hematology and Oncology

## 2023-09-13 ENCOUNTER — Other Ambulatory Visit: Payer: Self-pay

## 2023-09-13 DIAGNOSIS — D500 Iron deficiency anemia secondary to blood loss (chronic): Secondary | ICD-10-CM

## 2023-09-14 ENCOUNTER — Inpatient Hospital Stay: Payer: MEDICAID | Admitting: Hematology and Oncology

## 2023-09-14 ENCOUNTER — Inpatient Hospital Stay: Payer: MEDICAID

## 2023-09-14 ENCOUNTER — Telehealth: Payer: Self-pay

## 2023-09-14 ENCOUNTER — Inpatient Hospital Stay: Payer: MEDICAID | Attending: Hematology and Oncology | Admitting: Hematology and Oncology

## 2023-09-14 DIAGNOSIS — R5383 Other fatigue: Secondary | ICD-10-CM | POA: Insufficient documentation

## 2023-09-14 DIAGNOSIS — F419 Anxiety disorder, unspecified: Secondary | ICD-10-CM | POA: Insufficient documentation

## 2023-09-14 DIAGNOSIS — D509 Iron deficiency anemia, unspecified: Secondary | ICD-10-CM | POA: Insufficient documentation

## 2023-09-14 DIAGNOSIS — Z9049 Acquired absence of other specified parts of digestive tract: Secondary | ICD-10-CM | POA: Insufficient documentation

## 2023-09-14 DIAGNOSIS — Z886 Allergy status to analgesic agent status: Secondary | ICD-10-CM | POA: Insufficient documentation

## 2023-09-14 DIAGNOSIS — B1920 Unspecified viral hepatitis C without hepatic coma: Secondary | ICD-10-CM | POA: Insufficient documentation

## 2023-09-14 DIAGNOSIS — Z820 Family history of epilepsy and other diseases of the nervous system: Secondary | ICD-10-CM | POA: Insufficient documentation

## 2023-09-14 DIAGNOSIS — Z800 Family history of malignant neoplasm of digestive organs: Secondary | ICD-10-CM | POA: Insufficient documentation

## 2023-09-14 DIAGNOSIS — Z9089 Acquired absence of other organs: Secondary | ICD-10-CM | POA: Insufficient documentation

## 2023-09-14 DIAGNOSIS — Z79899 Other long term (current) drug therapy: Secondary | ICD-10-CM | POA: Insufficient documentation

## 2023-09-14 DIAGNOSIS — Z825 Family history of asthma and other chronic lower respiratory diseases: Secondary | ICD-10-CM | POA: Insufficient documentation

## 2023-09-14 DIAGNOSIS — Z832 Family history of diseases of the blood and blood-forming organs and certain disorders involving the immune mechanism: Secondary | ICD-10-CM | POA: Insufficient documentation

## 2023-09-14 DIAGNOSIS — Z818 Family history of other mental and behavioral disorders: Secondary | ICD-10-CM | POA: Insufficient documentation

## 2023-09-14 NOTE — Telephone Encounter (Signed)
Spoke with patient per Dr Al Pimple and asked if she would be coming to appointments today.  She stated she either didn't know about or she forgot.  Wanted to know if this was related to her iron.  Gave her the phone number [phone] to call and reschedule her appointment.  Patient confirmed and understood.

## 2023-09-14 NOTE — Progress Notes (Deleted)
[facility] CONSULT NOTE  Patient Care Team: Inc, [facility] as PCP - General (Pediatrics)  CHIEF COMPLAINTS/PURPOSE OF CONSULTATION:  Iron deficiency anemia  ASSESSMENT & PLAN:   This is a very pleasant 32 year old premenopausal female patient with past medical history significant for anxiety, hepatitis C referred to hematology for evaluation and recommendations regarding severe iron deficiency anemia.   Iron deficiency anemia affects a large proportion of the wellness population especially females of childbearing age and children.  Common causes of iron deficiency include blood loss, reduced iron absorption because of previous surgeries, dietary restrictions or other malabsorption issues, medications that reduce gastric acidity are due to inherited disorders such as IRIDA due to TMPRSS6 mutation. Symptoms of iron deficiency usually include fatigue, pica, restless legs, exercise intolerance, exertional dyspnea, headaches and weakness. Diagnosis can be usually made by history, physical examination, CBC and iron studies.  We generally treat iron deficiency anemia with oral supplements if this can be tolerated.  IV iron is appropriate for patients are unable to tolerate due to gastrointestinal side effects or for patients with severe/ongoing blood loss, history of gastric bypass which reduces gastric acid and henceforth will impair intestinal absorption of oral iron, malabsorption syndromes are occasional in pregnancy. There are several formularies of intravenous iron available in the market.  We have discussed about risk of allergic/infusion reactions including potentially life-threatening anaphylaxis with intravenous iron however these serious allergic reactions are exceedingly rare and overestimated.   In contrast to serious allergic reactions, IV iron may be associated with nonallergic infusion reactions including self-limited urticaria, palpitations,  dizziness, neck and back spasm which again occur in less than 1% of the individuals and do not progress to more serious reactions.   Patient is unable to tolerate oral iron supplementation because of nausea and she says they do not work because she has been taking it for about a year.  Her most recent labs showed a ferritin of 15, hemoglobin of 8.6 consistent with moderate to severe anemia.  Given her intolerance to oral iron, we have discussed about IV iron today as mentioned above.  She is willing to proceed. She can RTC in 3 months with repeat labs.   HISTORY OF PRESENTING ILLNESS:  Brenda Spencer 32 y.o. female is here because of IDA  This is a 33 year old female patient with past medical history significant for anxiety, hepatitis C referred to hematology for evaluation and recommendations regarding iron deficiency anemia.  Patient arrived to the appointment today by herself.  She tells me that she is feeling fatigued, does not want to get out of bed, has been feeling dizzy and has been trying to take oral iron for the past year without much success.  She has been taking it for a year however is feeling quite nauseated and most recent labs did not show significant improvement hence she was recommended to see [place] for consideration of IV iron.  She does report heavy menstrual cycles.  She changes her pad/tampon every 2 hours and this flow continues for about 5 days.  No other hematochezia or melena.  She had 4 children, youngest is 6 years old.  No history of breast-feeding.  Rest of the pertinent 10 point ROS reviewed and negative  MEDICAL HISTORY:  Past Medical History:  Diagnosis Date   Acute post-traumatic headache, not intractable 01/19/2016   Asthma    ALbuterol inhaler as needed   CHF (congestive heart failure) (HCC) 2011   RESOLVED; HAD CARDIAC  EVAL    Chronic back pain    reason unknown   Congestive heart failure (HCC) 2011   related to preeclampsia   Nocturia    Noncompliance     Postgastrectomy malabsorption     SURGICAL HISTORY: Past Surgical History:  Procedure Laterality Date   BREAST ENHANCEMENT SURGERY     CHOLECYSTECTOMY     teenager   OPEN REDUCTION INTERNAL FIXATION (ORIF) DISTAL RADIAL FRACTURE Left 07/01/2020   Procedure: OPEN REDUCTION INTERNAL FIXATION (ORIF) DISTAL RADIAL FRACTURE;  Surgeon: Sheral Apley, MD;  Location: [facility] OR;  Service: Orthopedics;  Laterality: Left;   SACRO-ILIAC PINNING Left 07/01/2020   Procedure: Loyal Gambler;  Surgeon: Myrene Galas, MD;  Location: [facility] OR;  Service: Orthopedics;  Laterality: Left;   SLEEVE GASTROPLASTY  01/18/2017   SUBOCCIPITAL CRANIECTOMY CERVICAL LAMINECTOMY N/A 06/09/2016   Procedure: Chiari Decompression;  Surgeon: Lisbeth Renshaw, MD;  Location: [facility]UR[facility]S;  Service: Neurosurgery;  Laterality: N/A;  posterior/occipital   TEAR DUCT PROBING  as a child   TONSILLECTOMY     as a teenager   TUBAL LIGATION Bilateral 09/10/2015   Procedure: POST PARTUM TUBAL LIGATION;  Surgeon: Catalina Antigua, MD;  Location: [facility] ORS;  Service: Gynecology;  Laterality: Bilateral;   WISDOM TOOTH EXTRACTION     as a teenager    SOCIAL HISTORY: Social History   Socioeconomic History   Marital status: Single    Spouse name: CHRISTOPHER   Number of children: 1   Years of education: 12   Highest education level: Not on file  Occupational History   Occupation: HOMEMAKER  Tobacco Use   Smoking status: Never   Smokeless tobacco: Never  Vaping Use   Vaping status: Never Used  Substance and Sexual Activity   Alcohol use: No    Alcohol/week: 0.0 standard drinks of alcohol   Drug use: Not Currently   Sexual activity: Yes    Partners: Male    Birth control/protection: Surgical  Other Topics Concern   Not on file  Social History Narrative   3-4 cups of tea and soda a day    Social Determinants of Health   Financial Resource Strain: Not on File (06/05/2022)   Received from [employer], [facility]    Financial Resource Strain: 0  Food Insecurity: Low Risk  (06/29/2023)   Received from [facility]   Hunger Vital Sign    Worried About Running Out of Food in the Last Year: Never true    Ran Out of Food in the Last Year: Never true  Transportation Needs: Not on file (06/29/2023)  Physical Activity: Not on File (06/05/2022)   Received from [place], [place]   Physical Activity    Physical Activity: 0  Stress: Not on File (06/05/2022)   Received from [facility], [place]   Stress    Stress: 0  Social Connections: Not on File (08/19/2023)   Received from [facility]   Social Connections    Connectedness: 0  Intimate Partner Violence: Unknown (03/17/2022)   Received from [employer], [facility]   HITS    Physically Hurt: Not on file    Insult or Talk Down To: Not on file    Threaten Physical Harm: Not on file    Scream or Curse: Not on file    FAMILY HISTORY: Family History  Problem Relation Age of Onset   Learning disabilities Other    Colon cancer Maternal Grandmother    Cancer Maternal Grandmother  colon   Asthma Brother    Learning disabilities Brother    Asthma Daughter    Learning disabilities Daughter    Seizures Daughter    Chromosomal disorder Daughter        1q21.1 microdeletion   Cataracts Daughter        congenital   Anesthesia problems Neg Hx    Other Neg Hx     ALLERGIES:  is allergic to metoclopramide, ondansetron, and aspirin.  MEDICATIONS:  Current Outpatient Medications  Medication Sig Dispense Refill   acetaminophen (TYLENOL) 500 MG tablet Take 2 tablets (1,000 mg total) by mouth every 6 (six) hours. 30 tablet 0   ARIPiprazole (ABILIFY) 5 MG tablet Take 5 mg by mouth daily.     celecoxib (CELEBREX) 200 MG capsule Take 1 capsule (200 mg total) by mouth 2 (two) times daily. 20 capsule 0   cephALEXin (KEFLEX) 500 MG capsule Take 1 capsule (500 mg total) by mouth 4 (four) times daily. 20 capsule 0   cyclobenzaprine (FLEXERIL) 10 MG tablet Take  by mouth.     ibuprofen (ADVIL) 200 MG tablet Take 200-800 mg by mouth every 6 (six) hours as needed for fever or moderate pain.     methylPREDNISolone (MEDROL DOSEPAK) 4 MG TBPK tablet Follow package insert 21 each 0   oxyCODONE (ROXICODONE) 5 MG immediate release tablet Take 1 tablet (5 mg total) by mouth every 4 (four) hours as needed for up to 15 doses for breakthrough pain. 15 tablet 0   pregabalin (LYRICA) 100 MG capsule Take 100 mg by mouth every 8 (eight) hours.     QUEtiapine (SEROQUEL) 25 MG tablet See admin instructions. Take 1/2 tablet at bedtime for sleep for 4 nights,then can increase to 1 tablet at bedtime     tiaGABine (GABITRIL) 4 MG tablet See admin instructions. Take 1/2 tablet twice daily for 7 days,if needed & tolerated increase to 1 tablet twice daily     No current facility-administered medications for this visit.     PHYSICAL EXAMINATION: ECOG PERFORMANCE STATUS: 0 - Asymptomatic  There were no vitals filed for this visit.  There were no vitals filed for this visit.   GENERAL:alert, no distress and comfortable SKIN: skin pallor noted. LYMPH:  no palpable lymphadenopathy in the cervical, axillary LUNGS: clear to auscultation and percussion with normal breathing effort HEART: regular rate & rhythm and no murmurs and no lower extremity edema ABDOMEN:abdomen soft, non-tender and normal bowel sounds Musculoskeletal:no cyanosis of digits and no clubbing  PSYCH: alert & oriented x 3 with fluent speech NEURO: no focal motor/sensory deficits  LABORATORY DATA:  I have reviewed the data as listed Lab Results  Component Value Date   WBC 10.7 (H) 05/27/2023   HGB 10.5 (L) 05/27/2023   HCT 35.9 (L) 05/27/2023   MCV 70.4 (L) 05/27/2023   PLT 463 (H) 05/27/2023     Chemistry      Component Value Date/Time   NA 140 05/27/2023 1033   NA 141 05/06/2018 1145   K 4.0 05/27/2023 1033   CL 106 05/27/2023 1033   CO2 26 05/27/2023 1033   BUN 12 05/27/2023 1033   BUN 8  05/06/2018 1145   CREATININE 0.71 05/27/2023 1033   CREATININE 0.36 (L) 04/01/2015 1356      Component Value Date/Time   CALCIUM 9.2 05/27/2023 1033   ALKPHOS 69 06/29/2020 0840   AST 36 06/29/2020 0840   ALT 24 06/29/2020 0840   BILITOT 1.5 (H) 06/29/2020 0840  BILITOT 0.3 11/26/2017 1323       RADIOGRAPHIC STUDIES: I have personally reviewed the radiological images as listed and agreed with the findings in the report. No results found.  All questions were answered. The patient knows to call the clinic with any problems, questions or concerns. I spent 45 minutes in the care of this patient including H and P, review of records, counseling and coordination of care.     Rachel Moulds, MD 09/14/2023 9:27 AM

## 2023-09-22 ENCOUNTER — Inpatient Hospital Stay: Payer: MEDICAID

## 2023-09-22 DIAGNOSIS — Z800 Family history of malignant neoplasm of digestive organs: Secondary | ICD-10-CM | POA: Diagnosis not present

## 2023-09-22 DIAGNOSIS — Z825 Family history of asthma and other chronic lower respiratory diseases: Secondary | ICD-10-CM | POA: Diagnosis not present

## 2023-09-22 DIAGNOSIS — Z9049 Acquired absence of other specified parts of digestive tract: Secondary | ICD-10-CM | POA: Diagnosis not present

## 2023-09-22 DIAGNOSIS — Z886 Allergy status to analgesic agent status: Secondary | ICD-10-CM | POA: Diagnosis not present

## 2023-09-22 DIAGNOSIS — Z818 Family history of other mental and behavioral disorders: Secondary | ICD-10-CM | POA: Diagnosis not present

## 2023-09-22 DIAGNOSIS — B1920 Unspecified viral hepatitis C without hepatic coma: Secondary | ICD-10-CM | POA: Diagnosis not present

## 2023-09-22 DIAGNOSIS — F419 Anxiety disorder, unspecified: Secondary | ICD-10-CM | POA: Diagnosis not present

## 2023-09-22 DIAGNOSIS — D500 Iron deficiency anemia secondary to blood loss (chronic): Secondary | ICD-10-CM

## 2023-09-22 DIAGNOSIS — D509 Iron deficiency anemia, unspecified: Secondary | ICD-10-CM | POA: Diagnosis present

## 2023-09-22 DIAGNOSIS — Z820 Family history of epilepsy and other diseases of the nervous system: Secondary | ICD-10-CM | POA: Diagnosis not present

## 2023-09-22 DIAGNOSIS — Z9089 Acquired absence of other organs: Secondary | ICD-10-CM | POA: Diagnosis not present

## 2023-09-22 DIAGNOSIS — Z832 Family history of diseases of the blood and blood-forming organs and certain disorders involving the immune mechanism: Secondary | ICD-10-CM | POA: Diagnosis not present

## 2023-09-22 DIAGNOSIS — R5383 Other fatigue: Secondary | ICD-10-CM | POA: Diagnosis not present

## 2023-09-22 DIAGNOSIS — Z79899 Other long term (current) drug therapy: Secondary | ICD-10-CM | POA: Diagnosis not present

## 2023-09-22 LAB — CBC WITH DIFFERENTIAL (CANCER CENTER ONLY)
Abs Immature Granulocytes: 0.05 10*3/uL (ref 0.00–0.07)
Basophils Absolute: 0.1 10*3/uL (ref 0.0–0.1)
Basophils Relative: 1 %
Eosinophils Absolute: 0.7 10*3/uL — ABNORMAL HIGH (ref 0.0–0.5)
Eosinophils Relative: 8 %
HCT: 43.5 % (ref 36.0–46.0)
Hemoglobin: 13.6 g/dL (ref 12.0–15.0)
Immature Granulocytes: 1 %
Lymphocytes Relative: 19 %
Lymphs Abs: 1.7 10*3/uL (ref 0.7–4.0)
MCH: 27.4 pg (ref 26.0–34.0)
MCHC: 31.3 g/dL (ref 30.0–36.0)
MCV: 87.5 fL (ref 80.0–100.0)
Monocytes Absolute: 0.5 10*3/uL (ref 0.1–1.0)
Monocytes Relative: 5 %
Neutro Abs: 6.1 10*3/uL (ref 1.7–7.7)
Neutrophils Relative %: 66 %
Platelet Count: 329 10*3/uL (ref 150–400)
RBC: 4.97 MIL/uL (ref 3.87–5.11)
RDW: 19.2 % — ABNORMAL HIGH (ref 11.5–15.5)
WBC Count: 9.2 10*3/uL (ref 4.0–10.5)
nRBC: 0 % (ref 0.0–0.2)

## 2023-09-22 LAB — CMP (CANCER CENTER ONLY)
ALT: 15 U/L (ref 0–44)
AST: 16 U/L (ref 15–41)
Albumin: 4.2 g/dL (ref 3.5–5.0)
Alkaline Phosphatase: 77 U/L (ref 38–126)
Anion gap: 7 (ref 5–15)
BUN: <5 mg/dL — ABNORMAL LOW (ref 6–20)
CO2: 28 mmol/L (ref 22–32)
Calcium: 9.1 mg/dL (ref 8.9–10.3)
Chloride: 104 mmol/L (ref 98–111)
Creatinine: 0.61 mg/dL (ref 0.44–1.00)
GFR, Estimated: >60 mL/min (ref >60)
Glucose, Bld: 133 mg/dL — ABNORMAL HIGH (ref 70–99)
Potassium: 3.7 mmol/L (ref 3.5–5.1)
Sodium: 139 mmol/L (ref 135–145)
Total Bilirubin: 0.5 mg/dL (ref 0.3–1.2)
Total Protein: 7 g/dL (ref 6.5–8.1)

## 2023-09-22 LAB — FERRITIN: Ferritin: 37 ng/mL (ref 11–307)

## 2023-09-22 LAB — IRON AND IRON BINDING CAPACITY (CC-WL,HP ONLY)
Iron: 96 ug/dL (ref 28–170)
Saturation Ratios: 26 % (ref 10.4–31.8)
TIBC: 365 ug/dL (ref 250–450)
UIBC: 269 ug/dL (ref 148–442)

## 2023-09-24 ENCOUNTER — Inpatient Hospital Stay: Payer: MEDICAID | Admitting: Hematology and Oncology

## 2023-09-24 VITALS — BP 121/84 | HR 102 | Temp 97.7°F | Resp 16 | Ht 60.0 in | Wt 133.1 lb

## 2023-09-24 DIAGNOSIS — D509 Iron deficiency anemia, unspecified: Secondary | ICD-10-CM | POA: Diagnosis not present

## 2023-09-24 DIAGNOSIS — D500 Iron deficiency anemia secondary to blood loss (chronic): Secondary | ICD-10-CM

## 2023-09-24 DIAGNOSIS — R233 Spontaneous ecchymoses: Secondary | ICD-10-CM

## 2023-09-24 NOTE — Progress Notes (Signed)
[facility] CONSULT NOTE  Patient Care Team: Inc, [facility] as PCP - General (Pediatrics)  CHIEF COMPLAINTS/PURPOSE OF CONSULTATION:  Iron deficiency anemia  ASSESSMENT & PLAN:   This is a very pleasant 32 year old premenopausal female patient with past medical history significant for anxiety, hepatitis C referred to hematology for evaluation and recommendations regarding severe iron deficiency anemia.   IDA, resolved.  CBC shows hemoglobin to be 13.6 g/dL, ferritin at 37.  At this time there is no indication for further iron supplementation.  She can return to clinic in 6 months with repeat labs to follow-up on this.  Fatigue Persistent despite correction of prior iron deficiency. Hemoglobin improved from 10.5 to 13.6 g/dL. Other causes of fatigue need to be explored. -Recommend follow-up with primary care provider for further evaluation, including possible sleep studies and assessment for depression.  Easy Bruising Noted on extremities. Platelet count normal. Bruises heal within expected timeframe. -No specific intervention needed at this time. -Okay to continue monitoring this.  Post-Gastric Surgery Possible malabsorption. -Continue multivitamin supplementation.  Follow-up in 6 months with repeat labs.  HISTORY OF PRESENTING ILLNESS:  Brenda Spencer 32 y.o. female is here because of IDA  This is a 32 year old female patient with past medical history significant for anxiety, hepatitis C referred to hematology for evaluation and recommendations regarding iron deficiency anemia.    The patient presents with ongoing fatigue and easy bruising. Despite a history of iron deficiency, recent labs show normal iron levels and a hemoglobin of 13.6 g/dL, the highest it has ever been. The patient also reports easy bruising, particularly on the extremities, but denies any unusual bruising on the abdomen or other areas. She sleeps well, often for  extended periods, but still feels a lack of energy. She also mentions a potential for depression due to ongoing personal issues. The patient has a history of stomach surgery and takes a multivitamin due to potential malabsorption issues.  MEDICAL HISTORY:  Past Medical History:  Diagnosis Date   Acute post-traumatic headache, not intractable 01/19/2016   Asthma    ALbuterol inhaler as needed   CHF (congestive heart failure) (HCC) 2011   RESOLVED; HAD CARDIAC EVAL    Chronic back pain    reason unknown   Congestive heart failure (HCC) 2011   related to preeclampsia   Nocturia    Noncompliance    Postgastrectomy malabsorption     SURGICAL HISTORY: Past Surgical History:  Procedure Laterality Date   BREAST ENHANCEMENT SURGERY     CHOLECYSTECTOMY     teenager   OPEN REDUCTION INTERNAL FIXATION (ORIF) DISTAL RADIAL FRACTURE Left 07/01/2020   Procedure: OPEN REDUCTION INTERNAL FIXATION (ORIF) DISTAL RADIAL FRACTURE;  Surgeon: Sheral Apley, MD;  Location: [facility] OR;  Service: Orthopedics;  Laterality: Left;   SACRO-ILIAC PINNING Left 07/01/2020   Procedure: Loyal Gambler;  Surgeon: Myrene Galas, MD;  [facility] OR;  Service: Orthopedics;  Laterality: Left;   SLEEVE GASTROPLASTY  01/18/2017   SUBOCCIPITAL CRANIECTOMY CERVICAL LAMINECTOMY N/A 06/09/2016   Procedure: Chiari Decompression;  Surgeon: Lisbeth Renshaw, MD;  Location: [facility] OR[facility];  Service: Neurosurgery;  Laterality: N/A;  posterior/occipital   TEAR DUCT PROBING  as a child   TONSILLECTOMY     as a teenager   TUBAL LIGATION Bilateral 09/10/2015   Procedure: POST PARTUM TUBAL LIGATION;  Surgeon: Catalina Antigua, MD;  Location: [facility] ORS;  Service: Gynecology;  Laterality: Bilateral;   WISDOM TOOTH EXTRACTION  as a teenager    SOCIAL HISTORY: Social History   Socioeconomic History   Marital status: Single    Spouse name: CHRISTOPHER   Number of children: 1   Years of education: 12   Highest education level: Not  on file  Occupational History   Occupation: HOMEMAKER  Tobacco Use   Smoking status: Never   Smokeless tobacco: Never  Vaping Use   Vaping status: Never Used  Substance and Sexual Activity   Alcohol use: No    Alcohol/week: 0.0 standard drinks of alcohol   Drug use: Not Currently   Sexual activity: Yes    Partners: Male    Birth control/protection: Surgical  Other Topics Concern   Not on file  Social History Narrative   3-4 cups of tea and soda a day    Social Determinants of Health   Financial Resource Strain: Not on File (06/05/2022)   Received from [employer], [employer]    Financial Resource Strain: 0  Food Insecurity: Low Risk  (06/29/2023)   Received from [facility]   Hunger Vital Sign    Worried About Running Out of Food in the Last Year: Never true    Ran Out of Food in the Last Year: Never true  Transportation Needs: Not on file (06/29/2023)  Physical Activity: Not on File (06/05/2022)   Received from [place], [place]   Physical Activity    Physical Activity: 0  Stress: Not on File (06/05/2022)   Received from [facility], [place]   Stress    Stress: 0  Social Connections: Not on File (08/19/2023)   Received from [facility]   Social Connections    Connectedness: 0  Intimate Partner Violence: Unknown (03/17/2022)   Received from [employer], [facility]   HITS    Physically Hurt: Not on file    Insult or Talk Down To: Not on file    Threaten Physical Harm: Not on file    Scream or Curse: Not on file    FAMILY HISTORY: Family History  Problem Relation Age of Onset   Learning disabilities Other    Colon cancer Maternal Grandmother    Cancer Maternal Grandmother        colon   Asthma Brother    Learning disabilities Brother    Asthma Daughter    Learning disabilities Daughter    Seizures Daughter    Chromosomal disorder Daughter        1q21.1 microdeletion   Cataracts Daughter        congenital   Anesthesia problems Neg Hx    Other Neg Hx      ALLERGIES:  is allergic to metoclopramide, ondansetron, and aspirin.  MEDICATIONS:  Current Outpatient Medications  Medication Sig Dispense Refill   acetaminophen (TYLENOL) 500 MG tablet Take 2 tablets (1,000 mg total) by mouth every 6 (six) hours. 30 tablet 0   ARIPiprazole (ABILIFY) 5 MG tablet Take 5 mg by mouth daily.     celecoxib (CELEBREX) 200 MG capsule Take 1 capsule (200 mg total) by mouth 2 (two) times daily. 20 capsule 0   cephALEXin (KEFLEX) 500 MG capsule Take 1 capsule (500 mg total) by mouth 4 (four) times daily. 20 capsule 0   cyclobenzaprine (FLEXERIL) 10 MG tablet Take by mouth.     ibuprofen (ADVIL) 200 MG tablet Take 200-800 mg by mouth every 6 (six) hours as needed for fever or moderate pain.     methylPREDNISolone (MEDROL DOSEPAK) 4 MG TBPK tablet  Follow package insert 21 each 0   oxyCODONE (ROXICODONE) 5 MG immediate release tablet Take 1 tablet (5 mg total) by mouth every 4 (four) hours as needed for up to 15 doses for breakthrough pain. 15 tablet 0   pregabalin (LYRICA) 100 MG capsule Take 100 mg by mouth every 8 (eight) hours.     QUEtiapine (SEROQUEL) 25 MG tablet See admin instructions. Take 1/2 tablet at bedtime for sleep for 4 nights,then can increase to 1 tablet at bedtime     tiaGABine (GABITRIL) 4 MG tablet See admin instructions. Take 1/2 tablet twice daily for 7 days,if needed & tolerated increase to 1 tablet twice daily     No current facility-administered medications for this visit.     PHYSICAL EXAMINATION: ECOG PERFORMANCE STATUS: 0 - Asymptomatic  Vitals:   09/24/23 0913  BP: 121/84  Pulse: (!) 102  Resp: 16  Temp: 97.7 F (36.5 C)  SpO2: 100%    Filed Weights   09/24/23 0913  Weight: 133 lb 1.6 oz (60.4 kg)     GENERAL:alert, no distress and comfortable SKIN: skin pallor noted. LYMPH:  no palpable lymphadenopathy in the cervical, axillary LUNGS: clear to auscultation and percussion with normal breathing effort HEART: regular  rate & rhythm and no murmurs and no lower extremity edema ABDOMEN:abdomen soft, non-tender and normal bowel sounds Musculoskeletal:no cyanosis of digits and no clubbing  PSYCH: alert & oriented x 3 with fluent speech NEURO: no focal motor/sensory deficits  LABORATORY DATA:  I have reviewed the data as listed Lab Results  Component Value Date   WBC 9.2 09/22/2023   HGB 13.6 09/22/2023   HCT 43.5 09/22/2023   MCV 87.5 09/22/2023   PLT 329 09/22/2023     Chemistry      Component Value Date/Time   NA 139 09/22/2023 0848   NA 141 05/06/2018 1145   K 3.7 09/22/2023 0848   CL 104 09/22/2023 0848   CO2 28 09/22/2023 0848   BUN <5 (L) 09/22/2023 0848   BUN 8 05/06/2018 1145   CREATININE 0.61 09/22/2023 0848   CREATININE 0.36 (L) 04/01/2015 1356      Component Value Date/Time   CALCIUM 9.1 09/22/2023 0848   ALKPHOS 77 09/22/2023 0848   AST 16 09/22/2023 0848   ALT 15 09/22/2023 0848   BILITOT 0.5 09/22/2023 0848       RADIOGRAPHIC STUDIES: I have personally reviewed the radiological images as listed and agreed with the findings in the report. No results found.  All questions were answered. The patient knows to call the clinic with any problems, questions or concerns. I spent 30 minutes in the care of this patient including H and P, review of records, counseling and coordination of care.     Rachel Moulds, MD 09/24/2023 9:22 AM

## 2023-09-29 ENCOUNTER — Encounter: Payer: Self-pay | Admitting: Hematology and Oncology

## 2023-09-29 NOTE — Progress Notes (Signed)
No show

## 2024-02-10 ENCOUNTER — Emergency Department (HOSPITAL_COMMUNITY)
Admission: EM | Admit: 2024-02-10 | Discharge: 2024-02-10 | Disposition: A | Discharge status: Home / Self Care | Payer: MEDICAID | Attending: Emergency Medicine | Admitting: Emergency Medicine

## 2024-02-10 ENCOUNTER — Encounter: Payer: Self-pay | Admitting: Hematology and Oncology

## 2024-02-10 ENCOUNTER — Emergency Department (HOSPITAL_COMMUNITY): Payer: MEDICAID

## 2024-02-10 DIAGNOSIS — R519 Headache, unspecified: Secondary | ICD-10-CM | POA: Diagnosis not present

## 2024-02-10 DIAGNOSIS — S39012A Strain of muscle, fascia and tendon of lower back, initial encounter: Secondary | ICD-10-CM | POA: Insufficient documentation

## 2024-02-10 DIAGNOSIS — T148XXA Other injury of unspecified body region, initial encounter: Secondary | ICD-10-CM

## 2024-02-10 DIAGNOSIS — S29011A Strain of muscle and tendon of front wall of thorax, initial encounter: Secondary | ICD-10-CM | POA: Insufficient documentation

## 2024-02-10 DIAGNOSIS — S39011A Strain of muscle, fascia and tendon of abdomen, initial encounter: Secondary | ICD-10-CM | POA: Diagnosis not present

## 2024-02-10 DIAGNOSIS — S56912A Strain of unspecified muscles, fascia and tendons at forearm level, left arm, initial encounter: Secondary | ICD-10-CM | POA: Diagnosis not present

## 2024-02-10 DIAGNOSIS — S59912A Unspecified injury of left forearm, initial encounter: Secondary | ICD-10-CM | POA: Diagnosis present

## 2024-02-10 DIAGNOSIS — Y92410 Unspecified street and highway as the place of occurrence of the external cause: Secondary | ICD-10-CM | POA: Diagnosis not present

## 2024-02-10 LAB — CBC WITH DIFFERENTIAL/PLATELET
Abs Immature Granulocytes: 0.02 10*3/uL (ref 0.00–0.07)
Basophils Absolute: 0.1 10*3/uL (ref 0.0–0.1)
Basophils Relative: 1 %
Eosinophils Absolute: 0.3 10*3/uL (ref 0.0–0.5)
Eosinophils Relative: 4 %
HCT: 41.4 % (ref 36.0–46.0)
Hemoglobin: 13.4 g/dL (ref 12.0–15.0)
Immature Granulocytes: 0 %
Lymphocytes Relative: 28 %
Lymphs Abs: 2 10*3/uL (ref 0.7–4.0)
MCH: 29.9 pg (ref 26.0–34.0)
MCHC: 32.4 g/dL (ref 30.0–36.0)
MCV: 92.4 fL (ref 80.0–100.0)
Monocytes Absolute: 0.7 10*3/uL (ref 0.1–1.0)
Monocytes Relative: 10 %
Neutro Abs: 4 10*3/uL (ref 1.7–7.7)
Neutrophils Relative %: 57 %
Platelets: 310 10*3/uL (ref 150–400)
RBC: 4.48 MIL/uL (ref 3.87–5.11)
RDW: 13.4 % (ref 11.5–15.5)
WBC: 7.1 10*3/uL (ref 4.0–10.5)
nRBC: 0 % (ref 0.0–0.2)

## 2024-02-10 LAB — COMPREHENSIVE METABOLIC PANEL
ALT: 16 U/L (ref 0–44)
AST: 22 U/L (ref 15–41)
Albumin: 4 g/dL (ref 3.5–5.0)
Alkaline Phosphatase: 63 U/L (ref 38–126)
Anion gap: 7 (ref 5–15)
BUN: 8 mg/dL (ref 6–20)
CO2: 27 mmol/L (ref 22–32)
Calcium: 8.4 mg/dL — ABNORMAL LOW (ref 8.9–10.3)
Chloride: 105 mmol/L (ref 98–111)
Creatinine, Ser: 0.56 mg/dL (ref 0.44–1.00)
GFR, Estimated: >60 mL/min (ref >60)
Glucose, Bld: 66 mg/dL — ABNORMAL LOW (ref 70–99)
Potassium: 3.5 mmol/L (ref 3.5–5.1)
Sodium: 139 mmol/L (ref 135–145)
Total Bilirubin: 0.9 mg/dL (ref 0.0–1.2)
Total Protein: 6.9 g/dL (ref 6.5–8.1)

## 2024-02-10 LAB — HCG, SERUM, QUALITATIVE: Preg, Serum: NEGATIVE

## 2024-02-10 MED ORDER — OXYCODONE-ACETAMINOPHEN 5-325 MG PO TABS: 1.0000 | ORAL_TABLET | Freq: Four times a day (QID) | ORAL | 0 refills | Status: AC | PRN

## 2024-02-10 MED ORDER — CYCLOBENZAPRINE HCL 10 MG PO TABS: 10.0000 mg | ORAL_TABLET | Freq: Two times a day (BID) | ORAL | 0 refills | Status: AC | PRN

## 2024-02-10 MED ORDER — IOHEXOL 300 MG/ML  SOLN
100.0000 mL | Freq: Once | INTRAMUSCULAR | Status: AC | PRN
  Administered 2024-02-10: 100 mL via INTRAVENOUS

## 2024-02-10 MED ORDER — OXYCODONE-ACETAMINOPHEN 5-325 MG PO TABS
1.0000 | ORAL_TABLET | Freq: Once | ORAL | Status: AC
  Administered 2024-02-10: 1 via ORAL
  Filled 2024-02-10: qty 1

## 2024-02-10 MED ORDER — FENTANYL CITRATE PF 50 MCG/ML IJ SOSY
50.0000 ug | PREFILLED_SYRINGE | Freq: Once | INTRAMUSCULAR | Status: AC
  Administered 2024-02-10: 50 ug via INTRAVENOUS
  Filled 2024-02-10: qty 1
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
## 2024-02-22 ENCOUNTER — Other Ambulatory Visit: Payer: Self-pay | Admitting: Nurse Practitioner

## 2024-02-22 DIAGNOSIS — D376 Neoplasm of uncertain behavior of liver, gallbladder and bile ducts: Secondary | ICD-10-CM

## 2024-02-22 DIAGNOSIS — R932 Abnormal findings on diagnostic imaging of liver and biliary tract: Secondary | ICD-10-CM

## 2024-03-01 ENCOUNTER — Ambulatory Visit
Admission: RE | Admit: 2024-03-01 | Discharge: 2024-03-01 | Disposition: A | Discharge status: Home / Self Care | Payer: MEDICAID | Source: Ambulatory Visit | Attending: Nurse Practitioner | Admitting: Nurse Practitioner

## 2024-03-01 DIAGNOSIS — R932 Abnormal findings on diagnostic imaging of liver and biliary tract: Secondary | ICD-10-CM

## 2024-03-01 DIAGNOSIS — D376 Neoplasm of uncertain behavior of liver, gallbladder and bile ducts: Secondary | ICD-10-CM

## 2024-03-01 MED ORDER — GADOPICLENOL 0.5 MMOL/ML IV SOLN
7.0000 mL | Freq: Once | INTRAVENOUS | Status: AC | PRN
Start: 2024-03-01 — End: 2024-03-01
  Administered 2024-03-01: 7 mL via INTRAVENOUS

## 2024-03-30 ENCOUNTER — Other Ambulatory Visit: Payer: Self-pay

## 2024-03-30 ENCOUNTER — Telehealth: Payer: Self-pay

## 2024-03-30 DIAGNOSIS — D500 Iron deficiency anemia secondary to blood loss (chronic): Secondary | ICD-10-CM

## 2024-03-30 NOTE — Telephone Encounter (Signed)
Mailbox was full and could not leave a message. 

## 2024-03-31 ENCOUNTER — Inpatient Hospital Stay: Payer: MEDICAID | Attending: Hematology and Oncology

## 2024-03-31 ENCOUNTER — Inpatient Hospital Stay: Payer: MEDICAID | Admitting: Hematology and Oncology

## 2024-03-31 NOTE — Progress Notes (Deleted)
 [facility] CONSULT NOTE  Patient Care Team: [facility] as PCP - General (Pediatrics)  CHIEF COMPLAINTS/PURPOSE OF CONSULTATION:  Iron  deficiency anemia  ASSESSMENT & PLAN:   This is a very pleasant 33 year old premenopausal female patient with past medical history significant for anxiety, hepatitis C referred to hematology for evaluation and recommendations regarding severe iron  deficiency anemia.   IDA, resolved.  CBC shows hemoglobin to be 13.6 g/dL, ferritin at 37.  At this time there is no indication for further iron  supplementation.  She can return to clinic in 6 months with repeat labs to follow-up on this.  Fatigue Persistent despite correction of prior iron  deficiency. Hemoglobin improved from 10.5 to 13.6 g/dL. Other causes of fatigue need to be explored. -Recommend follow-up with primary care provider for further evaluation, including possible sleep studies and assessment for depression.  Easy Bruising Noted on extremities. Platelet count normal. Bruises heal within expected timeframe. -No specific intervention needed at this time. -Okay to continue monitoring this.  Post-Gastric Surgery Possible malabsorption. -Continue multivitamin supplementation.  Follow-up in 6 months with repeat labs.  HISTORY OF PRESENTING ILLNESS:  Brenda Spencer 33 y.o. female is here because of IDA  This is a 33 year old female patient with past medical history significant for anxiety, hepatitis C referred to hematology for evaluation and recommendations regarding iron  deficiency anemia.      MEDICAL HISTORY:  Past Medical History:  Diagnosis Date   Acute post-traumatic headache, not intractable 01/19/2016   Asthma    ALbuterol  inhaler as needed   CHF (congestive heart failure) (HCC) 2011   RESOLVED; HAD CARDIAC EVAL    Chronic back pain    reason unknown   Congestive heart failure (HCC) 2011   related to preeclampsia   Nocturia     Noncompliance    Postgastrectomy malabsorption     SURGICAL HISTORY: Past Surgical History:  Procedure Laterality Date   BREAST ENHANCEMENT SURGERY     CHOLECYSTECTOMY     teenager   OPEN REDUCTION INTERNAL FIXATION (ORIF) DISTAL RADIAL FRACTURE Left 07/01/2020   Procedure: OPEN REDUCTION INTERNAL FIXATION (ORIF) DISTAL RADIAL FRACTURE;  Surgeon: Saundra Curl, MD;  Location: [facility] OR;  Service: Orthopedics;  Laterality: Left;   SACRO-ILIAC PINNING Left 07/01/2020   Procedure: Eino Gravel;  Surgeon: Hardy Lia, MD;  Location: [facility] OR;  Service: Orthopedics;  Laterality: Left;   SLEEVE GASTROPLASTY  01/18/2017   SUBOCCIPITAL CRANIECTOMY CERVICAL LAMINECTOMY N/A 06/09/2016   Procedure: Chiari Decompression;  Surgeon: Augusto Blonder, MD;  Location: [facility] NEURO ORS;  Service: Neurosurgery;  Laterality: N/A;  posterior/occipital   TEAR DUCT PROBING  as a child   TONSILLECTOMY     as a teenager   TUBAL LIGATION Bilateral 09/10/2015   Procedure: POST PARTUM TUBAL LIGATION;  Surgeon: Verlyn Goad, MD;  Location: [facility] ORS;  Service: Gynecology;  Laterality: Bilateral;   WISDOM TOOTH EXTRACTION     as a teenager    SOCIAL HISTORY: Social History   Socioeconomic History   Marital status: Single    Spouse name: CHRISTOPHER   Number of children: 1   Years of education: 12   Highest education level: Not on file  Occupational History   Occupation: HOMEMAKER  Tobacco Use   Smoking status: Never   Smokeless tobacco: Never  Vaping Use   Vaping status: Never Used  Substance and Sexual Activity   Alcohol use: No    Alcohol/week: 0.0 standard drinks of alcohol   Drug  use: Not Currently   Sexual activity: Yes    Partners: Male    Birth control/protection: Surgical  Other Topics Concern   Not on file  Social History Narrative   3-4 cups of tea and soda a day    Social Drivers of Health   Financial Resource Strain: Not on File (06/05/2022)   Received from [employer], [employer]    Financial Resource Strain: 0  Food Insecurity: Low Risk  (06/29/2023)   Received from [facility]   Hunger Vital Sign    Worried About Running Out of Food in the Last Year: Never true    Ran Out of Food in the Last Year: Never true  Transportation Needs: Not on file (06/29/2023)  Physical Activity: Not on File (06/05/2022)   Received from [place], [place]   Physical Activity    Physical Activity: 0  Stress: Not on File (06/05/2022)   Received from [facility], [place]   Stress    Stress: 0  Social Connections: Not on File (08/19/2023)   Received from [facility]   Social Connections    Connectedness: 0  Intimate Partner Violence: Unknown (03/17/2022)   Received from [employer], [facility]   HITS    Physically Hurt: Not on file    Insult or Talk Down To: Not on file    Threaten Physical Harm: Not on file    Scream or Curse: Not on file    FAMILY HISTORY: Family History  Problem Relation Age of Onset   Learning disabilities Other    Colon cancer Maternal Grandmother    Cancer Maternal Grandmother        colon   Asthma Brother    Learning disabilities Brother    Asthma Daughter    Learning disabilities Daughter    Seizures Daughter    Chromosomal disorder Daughter        1q21.1 microdeletion   Cataracts Daughter        congenital   Anesthesia problems Neg Hx    Other Neg Hx     ALLERGIES:  is allergic to metoclopramide , ondansetron , and aspirin .  MEDICATIONS:  Current Outpatient Medications  Medication Sig Dispense Refill   acetaminophen  (TYLENOL ) 500 MG tablet Take 2 tablets (1,000 mg total) by mouth every 6 (six) hours. 30 tablet 0   ARIPiprazole (ABILIFY) 5 MG tablet Take 5 mg by mouth daily.     celecoxib  (CELEBREX ) 200 MG capsule Take 1 capsule (200 mg total) by mouth 2 (two) times daily. 20 capsule 0   cephALEXin  (KEFLEX ) 500 MG capsule Take 1 capsule (500 mg total) by mouth 4 (four) times daily. 20 capsule 0   cyclobenzaprine  (FLEXERIL ) 10 MG  tablet Take 1 tablet (10 mg total) by mouth 2 (two) times daily as needed for muscle spasms. 20 tablet 0   ibuprofen  (ADVIL ) 200 MG tablet Take 200-800 mg by mouth every 6 (six) hours as needed for fever or moderate pain.     methylPREDNISolone  (MEDROL  DOSEPAK) 4 MG TBPK tablet Follow package insert 21 each 0   oxyCODONE  (ROXICODONE ) 5 MG immediate release tablet Take 1 tablet (5 mg total) by mouth every 4 (four) hours as needed for up to 15 doses for breakthrough pain. 15 tablet 0   oxyCODONE -acetaminophen  (PERCOCET/ROXICET) 5-325 MG tablet Take 1 tablet by mouth every 6 (six) hours as needed for severe pain (pain score 7-10). 10 tablet 0   pregabalin (LYRICA) 100 MG capsule Take 100 mg by mouth every 8 (  eight) hours.     QUEtiapine (SEROQUEL) 25 MG tablet See admin instructions. Take 1/2 tablet at bedtime for sleep for 4 nights,then can increase to 1 tablet at bedtime     tiaGABine (GABITRIL) 4 MG tablet See admin instructions. Take 1/2 tablet twice daily for 7 days,if needed & tolerated increase to 1 tablet twice daily     No current facility-administered medications for this visit.     PHYSICAL EXAMINATION: ECOG PERFORMANCE STATUS: 0 - Asymptomatic  There were no vitals filed for this visit.   There were no vitals filed for this visit.    GENERAL:alert, no distress and comfortable SKIN: skin pallor noted. LYMPH:  no palpable lymphadenopathy in the cervical, axillary LUNGS: clear to auscultation and percussion with normal breathing effort HEART: regular rate & rhythm and no murmurs and no lower extremity edema ABDOMEN:abdomen soft, non-tender and normal bowel sounds Musculoskeletal:no cyanosis of digits and no clubbing  PSYCH: alert & oriented x 3 with fluent speech NEURO: no focal motor/sensory deficits  LABORATORY DATA:  I have reviewed the data as listed Lab Results  Component Value Date   WBC 7.1 02/10/2024   HGB 13.4 02/10/2024   HCT 41.4 02/10/2024   MCV 92.4  02/10/2024   PLT 310 02/10/2024     Chemistry      Component Value Date/Time   NA 139 02/10/2024 1128   NA 141 05/06/2018 1145   K 3.5 02/10/2024 1128   CL 105 02/10/2024 1128   CO2 27 02/10/2024 1128   BUN 8 02/10/2024 1128   BUN 8 05/06/2018 1145   CREATININE 0.56 02/10/2024 1128   CREATININE 0.61 09/22/2023 0848   CREATININE 0.36 (L) 04/01/2015 1356      Component Value Date/Time   CALCIUM  8.4 (L) 02/10/2024 1128   ALKPHOS 63 02/10/2024 1128   AST 22 02/10/2024 1128   AST 16 09/22/2023 0848   ALT 16 02/10/2024 1128   ALT 15 09/22/2023 0848   BILITOT 0.9 02/10/2024 1128   BILITOT 0.5 09/22/2023 0848       RADIOGRAPHIC STUDIES: I have personally reviewed the radiological images as listed and agreed with the findings in the report. MR ABDOMEN WWO CONTRAST Result Date: 03/03/2024 CLINICAL DATA:  Indeterminate liver lesion on recent CT. EXAM: MRI ABDOMEN WITHOUT AND WITH CONTRAST TECHNIQUE: Multiplanar multisequence MR imaging of the abdomen was performed both before and after the administration of intravenous contrast. CONTRAST:  7 mL Vueway  COMPARISON:  CT on 02/10/2024, and MRI on 12/06/2017 FINDINGS: Lower chest: No acute findings. Hepatobiliary: Chemical shift imaging shows focal fatty infiltration in the left hepatic lobe centered on the falciform ligament, which corresponds to the hypoattenuating area seen on recent CT. Two masses are again seen in the right hepatic lobe, measuring 3.5 x 2.7 cm and 1.7 x 1.0 cm, both showing mild increase in size since 2018. Both masses show near T2 isointensity, arterial phase hyperenhancement with lack of contrast washout, and central scar, consistent with benign focal nodular hyperplasia. Prior cholecystectomy. No evidence of biliary obstruction. Pancreas:  No mass or inflammatory changes. Spleen:  Within normal limits in size and appearance. Adrenals/Urinary Tract: No suspicious masses identified. No evidence of hydronephrosis.  Stomach/Bowel: Unremarkable. Vascular/Lymphatic: No pathologically enlarged lymph nodes identified. No acute vascular findings. Other:  None. Musculoskeletal:  No suspicious bone lesions identified. IMPRESSION: Focal fatty infiltration in left hepatic lobe, which corresponds to the hypoattenuating area seen on recent CT. Two hypervascular masses again seen in right hepatic lobe, consistent  with benign focal nodular hyperplasia. Electronically Signed   By: Marlyce Sine M.D.   On: 03/03/2024 11:36    All questions were answered. The patient knows to call the clinic with any problems, questions or concerns. I spent 30 minutes in the care of this patient including H and P, review of records, counseling and coordination of care.     Murleen Arms, MD 03/31/2024 9:22 AM

## 2024-11-12 ENCOUNTER — Emergency Department (HOSPITAL_BASED_OUTPATIENT_CLINIC_OR_DEPARTMENT_OTHER)
Admission: EM | Admit: 2024-11-12 | Discharge: 2024-11-12 | Disposition: A | Discharge status: Home / Self Care | Payer: MEDICAID | Attending: Emergency Medicine | Admitting: Emergency Medicine

## 2024-11-12 ENCOUNTER — Encounter (HOSPITAL_BASED_OUTPATIENT_CLINIC_OR_DEPARTMENT_OTHER): Payer: Self-pay

## 2024-11-12 ENCOUNTER — Other Ambulatory Visit: Payer: Self-pay

## 2024-11-12 DIAGNOSIS — J069 Acute upper respiratory infection, unspecified: Secondary | ICD-10-CM | POA: Diagnosis not present

## 2024-11-12 DIAGNOSIS — R059 Cough, unspecified: Secondary | ICD-10-CM | POA: Diagnosis present

## 2024-11-12 LAB — RESP PANEL BY RT-PCR (RSV, FLU A&B, COVID)  RVPGX2
Influenza A by PCR: NEGATIVE
Influenza B by PCR: NEGATIVE
Resp Syncytial Virus by PCR: NEGATIVE
SARS Coronavirus 2 by RT PCR: NEGATIVE

## 2024-11-12 NOTE — Discharge Instructions (Signed)
 As we discussed, take Tylenol  and/or ibuprofen  for comfort. Push fluids. Get lots of rest.

## 2024-11-12 NOTE — ED Provider Notes (Signed)
 [place] [facility] Provider Note   CSN: [id number] Arrival date & time: 11/12/24  8365     Patient presents with: Cough   Brenda Spencer is a 33 y.o. female.   Patient to ED with symptoms of sore throat, nasal congestion, dry cough and frontal headache for the past 2-3 days. No fever, vomiting, diarrhea. Normal appetite. No known sick contacts.   The history is provided by the patient. No language interpreter was used.  Cough      Prior to Admission medications   Medication Sig Start Date End Date Taking? Authorizing Provider  acetaminophen  (TYLENOL ) 500 MG tablet Take 2 tablets (1,000 mg total) by mouth every 6 (six) hours. 07/08/20   Tammy Sor, PA-C  ARIPiprazole (ABILIFY) 5 MG tablet Take 5 mg by mouth daily. 06/06/22   [provider]  celecoxib  (CELEBREX ) 200 MG capsule Take 1 capsule (200 mg total) by mouth 2 (two) times daily. 09/28/21   Harris, Abigail, PA-C  cephALEXin  (KEFLEX ) 500 MG capsule Take 1 capsule (500 mg total) by mouth 4 (four) times daily. 05/27/23   Franklyn Sid SAILOR, MD  cyclobenzaprine  (FLEXERIL ) 10 MG tablet Take 1 tablet (10 mg total) by mouth 2 (two) times daily as needed for muscle spasms. 02/10/24   Doretha Folks, MD  ibuprofen  (ADVIL ) 200 MG tablet Take 200-800 mg by mouth every 6 (six) hours as needed for fever or moderate pain.    [provider]  methylPREDNISolone  (MEDROL  DOSEPAK) 4 MG TBPK tablet Follow package insert 07/01/22   Curatolo, Adam, DO  oxyCODONE  (ROXICODONE ) 5 MG immediate release tablet Take 1 tablet (5 mg total) by mouth every 4 (four) hours as needed for up to 15 doses for breakthrough pain. 07/01/22   Curatolo, Adam, DO  oxyCODONE -acetaminophen  (PERCOCET/ROXICET) 5-325 MG tablet Take 1 tablet by mouth every 6 (six) hours as needed for severe pain (pain score 7-10). 02/10/24   Doretha Folks, MD  pregabalin (LYRICA) 100 MG capsule Take 100 mg by mouth every 8 (eight)  hours. 01/26/22   [provider]  QUEtiapine (SEROQUEL) 25 MG tablet See admin instructions. Take 1/2 tablet at bedtime for sleep for 4 nights,then can increase to 1 tablet at bedtime    [provider]  tiaGABine (GABITRIL) 4 MG tablet See admin instructions. Take 1/2 tablet twice daily for 7 days,if needed & tolerated increase to 1 tablet twice daily    [provider]    Allergies: Metoclopramide , Ondansetron , and Aspirin     Review of Systems  Respiratory:  Positive for cough.     Updated Vital Signs BP 111/86 (BP Location: Left Arm)   Pulse (!) 111   Temp 98.5 F (36.9 C) (Oral)   Resp 16   Ht 5' (1.524 m)   Wt 59 kg   SpO2 100%   BMI 25.39 kg/m   Physical Exam Vitals and nursing note reviewed.  Constitutional:      Appearance: She is well-developed.  HENT:     Head: Normocephalic.     Nose: Nose normal.     Mouth/Throat:     Mouth: Mucous membranes are moist.  Eyes:     Conjunctiva/sclera: Conjunctivae normal.  Cardiovascular:     Rate and Rhythm: Normal rate and regular rhythm.     Heart sounds: No murmur heard. Pulmonary:     Effort: Pulmonary effort is normal.     Breath sounds: Normal breath sounds. No wheezing, rhonchi or rales.  Abdominal:  Palpations: Abdomen is soft.     Tenderness: There is no abdominal tenderness. There is no guarding or rebound.  Musculoskeletal:        General: Normal range of motion.     Cervical back: Normal range of motion and neck supple.  Lymphadenopathy:     Cervical: No cervical adenopathy.  Skin:    General: Skin is warm and dry.  Neurological:     General: No focal deficit present.     Mental Status: She is alert and oriented to person, place, and time.     (all labs ordered are listed, but only abnormal results are displayed) Labs Reviewed  RESP PANEL BY RT-PCR (RSV, FLU A&B, COVID)  [id number]    EKG: None  Radiology: No results found.   Procedures   Medications Ordered in the  ED - No data to display  Clinical Course as of 11/12/24 1743  Sun Nov 12, 2024  1738 Patient to ED with symptoms of afebrile URI. She is well appearing. VSS. Mildly tachycardic, similar in the past. No ss/sxs of PE, PNA. Viral panel negative. She is requesting a note for work. Discussed supportive care.  [SU]    Clinical Course User Index [SU] Odell Balls, PA-C                                 Medical Decision Making       Final diagnoses:  Viral URI with cough    ED Discharge Orders     None          Odell Balls, PA-C 11/12/24 1744    Towana Ozell BROCKS, MD 11/13/24 [phone]

## 2024-11-12 NOTE — ED Triage Notes (Signed)
Cough, congestion, headache x 1 day
